# Patient Record
Sex: Female | Born: 1940
Health system: Southern US, Community
[De-identification: ages and names within clinical notes are randomized; demographics above are authoritative.]

## PROBLEM LIST (undated history)

## (undated) DIAGNOSIS — R9089 Other abnormal findings on diagnostic imaging of central nervous system: Secondary | ICD-10-CM

## (undated) DIAGNOSIS — D649 Anemia, unspecified: Secondary | ICD-10-CM

## (undated) DIAGNOSIS — C50919 Malignant neoplasm of unspecified site of unspecified female breast: Secondary | ICD-10-CM

## (undated) DIAGNOSIS — R7309 Other abnormal glucose: Secondary | ICD-10-CM

## (undated) DIAGNOSIS — I10 Essential (primary) hypertension: Secondary | ICD-10-CM

## (undated) DIAGNOSIS — I639 Cerebral infarction, unspecified: Secondary | ICD-10-CM

## (undated) DIAGNOSIS — I714 Abdominal aortic aneurysm, without rupture, unspecified: Secondary | ICD-10-CM

## (undated) DIAGNOSIS — R011 Cardiac murmur, unspecified: Secondary | ICD-10-CM

## (undated) DIAGNOSIS — E785 Hyperlipidemia, unspecified: Secondary | ICD-10-CM

## (undated) DIAGNOSIS — M6281 Muscle weakness (generalized): Secondary | ICD-10-CM

## (undated) DIAGNOSIS — R031 Nonspecific low blood-pressure reading: Secondary | ICD-10-CM

## (undated) DIAGNOSIS — C50911 Malignant neoplasm of unspecified site of right female breast: Secondary | ICD-10-CM

## (undated) HISTORY — DX: Hypocalcemia: E83.51

## (undated) HISTORY — PX: ABDOMINAL AORTIC ANEURYSM REPAIR: SUR1152

## (undated) HISTORY — DX: Anemia, unspecified: D64.9

## (undated) HISTORY — DX: Muscle weakness (generalized): M62.81

## (undated) HISTORY — DX: Cardiac murmur, unspecified: R01.1

## (undated) HISTORY — DX: Other abnormal glucose: R73.09

## (undated) HISTORY — DX: Malignant neoplasm of unspecified site of unspecified female breast: C50.919

## (undated) HISTORY — DX: Essential (primary) hypertension: I10

## (undated) HISTORY — DX: Other abnormal findings on diagnostic imaging of central nervous system: R90.89

## (undated) HISTORY — DX: Malignant neoplasm of unspecified site of right female breast: C50.911

## (undated) HISTORY — DX: Hyperlipidemia, unspecified: E78.5

## (undated) HISTORY — DX: Abdominal aortic aneurysm, without rupture, unspecified: I71.40

## (undated) HISTORY — DX: Nonspecific low blood-pressure reading: R03.1

---

## 2016-12-31 ENCOUNTER — Emergency Department (HOSPITAL_COMMUNITY): Payer: Medicare Other

## 2016-12-31 ENCOUNTER — Inpatient Hospital Stay (HOSPITAL_COMMUNITY)
Admission: EM | Admit: 2016-12-31 | Discharge: 2017-01-02 | DRG: 064 | Disposition: A | Discharge status: Home Health | Payer: Medicare Other | Attending: Internal Medicine | Admitting: Internal Medicine

## 2016-12-31 ENCOUNTER — Encounter (HOSPITAL_COMMUNITY): Payer: Self-pay

## 2016-12-31 ENCOUNTER — Other Ambulatory Visit: Payer: Self-pay

## 2016-12-31 DIAGNOSIS — I361 Nonrheumatic tricuspid (valve) insufficiency: Secondary | ICD-10-CM | POA: Diagnosis not present

## 2016-12-31 DIAGNOSIS — D329 Benign neoplasm of meninges, unspecified: Secondary | ICD-10-CM | POA: Diagnosis not present

## 2016-12-31 DIAGNOSIS — I639 Cerebral infarction, unspecified: Secondary | ICD-10-CM | POA: Diagnosis present

## 2016-12-31 DIAGNOSIS — G936 Cerebral edema: Secondary | ICD-10-CM | POA: Diagnosis present

## 2016-12-31 DIAGNOSIS — E785 Hyperlipidemia, unspecified: Secondary | ICD-10-CM

## 2016-12-31 DIAGNOSIS — I10 Essential (primary) hypertension: Secondary | ICD-10-CM | POA: Diagnosis present

## 2016-12-31 DIAGNOSIS — I6381 Other cerebral infarction due to occlusion or stenosis of small artery: Principal | ICD-10-CM | POA: Diagnosis present

## 2016-12-31 DIAGNOSIS — I63031 Cerebral infarction due to thrombosis of right carotid artery: Secondary | ICD-10-CM | POA: Diagnosis not present

## 2016-12-31 DIAGNOSIS — E876 Hypokalemia: Secondary | ICD-10-CM | POA: Diagnosis present

## 2016-12-31 DIAGNOSIS — R29701 NIHSS score 1: Secondary | ICD-10-CM | POA: Diagnosis present

## 2016-12-31 DIAGNOSIS — L309 Dermatitis, unspecified: Secondary | ICD-10-CM | POA: Diagnosis present

## 2016-12-31 DIAGNOSIS — J302 Other seasonal allergic rhinitis: Secondary | ICD-10-CM | POA: Diagnosis not present

## 2016-12-31 DIAGNOSIS — I611 Nontraumatic intracerebral hemorrhage in hemisphere, cortical: Secondary | ICD-10-CM | POA: Diagnosis present

## 2016-12-31 DIAGNOSIS — Z79899 Other long term (current) drug therapy: Secondary | ICD-10-CM | POA: Diagnosis not present

## 2016-12-31 DIAGNOSIS — F17210 Nicotine dependence, cigarettes, uncomplicated: Secondary | ICD-10-CM | POA: Diagnosis present

## 2016-12-31 DIAGNOSIS — R9089 Other abnormal findings on diagnostic imaging of central nervous system: Secondary | ICD-10-CM

## 2016-12-31 HISTORY — DX: Essential (primary) hypertension: I10

## 2016-12-31 LAB — URINALYSIS, ROUTINE W REFLEX MICROSCOPIC
Bilirubin Urine: NEGATIVE
Glucose, UA: NEGATIVE mg/dL
Hgb urine dipstick: NEGATIVE
Ketones, ur: NEGATIVE mg/dL
Nitrite: NEGATIVE
Protein, ur: NEGATIVE mg/dL
Specific Gravity, Urine: 1.003 — ABNORMAL LOW (ref 1.005–1.030)
pH: 6 (ref 5.0–8.0)

## 2016-12-31 LAB — CBC
HEMATOCRIT: 38.5 % (ref 36.0–46.0)
Hemoglobin: 12.8 g/dL (ref 12.0–15.0)
MCH: 30.2 pg (ref 26.0–34.0)
MCHC: 33.2 g/dL (ref 30.0–36.0)
MCV: 90.8 fL (ref 78.0–100.0)
Platelets: 343 10*3/uL (ref 150–400)
RBC: 4.24 MIL/uL (ref 3.87–5.11)
RDW: 12.9 % (ref 11.5–15.5)
WBC: 6.1 10*3/uL (ref 4.0–10.5)

## 2016-12-31 LAB — BASIC METABOLIC PANEL
Anion gap: 9 (ref 5–15)
BUN: 13 mg/dL (ref 6–20)
CO2: 26 mmol/L (ref 22–32)
Calcium: 8.9 mg/dL (ref 8.9–10.3)
Chloride: 102 mmol/L (ref 101–111)
Creatinine, Ser: 0.68 mg/dL (ref 0.44–1.00)
GFR calc Af Amer: >60 mL/min (ref >60)
GFR calc non Af Amer: >60 mL/min (ref >60)
Glucose, Bld: 107 mg/dL — ABNORMAL HIGH (ref 65–99)
Potassium: 3.5 mmol/L (ref 3.5–5.1)
Sodium: 137 mmol/L (ref 135–145)

## 2016-12-31 LAB — CBC WITH DIFFERENTIAL/PLATELET
Basophils Absolute: 0 10*3/uL (ref 0.0–0.1)
Basophils Relative: 0 %
Eosinophils Absolute: 0.4 10*3/uL (ref 0.0–0.7)
Eosinophils Relative: 6 %
HCT: 43.1 % (ref 36.0–46.0)
Hemoglobin: 14.6 g/dL (ref 12.0–15.0)
Lymphocytes Relative: 19 %
Lymphs Abs: 1.4 10*3/uL (ref 0.7–4.0)
MCH: 31 pg (ref 26.0–34.0)
MCHC: 33.9 g/dL (ref 30.0–36.0)
MCV: 91.5 fL (ref 78.0–100.0)
Monocytes Absolute: 0.6 10*3/uL (ref 0.1–1.0)
Monocytes Relative: 8 %
Neutro Abs: 5 10*3/uL (ref 1.7–7.7)
Neutrophils Relative %: 67 %
Platelets: 369 10*3/uL (ref 150–400)
RBC: 4.71 MIL/uL (ref 3.87–5.11)
RDW: 13.1 % (ref 11.5–15.5)
WBC: 7.5 10*3/uL (ref 4.0–10.5)

## 2016-12-31 LAB — CREATININE, SERUM
CREATININE: 0.63 mg/dL (ref 0.44–1.00)
GFR calc Af Amer: >60 mL/min (ref >60)

## 2016-12-31 MED ORDER — LABETALOL HCL 5 MG/ML IV SOLN
20.0000 mg | Freq: Once | INTRAVENOUS | Status: AC
  Administered 2016-12-31: 20 mg via INTRAVENOUS
  Filled 2016-12-31: qty 4

## 2016-12-31 MED ORDER — STROKE: EARLY STAGES OF RECOVERY BOOK
Freq: Once | Status: AC
  Administered 2016-12-31 (×2)
  Filled 2016-12-31: qty 1

## 2016-12-31 MED ORDER — ASPIRIN 300 MG RE SUPP: 300.0000 mg | Freq: Every day | RECTAL | Status: DC

## 2016-12-31 MED ORDER — ACETAMINOPHEN 160 MG/5ML PO SOLN: 650.0000 mg | ORAL | Status: DC | PRN

## 2016-12-31 MED ORDER — DIPHENHYDRAMINE HCL 50 MG/ML IJ SOLN
25.0000 mg | Freq: Once | INTRAMUSCULAR | Status: AC
  Administered 2016-12-31: 25 mg via INTRAVENOUS
  Filled 2016-12-31: qty 1

## 2016-12-31 MED ORDER — HYDRALAZINE HCL 20 MG/ML IJ SOLN
10.0000 mg | INTRAMUSCULAR | Status: DC | PRN
  Administered 2017-01-01 – 2017-01-02 (×2): 10 mg via INTRAVENOUS
  Filled 2016-12-31 (×2): qty 1

## 2016-12-31 MED ORDER — DIPHENHYDRAMINE HCL 25 MG PO CAPS
25.0000 mg | ORAL_CAPSULE | Freq: Every day | ORAL | Status: DC
  Administered 2016-12-31 – 2017-01-01 (×2): 25 mg via ORAL
  Filled 2016-12-31 (×2): qty 1

## 2016-12-31 MED ORDER — ACETAMINOPHEN 325 MG PO TABS: 650.0000 mg | ORAL_TABLET | ORAL | Status: DC | PRN

## 2016-12-31 MED ORDER — SODIUM CHLORIDE 0.9 % IV SOLN
INTRAVENOUS | Status: DC
  Administered 2016-12-31: 16:00:00 via INTRAVENOUS
  Administered 2017-01-01: 1000 mL via INTRAVENOUS

## 2016-12-31 MED ORDER — ACETAMINOPHEN 650 MG RE SUPP: 650.0000 mg | RECTAL | Status: DC | PRN

## 2016-12-31 MED ORDER — ASPIRIN 325 MG PO TABS
325.0000 mg | ORAL_TABLET | Freq: Every day | ORAL | Status: DC
  Administered 2016-12-31: 325 mg via ORAL
  Filled 2016-12-31: qty 1

## 2016-12-31 MED ORDER — LORATADINE 10 MG PO TABS
10.0000 mg | ORAL_TABLET | Freq: Every day | ORAL | Status: DC
  Administered 2017-01-01 – 2017-01-02 (×2): 10 mg via ORAL
  Filled 2016-12-31 (×2): qty 1

## 2016-12-31 MED ORDER — LORAZEPAM 2 MG/ML IJ SOLN
1.0000 mg | Freq: Once | INTRAMUSCULAR | Status: AC
  Administered 2016-12-31: 1 mg via INTRAVENOUS
  Filled 2016-12-31: qty 1

## 2016-12-31 MED ORDER — GADOBENATE DIMEGLUMINE 529 MG/ML IV SOLN
15.0000 mL | Freq: Once | INTRAVENOUS | Status: AC | PRN
  Administered 2016-12-31: 11 mL via INTRAVENOUS

## 2016-12-31 NOTE — ED Notes (Addendum)
ED Provider at bedside. NOT A CODE STROKE

## 2016-12-31 NOTE — ED Notes (Signed)
[place] room [phone]

## 2016-12-31 NOTE — ED Triage Notes (Addendum)
Patient c/o intermittent numbness of the left side from head to left foot and beeing "off- balanced.". Patient states numbness started Saturday and has been getting progressively worse. Patient called her physician and was told to come to the ED.

## 2016-12-31 NOTE — ED Notes (Signed)
PT STATES LEFT SIDE "I FEEL MORE NOW ON THIS SIDE THAN EARLIER" "EVERYTHING IS LESS DULL" " I CAN ACTUALLY FEEL MY EAR"

## 2016-12-31 NOTE — Consult Note (Addendum)
Requesting Physician: Dr. Wilson Singer    Chief Complaint:  Left side  numbness  History obtained from:   Patient and Chart     HPI:                                                                                                                                       Elizabeth Chase is an 76 y.o. female with PMH of HTN presented with left-sided numbness since yesterday. She presented to [facility] emergency room where CT head showed no area of hemorrhage in the right frontal lobe and a hypodensity in the right thalamus. MRI brain confirmed acute infarct in the right thalamus, measuring 10 mm with possible small acute infarct posterior limb internal capsule on the right, hyperintensity in the subcortical white matter of the right posterior frontal lobe with a small amount of hemorrhage. Initial blood pressure was 979/89 systolic.  Patient was transferred to [facility] for further stroke workup. Neurology was consulted.       Date last known well: 11.18.18 Time last known well:  tPA Given: no, hemorrhage, outiside window  NIHSS: 1 Baseline MRS 0 ICH score 0  Past Medical History:  Diagnosis Date  . Hypertension     History reviewed. No pertinent surgical history.  Family History  Problem Relation Age of Onset  . Diabetes Mother   . Cancer Father    Social History:  reports that she has been smoking cigarettes.  She has been smoking about 1.00 pack per day. she has never used smokeless tobacco. She reports that she does not drink alcohol or use drugs.  Allergies:  Allergies  Allergen Reactions  . Bee Venom     Medications:                                                                                                                        I reviewed home medications.   ROS:  14 systems reviewed and negative except above. 15 pound weight loss  in past few months   Examination:                                                                                                      General: Appears well-developed and well-nourished.  Psych: Affect appropriate to situation Eyes: No scleral injection HENT: No OP obstrucion Head: Normocephalic.  Cardiovascular: Normal rate and regular rhythm.  Respiratory: Effort normal and breath sounds normal to anterior ascultation GI: Soft.  No distension. There is no tenderness.  Skin: WDI   Neurological Examination Mental Status: Alert, oriented, thought content appropriate.  Speech fluent without evidence of aphasia.  Able to follow 3 step commands without difficulty. Cranial Nerves: II: Discs flat bilaterally; Visual fields grossly normal,  III,IV, VI: ptosis not present, extra-ocular motions intact bilaterally, pupils equal, round, reactive to light and accommodation V,VII: smile symmetric, facial light touch sensation normal bilaterally VIII: hearing normal bilaterally IX,X: uvula rises symmetrically XI: bilateral shoulder shrug XII: midline tongue extension Motor: Right : Upper extremity   5/5    Left:     Upper extremity   5/5  Lower extremity   5/5     Lower extremity   5/5 Tone and bulk:normal tone throughout; no atrophy noted Sensory: reduced sensation to light touch, pinprick on left face arm and leg Deep Tendon Reflexes: 2+ and symmetric throughout Plantars: Right: downgoing   Left: downgoing Cerebellar: normal finger-to-nose, normal rapid alternating movements and normal heel-to-shin test Gait: normal gait and station     Lab Results: Basic Metabolic Panel: Recent Labs  Lab 12/31/16 0907 12/31/16 1847  NA 137  --   K 3.5  --   CL 102  --   CO2 26  --   GLUCOSE 107*  --   BUN 13  --   CREATININE 0.68 0.63  CALCIUM 8.9  --     CBC: Recent Labs  Lab 12/31/16 0907 12/31/16 1847  WBC 7.5 6.1  NEUTROABS 5.0  --   HGB 14.6 12.8  HCT 43.1 38.5  MCV 91.5  90.8  PLT 369 343    Coagulation Studies: No results for input(s): LABPROT, INR in the last 72 hours.  Imaging: Ct Head Wo Contrast  Result Date: 12/31/2016 CLINICAL DATA:  Intermittent left side numbness EXAM: CT HEAD WITHOUT CONTRAST TECHNIQUE: Contiguous axial images were obtained from the base of the skull through the vertex without intravenous contrast. COMPARISON:  None. FINDINGS: Brain: Small hyperdense area noted in the right frontal deep white matter measuring 5 mm concerning for small area of hemorrhage. This is in an area of slight low-density in the deep white matter, possibly petechial hemorrhage within an area of infarct. Low-density areas also noted in the right thalamus compatible with lacunar infarcts. No mass effect or midline shift. No hydrocephalus. Vascular: No hyperdense vessel or unexpected calcification. Skull: No acute calvarial abnormality. Sinuses/Orbits: Visualized paranasal sinuses and mastoids clear. Orbital soft tissues unremarkable. Other: None IMPRESSION: 5 mm hyperdense area within the right frontal white matter concerning for  small petechial hemorrhage, possibly within a small area white matter infarct. Lacunar infarcts also noted in the right thalamus. MRI may be beneficial for further evaluation if felt clinically indicated. These results were called by telephone at the time of interpretation on 12/31/2016 at 9:29 am to Dr. Virgel Manifold , who verbally acknowledged these results. Electronically Signed   By: Rolm Baptise M.D.   On: 12/31/2016 09:32   Mr Brain W And Wo Contrast  Result Date: 12/31/2016 CLINICAL DATA:  Numbness left side EXAM: MRI HEAD WITHOUT AND WITH CONTRAST TECHNIQUE: Multiplanar, multiecho pulse sequences of the brain and surrounding structures were obtained without and with intravenous contrast. CONTRAST:  75mL MULTIHANCE GADOBENATE DIMEGLUMINE 529 MG/ML IV SOLN COMPARISON:  CT head 12/31/2016 FINDINGS: Brain: Acute infarct right thalamus  measuring 10 mm. Possible small acute infarct posterior limb internal capsule on the right Postcontrast imaging is markedly degraded by motion due to claustrophobia. 8 x 10 mm extra-axial enhancing mass in the right parietal region consistent with small meningioma. Hyperintensity in the subcortical white matter of the right posterior frontal lobe with a small amount of hemorrhage. This shows patchy enhancement on the postcontrast sagittal images but not confirmed on other images likely due to motion. This corresponds to the area of hemorrhage on CT. The white matter hyperintensity may be vasogenic edema. No other areas of abnormal enhancement. Generalized atrophy.  Negative for hydrocephalus. Vascular: Normal arterial flow voids Skull and upper cervical spine: Negative Sinuses/Orbits: Mild mucosal edema paranasal sinuses.  Normal orbit Other: None IMPRESSION: Acute infarct right thalamus. Possible small acute infarct posterior limb internal capsule on the right 8 x 10 mm extra-axial enhancing mass right parietal convexity compatible with small meningioma Hyperintensity in the subcortical white matter in the right posterior frontal lobe. This contains hemorrhage on CT and mild enhancement. This could be due to acute hemorrhagic infarction. Metastatic disease possible. Short term follow-up MRI suggested Image quality degraded by motion. There is extensive motion on the postcontrast images. Electronically Signed   By: Franchot Gallo M.D.   On: 12/31/2016 13:15     ASSESSMENT AND PLAN  76 y.o. female with PMH of HTN presented with left-sided numbness. She was found to have  acute infarct in the right thalamus, measuring 10 mm with possible small acute infarct posterior limb internal capsule on the right, hyperintensity in the subcortical white matter of the right posterior frontal lobe with a small amount of hemorrhage.   Acute Ischemic Right thalamic infarct Right frontal Hemorrhage  Recommend # MRI of the  brain with contrast to evaluate for underlying neoplasm given hemorrhage  #MRA Head, carotid US #Transthoracic Echo  # Hold Antiplatelets due to hemorrhage  # Blood cx to rule out endocarditis #Start or continue Atorvastatin 80 mg/other high intensity statin # BP goal: 160 SBP and below  # HBAIC and Lipid profile # Telemetry monitoring # Frequent neuro checks # NPO until passes stroke swallow screen  Please page stroke NP  Or  PA  Or MD from 8am -4 pm  as this patient from this time will be  followed by the stroke.   You can look them up on [email or web address]  Password [facility]   Lunden Mcleish [facility] Pager Number [phone]

## 2016-12-31 NOTE — ED Triage Notes (Signed)
Lisinopril 10mg  started 1 week. Increased this week to 20 mg for increased BP. Pt with generalized red rash. Pt states -Treated for ? scabies in the last two weeks. Numbness started in left palm and increased to left side. Symptoms started Saturday.

## 2016-12-31 NOTE — ED Notes (Addendum)
EDP KOHUT PRESENT WITH PT. AWARE OF STROKE AND ADMISSION TO [facility]. PT TO DISCUSS NEED FOR ANYTHING (MED) BEFORE MRI.

## 2016-12-31 NOTE — ED Notes (Signed)
Attempted to call report to 3W at [facility]. Nurse unable to take report at this time.

## 2016-12-31 NOTE — ED Notes (Signed)
Patient transported to MRI 

## 2016-12-31 NOTE — ED Notes (Signed)
[place] room [phone]

## 2016-12-31 NOTE — ED Notes (Addendum)
ADMISSION MD PAGED. PT PASSED STROKE SWALLOW- AWAITING RESPONSE FOR DIET. AND AWAITING BED AT [facility]. NOTIFIED PHARMACY FOR STROKE PACKAGE

## 2016-12-31 NOTE — ED Notes (Addendum)
CHAR[facility]ACE[facility] RN CALLING HOUSE ACRN INQUIRING BED ASSIGNMENT

## 2016-12-31 NOTE — ED Notes (Signed)
Attempted to call report to [facility] x2

## 2016-12-31 NOTE — ED Provider Notes (Signed)
[place] DEPT Provider Note   CSN: [id number] Arrival date & time: 12/31/16  7591     History   Chief Complaint Chief Complaint  Patient presents with  . Numbness    HPI Elizabeth Chase is a 76 y.o. female.  HPI   76 year old female with left-sided numbness.  On Saturday.  She initially noticed that her left hand felt numb.  This quickly progressed to her entire left side.  She feels numb from the left side of her face only down into her left foot.  This is been relatively stable.  Her daughter feels that she seems a little bit off balance when she walks.  She denies any acute pain.  No visual complaints.  No change in speech.  No weakness.  She was recently started on lisinopril for hypertension after he daughter encouraged her to see someone after going years w/o care.   Past Medical History:  Diagnosis Date  . Hypertension     There are no active problems to display for this patient.   History reviewed. No pertinent surgical history.  OB History    No data available       Home Medications    Prior to Admission medications   Not on File    Family History Family History  Problem Relation Age of Onset  . Diabetes Mother   . Cancer Father     Social History Social History   Tobacco Use  . Smoking status: Current Every Day Smoker    Packs/day: 1.00    Types: Cigarettes  . Smokeless tobacco: Never Used  Substance Use Topics  . Alcohol use: No    Frequency: Never  . Drug use: No     Allergies   Bee venom   Review of Systems Review of Systems  All systems reviewed and negative, other than as noted in HPI.  Physical Exam Updated Vital Signs BP (!) 202/90 (BP Location: Left Arm)   Pulse 88   Temp 98 F (36.7 C) (Oral)   Resp (!) 22   Ht 5\' 5"  (1.651 m)   Wt 52.2 kg (115 lb)   SpO2 98%   BMI 19.14 kg/m   Physical Exam  Constitutional: She is oriented to person, place, and time. She appears well-developed  and well-nourished. No distress.  HENT:  Head: Normocephalic and atraumatic.  Eyes: Conjunctivae are normal. Right eye exhibits no discharge. Left eye exhibits no discharge.  Neck: Neck supple.  Cardiovascular: Normal rate, regular rhythm and normal heart sounds. Exam reveals no gallop and no friction rub.  No murmur heard. Pulmonary/Chest: Effort normal and breath sounds normal. No respiratory distress.  Abdominal: Soft. She exhibits no distension. There is no tenderness.  Musculoskeletal: She exhibits no edema or tenderness.  Neurological: She is alert and oriented to person, place, and time.  Speech clear.  Content appropriate.  Follows commands.  Cranial nerves II through XII are intact.  Strength is 5 out of 5 bilateral upper and lower extremities.  Good finger to nose testing bilaterally.  She is able to stand at bedside with her feet together without apparent difficulty.  Did not ambulate.  Decreased sensation to light touch left face upper extremity, trunk and left lower extremity.  Skin: Skin is warm and dry.  Psychiatric: She has a normal mood and affect. Her behavior is normal. Thought content normal.  Nursing note and vitals reviewed.    ED Treatments / Results  Labs (all labs ordered  are listed, but only abnormal results are displayed) Labs Reviewed  CBC WITH DIFFERENTIAL/PLATELET  BASIC METABOLIC PANEL  URINALYSIS, ROUTINE W REFLEX MICROSCOPIC    EKG  EKG Interpretation  Date/Time:  Monday December 31 2016 08:56:37 EST Ventricular Rate:  86 PR Interval:    QRS Duration: 90 QT Interval:  399 QTC Calculation: 478 R Axis:   -24 Text Interpretation:  Sinus rhythm Atrial premature complex No old tracing to compare Confirmed by Virgel Manifold [phone]) on 12/31/2016 9:06:02 AM       Radiology Ct Head Wo Contrast  Result Date: 12/31/2016 CLINICAL DATA:  Intermittent left side numbness EXAM: CT HEAD WITHOUT CONTRAST TECHNIQUE: Contiguous axial images were obtained  from the base of the skull through the vertex without intravenous contrast. COMPARISON:  None. FINDINGS: Brain: Small hyperdense area noted in the right frontal deep white matter measuring 5 mm concerning for small area of hemorrhage. This is in an area of slight low-density in the deep white matter, possibly petechial hemorrhage within an area of infarct. Low-density areas also noted in the right thalamus compatible with lacunar infarcts. No mass effect or midline shift. No hydrocephalus. Vascular: No hyperdense vessel or unexpected calcification. Skull: No acute calvarial abnormality. Sinuses/Orbits: Visualized paranasal sinuses and mastoids clear. Orbital soft tissues unremarkable. Other: None IMPRESSION: 5 mm hyperdense area within the right frontal white matter concerning for small petechial hemorrhage, possibly within a small area white matter infarct. Lacunar infarcts also noted in the right thalamus. MRI may be beneficial for further evaluation if felt clinically indicated. These results were called by telephone at the time of interpretation on 12/31/2016 at 9:29 am to Dr. Virgel Manifold , who verbally acknowledged these results. Electronically Signed   By: Rolm Baptise M.D.   On: 12/31/2016 09:32    Procedures Procedures (including critical care time)  Medications Ordered in ED Medications - No data to display   Initial Impression / Assessment and Plan / ED Course  I have reviewed the triage vital signs and the nursing notes.  Pertinent labs & imaging results that were available during my care of the patient were reviewed by me and considered in my medical decision making (see chart for details).     Imaging as above. Aspirin deferred because of petechial hemorrhage. Discussed with neurology, MRI with contrast with possibility of malignancy. [facility]   Final Clinical Impressions(s) / ED Diagnoses   Final diagnoses:  Right thalamic infarction [facility])    ED Discharge  Orders    None       Virgel Manifold, MD 01/02/17 [phone]

## 2016-12-31 NOTE — H&P (Signed)
History and Physical    Elizabeth Chase UMP:[id number] DOB: Sep 16, 1940 DOA: 12/31/2016  PCP: System, Pcp Not In   Patient coming from: Home  Chief Complaint: Left sided numbness.  HPI: Elizabeth Chase is a 76 y.o. female with medical history significant of hypertension who presented with left sided numbness. 24 hours ago she felt numbness on her left hand, radiated into her left upper extremity, she went to sleep with this symptoms and by the time she woke up she had complete numbness on her left side including left upper and left lower extremities. The numbness was persistent, severe in intensity, no improving or worsening factors, associated with difficulty ambulating but not weakness. She presented to the hospital for further evaluation.   ED Course: She was workup with head CT which showed acute stroke, neurology was contacted, recommendations to transfer to [facility] for further evaluation.  Review of Systems:  1. General: No fevers, no chills, no weight gain or weight loss 2. ENT: No runny nose or sore throat, no hearing disturbances 3. Pulmonary: No dyspnea, cough, wheezing, or hemoptysis 4. Cardiovascular: No angina, claudication, lower extremity edema, pnd or orthopnea 5. Gastrointestinal: No nausea or vomiting, no diarrhea or constipation 6. Hematology: No easy bruisability or frequent infections 7. Urology: No dysuria, hematuria or increased urinary frequency 8. Dermatology: No rashes. 9. Neurology: No seizures, positive for numbness in the left side and ambulatory dysfunction 10. Musculoskeletal: No joint pain or deformities  Past Medical History:  Diagnosis Date  . Hypertension     History reviewed. No pertinent surgical history.   reports that she has been smoking cigarettes.  She has been smoking about 1.00 pack per day. she has never used smokeless tobacco. She reports that she does not drink alcohol or use drugs.  Allergies  Allergen Reactions  . Bee Venom      Family History  Problem Relation Age of Onset  . Diabetes Mother   . Cancer Father      Prior to Admission medications   Medication Sig Start Date End Date Taking? Authorizing Provider  cetirizine (ZYRTEC) 10 MG tablet Take 10 mg daily by mouth.   Yes [provider]  diphenhydrAMINE (BENADRYL) 25 MG tablet Take 25 mg at bedtime by mouth.   Yes [provider]  lisinopril (PRINIVIL,ZESTRIL) 20 MG tablet Take 20 mg daily by mouth.   Yes [provider]  mometasone (ELOCON) 0.1 % cream Apply 1 application daily as needed topically (itching).   Yes [provider]    Physical Exam: Vitals:   12/31/16 1113 12/31/16 1117 12/31/16 1131 12/31/16 1149  BP: (!) 161/64 (!) 141/59  (!) 170/70  Pulse: (!) 59 68  64  Resp: (!) 27 (!) 23  20  Temp:   97.9 F (36.6 C)   TempSrc:      SpO2: 95% 100%  98%  Weight:      Height:        Constitutional: deconditioned Vitals:   12/31/16 1113 12/31/16 1117 12/31/16 1131 12/31/16 1149  BP: (!) 161/64 (!) 141/59  (!) 170/70  Pulse: (!) 59 68  64  Resp: (!) 27 (!) 23  20  Temp:   97.9 F (36.6 C)   TempSrc:      SpO2: 95% 100%  98%  Weight:      Height:       Eyes: PERRL, lids and conjunctivae pale. ENMT: Mucous membranes are dry. Posterior pharynx clear of any exudate or lesions.Normal  dentition.  Neck: normal, supple, no masses, no thyromegaly Respiratory: clear to auscultation bilaterally, no wheezing, no crackles. Normal respiratory effort. No accessory muscle use.  Cardiovascular: Regular rate and rhythm, no murmurs / rubs / gallops. No extremity edema. 2+ pedal pulses. No carotid bruits.  Abdomen: no tenderness, no masses palpated. No hepatosplenomegaly. Bowel sounds positive.  Musculoskeletal: no clubbing / cyanosis. No joint deformity upper and lower extremities. Good ROM, no contractures. Normal muscle tone.  Skin: no rashes, lesions, ulcers. No induration Neurologic: CN 2-12 grossly intact.  Sensation intact, DTR normal. Strength 5/5 in all 4. Reported numbness on the left side, other sensation is intact.    Labs on Admission: I have personally reviewed following labs and imaging studies  CBC: Recent Labs  Lab 12/31/16 0907  WBC 7.5  NEUTROABS 5.0  HGB 14.6  HCT 43.1  MCV 91.5  PLT 063   Basic Metabolic Panel: Recent Labs  Lab 12/31/16 0907  NA 137  K 3.5  CL 102  CO2 26  GLUCOSE 107*  BUN 13  CREATININE 0.68  CALCIUM 8.9   GFR: Estimated Creatinine Clearance: 48.5 mL/min (by C-G formula based on SCr of 0.68 mg/dL). Liver Function Tests: No results for input(s): AST, ALT, ALKPHOS, BILITOT, PROT, ALBUMIN in the last 168 hours. No results for input(s): LIPASE, AMYLASE in the last 168 hours. No results for input(s): AMMONIA in the last 168 hours. Coagulation Profile: No results for input(s): INR, PROTIME in the last 168 hours. Cardiac Enzymes: No results for input(s): CKTOTAL, CKMB, CKMBINDEX, TROPONINI in the last 168 hours. BNP (last 3 results) No results for input(s): PROBNP in the last 8760 hours. HbA1C: No results for input(s): HGBA1C in the last 72 hours. CBG: No results for input(s): GLUCAP in the last 168 hours. Lipid Profile: No results for input(s): CHOL, HDL, LDLCALC, TRIG, CHOLHDL, LDLDIRECT in the last 72 hours. Thyroid Function Tests: No results for input(s): TSH, T4TOTAL, FREET4, T3FREE, THYROIDAB in the last 72 hours. Anemia Panel: No results for input(s): VITAMINB12, FOLATE, FERRITIN, TIBC, IRON, RETICCTPCT in the last 72 hours. Urine analysis:    Component Value Date/Time   COLORURINE STRAW (A) 12/31/2016 0907   APPEARANCEUR CLEAR 12/31/2016 0907   LABSPEC 1.003 (L) 12/31/2016 0907   PHURINE 6.0 12/31/2016 0907   GLUCOSEU NEGATIVE 12/31/2016 0907   HGBUR NEGATIVE 12/31/2016 0907   BILIRUBINUR NEGATIVE 12/31/2016 0907   KETONESUR NEGATIVE 12/31/2016 0907   PROTEINUR NEGATIVE 12/31/2016 0907   NITRITE NEGATIVE 12/31/2016 0907    LEUKOCYTESUR LARGE (A) 12/31/2016 0907    Radiological Exams on Admission: Ct Head Wo Contrast  Result Date: 12/31/2016 CLINICAL DATA:  Intermittent left side numbness EXAM: CT HEAD WITHOUT CONTRAST TECHNIQUE: Contiguous axial images were obtained from the base of the skull through the vertex without intravenous contrast. COMPARISON:  None. FINDINGS: Brain: Small hyperdense area noted in the right frontal deep white matter measuring 5 mm concerning for small area of hemorrhage. This is in an area of slight low-density in the deep white matter, possibly petechial hemorrhage within an area of infarct. Low-density areas also noted in the right thalamus compatible with lacunar infarcts. No mass effect or midline shift. No hydrocephalus. Vascular: No hyperdense vessel or unexpected calcification. Skull: No acute calvarial abnormality. Sinuses/Orbits: Visualized paranasal sinuses and mastoids clear. Orbital soft tissues unremarkable. Other: None IMPRESSION: 5 mm hyperdense area within the right frontal white matter concerning for small petechial hemorrhage, possibly within a small area white matter infarct. Lacunar infarcts also noted  in the right thalamus. MRI may be beneficial for further evaluation if felt clinically indicated. These results were called by telephone at the time of interpretation on 12/31/2016 at 9:29 am to Dr. Virgel Manifold , who verbally acknowledged these results. Electronically Signed   By: Rolm Baptise M.D.   On: 12/31/2016 09:32    EKG: Independently reviewed. Normal sinus rhythm, rate 86 bpm, normal axis, normal intervals, positive premature atrial complexes.   Assessment/Plan Active Problems:   * No active hospital problems. *   76 year old female who presented with acute focal neurologic deficit consistent with partial paresthesias on her left side, associated with ambulatory dysfunction. On initial physical examination blood pressure 170/70, heart rate 64, respiratory 20,  oxygen saturation 98% on room air, lungs are clear to auscultation bilaterally, heart S1-S2 present and rhythmic, the abdomen is soft nontender, no lower extremity edema. Sodium 137, potassium 3.5, chloride 102, bicarbonate 26, glucose 107, BUN 13, creatinine 0.68, white count 7.5, hemoglobin 14.6, hematocrit 43.1, platelets 369. Uterine analysis negative for infection. Head CT with 5 mm hyperdense area within the right frontal white matter concerning for small petechial hemorrhage, possibly within a small area white matter infarct. Lacunar infarcts noted in the right thalamus. MRI with acute infarct in the right thalamus, measuring 10 mm with possible small acute infarct posterior limb internal capsule on the right, hyperintensity in the subcortical white matter of the right posterior frontal lobe with a small amount of hemorrhage.    Working diagnosis left side paresthesias due to acute CVA with possible hemorrhagic complication.   1. Acute CVA of the right thalamus, complicated by right posterior frontal lobe hyperintensity consistent with small amount of hemorrhage. Patient will be transferred to [facility], she will be placed on a telemetry monitor, will hold any antiplatelet therapy or anticoagulants considering hemorrhagic features on imaging. Neurochecks every 4 hours, physical therapy evaluation, carotid ultrasonography. Case discussed with Dr. Cheral Marker, who recommends transthoracic echocardiography and blood cultures, to rule out embolic phenomena, including infectious process.  2. Hypertension. Patient received labetalol in the emergency department, will hold further antihypertensive agents unless blood pressure greater than 841 systolic in the setting of acute hemorrhagic CVA. Use as needed hydralazine.  3.  Seasonal allergies. Will continue Benadryl and cetrizine.   DVT prophylaxis: scd Code Status: full  Family Communication: I spoke with patient's family at bedside and all questions were  addressed.  Disposition Plan: home  Consults called: Neurology   Admission status: Inpatient.     Morton Simson Gerome Apley MD [facility] Pager [phone]  If 7PM-7AM, please contact night-coverage [email or web address] Password [email or web address]  12/31/2016, 12:32 PM

## 2016-12-31 NOTE — ED Notes (Signed)
[facility] called for transport to [facility]

## 2017-01-01 ENCOUNTER — Inpatient Hospital Stay (HOSPITAL_COMMUNITY): Payer: Medicare Other

## 2017-01-01 DIAGNOSIS — I10 Essential (primary) hypertension: Secondary | ICD-10-CM

## 2017-01-01 DIAGNOSIS — I639 Cerebral infarction, unspecified: Secondary | ICD-10-CM

## 2017-01-01 DIAGNOSIS — I6381 Other cerebral infarction due to occlusion or stenosis of small artery: Secondary | ICD-10-CM | POA: Diagnosis present

## 2017-01-01 HISTORY — DX: Cerebral infarction, unspecified: I63.9

## 2017-01-01 HISTORY — DX: Other cerebral infarction due to occlusion or stenosis of small artery: I63.81

## 2017-01-01 LAB — COMPREHENSIVE METABOLIC PANEL
ALBUMIN: 3.7 g/dL (ref 3.5–5.0)
ALK PHOS: 53 U/L (ref 38–126)
ALT: 12 U/L — ABNORMAL LOW (ref 14–54)
ANION GAP: 5 (ref 5–15)
AST: 19 U/L (ref 15–41)
BUN: 5 mg/dL — ABNORMAL LOW (ref 6–20)
CALCIUM: 8.8 mg/dL — AB (ref 8.9–10.3)
CO2: 29 mmol/L (ref 22–32)
Chloride: 107 mmol/L (ref 101–111)
Creatinine, Ser: 0.66 mg/dL (ref 0.44–1.00)
GFR calc Af Amer: >60 mL/min (ref >60)
GFR calc non Af Amer: >60 mL/min (ref >60)
GLUCOSE: 127 mg/dL — AB (ref 65–99)
POTASSIUM: 3.8 mmol/L (ref 3.5–5.1)
Sodium: 141 mmol/L (ref 135–145)
Total Bilirubin: 0.6 mg/dL (ref 0.3–1.2)
Total Protein: 6.7 g/dL (ref 6.5–8.1)

## 2017-01-01 MED ORDER — ATORVASTATIN CALCIUM 80 MG PO TABS
80.0000 mg | ORAL_TABLET | Freq: Every day | ORAL | Status: DC
  Administered 2017-01-01: 80 mg via ORAL
  Filled 2017-01-01: qty 1

## 2017-01-01 MED ORDER — LABETALOL HCL 5 MG/ML IV SOLN: 10.0000 mg | INTRAVENOUS | Status: DC | PRN

## 2017-01-01 MED ORDER — ASPIRIN EC 325 MG PO TBEC
325.0000 mg | DELAYED_RELEASE_TABLET | Freq: Every day | ORAL | Status: DC
  Administered 2017-01-02: 325 mg via ORAL
  Filled 2017-01-01: qty 1

## 2017-01-01 NOTE — Progress Notes (Signed)
Pt has rash on skin that looks like scabies rash, nurse called infection control, pt placed on orange contact for 24 hrs, MD notified.

## 2017-01-01 NOTE — Progress Notes (Signed)
PROGRESS NOTE  Elizabeth Chase HYW:[id number] DOB: 05-Jun-1940 DOA: 12/31/2016 PCP: System, Pcp Not In  HPI/Recap of past 24 hours: HPI from Dr Sander Radon on 11/19 Elizabeth Chase is a 76 y.o. female with medical history significant of hypertension who presented to [facility] ER with left sided numbness x 1 day on 11/18. Pt felt numb on her left hand, radiated into her left upper extremity, she went to sleep with this symptoms and by the time she woke up she had complete numbness on her left side including left upper and left lower extremities. The numbness was persistent, severe in intensity, no improving or worsening factors, associated with difficulty holding on to things, but not weakness. MRI was done, which showed acute R thalamic infarct complicated by small amount of hemorrhage. Initial BP 170/70. Pt admitted for further management here at [facility].  Today, pt reported feeling well, denied any new complaints. LUE still feels numb and some tingling. Pt denied any chest pain, SOB, abdominal pain, headache, N/V/D/C, fever/chills.  Assessment/Plan: Principal Problem:   Right thalamic infarction [facility])  # Acute CVA of the right thalamus, complicated by right posterior frontal lobe hyperintensity consistent with small amount of hemorrhage LUE numbness and tingling MRI brain/MRA head noted as below MRI head with contrast pending ECHO: pending B/L carotid US: pending Blood cx to r/o endocarditis pending A1c, lipid panel pending Permissive HTN, hold ASA due to hemorrhage on imaging Started lipitor Neurochecks every 4 hours PT/OT/Speech on board Monitor closely  # Right parietal Meningioma- 8X10 mm extra-axial enhancing mass ?? Mets Follow up MRI   # Hypertension Stable Permissive HTN, held home lisinopril IV hydralazine PRN  #Tobacco abuse Smoking cessation advised Nicotine patch given  #Seasonal allergies Continue Benadryl and cetrizine.   Code Status: Full    Family Communication: With patient and son   Disposition Plan: Home once stable   Consultants:  Neurology  Stroke team  Procedures:  None  Antimicrobials:  None  DVT prophylaxis:  SCDs   Objective: Vitals:   12/31/16 2100 12/31/16 2201 01/01/17 0524 01/01/17 1629  BP: (!) 158/70 (!) 196/78 (!) 154/87 (!) 188/82  Pulse: 77 78 81 85  Resp: (!) 31 20 20 19   Temp:  98.3 F (36.8 C) 97.8 F (36.6 C) 98.1 F (36.7 C)  TempSrc:  Oral Oral Oral  SpO2: 96% 96% 97% 98%  Weight:  52.8 kg (116 lb 6.5 oz)    Height:  5\' 5"  (1.651 m)      Intake/Output Summary (Last 24 hours) at 01/01/2017 1716 Last data filed at 01/01/2017 0522 Gross per 24 hour  Intake 830 ml  Output 400 ml  Net 430 ml   Filed Weights   12/31/16 0842 12/31/16 2201  Weight: 52.2 kg (115 lb) 52.8 kg (116 lb 6.5 oz)    Exam:   General:  Awake, alert, oriented X3  Cardiovascular: S1-S2 present, no added hrt sound  Respiratory: Chest clear bilaterally  Abdomen: Soft, non-tender, non-distended, BS present  Musculoskeletal: No bilateral pedal edema present   Neuro: Reduced sensation to light touch on LUE, motor 5/5 on all extremities, speech fluent  Psychiatry: Normal mood  Skin: rash noted on skin, ??scabies    Data Reviewed: CBC: Recent Labs  Lab 12/31/16 0907 12/31/16 1847  WBC 7.5 6.1  NEUTROABS 5.0  --   HGB 14.6 12.8  HCT 43.1 38.5  MCV 91.5 90.8  PLT 369 485   Basic Metabolic Panel: Recent Labs  Lab 12/31/16 0907 12/31/16 1847 01/01/17 0850  NA 137  --  141  K 3.5  --  3.8  CL 102  --  107  CO2 26  --  29  GLUCOSE 107*  --  127*  BUN 13  --  5*  CREATININE 0.68 0.63 0.66  CALCIUM 8.9  --  8.8*   GFR: Estimated Creatinine Clearance: 49.1 mL/min (by C-G formula based on SCr of 0.66 mg/dL). Liver Function Tests: Recent Labs  Lab 01/01/17 0850  AST 19  ALT 12*  ALKPHOS 53  BILITOT 0.6  PROT 6.7  ALBUMIN 3.7   No results for input(s): LIPASE, AMYLASE  in the last 168 hours. No results for input(s): AMMONIA in the last 168 hours. Coagulation Profile: No results for input(s): INR, PROTIME in the last 168 hours. Cardiac Enzymes: No results for input(s): CKTOTAL, CKMB, CKMBINDEX, TROPONINI in the last 168 hours. BNP (last 3 results) No results for input(s): PROBNP in the last 8760 hours. HbA1C: No results for input(s): HGBA1C in the last 72 hours. CBG: No results for input(s): GLUCAP in the last 168 hours. Lipid Profile: No results for input(s): CHOL, HDL, LDLCALC, TRIG, CHOLHDL, LDLDIRECT in the last 72 hours. Thyroid Function Tests: No results for input(s): TSH, T4TOTAL, FREET4, T3FREE, THYROIDAB in the last 72 hours. Anemia Panel: No results for input(s): VITAMINB12, FOLATE, FERRITIN, TIBC, IRON, RETICCTPCT in the last 72 hours. Urine analysis:    Component Value Date/Time   COLORURINE STRAW (A) 12/31/2016 0907   APPEARANCEUR CLEAR 12/31/2016 0907   LABSPEC 1.003 (L) 12/31/2016 0907   PHURINE 6.0 12/31/2016 0907   GLUCOSEU NEGATIVE 12/31/2016 0907   HGBUR NEGATIVE 12/31/2016 0907   BILIRUBINUR NEGATIVE 12/31/2016 [place] 12/31/2016 0907   PROTEINUR NEGATIVE 12/31/2016 0907   NITRITE NEGATIVE 12/31/2016 0907   LEUKOCYTESUR LARGE (A) 12/31/2016 0907   Sepsis Labs: @LABRCNTIP (procalcitonin:4,lacticidven:4)  ) Recent Results (from the past 240 hour(s))  Blood culture (routine x 2)     Status: None (Preliminary result)   Collection Time: 12/31/16 11:36 AM  Result Value Ref Range Status   Specimen Description BLOOD RIGHT FOREARM  Final   Special Requests   Final    BOTTLES DRAWN AEROBIC AND ANAEROBIC Blood Culture adequate volume   Culture   Final    NO GROWTH < 24 HOURS Performed at [facility], [place] [address], [place], [place] [address]    Report Status PENDING  Incomplete  Blood culture (routine x 2)     Status: None (Preliminary result)   Collection Time: 12/31/16 11:36 AM  Result Value Ref  Range Status   Specimen Description BLOOD LEFT ARM  Final   Special Requests   Final    BOTTLES DRAWN AEROBIC AND ANAEROBIC Blood Culture adequate volume   Culture   Final    NO GROWTH < 24 HOURS Performed at [facility], [place] [address], [place], [place] [address]    Report Status PENDING  Incomplete      Studies: Mr Virgel Paling TK Contrast  Result Date: 01/01/2017 CLINICAL DATA:  Stroke follow-up EXAM: MRA HEAD WITHOUT CONTRAST TECHNIQUE: Angiographic images of the Circle of Willis were obtained using MRA technique without intravenous contrast. COMPARISON:  Brain MRI yesterday. FINDINGS: Mild left vertebral artery dominance. Dominant right PICA and left AICA. Small outpouching at the supraclinoid segment left ICA is conical and likely an infundibulum with associated vessel below resolution of MRA. There is a contralateral posterior  communicating artery infundibulum that is also small. No suspected aneurysm. No branch occlusion or beading. IMPRESSION: Negative exam. No stenosis, beading, or embolic source to explain acute infarct. Electronically Signed   By: Monte Fantasia M.D.   On: 01/01/2017 10:50    Scheduled Meds: . atorvastatin  80 mg Oral q1800  . diphenhydrAMINE  25 mg Oral QHS  . loratadine  10 mg Oral Daily    Continuous Infusions: . sodium chloride 1,000 mL (01/01/17 0242)     LOS: 1 day     Alma Friendly, MD [facility]   If 7PM-7AM, please contact night-coverage [email or web address] Password [facility] 01/01/2017, 5:16 PM

## 2017-01-01 NOTE — Progress Notes (Addendum)
STROKE TEAM PROGRESS NOTE   SUBJECTIVE (INTERVAL HISTORY)  Son is at the bedside. Patient is found laying in bed in NAD  Overall she feels her condition is gradually improving. States the numbness in her face has resolved and continues to have mild "tingling" in her Left arm. Voices no new complaints. No new events reported overnight.  OBJECTIVE Lab Results: CBC:  Recent Labs  Lab 12/31/16 0907 12/31/16 1847  WBC 7.5 6.1  HGB 14.6 12.8  HCT 43.1 38.5  MCV 91.5 90.8  PLT 369 343   BMP: Recent Labs  Lab 12/31/16 0907 12/31/16 1847  NA 137  --   K 3.5  --   CL 102  --   CO2 26  --   GLUCOSE 107*  --   BUN 13  --   CREATININE 0.68 0.63  CALCIUM 8.9  --    Urinalysis:  Recent Labs  Lab 12/31/16 0907  COLORURINE STRAW*  APPEARANCEUR CLEAR  LABSPEC 1.003*  PHURINE 6.0  GLUCOSEU NEGATIVE  HGBUR NEGATIVE  BILIRUBINUR NEGATIVE  KETONESUR NEGATIVE  PROTEINUR NEGATIVE  NITRITE NEGATIVE  LEUKOCYTESUR LARGE*   PHYSICAL EXAM Temp:  [97.3 F (36.3 C)-98.3 F (36.8 C)] 97.8 F (36.6 C) (11/20 0524) Pulse Rate:  [59-88] 81 (11/20 0524) Resp:  [15-31] 20 (11/20 0524) BP: (124-202)/(59-104) 154/87 (11/20 0524) SpO2:  [91 %-100 %] 97 % (11/20 0524) Weight:  [52.2 kg (115 lb)-52.8 kg (116 lb 6.5 oz)] 52.8 kg (116 lb 6.5 oz) (11/19 2201) General - Well nourished, well developed, in no apparent distress Respiratory - Lungs clear bilaterally. No wheezing. Cardiovascular - Regular rate and rhythm   Neurological Examination Mental Status: Alert, oriented, thought content appropriate.  Speech fluent without evidence of aphasia.  Able to follow 3 step commands without difficulty. Cranial Nerves: II: Discs flat bilaterally; Visual fields grossly normal,  III,IV, VI: ptosis not present, extra-ocular motions intact bilaterally, pupils equal, round, reactive to light and accommodation V,VII: smile symmetric, facial light touch sensation normal bilaterally VIII: hearing normal  bilaterally IX,X: uvula rises symmetrically XI: bilateral shoulder shrug XII: midline tongue extension Motor: Right :  Upper extremity   5/5                                      Left:     Upper extremity   5/5             Lower extremity   5/5                                                  Lower extremity   5/5 Tone and bulk:normal tone throughout; no atrophy noted Sensory: reduced sensation to light touch, pinprick on left arm Deep Tendon Reflexes: 2+ and symmetric throughout Plantars: Right: downgoing                                Left: downgoing Cerebellar: normal finger-to-nose, normal rapid alternating movements and normal heel-to-shin test Gait: normal gait and station  IMAGING: I have personally reviewed the radiological images below and agree with the radiology interpretations.  Ct Head Wo Contrast Result Date: 12/31/2016 IMPRESSION: 5 mm hyperdense area within the right frontal white  matter concerning for small petechial hemorrhage, possibly within a small area white matter infarct. Lacunar infarcts also noted in the right thalamus.   Mr Jeri Cos And Wo Contrast Result Date: 12/31/2016 IMPRESSION: Acute infarct right thalamus. Possible small acute infarct posterior limb internal capsule on the right 8 x 10 mm extra-axial enhancing mass right parietal convexity compatible with small meningioma Hyperintensity in the subcortical white matter in the right posterior frontal lobe. This contains hemorrhage on CT and mild enhancement. This could be due to acute hemorrhagic infarction. Metastatic disease possible. Short term follow-up MRI suggested Image quality degraded by motion. There is extensive motion on the postcontrast images.    MRIHead                                                           PENDING Echocardiogram: not done                               PENDING B/L Carotid U/S:                                                 PENDING _____________________________________________________________________ ASSESSMENT: Elizabeth Chase is a 76 y.o. female with PMH of prior HTN presented with left-sided numbness since yesterday. She presented to [facility] emergency room where CT head showed no area of hemorrhage in the right frontal lobe and a hypodensity in the right thalamus. MRI brain confirmed acute infarct in the right thalamus,measuring 10 mm with possible small acute infarct posterior limb internal capsule on the right,hyperintensity in the subcortical white matter of the right posterior frontal lobe with a small amount of hemorrhage  Acute infarct in the Right thalamus, possible small Acute infarct Right posterior limb internal capsule Right posterior frontal lobe small amount of hemorrhage.  Suspected Etiology: Likely HTN hemorrhage.  Resultant Symptoms: Left Facial Numbness Stroke Risk Factors: hypertension and smoking Other Stroke Risk Factors: Advanced age,   Outstanding Stroke Work-up Studies: Echocardiogram: not done                                    PENDING B/L Carotid U/S:                                                     PENDING MRI Head                                                                PENDING Blood cx to rule out endocarditis -                          PENDING  PLAN  01/01/2017: HOLD ASA until neuroimaging is stable & without evidence of bleeding Started Atorvastatin 80 mg Ongoing aggressive stroke risk factor management Patient counseled to be compliant with her antithrombotic medications  Right parietal  Meningioma - 8 x 10 mm extra-axial enhancing mass Metastatic disease possible.  Short term follow-up MRI suggested  R/O ENDOCARDITIS: Blood cx to rule out endocarditis - PENDING  HYPERTENSION: Stable- Some elevated B/P's overnight SBP goal less than 140/90  Continue Labetolol PRN Long term BP goal normotensive. May slowly restart home B/P medications after 48  hours Home Meds: Lisinopril  HYPERLIPIDEMIA: No results found for: CHOL, TRIG, HDL, CHOLHDL, VLDL, LDLCALC Home Meds:  NONE LDL  goal < 70 Started on Lipitor to 80 mg daily Continue statin at discharge  R/O DIABETES: No results found for: HGBA1C No results for input(s): GLUCAP in the last 168 hours. HgbA1c goal < 7.0 Currently on:N/A Continue CBG monitoring and SSI DM education   TOBACCO ABUSE Nicoderm Patch PRN Patient counseled to quit   Other Active Problems: Principal Problem:   Right thalamic infarction [facility])  Hospital day # 1  VTE prophylaxis: SCD's  Diet : Diet Heart Room service appropriate? Yes; Fluid consistency: Thin   Prior Home Stroke Medications:  No antithrombotic Prior to Admission Hospital Current Stroke Medications: Lipitor 80 mg Stroke New Meds Plan: Now on No antithrombotic at this time due to hemorrhage Discharge Stroke Meds: Please discharge patient on decision pending, will likely add ASA.  Disposition: Final discharge disposition not confirmed Therapy Recs:  HOME Follow Recs:  System, Pcp Not In Follow up with [facility] in 6 weeks  FAMILY UPDATES: Son family at bedside  TEAM UPDATES: Alma Friendly, MD STATUS:    Start     Ordered   12/31/16 1751  Full code  Continuous     12/31/16 [place], [place] [facility] Neurology Team 01/01/2017 8:32 AM  Attending note:  76 year old female with history of hypertension admitted for left-sided numbness.  MRI showed small right thalamic infarct.  However it also found on MRI of the right frontal white matter abnormal T2 signal with minimal hemorrhage and enhancement, etiology unclear.  DDX including chronic infarct, metastatic tumor, or MS plaque pending, MRI with contrast for further evaluation.  MRI negative.  2D echo and carotid Doppler pending.  LDL and A1c pending.  Patient still likely due to small vessel disease.  She does not take aspirin PTA.  No need to  hold off antiplatelet as the hemorrhage was minimal on MRI.  Will start aspirin and continue Lipitor.  Will follow.  Rosalin Hawking, MD PhD Stroke Neurology 01/01/2017 9:53 PM   To contact Stroke Continuity provider, please refer to [email or web address] After hours, contact General Neurology

## 2017-01-01 NOTE — Evaluation (Signed)
Speech Language Pathology Evaluation Patient Details Name: Elizabeth Chase MRN: [id number] DOB: 01-Apr-1940 Today's Date: 01/01/2017 Time: 0820-0852 SLP Time Calculation (min) (ACUTE ONLY): 32 min  Problem List:  Patient Active Problem List   Diagnosis Date Noted  . Right thalamic infarction [facility]) 01/01/2017   Past Medical History:  Past Medical History:  Diagnosis Date  . Hypertension    Past Surgical History: History reviewed. No pertinent surgical history. HPI:  76 yo female with medical h/o HTN admitted to [facility] with left sided numbness.  Pt found to have right thalamic hypodensity, subcortical white matter right posterior frontal hemorrhage.  Pt reports she lives with her daughter.  She denies speech/cognitive changes with this event.    Assessment / Plan / Recommendation Clinical Impression  MOCA 7.2 administered with pt scoring 23/30 - cut off for normal 26/30.  Pt strengths on testing included spatial, naming, language, abstract thought and orientation.  She did demonstrate difficulty with word recall but recognized words from choice -indicating adequate storage.  No dysarthria or aphasia noted nor focal CN deficits impacting speech.  Son arrived toward end of session and SlP provided pt and son with memory compensation strategies.  Advised to have pt's daughter assure pt is managing her medications, appointments, etc accurately using teach back.  No SLP follow up indicated as pt will have support needed at home and has been educated.     SLP Assessment  SLP Recommendation/Assessment: Patient does not need any further Speech [facility] SLP Visit Diagnosis: Cognitive communication deficit (R41.841)    Follow Up Recommendations  None    Frequency and Duration           SLP Evaluation Cognition  Overall Cognitive Status: (pt with memory deficits per MOCA 7.2) Arousal/Alertness: Awake/alert Orientation Level: Oriented to person;Oriented to place;Oriented to  time;Oriented to situation(x specific date, no calendar in room) Attention: Sustained;Selective Sustained Attention: Appears intact Selective Attention: Appears intact Memory: Impaired Memory Impairment: Retrieval deficit(1/5 words I, 4/5 words with cues) Awareness: Appears intact Problem Solving: Appears intact Safety/Judgment: Appears intact       Comprehension  Auditory Comprehension Overall Auditory Comprehension: Appears within functional limits for tasks assessed Yes/No Questions: Not tested Commands: Within Functional Limits Conversation: Complex Visual Recognition/Discrimination Discrimination: Within Function Limits Reading Comprehension Reading Status: Within funtional limits(for tasks assessed)    Expression Expression Primary Mode of Expression: Verbal Verbal Expression Overall Verbal Expression: Appears within functional limits for tasks assessed Initiation: No impairment Repetition: No impairment Naming: No impairment Pragmatics: No impairment Written Expression Dominant Hand: Right Written Expression: Within Functional Limits   Oral / Motor  Oral Motor/Sensory Function Overall Oral Motor/Sensory Function: Within functional limits Motor Speech Overall Motor Speech: Appears within functional limits for tasks assessed Respiration: Within functional limits Resonance: Within functional limits Articulation: Within functional limitis Intelligibility: Intelligible Motor Planning: Witnin functional limits   GO                    Macario Golds 01/01/2017, 9:55 AM Luanna Salk, [facility] SLP [phone]

## 2017-01-01 NOTE — Progress Notes (Signed)
Patient arrived around 2200 from [facility] ED alert and oriented X 4, some numbness and tingling in left upper and lower extremity arm whose than leg some mild drift and ataxia in left arm, SCD'd are on as well as telemetry, Q2 neuro checks and vital signs completed in [facility] ED. Will continue to monitor.

## 2017-01-01 NOTE — Evaluation (Signed)
Physical Therapy Evaluation Patient Details Name: Elizabeth Chase MRN: [id number] DOB: 24-Apr-1940 Today's Date: 01/01/2017   History of Present Illness  76 yo female with medical h/o HTN admitted to [facility] with left sided numbness.  Pt found to have right thalamic hypodensity, subcortical white matter right posterior frontal hemorrhage.  Pt reports she lives with her daughter.  She denies speech/cognitive changes with this event.    Clinical Impression  Patient evaluated by Physical Therapy with no further acute PT needs identified. All education has been completed and the patient has no further questions. Pt with mild tingling of RUE remaining but has functional use of R hand. Otherwise baseline for functional mobility.  See below for any follow-up Physical Therapy or equipment needs. PT is signing off. Thank you for this referral.     Follow Up Recommendations No PT follow up    Equipment Recommendations  None recommended by PT    Recommendations for Other Services       Precautions / Restrictions Precautions Precautions: None Restrictions Weight Bearing Restrictions: No      Mobility  Bed Mobility Overal bed mobility: Independent                Transfers Overall transfer level: Independent                  Ambulation/Gait Ambulation/Gait assistance: Modified independent (Device/Increase time) Ambulation Distance (Feet): 200 Feet Assistive device: None Gait Pattern/deviations: WFL(Within Functional Limits) Gait velocity: WFL Gait velocity interpretation: at or above normal speed for age/gender General Gait Details: pt began with slow gait but increased speed as she was up longer. Did have imbalance when she looked to the L but was able to self correct with a side step  Stairs Stairs: Yes Stairs assistance: Modified independent (Device/Increase time) Stair Management: One rail Right;Alternating pattern;Forwards Number of Stairs: 10 General stair comments:  no difficulty with alternating pattern. O2 sats 97%, HR 118 bpm after stairs  Wheelchair Mobility    Modified Rankin (Stroke Patients Only) Modified Rankin (Stroke Patients Only) Pre-Morbid Rankin Score: No symptoms Modified Rankin: No significant disability     Balance Overall balance assessment: Modified Independent                               Standardized Balance Assessment Standardized Balance Assessment : Berg Balance Test Berg Balance Test Sit to Stand: Able to stand without using hands and stabilize independently Standing Unsupported: Able to stand safely 2 minutes Sitting with Back Unsupported but Feet Supported on Floor or Stool: Able to sit safely and securely 2 minutes Stand to Sit: Sits safely with minimal use of hands Transfers: Able to transfer safely, minor use of hands Standing Unsupported with Eyes Closed: Able to stand 10 seconds safely Standing Ubsupported with Feet Together: Able to place feet together independently and stand 1 minute safely From Standing, Reach Forward with Outstretched Arm: Can reach forward >12 cm safely (5") From Standing Position, Pick up Object from Floor: Able to pick up shoe, needs supervision From Standing Position, Turn to Look Behind Over each Shoulder: Looks behind from both sides and weight shifts well Turn 360 Degrees: Able to turn 360 degrees safely in 4 seconds or less Standing Unsupported, Alternately Place Feet on Step/Stool: Able to stand independently and safely and complete 8 steps in 20 seconds Standing Unsupported, One Foot in Front: Needs help to step but can hold 15 seconds Standing on  One Leg: Tries to lift leg/unable to hold 3 seconds but remains standing independently Total Score: 48         Pertinent Vitals/Pain Pain Assessment: No/denies pain    Home Living Family/patient expects to be discharged to:: Private residence Living Arrangements: Children Available Help at Discharge: Family;Available  24 hours/day Type of Home: House Home Access: Stairs to enter Entrance Stairs-Rails: None Entrance Stairs-Number of Steps: 3 Home Layout: One level Home Equipment: None Additional Comments: pt loves with her daughter who is there most of the time    Prior Function Level of Independence: Independent         Comments: drives, cooks, cleans. Not very active otherwise, says she likes to sit and watch TV and sometimes shop     Hand Dominance   Dominant Hand: Right    Extremity/Trunk Assessment   Upper Extremity Assessment Upper Extremity Assessment: Overall WFL for tasks assessed(still has some tingling R UE)    Lower Extremity Assessment Lower Extremity Assessment: Overall WFL for tasks assessed    Cervical / Trunk Assessment Cervical / Trunk Assessment: Normal  Communication   Communication: No difficulties  Cognition Arousal/Alertness: Awake/alert Behavior During Therapy: WFL for tasks assessed/performed Overall Cognitive Status: Within Functional Limits for tasks assessed                                 General Comments: son present on eval and agrees that pt baseline with cognition      General Comments General comments (skin integrity, edema, etc.): per pt report she is at her baseline LOF. Reviewed stroke sxs, pt verbalized understanding    Exercises     Assessment/Plan    PT Assessment Patent does not need any further PT services  PT Problem List         PT Treatment Interventions      PT Goals (Current goals can be found in the Care Plan section)  Acute Rehab PT Goals Patient Stated Goal: return home PT Goal Formulation: All assessment and education complete, DC therapy    Frequency     Barriers to discharge        Co-evaluation               AM-PAC PT "6 Clicks" Daily Activity  Outcome Measure Difficulty turning over in bed (including adjusting bedclothes, sheets and blankets)?: None Difficulty moving from lying on  back to sitting on the side of the bed? : None Difficulty sitting down on and standing up from a chair with arms (e.g., wheelchair, bedside commode, etc,.)?: None Help needed moving to and from a bed to chair (including a wheelchair)?: None Help needed walking in hospital room?: None Help needed climbing 3-5 steps with a railing? : None 6 Click Score: 24    End of Session Equipment Utilized During Treatment: Gait belt Activity Tolerance: Patient tolerated treatment well Patient left: in bed;with call bell/phone within reach;with family/visitor present Nurse Communication: Mobility status PT Visit Diagnosis: Unsteadiness on feet (R26.81)    Time: 1202-1224 PT Time Calculation (min) (ACUTE ONLY): 22 min   Charges:   PT Evaluation $PT Eval Moderate Complexity: 1 Mod     PT G Codes:        Leighton Roach, PT  Acute Rehab Services  [place] 01/01/2017, 1:51 PM

## 2017-01-01 NOTE — Care Management Note (Signed)
Case Management Note  Patient Details  Name: Elizabeth Chase MRN: [id number] Date of Birth: 27-Nov-1940  Subjective/Objective:   Pt admitted with CVA. She is from home with her daughter.                 Action/Plan: Awaiting PT/OT evals. CM following for d/c needs, physician orders.  Expected Discharge Date:  (unknown)               Expected Discharge Plan:     In-House Referral:     Discharge planning Services     Post Acute Care Choice:    Choice offered to:     DME Arranged:    DME Agency:     HH Arranged:    HH Agency:     Status of Service:  In process, will continue to follow  If discussed at Long Length of Stay Meetings, dates discussed:    Additional Comments:  Pollie Friar, RN 01/01/2017, 10:28 AM

## 2017-01-01 NOTE — Progress Notes (Signed)
OT Evaluation  PTA, pt lived with daughter and was independent with ADL and mobility, enjoyed cooking/cleaning and drove. Pt complains of continued deficits with sensation LUE. PT states "I sometimes drop/spillthings". Educated pt/son on home safety regarding sensation deficits. Pt demonstrated difficulty with delayed recall therefore, recommend S with IADL tasks, such as medication management and cooking. Recommend pt refrain from driving at this time and discuss this at follow up visit with MD. No further OT needs.    01/01/17 1500  OT Visit Information  Last OT Received On 01/01/17  Assistance Needed +1  History of Present Illness 76 yo female with medical h/o HTN admitted to [facility] with left sided numbness.  MRI + Acute infarct right thalamus; Possible small acute infarct posterior internal capsule.posterior white matter frontal hemorrhage.    Precautions  Precautions Other (comment)  Precaution Comments decreased sensation L UE  Restrictions  Weight Bearing Restrictions No  Home Living  Family/patient expects to be discharged to: Private residence  Living Arrangements Children  Available Help at Discharge Family;Available 24 hours/day  Type of Home House  Home Access Stairs to enter  Entrance Stairs-Number of Steps 3  Entrance Stairs-Rails None  Home Layout One level  Bathroom Biomedical scientist Yes  How Accessible Accessible via walker  Home Equipment None  Additional Comments pt lives with her daughter who is there most of the time; enjoys word seardh puzzles, cleaning and shopping; drives  Prior Function  Level of Independence Independent  Comments drives, cooks, cleans. Not very active otherwise, says she likes to sit and watch TV and sometimes shop  Communication  Communication No difficulties  Cognition  Arousal/Alertness Awake/alert  Behavior During Therapy [facility] for tasks assessed/performed  Overall Cognitive  Status Pt demonstrating difficulty with dealyed recall. Pt educated on CVA signs/symptoms using Be[employer]. Pt unable to recall information. Reviewed information 2 more time - pt able to 2/6 points.   General Comments son present on eval and agrees that pt baseline with cognition  Upper Extremity Assessment  Upper Extremity Assessment LUE deficits/detail  LUE Deficits / Details complains of LUE feeling "numb/tingling"; mild senosrimotor deficits; complains of dropping/spilling items in hand  LUE Sensation decreased light touch;decreased proprioception  Lower Extremity Assessment  Lower Extremity Assessment Overall WFL for tasks assessed  Cervical / Trunk Assessment  Cervical / Trunk Assessment Normal  ADL  General ADL Comments Able to complete basic ADL tasks at baeline. Educated pt/son on need to have supervision with tasks such as cooking.cleaning and medicaiton managment. Pt demosntrated difficulty with delayed recall. Reocmmended S with any medicaiton and drefrain from driving at this time.   Vision- History  Baseline Vision/History Wears glasses  Wears Glasses Reading only  Vision- Assessment  Vision Assessment? No apparent visual deficits  Perception  Comments WFL  Praxis  Praxis tested? WFL  Bed Mobility  Overal bed mobility Independent  Transfers  Overall transfer level Independent  Balance  Overall balance assessment Modified Independent  OT - End of Session  Activity Tolerance Patient tolerated treatment well  Patient left in bed;with call bell/phone within reach;with family/visitor present  Nurse Communication Mobility status  OT Assessment  OT Recommendation/Assessment Patient does not need any further OT services  OT Visit Diagnosis Muscle weakness (generalized) (M62.81)  OT Problem List Impaired sensation  AM-PAC OT "6 Clicks" Daily Activity Outcome Measure  Help from another person eating meals? 4  Help from another person taking care of personal grooming? 4  Help  from another person toileting, which includes using toliet, bedpan, or urinal? 4  Help from another person bathing (including washing, rinsing, drying)? 4  Help from another person to put on and taking off regular upper body clothing? 4  Help from another person to put on and taking off regular lower body clothing? 4  6 Click Score 24  ADL G Code Conversion CH  OT Recommendation  Follow Up Recommendations No OT follow up;Supervision - Intermittent  OT Equipment None recommended by OT  Acute Rehab OT Goals  Patient Stated Goal return home  OT Goal Formulation All assessment and education complete, DC therapy  OT Time Calculation  OT Start Time (ACUTE ONLY) 1445  OT Stop Time (ACUTE ONLY) 1506  OT Time Calculation (min) 21 min  OT General Charges  $OT Visit 1 Visit  OT Evaluation  $OT Eval Low Complexity 1 Low  Written Expression  Dominant Hand Right  [facility], OT/L  [phone] 01/01/2017

## 2017-01-02 ENCOUNTER — Inpatient Hospital Stay (HOSPITAL_COMMUNITY): Payer: Medicare Other

## 2017-01-02 DIAGNOSIS — D329 Benign neoplasm of meninges, unspecified: Secondary | ICD-10-CM

## 2017-01-02 DIAGNOSIS — I361 Nonrheumatic tricuspid (valve) insufficiency: Secondary | ICD-10-CM

## 2017-01-02 DIAGNOSIS — E785 Hyperlipidemia, unspecified: Secondary | ICD-10-CM

## 2017-01-02 DIAGNOSIS — R9089 Other abnormal findings on diagnostic imaging of central nervous system: Secondary | ICD-10-CM

## 2017-01-02 DIAGNOSIS — I639 Cerebral infarction, unspecified: Secondary | ICD-10-CM

## 2017-01-02 DIAGNOSIS — E876 Hypokalemia: Secondary | ICD-10-CM

## 2017-01-02 LAB — CBC WITH DIFFERENTIAL/PLATELET
BASOS ABS: 0 10*3/uL (ref 0.0–0.1)
BASOS PCT: 0 %
EOS PCT: 7 %
Eosinophils Absolute: 0.5 10*3/uL (ref 0.0–0.7)
HEMATOCRIT: 40.4 % (ref 36.0–46.0)
Hemoglobin: 13.6 g/dL (ref 12.0–15.0)
Lymphocytes Relative: 19 %
Lymphs Abs: 1.4 10*3/uL (ref 0.7–4.0)
MCH: 30.2 pg (ref 26.0–34.0)
MCHC: 33.7 g/dL (ref 30.0–36.0)
MCV: 89.8 fL (ref 78.0–100.0)
MONO ABS: 0.7 10*3/uL (ref 0.1–1.0)
Monocytes Relative: 10 %
NEUTROS ABS: 4.6 10*3/uL (ref 1.7–7.7)
Neutrophils Relative %: 64 %
PLATELETS: 355 10*3/uL (ref 150–400)
RBC: 4.5 MIL/uL (ref 3.87–5.11)
RDW: 12.8 % (ref 11.5–15.5)
WBC: 7.2 10*3/uL (ref 4.0–10.5)

## 2017-01-02 LAB — ECHOCARDIOGRAM COMPLETE
HEIGHTINCHES: 65 in
Weight: 1862.45 oz

## 2017-01-02 LAB — HEMOGLOBIN A1C
Hgb A1c MFr Bld: 5.9 % — ABNORMAL HIGH (ref 4.8–5.6)
MEAN PLASMA GLUCOSE: 122.63 mg/dL

## 2017-01-02 LAB — BASIC METABOLIC PANEL
ANION GAP: 8 (ref 5–15)
BUN: 7 mg/dL (ref 6–20)
CALCIUM: 9.2 mg/dL (ref 8.9–10.3)
CO2: 27 mmol/L (ref 22–32)
Chloride: 104 mmol/L (ref 101–111)
Creatinine, Ser: 0.68 mg/dL (ref 0.44–1.00)
Glucose, Bld: 123 mg/dL — ABNORMAL HIGH (ref 65–99)
POTASSIUM: 3.2 mmol/L — AB (ref 3.5–5.1)
Sodium: 139 mmol/L (ref 135–145)

## 2017-01-02 LAB — LIPID PANEL
CHOLESTEROL: 159 mg/dL (ref 0–200)
HDL: 45 mg/dL (ref >40)
LDL Cholesterol: 92 mg/dL (ref 0–99)
Total CHOL/HDL Ratio: 3.5 RATIO
Triglycerides: 109 mg/dL (ref <150)
VLDL: 22 mg/dL (ref 0–40)

## 2017-01-02 MED ORDER — POTASSIUM CHLORIDE CRYS ER 20 MEQ PO TBCR
40.0000 meq | EXTENDED_RELEASE_TABLET | Freq: Once | ORAL | Status: AC
  Administered 2017-01-02: 40 meq via ORAL
  Filled 2017-01-02: qty 2

## 2017-01-02 MED ORDER — ATORVASTATIN CALCIUM 10 MG PO TABS
20.0000 mg | ORAL_TABLET | Freq: Every day | ORAL | Status: DC
  Administered 2017-01-02: 20 mg via ORAL
  Filled 2017-01-02: qty 2

## 2017-01-02 MED ORDER — GADOBENATE DIMEGLUMINE 529 MG/ML IV SOLN
10.0000 mL | Freq: Once | INTRAVENOUS | Status: AC | PRN
  Administered 2017-01-02: 10 mL via INTRAVENOUS

## 2017-01-02 MED ORDER — NYSTATIN-TRIAMCINOLONE 100000-0.1 UNIT/GM-% EX CREA
TOPICAL_CREAM | Freq: Two times a day (BID) | CUTANEOUS | Status: DC
  Administered 2017-01-02: 14:00:00 via TOPICAL
  Filled 2017-01-02: qty 15

## 2017-01-02 MED ORDER — HYDROXYZINE HCL 10 MG PO TABS: 10.0000 mg | ORAL_TABLET | Freq: Three times a day (TID) | ORAL | 0 refills | Status: DC | PRN

## 2017-01-02 MED ORDER — ATORVASTATIN CALCIUM 20 MG PO TABS: 20.0000 mg | ORAL_TABLET | Freq: Every day | ORAL | 0 refills | Status: DC

## 2017-01-02 MED ORDER — HYDROXYZINE HCL 10 MG PO TABS
10.0000 mg | ORAL_TABLET | Freq: Three times a day (TID) | ORAL | Status: DC | PRN
  Filled 2017-01-02: qty 1

## 2017-01-02 MED ORDER — LISINOPRIL 20 MG PO TABS: 20.0000 mg | ORAL_TABLET | Freq: Every day | ORAL | 0 refills | Status: DC

## 2017-01-02 MED ORDER — ASPIRIN 325 MG PO TBEC: 325.0000 mg | DELAYED_RELEASE_TABLET | Freq: Every day | ORAL | 0 refills | Status: DC

## 2017-01-02 MED ORDER — IOPAMIDOL (ISOVUE-300) INJECTION 61%
INTRAVENOUS | Status: AC
  Filled 2017-01-02: qty 30

## 2017-01-02 MED ORDER — LISINOPRIL 20 MG PO TABS
20.0000 mg | ORAL_TABLET | Freq: Every day | ORAL | Status: DC
  Administered 2017-01-02: 20 mg via ORAL
  Filled 2017-01-02: qty 1

## 2017-01-02 MED ORDER — NYSTATIN-TRIAMCINOLONE 100000-0.1 UNIT/GM-% EX CREA: TOPICAL_CREAM | Freq: Two times a day (BID) | CUTANEOUS | 0 refills | Status: AC

## 2017-01-02 NOTE — Progress Notes (Addendum)
Patient discharged home son transported, all medications were called into [facility] in [place], she was alert and oriented and in no pain. Discharge summary was reviewed with patient and son they had no questions.

## 2017-01-02 NOTE — Plan of Care (Signed)
RN paged that pt was being d/c'd and "couldn't get her home Lisinopril because it was in the possession of her sister who was at a crack house". We normally do not prescribe pt meds that they already have at home, but given the situation, will give pt Rx for 7 pills of the lisinopril.  KJKG, NP [facility]

## 2017-01-02 NOTE — Progress Notes (Addendum)
STROKE TEAM PROGRESS NOTE   SUBJECTIVE (INTERVAL HISTORY)  Son is at the bedside. Patient is found laying in bed in NAD  Overall she feels her condition is gradually improving. States the numbness in her face has resolved and today states the "tingling" in her Left arm has resolved. Voices no new complaints. No new events reported overnight.  OBJECTIVE Lab Results: CBC:  Recent Labs  Lab 12/31/16 0907 12/31/16 1847 01/02/17 0228  WBC 7.5 6.1 7.2  HGB 14.6 12.8 13.6  HCT 43.1 38.5 40.4  MCV 91.5 90.8 89.8  PLT 369 343 355   BMP: Recent Labs  Lab 12/31/16 0907 12/31/16 1847 01/01/17 0850 01/02/17 0228  NA 137  --  141 139  K 3.5  --  3.8 3.2*  CL 102  --  107 104  CO2 26  --  29 27  GLUCOSE 107*  --  127* 123*  BUN 13  --  5* 7  CREATININE 0.68 0.63 0.66 0.68  CALCIUM 8.9  --  8.8* 9.2   Urinalysis:  Recent Labs  Lab 12/31/16 0907  COLORURINE STRAW*  APPEARANCEUR CLEAR  LABSPEC 1.003*  PHURINE 6.0  GLUCOSEU NEGATIVE  HGBUR NEGATIVE  BILIRUBINUR NEGATIVE  KETONESUR NEGATIVE  PROTEINUR NEGATIVE  NITRITE NEGATIVE  LEUKOCYTESUR LARGE*   PHYSICAL EXAM Temp:  [98 F (36.7 C)-98.6 F (37 C)] 98 F (36.7 C) (11/21 1004) Pulse Rate:  [83-92] (P) 89 (11/21 1606) Resp:  [18-20] (P) 20 (11/21 1606) BP: (142-198)/(60-90) (P) 121/54 (11/21 1606) SpO2:  [94 %-96 %] (P) 95 % (11/21 1606) General - Well nourished, well developed, in no apparent distress Respiratory - Lungs clear bilaterally. No wheezing. Cardiovascular - Regular rate and rhythm   Neurological Examination Mental Status: Alert, oriented, thought content appropriate.  Speech fluent without evidence of aphasia.  Able to follow 3 step commands without difficulty. Cranial Nerves: II: Discs flat bilaterally; Visual fields grossly normal,  III,IV, VI: ptosis not present, extra-ocular motions intact bilaterally, pupils equal, round, reactive to light and accommodation V,VII: smile symmetric, facial light  touch sensation normal bilaterally VIII: hearing normal bilaterally IX,X: uvula rises symmetrically XI: bilateral shoulder shrug XII: midline tongue extension Motor: Right :  Upper extremity   5/5                                      Left:     Upper extremity   5/5             Lower extremity   5/5                                                  Lower extremity   5/5 Tone and bulk:normal tone throughout; no atrophy noted Sensory: denies any reduced sensation to light touch, pinprick on left arm on exam today Deep Tendon Reflexes: 2+ and symmetric throughout Plantars: Right: downgoing                                Left: downgoing Cerebellar: normal finger-to-nose, normal rapid alternating movements and normal heel-to-shin test Gait: normal gait and station  IMAGING: I have personally reviewed the radiological images below and agree with the radiology interpretations.  Ct Head Wo Contrast Result Date: 12/31/2016 IMPRESSION: 5 mm hyperdense area within the right frontal white matter concerning for small petechial hemorrhage, possibly within a small area white matter infarct. Lacunar infarcts also noted in the right thalamus.   Mr Jeri Cos And Wo Contrast Result Date: 12/31/2016 IMPRESSION: Acute infarct right thalamus. Possible small acute infarct posterior limb internal capsule on the right 8 x 10 mm extra-axial enhancing mass right parietal convexity compatible with small meningioma Hyperintensity in the subcortical white matter in the right posterior frontal lobe. This contains hemorrhage on CT and mild enhancement. This could be due to acute hemorrhagic infarction. Metastatic disease possible. Short term follow-up MRI suggested Image quality degraded by motion. There is extensive motion on the postcontrast images.    MRI Brain W Contrast IMPRESSION: Irregular enhancing lesion right posterior frontal lobe with surrounding white matter edema and mild enhancement. This is concerning  for neoplasm and could represent a primary or secondary malignancy. Infection or septic embolus is also possible. Acute infarct right thalamus does not enhance 9 mm right parietal convexity meningioma  Echocardiogram: not done                               PENDING B/L Carotid U/S:                                                PENDING _____________________________________________________________________ ASSESSMENT: Ms. ANQUINETTE PIERRO is a 76 y.o. female with PMH of prior HTN presented with left-sided numbness since yesterday. She presented to [facility] emergency room where CT head showed no area of hemorrhage in the right frontal lobe and a hypodensity in the right thalamus. MRI brain confirmed acute infarct in the right thalamus,measuring 10 mm with possible small acute infarct posterior limb internal capsule on the right,hyperintensity in the subcortical white matter of the right posterior frontal lobe with a small amount of hemorrhage  Acute infarct in the Right thalamus, possible small Acute infarct Right posterior limb internal capsule Right posterior frontal lobe small amount of hemorrhage.  Suspected Etiology: Likely HTN hemorrhage.  Resultant Symptoms: Left Facial Numbness Stroke Risk Factors: hypertension and smoking Other Stroke Risk Factors: Advanced age,   01/01/17:76 year old female with history of hypertension admitted for left-sided numbness.  MRI showed small right thalamic infarct.  However it also found on MRI of the right frontal white matter abnormal T2 signal with minimal hemorrhage and enhancement, etiology unclear.  DDX including chronic infarct, metastatic tumor, or MS plaque pending, MRI with contrast for further evaluation.  MRI negative.  2D echo and carotid Doppler pending.  LDL and A1c pending.  Patient still likely due to small vessel disease.  She does not take aspirin PTA.  No need to hold off antiplatelet as the hemorrhage was minimal on MRI.  Will start  aspirin and continue Lipitor.  Will follow.  01/02/17: MRI Head shows irregular enhancing lesion right posterior frontal lobe with surrounding white matter edema and mild enhancement concerning for possible neoplasm. Will refer patient to outpatient appt with Neuro Oncology Dr Mickeal Skinner to confirm diagnosis. CT Abdomen an CT Chest pending.  Outstanding Stroke Work-up Studies: Echocardiogram: not done  PENDING B/L Carotid U/S:                                                     PENDING                                                    Blood cx to rule out endocarditis -                          NGTF  PLAN  01/02/2017: No need to hold off antiplatelet as the hemorrhage was minimal on MRI.   Continue ASA 325mg  / Atorvastatin 80 mg Ongoing aggressive stroke risk factor management Patient counseled to be compliant with her antithrombotic medications Please call if any acute findings on Echo or Carotid U/S for new recs  Patchy enhancement in the right posterior frontal lobe measures approximately 8 x 15 mm and has surrounding edema Metastatic disease possible.  Follow-up with Neuro Oncology and repeat MRI - Dr Mickeal Skinner  Right parietal  20mm Meningioma Follow-up MRI suggested in 2-3 months  R/O ENDOCARDITIS: Blood cx to rule out endocarditis - NGTF  HYPERTENSION: Stable- Some elevated B/P's overnight SBP goal less than 140/90  Continue Labetolol PRN Long term BP goal normotensive. Home Meds: Lisinopril restarted today at home dose  HYPERLIPIDEMIA:    Component Value Date/Time   CHOL 159 01/02/2017 0228   TRIG 109 01/02/2017 0228   HDL 45 01/02/2017 0228   CHOLHDL 3.5 01/02/2017 0228   VLDL 22 01/02/2017 0228   LDLCALC 92 01/02/2017 0228   Home Meds:  NONE LDL  goal < 70 Started on Lipitor to 80 mg daily Continue statin at discharge  R/O DIABETES: Lab Results  Component Value Date   HGBA1C 5.9 (H) 01/02/2017   No results for input(s): GLUCAP  in the last 168 hours. HgbA1c goal < 7.0 Currently on:N/A Continue CBG monitoring and SSI DM education   TOBACCO ABUSE Nicoderm Patch PRN Patient counseled to quit   Other Active Problems: Principal Problem:   Right thalamic infarction [facility]) Active Problems:   Essential hypertension   Hyperlipidemia   Abnormal brain MRI   Meningioma [facility])  Hospital day # 2  VTE prophylaxis: SCD's  Diet : Diet Heart Room service appropriate? Yes; Fluid consistency: Thin   Prior Home Stroke Medications:  No antithrombotic Prior to Admission Hospital Current Stroke Medications: Lipitor 80 mg Stroke New Meds Plan: Now on ASA 325mg  Discharge Stroke Meds: Please discharge patient on ASA 325 mg  Disposition: Final discharge disposition not confirmed Therapy Recs:  HOME Follow Recs:  Follow-up Information    Vaslow, Acey Lav, MD. Schedule an appointment as soon as possible for a visit in 2 week(s).   Specialties:  Psychiatry, Neurology, Oncology Contact information: [place] [address] [phone]        Rosalin Hawking, MD. Schedule an appointment as soon as possible for a visit in 6 week(s).   Specialty:  Neurology Contact information: [address] [place] [address] [phone]          System, Pcp Not In - Consulted Case Management to assign PCP  Follow up with [facility] in 6 weeks  FAMILY UPDATES: Son family at bedside  TEAM UPDATES: Cristal Ford, DO STATUS:    Start     Ordered   12/31/16 1751  Full code  Continuous     12/31/16 [place], [place] Stroke Neurology Team 01/02/2017 6:09 PM   ATTENDING NOTE: I reviewed above note and agree with the assessment and plan. I have made any additions or clarifications directly to the above note. Pt was seen and examined.   76 year old female with history of hypertension admitted for left-sided numbness.  MRI showed small right thalamic infarct.  However  it also found on MRI of the right frontal white matter abnormal T2 signal with minimal hemorrhage and enhancement, etiology unclear.  DDX including chronic infarct, metastatic tumor, or MS plaque. MRA negative.  2D echo EF 55% and carotid Doppler neg.  LDL 92 and A1c 5.9.  Patient stroke likely due to small vessel disease.  She does not take aspirin PTA. Now on aspirin and Lipitor, continue on discharge.   Regarding the right frontal WM abnormal T2 signal, appearance does not resemble neoplasm. However, will recommend to follow up with Dr. Mickeal Skinner, our neuro-oncology for further evaluation.   9 mm right parietal convexity meningioma is incidental finding, not related to pt symptoms.   Neurology will sign off. Please call with questions. Pt will follow up with Dr. Erlinda Hong at [facility] in about 6 weeks. Thanks for the consult.  Rosalin Hawking, MD PhD Stroke Neurology 01/02/2017 6:09 PM   To contact Stroke Continuity provider, please refer to [email or web address] After hours, contact General Neurology

## 2017-01-02 NOTE — Progress Notes (Signed)
  Echocardiogram 2D Echocardiogram has been performed.  Jennette Dubin 01/02/2017, 3:29 PM

## 2017-01-02 NOTE — Progress Notes (Signed)
*  PRELIMINARY RESULTS* Vascular Ultrasound Carotid Duplex (Doppler) has been completed.  Preliminary findings: Bilateral 1-39% ICA stenosis, antegrade vertebral flow.   Everrett Coombe 01/02/2017, 5:13 PM

## 2017-01-02 NOTE — Discharge Summary (Signed)
Physician Discharge Summary  Elizabeth Chase DOB: 04-20-1940 DOA: 12/31/2016  PCP: System, Pcp Not In  Admit date: 12/31/2016 Discharge date: 01/02/2017  Time spent: 45 minutes  Recommendations for Outpatient Follow-up:  Patient will be discharged to home with home health nursing.  Patient will need to follow up with primary care provider within one week of discharge.  Follow up with Dr. Mickeal Skinner, neuro-oncology. Follow up with neurology in 6 weeks. Patient should continue medications as prescribed.  Patient should follow a heart healthy diet.   Discharge Diagnoses:  Acute CVA of the right thalamus Right parietal meningioma Essential hypertension Tobacco abuse Seasonal alleriges Dermatitis  Discharge Condition: Stable  Diet recommendation: heart healthy  Filed Weights   12/31/16 0842 12/31/16 2201  Weight: 52.2 kg (115 lb) 52.8 kg (116 lb 6.5 oz)    History of present illness:  On 12/31/2016 by Dr. Riccardo Dubin Elizabeth Chase is a 76 y.o. female with medical history significant of hypertension who presented with left sided numbness. 24 hours ago she felt numbness on her left hand, radiated into her left upper extremity, she went to sleep with this symptoms and by the time she woke up she had complete numbness on her left side including left upper and left lower extremities. The numbness was persistent, severe in intensity, no improving or worsening factors, associated with difficulty ambulating but not weakness. She presented to the hospital for further evaluation.  Hospital Course:  Acute CVA of the right thalamus -presented with left arm numbness/tingling -CT head showed 5 mm hyperdense area within the right frontal white matter concerning for small petechial hemorrhage. Lacunar infarcts noted in the right thalamus. -MRI brain: Acute infarct right thalamus. Possible small acute infarct posterior limb internal capsule on the right. 8 x 10 mm extra-axial enhancing  mass right parietal convexity, small meningioma. Hyperintensity subcortical white matter in the right posterior frontal lobe, could be acute hemorrhagic infarction. Metastatic disease possible. -Repeat MRI showed irregular enhancing lesion right posterior frontal lobe with surrounding white matter edema and mild enhancement, concerning for neoplasm. -LDL 92, hemoglobin A1c 5.9 -Neurology consulted appreciated, recommended continuing statin, aspirin 325 mg daily -Carotid Doppler 1-39% ICA stenosis, antegrade vertebral flow -Echocardiogram EF 55%, no regional wall motion abnormalities. No cardiac source of emboli -PT, OT recommended no further needs  Right parietal meningioma -Noted on MRI -Neurology recommended outpatient follow-up with Dr. Mickeal Skinner (neuro-oncology)- and they can decide on further work up   Essential hypertension -Allowed for permissive hypertension during hospitalization given CVA -Continue lisinopril  Tobacco abuse -Smoking cessation discussed -continue nicotine patch  Seasonal alleriges -continue claritin  Dermatitis -noted for the past 1-2 months -patient stated her PCP placed her on something for scabies -wound care consulted and recommended mycolog cream BID -Advised patient to follow up with dermatologist if no improvement -started on atarax for itching  Hypokalemia -replaced, would repeat BMP  Procedures: Echocardiogram Carotid doppler  Consultations: Neurology   Discharge Exam: Vitals:   01/02/17 1412 01/02/17 1606  BP: (!) 155/67 (!) (P) 121/54  Pulse:  (P) 89  Resp:  (P) 20  Temp:    SpO2:  (P) 95%   Denies further numbness/tingling. Denies chest pain, shortness of breath, abdominal pain, nausea, vomiting, diarrhea, constipation, dizziness, headache. Complains of rash on her abdomen.   General: Well developed, well nourished, NAD, appears stated age  HEENT: NCAT, mucous membranes moist.  Cardiovascular: S1 S2 auscultated, no rubs,  murmurs or gallops. Regular rate and rhythm.  Respiratory:  Clear to auscultation bilaterally with equal chest rise  Abdomen: Soft, nontender, nondistended, + bowel sounds  Extremities: warm dry without cyanosis clubbing or edema  Neuro: AAOx3, nonfocal  Skin: diffuse rash noted on abdomen, chest  Psych: Normal affect and demeanor with intact judgement and insight  Discharge Instructions Discharge Instructions    Ambulatory referral to Neurology   Complete by:  As directed    An appointment is requested in approximately: 6 weeks Follow up with stroke clinic Elizabeth Chase preferred, if not available, then consider Elizabeth Chase, [facility] or Elizabeth Chase whoever is available) at [facility] in about 6-8 weeks. Thanks.   Discharge instructions   Complete by:  As directed    Patient will be discharged to home with home health nursing.  Patient will need to follow up with primary care provider within one week of discharge.  Follow up with Dr. Mickeal Skinner, neuro-oncology. Follow up with neurology in 6 weeks. Patient should continue medications as prescribed.  Patient should follow a heart healthy diet. Do not drive.  You were cared for by a hospitalist during your hospital stay. If you have any questions about your discharge medications or the care you received while you were in the hospital after you are discharged, you can call the unit and asked to speak with the hospitalist on call if the hospitalist that took care of you is not available. Once you are discharged, your primary care physician will handle any further medical issues. Please note that NO REFILLS for any discharge medications will be authorized once you are discharged, as it is imperative that you return to your primary care physician (or establish a relationship with a primary care physician if you do not have one) for your aftercare needs so that they can reassess your need for medications and monitor your lab values.     Current Discharge  Medication List    START taking these medications   Details  aspirin 325 MG EC tablet Take 1 tablet (325 mg total) by mouth daily. Qty: 30 tablet, Refills: 0    atorvastatin (LIPITOR) 20 MG tablet Take 1 tablet (20 mg total) by mouth daily at 6 PM. Qty: 30 tablet, Refills: 0    hydrOXYzine (ATARAX/VISTARIL) 10 MG tablet Take 1 tablet (10 mg total) by mouth 3 (three) times daily as needed for itching. Qty: 30 tablet, Refills: 0    nystatin-triamcinolone (MYCOLOG II) cream Apply topically 2 (two) times daily for 7 days. Qty: 30 g, Refills: 0      CONTINUE these medications which have NOT CHANGED   Details  cetirizine (ZYRTEC) 10 MG tablet Take 10 mg daily by mouth.    diphenhydrAMINE (BENADRYL) 25 MG tablet Take 25 mg at bedtime by mouth.    lisinopril (PRINIVIL,ZESTRIL) 20 MG tablet Take 20 mg daily by mouth.    mometasone (ELOCON) 0.1 % cream Apply 1 application daily as needed topically (itching).       Allergies  Allergen Reactions  . Bee Venom    Follow-up Information    Dennie Bible, NP. Schedule an appointment as soon as possible for a visit in 6 week(s).   Specialty:  Family Medicine Contact information: [address] [place] [address] [phone]        Ventura Sellers, MD. Schedule an appointment as soon as possible for a visit in 2 week(s).   Specialties:  Psychiatry, Neurology, Oncology Contact information: [place] [address] [phone]  The results of significant diagnostics from this hospitalization (including imaging, microbiology, ancillary and laboratory) are listed below for reference.    Significant Diagnostic Studies: Ct Head Wo Contrast  Result Date: 12/31/2016 CLINICAL DATA:  Intermittent left side numbness EXAM: CT HEAD WITHOUT CONTRAST TECHNIQUE: Contiguous axial images were obtained from the base of the skull through the vertex without intravenous contrast. COMPARISON:   None. FINDINGS: Brain: Small hyperdense area noted in the right frontal deep white matter measuring 5 mm concerning for small area of hemorrhage. This is in an area of slight low-density in the deep white matter, possibly petechial hemorrhage within an area of infarct. Low-density areas also noted in the right thalamus compatible with lacunar infarcts. No mass effect or midline shift. No hydrocephalus. Vascular: No hyperdense vessel or unexpected calcification. Skull: No acute calvarial abnormality. Sinuses/Orbits: Visualized paranasal sinuses and mastoids clear. Orbital soft tissues unremarkable. Other: None IMPRESSION: 5 mm hyperdense area within the right frontal white matter concerning for small petechial hemorrhage, possibly within a small area white matter infarct. Lacunar infarcts also noted in the right thalamus. MRI may be beneficial for further evaluation if felt clinically indicated. These results were called by telephone at the time of interpretation on 12/31/2016 at 9:29 am to Dr. Virgel Manifold , who verbally acknowledged these results. Electronically Signed   By: Rolm Baptise M.D.   On: 12/31/2016 09:32   Mr Jodene Nam Head Wo Contrast  Result Date: 01/01/2017 CLINICAL DATA:  Stroke follow-up EXAM: MRA HEAD WITHOUT CONTRAST TECHNIQUE: Angiographic images of the Circle of Willis were obtained using MRA technique without intravenous contrast. COMPARISON:  Brain MRI yesterday. FINDINGS: Mild left vertebral artery dominance. Dominant right PICA and left AICA. Small outpouching at the supraclinoid segment left ICA is conical and likely an infundibulum with associated vessel below resolution of MRA. There is a contralateral posterior communicating artery infundibulum that is also small. No suspected aneurysm. No branch occlusion or beading. IMPRESSION: Negative exam. No stenosis, beading, or embolic source to explain acute infarct. Electronically Signed   By: Monte Fantasia M.D.   On: 01/01/2017 10:50   Mr  Brain W Contrast  Result Date: 01/02/2017 CLINICAL DATA:  Abnormal MRI. Meningioma and hemorrhagic lesion follow-up. Prior study degraded by motion EXAM: MRI HEAD WITH CONTRAST TECHNIQUE: Multiplanar, multiecho pulse sequences of the brain and surrounding structures were obtained with intravenous contrast. CONTRAST:  3mL MULTIHANCE GADOBENATE DIMEGLUMINE 529 MG/ML IV SOLN COMPARISON:  MRI head 12/31/2016 FINDINGS: Brain: Current study is of good quality. The patient was able to hold still for the examination. Patchy enhancement in the right posterior frontal lobe measures approximately 8 x 15 mm and has surrounding edema. Small amount of hemorrhage noted on the prior MRI. Enhancement pattern is irregular. No other intra-axial enhancing lesions identified. 9 mm well-circumscribed extra-axial enhancing mass right parietal convexity is unchanged and compatible with meningioma. Ventricle size is normal. Chronic microvascular ischemic change throughout the white matter. Right thalamic acute infarct is noted on recent MRI but does not enhance. Vascular: Normal vascular enhancement Skull and upper cervical spine: Negative Sinuses/Orbits: Negative Other: None IMPRESSION: Irregular enhancing lesion right posterior frontal lobe with surrounding white matter edema and mild enhancement. This is concerning for neoplasm and could represent a primary or secondary malignancy. Infection or septic embolus is also possible. Acute infarct right thalamus does not enhance 9 mm right parietal convexity meningioma Electronically Signed   By: Franchot Gallo M.D.   On: 01/02/2017 10:31   Mr Jeri Cos And  Wo Contrast  Result Date: 12/31/2016 CLINICAL DATA:  Numbness left side EXAM: MRI HEAD WITHOUT AND WITH CONTRAST TECHNIQUE: Multiplanar, multiecho pulse sequences of the brain and surrounding structures were obtained without and with intravenous contrast. CONTRAST:  47mL MULTIHANCE GADOBENATE DIMEGLUMINE 529 MG/ML IV SOLN COMPARISON:   CT head 12/31/2016 FINDINGS: Brain: Acute infarct right thalamus measuring 10 mm. Possible small acute infarct posterior limb internal capsule on the right Postcontrast imaging is markedly degraded by motion due to claustrophobia. 8 x 10 mm extra-axial enhancing mass in the right parietal region consistent with small meningioma. Hyperintensity in the subcortical white matter of the right posterior frontal lobe with a small amount of hemorrhage. This shows patchy enhancement on the postcontrast sagittal images but not confirmed on other images likely due to motion. This corresponds to the area of hemorrhage on CT. The white matter hyperintensity may be vasogenic edema. No other areas of abnormal enhancement. Generalized atrophy.  Negative for hydrocephalus. Vascular: Normal arterial flow voids Skull and upper cervical spine: Negative Sinuses/Orbits: Mild mucosal edema paranasal sinuses.  Normal orbit Other: None IMPRESSION: Acute infarct right thalamus. Possible small acute infarct posterior limb internal capsule on the right 8 x 10 mm extra-axial enhancing mass right parietal convexity compatible with small meningioma Hyperintensity in the subcortical white matter in the right posterior frontal lobe. This contains hemorrhage on CT and mild enhancement. This could be due to acute hemorrhagic infarction. Metastatic disease possible. Short term follow-up MRI suggested Image quality degraded by motion. There is extensive motion on the postcontrast images. Electronically Signed   By: Franchot Gallo M.D.   On: 12/31/2016 13:15    Microbiology: Recent Results (from the past 240 hour(s))  Blood culture (routine x 2)     Status: None (Preliminary result)   Collection Time: 12/31/16 11:36 AM  Result Value Ref Range Status   Specimen Description BLOOD RIGHT FOREARM  Final   Special Requests   Final    BOTTLES DRAWN AEROBIC AND ANAEROBIC Blood Culture adequate volume   Culture   Final    NO GROWTH 2 DAYS Performed  at [facility], [address], [place], [place] [address]    Report Status PENDING  Incomplete  Blood culture (routine x 2)     Status: None (Preliminary result)   Collection Time: 12/31/16 11:36 AM  Result Value Ref Range Status   Specimen Description BLOOD LEFT ARM  Final   Special Requests   Final    BOTTLES DRAWN AEROBIC AND ANAEROBIC Blood Culture adequate volume   Culture   Final    NO GROWTH 2 DAYS Performed at [facility], [place] [address], [place], [place] [address]    Report Status PENDING  Incomplete     Labs: Basic Metabolic Panel: Recent Labs  Lab 12/31/16 0907 12/31/16 1847 01/01/17 0850 01/02/17 0228  NA 137  --  141 139  K 3.5  --  3.8 3.2*  CL 102  --  107 104  CO2 26  --  29 27  GLUCOSE 107*  --  127* 123*  BUN 13  --  5* 7  CREATININE 0.68 0.63 0.66 0.68  CALCIUM 8.9  --  8.8* 9.2   Liver Function Tests: Recent Labs  Lab 01/01/17 0850  AST 19  ALT 12*  ALKPHOS 53  BILITOT 0.6  PROT 6.7  ALBUMIN 3.7   No results for input(s): LIPASE, AMYLASE in the last 168 hours. No results for input(s): AMMONIA in  the last 168 hours. CBC: Recent Labs  Lab 12/31/16 0907 12/31/16 1847 01/02/17 0228  WBC 7.5 6.1 7.2  NEUTROABS 5.0  --  4.6  HGB 14.6 12.8 13.6  HCT 43.1 38.5 40.4  MCV 91.5 90.8 89.8  PLT 369 343 355   Cardiac Enzymes: No results for input(s): CKTOTAL, CKMB, CKMBINDEX, TROPONINI in the last 168 hours. BNP: BNP (last 3 results) No results for input(s): BNP in the last 8760 hours.  ProBNP (last 3 results) No results for input(s): PROBNP in the last 8760 hours.  CBG: No results for input(s): GLUCAP in the last 168 hours.     Signed:  Cristal Ford  [facility] 01/02/2017, 6:02 PM

## 2017-01-02 NOTE — Care Management Note (Addendum)
Case Management Note  Patient Details  Name: Elizabeth Chase MRN: [id number] Date of Birth: [birth year]  Subjective/Objective:                    Action/Plan: Plan is for patient to d/c home with [facility] services. Patient is familiar with [facility] and wants to use their services. Katrina with [facility] aware and accepted the referral. Pt's family to provide transportation home.   Pts PCP is at [facility] on [address]. Katrina with [facility] updated.   Expected Discharge Date:  (unknown)               Expected Discharge Plan:  [place]  In-House Referral:     Discharge planning Services  CM Consult  Post Acute Care Choice:    Choice offered to:  Patient  DME Arranged:    DME Agency:     HH Arranged:  RN, Nurse's Aide [place] Agency:  [facility]  Status of Service:  Completed, signed off  If discussed at [place] of Stay Meetings, dates discussed:    Additional Comments:  Pollie Friar, RN 01/02/2017, 3:38 PM

## 2017-01-02 NOTE — Consult Note (Signed)
[facility] Nurse wound consult note Reason for Consult: Dermatitis to abdomen and left chest.  Was being treated for scabies in this area with Permethrin but patient says MD was unsure.  This is not the expected appearance of scabies or location.  It is dry and itching.  Will begin a topical corticosteroid to relieve inflammation, itching and dryness.   Has Rosacea to her face.   Wound type: dermatitis/inflammation Pressure Injury POA: NA Measurement: Erythema, raised and warm to touch.  Entire abdomen below umbilicus Left chest below shoulder:  8 cm x 5 cm erythematous lesion Wound bed: pink, warm and itchy Drainage (amount, consistency, odor) none Periwound:intact Dressing procedure/placement/frequency: Cleanse skin with soap and water.  Apply Triamcinolone cream to abdomen and left chest rash twice daily.  Will not follow at this time.  Please re-consult if needed.  Domenic Moras RN BSN [place] Pager [phone]

## 2017-01-05 LAB — CULTURE, BLOOD (ROUTINE X 2)
Culture: NO GROWTH
Culture: NO GROWTH
Special Requests: ADEQUATE
Special Requests: ADEQUATE

## 2017-01-07 ENCOUNTER — Telehealth: Payer: Self-pay | Admitting: *Deleted

## 2017-01-07 NOTE — Telephone Encounter (Signed)
Spoke with Feather, patients daughter in law Scientist, product/process development).  New Patient appointment scheduled for patient.  Advised to arrive 15 mins prior to complete registration.

## 2017-01-15 ENCOUNTER — Encounter: Payer: Self-pay | Admitting: Internal Medicine

## 2017-01-15 ENCOUNTER — Ambulatory Visit (HOSPITAL_BASED_OUTPATIENT_CLINIC_OR_DEPARTMENT_OTHER): Payer: Medicare Other | Admitting: Internal Medicine

## 2017-01-15 ENCOUNTER — Telehealth: Payer: Self-pay | Admitting: Internal Medicine

## 2017-01-15 ENCOUNTER — Other Ambulatory Visit: Payer: Self-pay

## 2017-01-15 VITALS — BP 177/63 | HR 75 | Temp 97.5°F | Resp 18 | Ht 65.0 in | Wt 114.5 lb

## 2017-01-15 DIAGNOSIS — I6381 Other cerebral infarction due to occlusion or stenosis of small artery: Secondary | ICD-10-CM

## 2017-01-15 DIAGNOSIS — D329 Benign neoplasm of meninges, unspecified: Secondary | ICD-10-CM | POA: Diagnosis not present

## 2017-01-15 DIAGNOSIS — R9089 Other abnormal findings on diagnostic imaging of central nervous system: Secondary | ICD-10-CM

## 2017-01-15 DIAGNOSIS — I639 Cerebral infarction, unspecified: Secondary | ICD-10-CM | POA: Diagnosis not present

## 2017-01-15 NOTE — Progress Notes (Signed)
[facility] [address] [place], [place] [address] [phone]   New Patient Evaluation  Date of Service: 01/15/17 Patient Name: Elizabeth Chase Patient MRN: [id number] Patient DOB: 08-Aug-1940 Provider: Ventura Sellers, MD  Identifying Statement:  Elizabeth Chase is a 76 y.o. female with right frontal MRI lesion who presents for initial consultation and evaluation.    Referring Provider: No referring provider defined for this encounter.  History of Present Illness: The patient's records from the referring physician were obtained and reviewed and the patient interviewed to confirm this HPI.  Elizabeth Chase presented to the emergency department several weeks ago, after waking up with numbness involving the left face, arm and leg.  She also described mild left sided weakness at the time of presentation.  Workup in the hospital revealed a thalamic lacunar infarct, for which workup was completed during the admission.  Since the admission her strength is completely back to normal, and she is left with only residual numbness involving her left arm.  Incidentally discovered on the MRI, and confirmed on subsequent contrast enhanced study, was an additional region of abormality in the right frontal lobe, not necessarily consistent with stroke.  Elizabeth Chase presents today to review the study and diagnostic/treatment options moving forward.  Finally, a small asymptomatic meningioma was identified on the study as well.    Medications: Current Outpatient Medications on File Prior to Visit  Medication Sig Dispense Refill  . aspirin 325 MG EC tablet Take 1 tablet (325 mg total) by mouth daily. 30 tablet 0  . atorvastatin (LIPITOR) 20 MG tablet Take 1 tablet (20 mg total) by mouth daily at 6 PM. 30 tablet 0  . diphenhydrAMINE (BENADRYL) 25 MG tablet Take 25 mg at bedtime by mouth.    Marland Kitchen lisinopril (PRINIVIL,ZESTRIL) 20 MG tablet Take 1 tablet (20 mg total) by mouth  daily. 7 tablet 0  . cetirizine (ZYRTEC) 10 MG tablet Take 10 mg daily by mouth.     No current facility-administered medications on file prior to visit.     Allergies:  Allergies  Allergen Reactions  . Bee Venom    Past Medical History:  Past Medical History:  Diagnosis Date  . Hypertension    Past Surgical History: History reviewed. No pertinent surgical history. Social History:  Social History   Socioeconomic History  . Marital status: Unknown    Spouse name: Not on file  . Number of children: Not on file  . Years of education: Not on file  . Highest education level: Not on file  Social Needs  . Financial resource strain: Not on file  . Food insecurity - worry: Not on file  . Food insecurity - inability: Not on file  . Transportation needs - medical: Not on file  . Transportation needs - non-medical: Not on file  Occupational History  . Not on file  Tobacco Use  . Smoking status: Former Smoker    Packs/day: 1.00    Types: Cigarettes    Last attempt to quit: 12/31/2016    Years since quitting: 0.0  . Smokeless tobacco: Never Used  Substance and Sexual Activity  . Alcohol use: No    Frequency: Never  . Drug use: No  . Sexual activity: Not on file  Other Topics Concern  . Not on file  Social History Narrative  . Not on file   Family History:  Family History  Problem Relation Age of Onset  .  Diabetes Mother   . Cancer Father     Review of Systems: Constitutional: Denies fevers, chills or abnormal weight loss Eyes: Denies blurriness of vision Ears, nose, mouth, throat, and face: Denies mucositis or sore throat Respiratory: Denies cough, dyspnea or wheezes Cardiovascular: Denies palpitation, chest discomfort or lower extremity swelling Gastrointestinal:  Denies nausea, constipation, diarrhea GU: Denies dysuria or incontinence Skin: Denies abnormal skin rashes Neurological: Per HPI Musculoskeletal: Denies joint pain, back or neck discomfort. No decrease  in ROM Behavioral/Psych: Denies anxiety, disturbance in thought content, and mood instability  Physical Exam: Vitals:   01/15/17 0837 01/15/17 0900  BP: (!) 197/84 (!) 177/63  Pulse: 75   Resp: 18   Temp: (!) 97.5 F (36.4 C)   SpO2: 97%    KPS: 90. General: Alert, cooperative, pleasant, in no acute distress Head: Normal EENT: No conjunctival injection or scleral icterus. Oral mucosa moist Lungs: Resp effort normal Cardiac: Regular rate and rhythm Abdomen: Soft, non-distended abdomen Skin: No rashes cyanosis or petechiae. Extremities: No clubbing or edema  Neurologic Exam: Mental Status: Awake, alert, attentive to examiner. Oriented to self and environment. Language is fluent with intact comprehension.  Cranial Nerves: Visual acuity is grossly normal. Visual fields are full. Extra-ocular movements intact. No ptosis. Face is symmetric, tongue midline. Motor: Tone and bulk are normal. Power is full in both arms and legs. Reflexes are symmetric, no pathologic reflexes present. Intact finger to nose bilaterally Sensory: Some impairment in left arm only Gait: Normal and tandem gait is normal.   Labs: I have reviewed the data as listed    Component Value Date/Time   NA 139 01/02/2017 0228   K 3.2 (L) 01/02/2017 0228   CL 104 01/02/2017 0228   CO2 27 01/02/2017 0228   GLUCOSE 123 (H) 01/02/2017 0228   BUN 7 01/02/2017 0228   CREATININE 0.68 01/02/2017 0228   CALCIUM 9.2 01/02/2017 0228   PROT 6.7 01/01/2017 0850   ALBUMIN 3.7 01/01/2017 0850   AST 19 01/01/2017 0850   ALT 12 (L) 01/01/2017 0850   ALKPHOS 53 01/01/2017 0850   BILITOT 0.6 01/01/2017 0850   GFRNONAA >60 01/02/2017 0228   GFRAA >60 01/02/2017 0228   Lab Results  Component Value Date   WBC 7.2 01/02/2017   NEUTROABS 4.6 01/02/2017   HGB 13.6 01/02/2017   HCT 40.4 01/02/2017   MCV 89.8 01/02/2017   PLT 355 01/02/2017    Imaging: [facility] Clinician Interpretation: I have personally reviewed the CNS images  as listed.  My interpretation, in the context of the patient's clinical presentation, is subacute infarct vs occult neoplasm  Ct Head Wo Contrast  Result Date: 12/31/2016 CLINICAL DATA:  Intermittent left side numbness EXAM: CT HEAD WITHOUT CONTRAST TECHNIQUE: Contiguous axial images were obtained from the base of the skull through the vertex without intravenous contrast. COMPARISON:  None. FINDINGS: Brain: Small hyperdense area noted in the right frontal deep white matter measuring 5 mm concerning for small area of hemorrhage. This is in an area of slight low-density in the deep white matter, possibly petechial hemorrhage within an area of infarct. Low-density areas also noted in the right thalamus compatible with lacunar infarcts. No mass effect or midline shift. No hydrocephalus. Vascular: No hyperdense vessel or unexpected calcification. Skull: No acute calvarial abnormality. Sinuses/Orbits: Visualized paranasal sinuses and mastoids clear. Orbital soft tissues unremarkable. Other: None IMPRESSION: 5 mm hyperdense area within the right frontal white matter concerning for small petechial hemorrhage, possibly within a small area  white matter infarct. Lacunar infarcts also noted in the right thalamus. MRI may be beneficial for further evaluation if felt clinically indicated. These results were called by telephone at the time of interpretation on 12/31/2016 at 9:29 am to Dr. Virgel Manifold , who verbally acknowledged these results. Electronically Signed   By: Rolm Baptise M.D.   On: 12/31/2016 09:32   Mr Jodene Nam Head Wo Contrast  Result Date: 01/01/2017 CLINICAL DATA:  Stroke follow-up EXAM: MRA HEAD WITHOUT CONTRAST TECHNIQUE: Angiographic images of the Circle of Willis were obtained using MRA technique without intravenous contrast. COMPARISON:  Brain MRI yesterday. FINDINGS: Mild left vertebral artery dominance. Dominant right PICA and left AICA. Small outpouching at the supraclinoid segment left ICA is  conical and likely an infundibulum with associated vessel below resolution of MRA. There is a contralateral posterior communicating artery infundibulum that is also small. No suspected aneurysm. No branch occlusion or beading. IMPRESSION: Negative exam. No stenosis, beading, or embolic source to explain acute infarct. Electronically Signed   By: Monte Fantasia M.D.   On: 01/01/2017 10:50   Mr Brain W Contrast  Result Date: 01/02/2017 CLINICAL DATA:  Abnormal MRI. Meningioma and hemorrhagic lesion follow-up. Prior study degraded by motion EXAM: MRI HEAD WITH CONTRAST TECHNIQUE: Multiplanar, multiecho pulse sequences of the brain and surrounding structures were obtained with intravenous contrast. CONTRAST:  49mL MULTIHANCE GADOBENATE DIMEGLUMINE 529 MG/ML IV SOLN COMPARISON:  MRI head 12/31/2016 FINDINGS: Brain: Current study is of good quality. The patient was able to hold still for the examination. Patchy enhancement in the right posterior frontal lobe measures approximately 8 x 15 mm and has surrounding edema. Small amount of hemorrhage noted on the prior MRI. Enhancement pattern is irregular. No other intra-axial enhancing lesions identified. 9 mm well-circumscribed extra-axial enhancing mass right parietal convexity is unchanged and compatible with meningioma. Ventricle size is normal. Chronic microvascular ischemic change throughout the white matter. Right thalamic acute infarct is noted on recent MRI but does not enhance. Vascular: Normal vascular enhancement Skull and upper cervical spine: Negative Sinuses/Orbits: Negative Other: None IMPRESSION: Irregular enhancing lesion right posterior frontal lobe with surrounding white matter edema and mild enhancement. This is concerning for neoplasm and could represent a primary or secondary malignancy. Infection or septic embolus is also possible. Acute infarct right thalamus does not enhance 9 mm right parietal convexity meningioma Electronically Signed   By:  Franchot Gallo M.D.   On: 01/02/2017 10:31   Mr Jeri Cos And Wo Contrast  Result Date: 12/31/2016 CLINICAL DATA:  Numbness left side EXAM: MRI HEAD WITHOUT AND WITH CONTRAST TECHNIQUE: Multiplanar, multiecho pulse sequences of the brain and surrounding structures were obtained without and with intravenous contrast. CONTRAST:  77mL MULTIHANCE GADOBENATE DIMEGLUMINE 529 MG/ML IV SOLN COMPARISON:  CT head 12/31/2016 FINDINGS: Brain: Acute infarct right thalamus measuring 10 mm. Possible small acute infarct posterior limb internal capsule on the right Postcontrast imaging is markedly degraded by motion due to claustrophobia. 8 x 10 mm extra-axial enhancing mass in the right parietal region consistent with small meningioma. Hyperintensity in the subcortical white matter of the right posterior frontal lobe with a small amount of hemorrhage. This shows patchy enhancement on the postcontrast sagittal images but not confirmed on other images likely due to motion. This corresponds to the area of hemorrhage on CT. The white matter hyperintensity may be vasogenic edema. No other areas of abnormal enhancement. Generalized atrophy.  Negative for hydrocephalus. Vascular: Normal arterial flow voids Skull and upper cervical spine: Negative  Sinuses/Orbits: Mild mucosal edema paranasal sinuses.  Normal orbit Other: None IMPRESSION: Acute infarct right thalamus. Possible small acute infarct posterior limb internal capsule on the right 8 x 10 mm extra-axial enhancing mass right parietal convexity compatible with small meningioma Hyperintensity in the subcortical white matter in the right posterior frontal lobe. This contains hemorrhage on CT and mild enhancement. This could be due to acute hemorrhagic infarction. Metastatic disease possible. Short term follow-up MRI suggested Image quality degraded by motion. There is extensive motion on the postcontrast images. Electronically Signed   By: Franchot Gallo M.D.   On: 12/31/2016 13:15       Assessment/Plan 1. Abnormal brain MRI  2. Meningioma ([place])  3. Right thalamic infarction [facility])  We appreciate the opportunity to participate in the care of [employer].  The right frontal lesion in question is of unclear etiology at this time.    We will present the case at multidisciplinary brain tumor board on 01/21/17.  In the meantime we will plan on repeating a full contrast enhanced MRI in ~1 month, which will provide an additional point in time and help stratify the underlying pathology.  We counseled her today regarding secondary prevention for stroke.  She has, for now, accomplished smoking cessation.  She will monitor blood pressure at home regularly and present that data to her PCP at next visit.  Would aim for BP goal of <140/90.    Follow up will be determined based on input from tumor board group, but will plan on follow up visit with MRI in about a month.  Meningioma at this time can be followed with serial imaging as well.  All questions were answered. The patient knows to call the clinic with any problems, questions or concerns. No barriers to learning were detected.  The total time spent in the encounter was 60 minutes and more than 50% was on counseling and review of test results   Ventura Sellers, MD Medical Director of Neuro-Oncology [facility] at [place] 01/15/17 9:02 AM

## 2017-01-15 NOTE — Telephone Encounter (Signed)
Gave patient AVs and calendar of upcoming January 2019 appointments.

## 2017-02-14 ENCOUNTER — Ambulatory Visit (HOSPITAL_COMMUNITY): Admission: RE | Admit: 2017-02-14 | Payer: Medicare Other | Source: Ambulatory Visit

## 2017-02-18 ENCOUNTER — Ambulatory Visit: Payer: Medicare Other | Admitting: Internal Medicine

## 2017-02-20 ENCOUNTER — Ambulatory Visit (HOSPITAL_COMMUNITY)
Admission: RE | Admit: 2017-02-20 | Discharge: 2017-02-20 | Disposition: A | Discharge status: Home / Self Care | Payer: Medicare Other | Source: Ambulatory Visit | Attending: Internal Medicine | Admitting: Internal Medicine

## 2017-02-20 DIAGNOSIS — D329 Benign neoplasm of meninges, unspecified: Secondary | ICD-10-CM | POA: Diagnosis not present

## 2017-02-20 DIAGNOSIS — R9089 Other abnormal findings on diagnostic imaging of central nervous system: Secondary | ICD-10-CM

## 2017-02-20 DIAGNOSIS — R0989 Other specified symptoms and signs involving the circulatory and respiratory systems: Secondary | ICD-10-CM | POA: Diagnosis present

## 2017-02-20 DIAGNOSIS — I618 Other nontraumatic intracerebral hemorrhage: Secondary | ICD-10-CM | POA: Diagnosis not present

## 2017-02-20 DIAGNOSIS — I6782 Cerebral ischemia: Secondary | ICD-10-CM | POA: Diagnosis not present

## 2017-02-20 LAB — POCT I-STAT CREATININE: CREATININE: 0.7 mg/dL (ref 0.44–1.00)

## 2017-02-20 MED ORDER — GADOBENATE DIMEGLUMINE 529 MG/ML IV SOLN
15.0000 mL | Freq: Once | INTRAVENOUS | Status: AC | PRN
  Administered 2017-02-20: 11 mL via INTRAVENOUS

## 2017-02-21 ENCOUNTER — Telehealth: Payer: Self-pay | Admitting: Internal Medicine

## 2017-02-21 NOTE — Telephone Encounter (Signed)
Patient called in to reschedule appointment that was canceled

## 2017-02-28 ENCOUNTER — Telehealth: Payer: Self-pay | Admitting: *Deleted

## 2017-02-28 NOTE — Telephone Encounter (Signed)
"  I'd like to receive a call today.  Return number [phone]."   "Elizabeth Chase calling on behalf of my mother-in-law.  I am also her caregiver.  She has an appointment tomorrow to go over MRI results.  Having a transportation problem.  We need to know results of MRI, status of cancer.  Can she receive results with a phone call?  Do not wish to leave another message.  I've left messages for nurse, scheduler trying to be proactive so we don't cancel this early morning appointment but no one's called [place] back.  Just cancel the appointment.  No keep it and we'll try to work something out.  I'd like to speak with someone about this."

## 2017-03-01 ENCOUNTER — Other Ambulatory Visit: Payer: Self-pay | Admitting: *Deleted

## 2017-03-01 ENCOUNTER — Other Ambulatory Visit: Payer: Self-pay

## 2017-03-01 ENCOUNTER — Telehealth: Payer: Self-pay | Admitting: Internal Medicine

## 2017-03-01 ENCOUNTER — Encounter: Payer: Self-pay | Admitting: Internal Medicine

## 2017-03-01 ENCOUNTER — Inpatient Hospital Stay: Payer: Medicare Other | Attending: Internal Medicine | Admitting: Internal Medicine

## 2017-03-01 VITALS — BP 177/73 | HR 74 | Temp 97.5°F | Resp 18 | Ht 65.0 in | Wt >= 6400 oz

## 2017-03-01 DIAGNOSIS — R9089 Other abnormal findings on diagnostic imaging of central nervous system: Secondary | ICD-10-CM | POA: Insufficient documentation

## 2017-03-01 DIAGNOSIS — D329 Benign neoplasm of meninges, unspecified: Secondary | ICD-10-CM | POA: Insufficient documentation

## 2017-03-01 DIAGNOSIS — I639 Cerebral infarction, unspecified: Secondary | ICD-10-CM | POA: Diagnosis not present

## 2017-03-01 DIAGNOSIS — I6381 Other cerebral infarction due to occlusion or stenosis of small artery: Secondary | ICD-10-CM

## 2017-03-01 DIAGNOSIS — Z809 Family history of malignant neoplasm, unspecified: Secondary | ICD-10-CM | POA: Diagnosis not present

## 2017-03-01 DIAGNOSIS — Z87891 Personal history of nicotine dependence: Secondary | ICD-10-CM | POA: Diagnosis not present

## 2017-03-01 NOTE — Telephone Encounter (Signed)
Gave avs and calendar for January 2020 °

## 2017-03-01 NOTE — Progress Notes (Signed)
[facility] [place], [place] [address] [phone]   Interval Evaluation  Date of Service: 03/01/17 Patient Name: Elizabeth Chase Patient MRN: [id number] Patient DOB: 03/27/1940 Provider: Ventura Sellers, MD  Identifying Statement:  Elizabeth Chase is a 77 y.o. female with right frontal MRI lesion, meningioma, thalamic stroke.  Interval History:  Elizabeth Chase presents today for follow up after most recent MRI.  She has no complaints today, no new or progressive neurologic deficits.  She still has some residual numbness on the left arm from her stroke several months ago.  She is still ambulating independently, no cognitive decline.  Medications: Current Outpatient Medications on File Prior to Visit  Medication Sig Dispense Refill  . aspirin 325 MG EC tablet Take 1 tablet (325 mg total) by mouth daily. 30 tablet 0  . atorvastatin (LIPITOR) 20 MG tablet Take 1 tablet (20 mg total) by mouth daily at 6 PM. 30 tablet 0  . cetirizine (ZYRTEC) 10 MG tablet Take 10 mg daily by mouth.    . diphenhydrAMINE (BENADRYL) 25 MG tablet Take 25 mg at bedtime by mouth.    Marland Kitchen lisinopril (PRINIVIL,ZESTRIL) 20 MG tablet Take 1 tablet (20 mg total) by mouth daily. 7 tablet 0   No current facility-administered medications on file prior to visit.     Allergies:  Allergies  Allergen Reactions  . Bee Venom    Past Medical History:  Past Medical History:  Diagnosis Date  . Hypertension    Past Surgical History: No past surgical history on file. Social History:  Social History   Socioeconomic History  . Marital status: Unknown    Spouse name: Not on file  . Number of children: Not on file  . Years of education: Not on file  . Highest education level: Not on file  Social Needs  . Financial resource strain: Not on file  . Food insecurity - worry: Not on file  . Food insecurity - inability: Not on file  . Transportation needs - medical: Not on  file  . Transportation needs - non-medical: Not on file  Occupational History  . Not on file  Tobacco Use  . Smoking status: Former Smoker    Packs/day: 1.00    Types: Cigarettes    Last attempt to quit: 12/31/2016    Years since quitting: 0.1  . Smokeless tobacco: Never Used  Substance and Sexual Activity  . Alcohol use: No    Frequency: Never  . Drug use: No  . Sexual activity: Not on file  Other Topics Concern  . Not on file  Social History Narrative  . Not on file   Family History:  Family History  Problem Relation Age of Onset  . Diabetes Mother   . Cancer Father     Review of Systems: Constitutional: Denies fevers, chills or abnormal weight loss Eyes: Denies blurriness of vision Ears, nose, mouth, throat, and face: Denies mucositis or sore throat Respiratory: Denies cough, dyspnea or wheezes Cardiovascular: Denies palpitation, chest discomfort or lower extremity swelling Gastrointestinal:  Denies nausea, constipation, diarrhea GU: Denies dysuria or incontinence Skin: Denies abnormal skin rashes Neurological: Per HPI Musculoskeletal: Denies joint pain, back or neck discomfort. No decrease in ROM Behavioral/Psych: Denies anxiety, disturbance in thought content, and mood instability  Physical Exam: Vitals:   03/01/17 0919  BP: (!) 177/73  Pulse: 74  Resp: 18  Temp: (!) 97.5 F (36.4 C)  SpO2: 98%  KPS: 90. General: Alert, cooperative, pleasant, in no acute distress Head: Normal EENT: No conjunctival injection or scleral icterus. Oral mucosa moist Lungs: Resp effort normal Cardiac: Regular rate and rhythm Abdomen: Soft, non-distended abdomen Skin: No rashes cyanosis or petechiae. Extremities: No clubbing or edema  Neurologic Exam: Mental Status: Awake, alert, attentive to examiner. Oriented to self and environment. Language is fluent with intact comprehension.  Cranial Nerves: Visual acuity is grossly normal. Visual fields are full. Extra-ocular  movements intact. No ptosis. Face is symmetric, tongue midline. Motor: Tone and bulk are normal. Power is full in both arms and legs. Reflexes are symmetric, no pathologic reflexes present. Intact finger to nose bilaterally Sensory: Some impairment in left arm only Gait: Normal and tandem gait is normal.   Labs: I have reviewed the data as listed    Component Value Date/Time   NA 139 01/02/2017 0228   K 3.2 (L) 01/02/2017 0228   CL 104 01/02/2017 0228   CO2 27 01/02/2017 0228   GLUCOSE 123 (H) 01/02/2017 0228   BUN 7 01/02/2017 0228   CREATININE 0.70 02/20/2017 1511   CALCIUM 9.2 01/02/2017 0228   PROT 6.7 01/01/2017 0850   ALBUMIN 3.7 01/01/2017 0850   AST 19 01/01/2017 0850   ALT 12 (L) 01/01/2017 0850   ALKPHOS 53 01/01/2017 0850   BILITOT 0.6 01/01/2017 0850   GFRNONAA >60 01/02/2017 0228   GFRAA >60 01/02/2017 0228   Lab Results  Component Value Date   WBC 7.2 01/02/2017   NEUTROABS 4.6 01/02/2017   HGB 13.6 01/02/2017   HCT 40.4 01/02/2017   MCV 89.8 01/02/2017   PLT 355 01/02/2017    Imaging: [place] Clinician Interpretation: I have personally reviewed the CNS images as listed.  My interpretation, in the context of the patient's clinical presentation, is stable disease  Mr Jeri Cos Wo Contrast  Result Date: 02/20/2017 CLINICAL DATA:  Follow-up abnormal RIGHT frontal lobe lesion. Known meningioma. History of stroke and hypertension. EXAM: MRI HEAD WITHOUT AND WITH CONTRAST TECHNIQUE: Multiplanar, multiecho pulse sequences of the brain and surrounding structures were obtained without and with intravenous contrast. CONTRAST:  65mL MULTIHANCE GADOBENATE DIMEGLUMINE 529 MG/ML IV SOLN COMPARISON:  MRI of the head November 19th and January 02, 2018 FINDINGS: Mildly motion degraded examination. INTRACRANIAL CONTENTS: No reduced diffusion to suggest acute ischemia. T2 shine through RIGHT frontal lobe and minimal nodular enhancement at site of prior abnormality with faint  susceptibility artifact and ovoid T2 hyperintensity. The ventricles and sulci are normal for patient's age. Old bilateral thalamus and LEFT basal ganglia lacunar infarct. Prominent basal ganglia perivascular spaces associated with chronic small vessel ischemic disease. Old small bilateral cerebellar infarcts. Patchy supratentorial white matter FLAIR T2 hyperintensities. No suspicious parenchymal signal, masses, mass effect. No abnormal intraparenchymal or extra-axial enhancement. No abnormal extra-axial fluid collections. 8 mm homogeneously enhancing RIGHT parietal parafalcine extra-axial mass compatible with meningioma without mass effect. VASCULAR: Normal major intracranial vascular flow voids present at skull base. SKULL AND UPPER CERVICAL SPINE: No abnormal sellar expansion. No suspicious calvarial bone marrow signal. Craniocervical junction maintained. SINUSES/ORBITS: The mastoid air-cells and included paranasal sinuses are well-aerated.The included ocular globes and orbital contents are non-suspicious. OTHER: Patient is edentulous. IMPRESSION: 1. Faint residual enhancement and petechial hemorrhage in RIGHT frontal lobe corresponding to prior abnormality. Differential diagnosis includes subacute infarct or demyelination, less likely tumor. As subacute infarcts may enhance up to 4 months, recommend follow-up MRI of the brain in 2-4 months. 2. Scattered old small vessel infarcts.  Moderate chronic small vessel ischemic disease. 3. Stable subcentimeter RIGHT parietal meningioma. Electronically Signed   By: Elon Alas M.D.   On: 02/20/2017 16:03     Assessment/Plan 1. Abnormal brain MRI  2. Meningioma ([place])  3. Right thalamic infarction [facility])  We appreciate the opportunity to participate in the care of Elizabeth Chase.    The right frontal lesion has resolved, suggsting it was mostly likely vascular in etiology.  Right thalamic infarct has evolved as expected.  Her meningioma is stable.   She  should continue full dose ASA, statin.  We recommend she return to clinic in 1 year with an MRI for evaluation of her meningioma.  All questions were answered. The patient knows to call the clinic with any problems, questions or concerns. No barriers to learning were detected.  The total time spent in the encounter was 30 minutes and more than 50% was on counseling and review of test results   Ventura Sellers, MD Medical Director of Neuro-Oncology [facility] at [place] 03/01/17 9:00 AM

## 2017-03-05 ENCOUNTER — Ambulatory Visit: Payer: Self-pay | Admitting: Neurology

## 2017-03-05 ENCOUNTER — Telehealth: Payer: Self-pay

## 2017-03-05 NOTE — Telephone Encounter (Signed)
Patient no show for appt today. 

## 2017-03-06 ENCOUNTER — Encounter: Payer: Self-pay | Admitting: Neurology

## 2018-02-28 ENCOUNTER — Inpatient Hospital Stay: Payer: Medicare Other | Attending: Internal Medicine | Admitting: Internal Medicine

## 2018-03-04 ENCOUNTER — Telehealth: Payer: Self-pay | Admitting: *Deleted

## 2018-03-04 NOTE — Telephone Encounter (Signed)
Called and left message for patient to call back to schedule 1 year MRI for meningioma follow up and visit with Dr Mickeal Skinner to go over results.

## 2020-02-23 ENCOUNTER — Ambulatory Visit: Payer: Medicare Other | Admitting: Family Medicine

## 2020-03-17 ENCOUNTER — Ambulatory Visit: Payer: Medicaid Other | Admitting: Family Medicine

## 2020-04-14 ENCOUNTER — Other Ambulatory Visit: Payer: Self-pay

## 2020-04-14 ENCOUNTER — Inpatient Hospital Stay (HOSPITAL_COMMUNITY)
Admission: EM | Admit: 2020-04-14 | Discharge: 2020-04-26 | DRG: 628 | Disposition: A | Discharge status: Home / Self Care | Payer: Medicare HMO | Attending: Internal Medicine | Admitting: Internal Medicine

## 2020-04-14 ENCOUNTER — Encounter (HOSPITAL_COMMUNITY): Payer: Self-pay | Admitting: Emergency Medicine

## 2020-04-14 DIAGNOSIS — C50911 Malignant neoplasm of unspecified site of right female breast: Secondary | ICD-10-CM

## 2020-04-14 DIAGNOSIS — I714 Abdominal aortic aneurysm, without rupture, unspecified: Secondary | ICD-10-CM

## 2020-04-14 DIAGNOSIS — N6310 Unspecified lump in the right breast, unspecified quadrant: Secondary | ICD-10-CM | POA: Diagnosis present

## 2020-04-14 DIAGNOSIS — Z7951 Long term (current) use of inhaled steroids: Secondary | ICD-10-CM

## 2020-04-14 DIAGNOSIS — M25532 Pain in left wrist: Secondary | ICD-10-CM

## 2020-04-14 DIAGNOSIS — K529 Noninfective gastroenteritis and colitis, unspecified: Secondary | ICD-10-CM | POA: Diagnosis present

## 2020-04-14 DIAGNOSIS — Y848 Other medical procedures as the cause of abnormal reaction of the patient, or of later complication, without mention of misadventure at the time of the procedure: Secondary | ICD-10-CM | POA: Diagnosis present

## 2020-04-14 DIAGNOSIS — E860 Dehydration: Principal | ICD-10-CM | POA: Diagnosis present

## 2020-04-14 DIAGNOSIS — Z79899 Other long term (current) drug therapy: Secondary | ICD-10-CM

## 2020-04-14 DIAGNOSIS — I959 Hypotension, unspecified: Secondary | ICD-10-CM | POA: Diagnosis present

## 2020-04-14 DIAGNOSIS — C50811 Malignant neoplasm of overlapping sites of right female breast: Secondary | ICD-10-CM | POA: Diagnosis present

## 2020-04-14 DIAGNOSIS — Z803 Family history of malignant neoplasm of breast: Secondary | ICD-10-CM

## 2020-04-14 DIAGNOSIS — I10 Essential (primary) hypertension: Secondary | ICD-10-CM | POA: Diagnosis present

## 2020-04-14 DIAGNOSIS — Z833 Family history of diabetes mellitus: Secondary | ICD-10-CM

## 2020-04-14 DIAGNOSIS — N63 Unspecified lump in unspecified breast: Secondary | ICD-10-CM

## 2020-04-14 DIAGNOSIS — F17210 Nicotine dependence, cigarettes, uncomplicated: Secondary | ICD-10-CM | POA: Diagnosis present

## 2020-04-14 DIAGNOSIS — E785 Hyperlipidemia, unspecified: Secondary | ICD-10-CM | POA: Diagnosis present

## 2020-04-14 DIAGNOSIS — R55 Syncope and collapse: Secondary | ICD-10-CM | POA: Diagnosis not present

## 2020-04-14 DIAGNOSIS — Z66 Do not resuscitate: Secondary | ICD-10-CM | POA: Diagnosis present

## 2020-04-14 DIAGNOSIS — U071 COVID-19: Secondary | ICD-10-CM | POA: Diagnosis present

## 2020-04-14 DIAGNOSIS — C773 Secondary and unspecified malignant neoplasm of axilla and upper limb lymph nodes: Secondary | ICD-10-CM | POA: Diagnosis present

## 2020-04-14 DIAGNOSIS — Z7982 Long term (current) use of aspirin: Secondary | ICD-10-CM

## 2020-04-14 DIAGNOSIS — N179 Acute kidney failure, unspecified: Secondary | ICD-10-CM | POA: Diagnosis present

## 2020-04-14 DIAGNOSIS — R9431 Abnormal electrocardiogram [ECG] [EKG]: Secondary | ICD-10-CM | POA: Diagnosis present

## 2020-04-14 DIAGNOSIS — Z5902 Unsheltered homelessness: Secondary | ICD-10-CM

## 2020-04-14 DIAGNOSIS — M25432 Effusion, left wrist: Secondary | ICD-10-CM | POA: Diagnosis present

## 2020-04-14 DIAGNOSIS — R7989 Other specified abnormal findings of blood chemistry: Secondary | ICD-10-CM | POA: Diagnosis present

## 2020-04-14 DIAGNOSIS — N61 Mastitis without abscess: Secondary | ICD-10-CM | POA: Diagnosis present

## 2020-04-14 DIAGNOSIS — L7632 Postprocedural hematoma of skin and subcutaneous tissue following other procedure: Secondary | ICD-10-CM | POA: Diagnosis present

## 2020-04-14 HISTORY — DX: Cerebral infarction, unspecified: I63.9

## 2020-04-14 LAB — CBC WITH DIFFERENTIAL/PLATELET
Abs Immature Granulocytes: 0.04 10*3/uL (ref 0.00–0.07)
Basophils Absolute: 0 10*3/uL (ref 0.0–0.1)
Basophils Relative: 1 %
Eosinophils Absolute: 0.1 10*3/uL (ref 0.0–0.5)
Eosinophils Relative: 1 %
HCT: 39.3 % (ref 36.0–46.0)
Hemoglobin: 12 g/dL (ref 12.0–15.0)
Immature Granulocytes: 1 %
Lymphocytes Relative: 13 %
Lymphs Abs: 1 10*3/uL (ref 0.7–4.0)
MCH: 28.3 pg (ref 26.0–34.0)
MCHC: 30.5 g/dL (ref 30.0–36.0)
MCV: 92.7 fL (ref 80.0–100.0)
Monocytes Absolute: 0.4 10*3/uL (ref 0.1–1.0)
Monocytes Relative: 5 %
Neutro Abs: 6.2 10*3/uL (ref 1.7–7.7)
Neutrophils Relative %: 79 %
Platelets: 336 10*3/uL (ref 150–400)
RBC: 4.24 MIL/uL (ref 3.87–5.11)
RDW: 13.5 % (ref 11.5–15.5)
WBC: 7.8 10*3/uL (ref 4.0–10.5)
nRBC: 0 % (ref 0.0–0.2)

## 2020-04-14 NOTE — ED Triage Notes (Signed)
Patient arrived with EMS reports generalized weakness / fatigue today , patient added chronic right breast infection for several years  with drainage , no fever or chills , respirations unlabored.

## 2020-04-15 ENCOUNTER — Emergency Department (HOSPITAL_COMMUNITY): Payer: Medicare HMO

## 2020-04-15 ENCOUNTER — Other Ambulatory Visit: Payer: Self-pay

## 2020-04-15 DIAGNOSIS — E860 Dehydration: Secondary | ICD-10-CM | POA: Diagnosis present

## 2020-04-15 DIAGNOSIS — Z79899 Other long term (current) drug therapy: Secondary | ICD-10-CM | POA: Diagnosis not present

## 2020-04-15 DIAGNOSIS — Z833 Family history of diabetes mellitus: Secondary | ICD-10-CM | POA: Diagnosis not present

## 2020-04-15 DIAGNOSIS — U071 COVID-19: Secondary | ICD-10-CM

## 2020-04-15 DIAGNOSIS — Z170 Estrogen receptor positive status [ER+]: Secondary | ICD-10-CM | POA: Diagnosis not present

## 2020-04-15 DIAGNOSIS — Z803 Family history of malignant neoplasm of breast: Secondary | ICD-10-CM | POA: Diagnosis not present

## 2020-04-15 DIAGNOSIS — I10 Essential (primary) hypertension: Secondary | ICD-10-CM | POA: Diagnosis present

## 2020-04-15 DIAGNOSIS — R9431 Abnormal electrocardiogram [ECG] [EKG]: Secondary | ICD-10-CM

## 2020-04-15 DIAGNOSIS — N6310 Unspecified lump in the right breast, unspecified quadrant: Secondary | ICD-10-CM | POA: Diagnosis not present

## 2020-04-15 DIAGNOSIS — C50911 Malignant neoplasm of unspecified site of right female breast: Secondary | ICD-10-CM | POA: Diagnosis not present

## 2020-04-15 DIAGNOSIS — K529 Noninfective gastroenteritis and colitis, unspecified: Secondary | ICD-10-CM | POA: Diagnosis present

## 2020-04-15 DIAGNOSIS — Z7951 Long term (current) use of inhaled steroids: Secondary | ICD-10-CM | POA: Diagnosis not present

## 2020-04-15 DIAGNOSIS — F17210 Nicotine dependence, cigarettes, uncomplicated: Secondary | ICD-10-CM | POA: Diagnosis present

## 2020-04-15 DIAGNOSIS — Y848 Other medical procedures as the cause of abnormal reaction of the patient, or of later complication, without mention of misadventure at the time of the procedure: Secondary | ICD-10-CM | POA: Diagnosis present

## 2020-04-15 DIAGNOSIS — R7989 Other specified abnormal findings of blood chemistry: Secondary | ICD-10-CM | POA: Diagnosis present

## 2020-04-15 DIAGNOSIS — I714 Abdominal aortic aneurysm, without rupture: Secondary | ICD-10-CM | POA: Diagnosis present

## 2020-04-15 DIAGNOSIS — Z66 Do not resuscitate: Secondary | ICD-10-CM | POA: Diagnosis present

## 2020-04-15 DIAGNOSIS — R55 Syncope and collapse: Secondary | ICD-10-CM | POA: Diagnosis present

## 2020-04-15 DIAGNOSIS — Z0189 Encounter for other specified special examinations: Secondary | ICD-10-CM | POA: Diagnosis not present

## 2020-04-15 DIAGNOSIS — N63 Unspecified lump in unspecified breast: Secondary | ICD-10-CM | POA: Diagnosis not present

## 2020-04-15 DIAGNOSIS — C50919 Malignant neoplasm of unspecified site of unspecified female breast: Secondary | ICD-10-CM | POA: Diagnosis not present

## 2020-04-15 DIAGNOSIS — C50811 Malignant neoplasm of overlapping sites of right female breast: Secondary | ICD-10-CM | POA: Diagnosis present

## 2020-04-15 DIAGNOSIS — N179 Acute kidney failure, unspecified: Secondary | ICD-10-CM

## 2020-04-15 DIAGNOSIS — Z5902 Unsheltered homelessness: Secondary | ICD-10-CM | POA: Diagnosis not present

## 2020-04-15 DIAGNOSIS — Z7982 Long term (current) use of aspirin: Secondary | ICD-10-CM | POA: Diagnosis not present

## 2020-04-15 DIAGNOSIS — M25432 Effusion, left wrist: Secondary | ICD-10-CM | POA: Diagnosis present

## 2020-04-15 DIAGNOSIS — N61 Mastitis without abscess: Secondary | ICD-10-CM | POA: Diagnosis present

## 2020-04-15 DIAGNOSIS — C773 Secondary and unspecified malignant neoplasm of axilla and upper limb lymph nodes: Secondary | ICD-10-CM | POA: Diagnosis present

## 2020-04-15 DIAGNOSIS — L7632 Postprocedural hematoma of skin and subcutaneous tissue following other procedure: Secondary | ICD-10-CM | POA: Diagnosis present

## 2020-04-15 DIAGNOSIS — E785 Hyperlipidemia, unspecified: Secondary | ICD-10-CM | POA: Diagnosis present

## 2020-04-15 DIAGNOSIS — I959 Hypotension, unspecified: Secondary | ICD-10-CM | POA: Diagnosis present

## 2020-04-15 HISTORY — DX: COVID-19: U07.1

## 2020-04-15 HISTORY — DX: Abnormal electrocardiogram (ECG) (EKG): R94.31

## 2020-04-15 HISTORY — DX: Syncope and collapse: R55

## 2020-04-15 HISTORY — DX: Noninfective gastroenteritis and colitis, unspecified: K52.9

## 2020-04-15 HISTORY — DX: Acute kidney failure, unspecified: N17.9

## 2020-04-15 LAB — COMPREHENSIVE METABOLIC PANEL
ALT: 14 U/L (ref 0–44)
AST: 20 U/L (ref 15–41)
Albumin: 3.4 g/dL — ABNORMAL LOW (ref 3.5–5.0)
Alkaline Phosphatase: 53 U/L (ref 38–126)
Anion gap: 12 (ref 5–15)
BUN: 18 mg/dL (ref 8–23)
CO2: 23 mmol/L (ref 22–32)
Calcium: 9.6 mg/dL (ref 8.9–10.3)
Chloride: 104 mmol/L (ref 98–111)
Creatinine, Ser: 1.06 mg/dL — ABNORMAL HIGH (ref 0.44–1.00)
GFR, Estimated: 53 mL/min — ABNORMAL LOW (ref >60)
Glucose, Bld: 212 mg/dL — ABNORMAL HIGH (ref 70–99)
Potassium: 3.6 mmol/L (ref 3.5–5.1)
Sodium: 139 mmol/L (ref 135–145)
Total Bilirubin: 0.7 mg/dL (ref 0.3–1.2)
Total Protein: 6.6 g/dL (ref 6.5–8.1)

## 2020-04-15 LAB — TSH: TSH: 0.779 u[IU]/mL (ref 0.350–4.500)

## 2020-04-15 LAB — RESP PANEL BY RT-PCR (FLU A&B, COVID) ARPGX2
Influenza A by PCR: NEGATIVE
Influenza B by PCR: NEGATIVE
SARS Coronavirus 2 by RT PCR: POSITIVE — AB

## 2020-04-15 LAB — SEDIMENTATION RATE: Sed Rate: 20 mm/hr (ref 0–22)

## 2020-04-15 LAB — FERRITIN: Ferritin: 94 ng/mL (ref 11–307)

## 2020-04-15 LAB — TROPONIN I (HIGH SENSITIVITY)
Troponin I (High Sensitivity): 12 ng/L (ref <18)
Troponin I (High Sensitivity): 13 ng/L (ref <18)

## 2020-04-15 LAB — BRAIN NATRIURETIC PEPTIDE: B Natriuretic Peptide: 64 pg/mL (ref 0.0–100.0)

## 2020-04-15 LAB — PROCALCITONIN: Procalcitonin: 0.1 ng/mL

## 2020-04-15 LAB — LACTIC ACID, PLASMA
Lactic Acid, Venous: 2.2 mmol/L (ref 0.5–1.9)
Lactic Acid, Venous: 1.7 mmol/L (ref 0.5–1.9)

## 2020-04-15 LAB — HEPATITIS B SURFACE ANTIGEN: Hepatitis B Surface Ag: NONREACTIVE

## 2020-04-15 LAB — LACTATE DEHYDROGENASE: LDH: 212 U/L — ABNORMAL HIGH (ref 98–192)

## 2020-04-15 LAB — PROTIME-INR
INR: 1.1 (ref 0.8–1.2)
Prothrombin Time: 13.3 seconds (ref 11.4–15.2)

## 2020-04-15 LAB — D-DIMER, QUANTITATIVE: D-Dimer, Quant: 3.46 ug/mL-FEU — ABNORMAL HIGH (ref 0.00–0.50)

## 2020-04-15 LAB — C-REACTIVE PROTEIN: CRP: 4.3 mg/dL — ABNORMAL HIGH (ref <1.0)

## 2020-04-15 LAB — FIBRINOGEN: Fibrinogen: 375 mg/dL (ref 210–475)

## 2020-04-15 MED ORDER — SODIUM CHLORIDE 0.9 % IV SOLN: Freq: Once | INTRAVENOUS | Status: AC

## 2020-04-15 MED ORDER — CEFAZOLIN SODIUM-DEXTROSE 1-4 GM/50ML-% IV SOLN
1.0000 g | Freq: Three times a day (TID) | INTRAVENOUS | Status: DC
  Administered 2020-04-15 – 2020-04-18 (×9): 1 g via INTRAVENOUS
  Filled 2020-04-15 (×10): qty 50

## 2020-04-15 MED ORDER — SODIUM CHLORIDE 0.9 % IV SOLN
200.0000 mg | Freq: Once | INTRAVENOUS | Status: AC
  Administered 2020-04-15: 200 mg via INTRAVENOUS
  Filled 2020-04-15: qty 40

## 2020-04-15 MED ORDER — CEFAZOLIN SODIUM-DEXTROSE 1-4 GM/50ML-% IV SOLN
1.0000 g | Freq: Once | INTRAVENOUS | Status: AC
  Administered 2020-04-15: 1 g via INTRAVENOUS
  Filled 2020-04-15: qty 50

## 2020-04-15 MED ORDER — ENOXAPARIN SODIUM 40 MG/0.4ML ~~LOC~~ SOLN
40.0000 mg | SUBCUTANEOUS | Status: DC
  Administered 2020-04-15: 40 mg via SUBCUTANEOUS
  Filled 2020-04-15: qty 0.4

## 2020-04-15 MED ORDER — ASCORBIC ACID 500 MG PO TABS
500.0000 mg | ORAL_TABLET | Freq: Every day | ORAL | Status: DC
  Administered 2020-04-15 – 2020-04-26 (×12): 500 mg via ORAL
  Filled 2020-04-15 (×12): qty 1

## 2020-04-15 MED ORDER — ONDANSETRON HCL 4 MG PO TABS: 4.0000 mg | ORAL_TABLET | Freq: Four times a day (QID) | ORAL | Status: DC | PRN

## 2020-04-15 MED ORDER — SODIUM CHLORIDE 0.9 % IV BOLUS
500.0000 mL | Freq: Once | INTRAVENOUS | Status: AC
  Administered 2020-04-15: 500 mL via INTRAVENOUS

## 2020-04-15 MED ORDER — SODIUM CHLORIDE 0.9 % IV SOLN
100.0000 mg | Freq: Every day | INTRAVENOUS | Status: AC
  Administered 2020-04-16 – 2020-04-17 (×2): 100 mg via INTRAVENOUS
  Filled 2020-04-15 (×2): qty 20

## 2020-04-15 MED ORDER — FLUTICASONE FUROATE-VILANTEROL 200-25 MCG/INH IN AEPB
1.0000 | INHALATION_SPRAY | Freq: Every day | RESPIRATORY_TRACT | Status: DC
  Administered 2020-04-16 – 2020-04-26 (×11): 1 via RESPIRATORY_TRACT
  Filled 2020-04-15: qty 28

## 2020-04-15 MED ORDER — SODIUM CHLORIDE 0.9% FLUSH
3.0000 mL | Freq: Two times a day (BID) | INTRAVENOUS | Status: DC
  Administered 2020-04-15 – 2020-04-26 (×20): 3 mL via INTRAVENOUS

## 2020-04-15 MED ORDER — ONDANSETRON HCL 4 MG/2ML IJ SOLN
4.0000 mg | Freq: Once | INTRAMUSCULAR | Status: AC
  Administered 2020-04-15: 4 mg via INTRAVENOUS
  Filled 2020-04-15: qty 2

## 2020-04-15 MED ORDER — ACETAMINOPHEN 650 MG RE SUPP: 650.0000 mg | Freq: Four times a day (QID) | RECTAL | Status: DC | PRN

## 2020-04-15 MED ORDER — IOHEXOL 350 MG/ML SOLN
100.0000 mL | Freq: Once | INTRAVENOUS | Status: AC | PRN
  Administered 2020-04-15: 100 mL via INTRAVENOUS

## 2020-04-15 MED ORDER — ACETAMINOPHEN 325 MG PO TABS
650.0000 mg | ORAL_TABLET | Freq: Four times a day (QID) | ORAL | Status: DC | PRN
  Filled 2020-04-15: qty 2

## 2020-04-15 MED ORDER — ALBUTEROL SULFATE HFA 108 (90 BASE) MCG/ACT IN AERS
2.0000 | INHALATION_SPRAY | Freq: Four times a day (QID) | RESPIRATORY_TRACT | Status: DC | PRN
  Filled 2020-04-15: qty 6.7

## 2020-04-15 MED ORDER — ATORVASTATIN CALCIUM 10 MG PO TABS
20.0000 mg | ORAL_TABLET | Freq: Every day | ORAL | Status: DC
  Administered 2020-04-16 – 2020-04-25 (×10): 20 mg via ORAL
  Filled 2020-04-15 (×10): qty 2

## 2020-04-15 MED ORDER — ZINC SULFATE 220 (50 ZN) MG PO CAPS
220.0000 mg | ORAL_CAPSULE | Freq: Every day | ORAL | Status: DC
  Administered 2020-04-15 – 2020-04-26 (×12): 220 mg via ORAL
  Filled 2020-04-15 (×12): qty 1

## 2020-04-15 MED ORDER — GUAIFENESIN-DM 100-10 MG/5ML PO SYRP: 10.0000 mL | ORAL_SOLUTION | ORAL | Status: DC | PRN

## 2020-04-15 MED ORDER — METRONIDAZOLE IN NACL 5-0.79 MG/ML-% IV SOLN
500.0000 mg | Freq: Three times a day (TID) | INTRAVENOUS | Status: DC
  Administered 2020-04-15 – 2020-04-18 (×8): 500 mg via INTRAVENOUS
  Filled 2020-04-15 (×5): qty 100

## 2020-04-15 MED ORDER — ONDANSETRON HCL 4 MG/2ML IJ SOLN: 4.0000 mg | Freq: Four times a day (QID) | INTRAMUSCULAR | Status: DC | PRN

## 2020-04-15 NOTE — ED Provider Notes (Signed)
[facility] EMERGENCY DEPARTMENT Provider Note   CSN: [id number] Arrival date & time: 04/14/20  2249     History Chief Complaint  Patient presents with  . Breast Infection     Elizabeth Chase is a 80 y.o. female.  80 year old female with prior medical history as detailed below presents for evaluation.  Patient reports onset of weakness and fatigue yesterday.  She denies fevers at home.  This morning when she awoke and got up she developed significant weakness.  She then passed out.  Length of syncope is uncertain.  She now complains of vague abdominal discomfort.  She denies diarrhea.  She does report nausea.  She did not vomit per her report.  She denies headache or chest pain.  She denies shortness of breath.  Of note, patient with reported chronic right breast mass.  She reports that this has been there for "years."  She denies prior known history of breast cancer per report.  Patient's CODE STATUS discussed at bedside.  Patient confirms to this provider that she would prefer to be DNR/DNI.  The history is provided by the patient and medical records.       Past Medical History:  Diagnosis Date  . Hypertension     Patient Active Problem List   Diagnosis Date Noted  . Hyperlipidemia   . Abnormal brain MRI   . Meningioma ([place])   . Right thalamic infarction ([place]) 01/01/2017  . Essential hypertension     History reviewed. No pertinent surgical history.   OB History   No obstetric history on file.     Family History  Problem Relation Age of Onset  . Diabetes Mother   . Cancer Father     Social History   Tobacco Use  . Smoking status: Former Smoker    Packs/day: 1.00    Types: Cigarettes    Quit date: 12/31/2016    Years since quitting: 3.2  . Smokeless tobacco: Never Used  Substance Use Topics  . Alcohol use: No  . Drug use: No    Home Medications Prior to Admission medications   Medication Sig Start Date End Date Taking? Authorizing  Provider  cyanocobalamin 1000 MCG tablet Take 1,000 mcg by mouth daily. 03/26/19  Yes [provider]  ergocalciferol (VITAMIN D2) 1.25 MG (50000 UT) capsule Take 50,000 Units by mouth once a week. 02/24/19  Yes [provider]  ferrous sulfate 325 (65 FE) MG tablet Take 325 mg by mouth daily with breakfast.   Yes [provider]  fluticasone furoate-vilanterol (BREO ELLIPTA) 200-25 MCG/INH AEPB Inhale 1 puff into the lungs daily. 09/10/19  Yes [provider]  tiotropium (SPIRIVA HANDIHALER) 18 MCG inhalation capsule Place 1 capsule into inhaler and inhale daily. 02/23/19  Yes [provider]  aspirin 325 MG EC tablet Take 1 tablet (325 mg total) by mouth daily. 01/03/17   Mikhail, Velta Addison, DO  atorvastatin (LIPITOR) 20 MG tablet Take 1 tablet (20 mg total) by mouth daily at 6 PM. 01/03/17   Cristal Ford, DO  cetirizine (ZYRTEC) 10 MG tablet Take 10 mg daily by mouth.    [provider]  diphenhydrAMINE (BENADRYL) 25 MG tablet Take 25 mg at bedtime by mouth.    [provider]  lisinopril (ZESTRIL) 40 MG tablet Take 40 mg by mouth daily. 04/14/20   [provider]  trazodone (DESYREL) 300 MG tablet Take 300 mg by mouth at bedtime. 04/14/20   [provider]  Allergies    Bee venom  Review of Systems   Review of Systems  Constitutional: Negative for fever.  Neurological: Positive for syncope and weakness.  All other systems reviewed and are negative.   Physical Exam Updated Vital Signs BP 131/63   Pulse 99   Temp 98.5 F (36.9 C) (Oral)   Resp (!) 21   Ht 5\' 5"  (1.651 m)   Wt 54.9 kg   SpO2 91%   BMI 20.14 kg/m   Physical Exam Vitals and nursing note reviewed.  Constitutional:      General: She is not in acute distress.    Appearance: Normal appearance. She is well-developed and well-nourished.  HENT:     Head: Normocephalic and atraumatic.     Mouth/Throat:     Mouth: Oropharynx is clear and  moist.  Eyes:     Extraocular Movements: EOM normal.     Conjunctiva/sclera: Conjunctivae normal.     Pupils: Pupils are equal, round, and reactive to light.  Cardiovascular:     Rate and Rhythm: Normal rate and regular rhythm.     Heart sounds: Normal heart sounds.  Pulmonary:     Effort: Pulmonary effort is normal. No respiratory distress.     Breath sounds: Normal breath sounds.  Abdominal:     General: There is no distension.     Palpations: Abdomen is soft.     Tenderness: There is no abdominal tenderness.  Musculoskeletal:        General: No deformity or edema. Normal range of motion.     Cervical back: Normal range of motion and neck supple.  Skin:    General: Skin is warm and dry.     Comments: Large, extensive right breast mass with drainage  Neurological:     Mental Status: She is alert and oriented to person, place, and time.  Psychiatric:        Mood and Affect: Mood and affect normal.     ED Results / Procedures / Treatments   Labs (all labs ordered are listed, but only abnormal results are displayed) Labs Reviewed  RESP PANEL BY RT-PCR (FLU A&B, COVID) ARPGX2 - Abnormal; Notable for the following components:      Result Value   SARS Coronavirus 2 by RT PCR POSITIVE (*)    All other components within normal limits  COMPREHENSIVE METABOLIC PANEL - Abnormal; Notable for the following components:   Glucose, Bld 212 (*)    Creatinine, Ser 1.06 (*)    Albumin 3.4 (*)    GFR, Estimated 53 (*)    All other components within normal limits  LACTIC ACID, PLASMA - Abnormal; Notable for the following components:   Lactic Acid, Venous 2.2 (*)    All other components within normal limits  CULTURE, BLOOD (ROUTINE X 2)  CULTURE, BLOOD (ROUTINE X 2)  CBC WITH DIFFERENTIAL/PLATELET  PROTIME-INR  LACTIC ACID, PLASMA  URINALYSIS, ROUTINE W REFLEX MICROSCOPIC  TROPONIN I (HIGH SENSITIVITY)  TROPONIN I (HIGH SENSITIVITY)    EKG EKG Interpretation  Date/Time:  Friday  April 15 2020 07:24:32 EST Ventricular Rate:  94 PR Interval:    QRS Duration: 85 QT Interval:  391 QTC Calculation: 489 R Axis:   27 Text Interpretation: Sinus rhythm Atrial premature complex Borderline prolonged QT interval Confirmed by [name] [phone]) on 04/15/2020 7:26:03 AM   Radiology CT Head Wo Contrast  Result Date: 04/15/2020 CLINICAL DATA:  Syncope with normal neuro exam EXAM: CT HEAD WITHOUT CONTRAST TECHNIQUE: Contiguous  axial images were obtained from the base of the skull through the vertex without intravenous contrast. COMPARISON:  Brain MRI 01/02/2017 FINDINGS: Brain: No evidence of acute infarction, hemorrhage, hydrocephalus, extra-axial collection or mass effect. 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 Stable calcification in the right posterior frontal white matter age congruent cerebral volume loss. Known meningioma at the right vertex again measuring 9 mm. Vascular: No hyperdense vessel or unexpected calcification. Skull: Normal. Negative for fracture or focal lesion. Sinuses/Orbits: No acute finding. IMPRESSION: No acute finding or change from 2018 Electronically Signed   By: Monte Fantasia M.D.   On: 04/15/2020 08:42   CT Angio Chest PE W and/or Wo Contrast  Result Date: 04/15/2020 CLINICAL DATA:  Abdominal pain, nonlocalized, weakness, fatigue, chronic RIGHT breast infection for years drainage EXAM: CT ANGIOGRAPHY CHEST CT ABDOMEN AND PELVIS WITH CONTRAST TECHNIQUE: Multidetector CT imaging of the chest was performed using the standard protocol during bolus administration of intravenous contrast. Multiplanar CT image reconstructions and MIPs were obtained to evaluate the vascular anatomy. Multidetector CT imaging of the abdomen and pelvis was performed using the standard protocol during bolus administration of intravenous contrast. CONTRAST:  172mL OMNIPAQUE IOHEXOL 350 MG/ML SOLN IV. No oral contrast. COMPARISON:  None FINDINGS:  CTA CHEST FINDINGS Cardiovascular: Atherosclerotic calcifications aorta, coronary arteries, and proximal great vessels. Aorta normal caliber without aneurysm or dissection. Heart unremarkable. No pericardial effusion. Pulmonary arteries adequately opacified and patent. No evidence of pulmonary embolism. Mediastinum/Nodes: Base of cervical region normal appearance. Esophagus unremarkable. Large lobulated soft tissue mass identified at RIGHT breast extending through skin surface, 8.1 x 4.9 x 7.1 cm in size most consistent with malignancy rather than infection. Mass extends to the chest wall with loss of fat plane between the mass and the underlying pectoralis muscle. Additional small nodule within RIGHT breast 14 x 7 mm question second mass versus lymph node. RIGHT axillary adenopathy with nodes measuring up to 18 mm short axis. Normal sized mediastinal lymph nodes. Lungs/Pleura: Emphysematous changes. Dependent atelectasis. Lungs otherwise clear. No pulmonary infiltrate, pleural effusion, or pneumothorax. No pulmonary mass/nodule. Musculoskeletal: No acute osseous findings. Review of the MIP images confirms the above findings. CT ABDOMEN and PELVIS FINDINGS Hepatobiliary: Gallbladder and liver normal appearance Pancreas: Atrophic without focal mass Spleen: Normal appearance Adrenals/Urinary Tract: Cortical scarring at inferior pole LEFT kidney. Adrenal glands, kidneys, ureters, and bladder otherwise normal appearance Stomach/Bowel: Appendix not visualized, no pericecal inflammatory process seen. Stomach decompressed. Bowel wall thickening of descending colon with mild surrounding infiltrative changes over a long length favoring descending colitis. Minimal thickening of LEFT lateral conal fascia. Sigmoid diverticulosis without evidence of diverticulitis. Remaining bowel loops unremarkable. Vascular/Lymphatic: Atherosclerotic calcifications aorta, iliac arteries, visceral arteries. Infrarenal abdominal aortic aneurysm  4.2 x 4.0 cm in greatest axial dimensions extending 6.6 cm length, terminating above bifurcation. Moderate thrombus. No perianeurysmal infiltration/hemorrhage. No adenopathy. Reproductive: Atrophic uterus with unremarkable adnexa Other: No free air or free fluid.  No hernia. Musculoskeletal: Osseous demineralization. Review of the MIP images confirms the above findings. IMPRESSION: No evidence of pulmonary embolism. Large RIGHT breast mass 8.1 cm greatest size most consistent with malignancy with associated RIGHT axillary adenopathy. Additional small RIGHT breast nodule 14 x 7 mm question mass versus intramammary node. COPD. Descending colitis. Sigmoid diverticulosis without evidence of diverticulitis. Infrarenal abdominal aortic aneurysm 4.2 cm in greatest axial dimensions; Recommend follow-up every 12 months and vascular consultation. This recommendation follows ACR consensus guidelines: White Paper of the ACR Incidental Findings Committee  II on Vascular Findings. J Am Coll Radiol 2013; 10:789-794. Emphysema (ICD10-J43.9). Aortic Atherosclerosis (ICD10-I70.0). Aortic aneurysm NOS (ICD10-I71.9).: Findings called to Dr.  Francia Greaves on 04/15/2020 at 0857 hours. Electronically Signed   By: Lavonia Dana M.D.   On: 04/15/2020 08:58   CT ABDOMEN PELVIS W CONTRAST  Result Date: 04/15/2020 CLINICAL DATA:  Abdominal pain, nonlocalized, weakness, fatigue, chronic RIGHT breast infection for years drainage EXAM: CT ANGIOGRAPHY CHEST CT ABDOMEN AND PELVIS WITH CONTRAST TECHNIQUE: Multidetector CT imaging of the chest was performed using the standard protocol during bolus administration of intravenous contrast. Multiplanar CT image reconstructions and MIPs were obtained to evaluate the vascular anatomy. Multidetector CT imaging of the abdomen and pelvis was performed using the standard protocol during bolus administration of intravenous contrast. CONTRAST:  123mL OMNIPAQUE IOHEXOL 350 MG/ML SOLN IV. No oral contrast. COMPARISON:   None FINDINGS: CTA CHEST FINDINGS Cardiovascular: Atherosclerotic calcifications aorta, coronary arteries, and proximal great vessels. Aorta normal caliber without aneurysm or dissection. Heart unremarkable. No pericardial effusion. Pulmonary arteries adequately opacified and patent. No evidence of pulmonary embolism. Mediastinum/Nodes: Base of cervical region normal appearance. Esophagus unremarkable. Large lobulated soft tissue mass identified at RIGHT breast extending through skin surface, 8.1 x 4.9 x 7.1 cm in size most consistent with malignancy rather than infection. Mass extends to the chest wall with loss of fat plane between the mass and the underlying pectoralis muscle. Additional small nodule within RIGHT breast 14 x 7 mm question second mass versus lymph node. RIGHT axillary adenopathy with nodes measuring up to 18 mm short axis. Normal sized mediastinal lymph nodes. Lungs/Pleura: Emphysematous changes. Dependent atelectasis. Lungs otherwise clear. No pulmonary infiltrate, pleural effusion, or pneumothorax. No pulmonary mass/nodule. Musculoskeletal: No acute osseous findings. Review of the MIP images confirms the above findings. CT ABDOMEN and PELVIS FINDINGS Hepatobiliary: Gallbladder and liver normal appearance Pancreas: Atrophic without focal mass Spleen: Normal appearance Adrenals/Urinary Tract: Cortical scarring at inferior pole LEFT kidney. Adrenal glands, kidneys, ureters, and bladder otherwise normal appearance Stomach/Bowel: Appendix not visualized, no pericecal inflammatory process seen. Stomach decompressed. Bowel wall thickening of descending colon with mild surrounding infiltrative changes over a long length favoring descending colitis. Minimal thickening of LEFT lateral conal fascia. Sigmoid diverticulosis without evidence of diverticulitis. Remaining bowel loops unremarkable. Vascular/Lymphatic: Atherosclerotic calcifications aorta, iliac arteries, visceral arteries. Infrarenal abdominal  aortic aneurysm 4.2 x 4.0 cm in greatest axial dimensions extending 6.6 cm length, terminating above bifurcation. Moderate thrombus. No perianeurysmal infiltration/hemorrhage. No adenopathy. Reproductive: Atrophic uterus with unremarkable adnexa Other: No free air or free fluid.  No hernia. Musculoskeletal: Osseous demineralization. Review of the MIP images confirms the above findings. IMPRESSION: No evidence of pulmonary embolism. Large RIGHT breast mass 8.1 cm greatest size most consistent with malignancy with associated RIGHT axillary adenopathy. Additional small RIGHT breast nodule 14 x 7 mm question mass versus intramammary node. COPD. Descending colitis. Sigmoid diverticulosis without evidence of diverticulitis. Infrarenal abdominal aortic aneurysm 4.2 cm in greatest axial dimensions; Recommend follow-up every 12 months and vascular consultation. This recommendation follows ACR consensus guidelines: White Paper of the ACR Incidental Findings Committee II on Vascular Findings. J Am Coll Radiol 2013; 10:789-794. Emphysema (ICD10-J43.9). Aortic Atherosclerosis (ICD10-I70.0). Aortic aneurysm NOS (ICD10-I71.9).: Findings called to Dr.  Francia Greaves on 04/15/2020 at 0857 hours. Electronically Signed   By: Lavonia Dana M.D.   On: 04/15/2020 08:58   DG Chest Port 1 View  Result Date: 04/15/2020 CLINICAL DATA:  Recent syncopal episode EXAM: PORTABLE CHEST 1 VIEW COMPARISON:  None. FINDINGS:  Cardiac shadows within normal limits. Aortic calcifications are noted. Lungs are hyperinflated bilaterally. Rounded density is noted in the right base measuring proximally 6 cm. This may represent fluid within the major fissure although the possibility of underlying mass deserves consideration. CT of the chest with contrast is recommended for further evaluation. IMPRESSION: Rounded density in the right lung base suspicious for underlying mass. CT is recommended for further evaluation. Electronically Signed   By: Inez Catalina M.D.   On:  04/15/2020 07:36    Procedures Procedures   Medications Ordered in ED Medications  sodium chloride 0.9 % bolus 500 mL (has no administration in time range)  ondansetron (ZOFRAN) injection 4 mg (has no administration in time range)    ED Course  I have reviewed the triage vital signs and the nursing notes.  Pertinent labs & imaging results that were available during my care of the patient were reviewed by me and considered in my medical decision making (see chart for details).  Clinical Course as of 04/15/20 0931  Fri Apr 15, 2020  0928 MAP (mmHg): 78 [PM]    Clinical Course User Index [PM] Valarie Merino, MD   MDM Rules/Calculators/A&P                          MDM  Screen complete  Elizabeth Chase was evaluated in Emergency Department on 04/15/2020 for the symptoms described in the history of present illness. She was evaluated in the context of the global COVID-19 pandemic, which necessitated consideration that the patient might be at risk for infection with the SARS-CoV-2 virus that causes COVID-19. Institutional protocols and algorithms that pertain to the evaluation of patients at risk for COVID-19 are in a state of rapid change based on information released by regulatory bodies including the CDC and federal and state organizations. These policies and algorithms were followed during the patient's care in the ED.  Patient is presenting for evaluation of reported weakness, fatigue, and syncope.  Patient's exam is concerning for likely dehydration.  Patient also with obvious right breast mass that is highly concerning for untreated malignancy.  Patient's work-up is demonstrative of mild dehydration, right-sided breast mass with associated right axillary lymphadenopathy, AAA, likely acute Covid 19.   Ancef administered for possible cellulitis surrounding the right breast mass.  Cultures obtained.  Patient would benefit from further work-up and treatment as an inpatient.   Hospitalist service is aware of case and will evaluate for admission.  Patient verbally confirmed to this provider that she is a DNR/DNI.    Final Clinical Impression(s) / ED Diagnoses Final diagnoses:  Syncope, unspecified syncope type  Breast mass, right  Dehydration  COVID-19  Abdominal aortic aneurysm (AAA) without rupture [facility])    Rx / DC Orders ED Discharge Orders    None       Valarie Merino, MD 04/15/20 [phone]

## 2020-04-15 NOTE — ED Notes (Signed)
ED Provider at bedside. 

## 2020-04-15 NOTE — H&P (Signed)
History and Physical    [name] DTO:[id number] DOB: Jul 10, 1940 DOA: 04/14/2020  Referring MD/NP/PA: [name], MD PCP: Lesleigh Noe, MD  Patient coming from: lives with daughter [facility]  Chief Complaint:Passed out  I have personally briefly reviewed patient's old medical records in [place]   HPI: Elizabeth Chase is a 80 y.o. female with medical history significant of hypertension and hyperlipidemia who presented with complaints of passing out last night.  She had gotten out of the car to go into [employer] and the next thing she recalls is waking up on the ground.  Denies any trauma to her head. Her daughter was present at the time and caught her before she fell.  She reportedly only lost consciousness for short period in time.Yesterday she had been feeling weak and fatigued with associated symptoms of upset stomach with nausea.  Denies having any fever, vomiting, headache, cough, shortness of breath, chest pain, or diarrhea.  She had received her initial Covid vaccines, but not the booster.  Patient also reports that she has had this mass on her right breast that intermittently bleeds for quite some time, but states it was never worked up before the past.  ED Course: Upon admission into the emergency department patient was seen to be afebrile with blood pressures 90/53-132/62, and O2 saturations maintained on room air.  Labs from 3/3-3/4 significant for BUN 18, creatinine 1.06, glucose 212, high-sensitivity troponin negative x2, and lactic acid 2.2->1.7.  Patient's XIPJA-25 screening was positive. Chest x-ray was significant for right lung base density concerning for underlying mass.  CT scans of the chest, abdomen, and pelvis were significant for right breast mass measuring 8.1 cm with associated right axillary adenopathy, right breast nodule 14 x 7 mm versus intramammary node descending colitis, and infrarenal abdominal aortic aneurysm measuring 4.2 cm in greatest dimensions,  blood cultures had been obtained.  Patient had been given cefazolin 1 g IV, normal saline 500 mL bolus, and then placement rate of 125/h for at least 1 L.  TRH called to admit.   Review of Systems  Constitutional: Positive for malaise/fatigue. Negative for fever.  Eyes: Negative for photophobia and pain.  Respiratory: Negative for cough and shortness of breath.   Cardiovascular: Negative for chest pain and leg swelling.  Gastrointestinal: Positive for abdominal pain and nausea. Negative for constipation and vomiting.  Genitourinary: Negative for dysuria and hematuria.  Skin:       Positive for pruritus  Neurological: Positive for loss of consciousness and weakness.  Psychiatric/Behavioral: Negative for substance abuse.    Past Medical History:  Diagnosis Date  . Hypertension     History reviewed. No pertinent surgical history.   reports that she quit smoking about 3 years ago. Her smoking use included cigarettes. She smoked 1.00 pack per day. She has never used smokeless tobacco. She reports that she does not drink alcohol and does not use drugs.  Allergies  Allergen Reactions  . Bee Venom     Family History  Problem Relation Age of Onset  . Diabetes Mother   . Cancer Father     Prior to Admission medications   Medication Sig Start Date End Date Taking? Authorizing Provider  cyanocobalamin 1000 MCG tablet Take 1,000 mcg by mouth daily. 03/26/19  Yes [provider]  ergocalciferol (VITAMIN D2) 1.25 MG (50000 UT) capsule Take 50,000 Units by mouth once a week. 02/24/19  Yes [provider]  ferrous sulfate 325 (65 FE) MG tablet  Take 325 mg by mouth daily with breakfast.   Yes [provider]  fluticasone furoate-vilanterol (BREO ELLIPTA) 200-25 MCG/INH AEPB Inhale 1 puff into the lungs daily. 09/10/19  Yes [provider]  tiotropium (SPIRIVA HANDIHALER) 18 MCG inhalation capsule Place 1 capsule into inhaler and inhale daily. 02/23/19  Yes  [provider]  aspirin 325 MG EC tablet Take 1 tablet (325 mg total) by mouth daily. 01/03/17   Mikhail, Velta Addison, DO  atorvastatin (LIPITOR) 20 MG tablet Take 1 tablet (20 mg total) by mouth daily at 6 PM. 01/03/17   Cristal Ford, DO  cetirizine (ZYRTEC) 10 MG tablet Take 10 mg daily by mouth.    [provider]  diphenhydrAMINE (BENADRYL) 25 MG tablet Take 25 mg at bedtime by mouth.    [provider]  lisinopril (ZESTRIL) 40 MG tablet Take 40 mg by mouth daily. 04/14/20   [provider]  trazodone (DESYREL) 300 MG tablet Take 300 mg by mouth at bedtime. 04/14/20   [provider]    Physical Exam:  Constitutional: Elderly female currently Vitals:   04/15/20 0745 04/15/20 0830 04/15/20 0900 04/15/20 0915  BP: (!) 90/53 (!) 128/56 (!) 116/56 123/72  Pulse: 90 95 84 85  Resp: (!) 25 (!) 21 (!) 27 (!) 23  Temp:      TempSrc:      SpO2: 94% (!) 87% 91% 91%  Weight:      Height:       Eyes: PERRL, lids and conjunctivae normal ENMT: Mucous membranes are moist. Posterior pharynx clear of any exudate or lesions.   Neck: normal, supple, no masses, no thyromegaly Respiratory: clear to auscultation bilaterally, no wheezing, no crackles. Normal respiratory effort. No accessory muscle use.  Cardiovascular: Regular rate and rhythm, no murmurs / rubs / gallops. No extremity edema. 2+ pedal pulses. No carotid bruits.  Abdomen: No significant tenderness appreciated, no masses palpated. No hepatosplenomegaly. Bowel sounds positive.  Musculoskeletal: no clubbing / cyanosis. No joint deformity upper and lower extremities. Good ROM, no contractures. Normal muscle tone.  Skin: Large right breast mass with somewhat purulent and serosanguineous drainage Neurologic: CN 2-12 grossly intact. Sensation intact, DTR normal. Strength 5/5 in all 4.  Psychiatric: Normal judgment and insight. Alert and oriented x 3. Normal mood.     Labs on Admission: I have  personally reviewed following labs and imaging studies  CBC: Recent Labs  Lab 04/14/20 2259  WBC 7.8  NEUTROABS 6.2  HGB 12.0  HCT 39.3  MCV 92.7  PLT 035   Basic Metabolic Panel: Recent Labs  Lab 04/14/20 2259  NA 139  K 3.6  CL 104  CO2 23  GLUCOSE 212*  BUN 18  CREATININE 1.06*  CALCIUM 9.6   GFR: Estimated Creatinine Clearance: 36.7 mL/min (A) (by C-G formula based on SCr of 1.06 mg/dL (H)). Liver Function Tests: Recent Labs  Lab 04/14/20 2259  AST 20  ALT 14  ALKPHOS 53  BILITOT 0.7  PROT 6.6  ALBUMIN 3.4*   No results for input(s): LIPASE, AMYLASE in the last 168 hours. No results for input(s): AMMONIA in the last 168 hours. Coagulation Profile: Recent Labs  Lab 04/15/20 0742  INR 1.1   Cardiac Enzymes: No results for input(s): CKTOTAL, CKMB, CKMBINDEX, TROPONINI in the last 168 hours. BNP (last 3 results) No results for input(s): PROBNP in the last 8760 hours. HbA1C: No results for input(s): HGBA1C in the last 72 hours. CBG: No results for input(s): GLUCAP  in the last 168 hours. Lipid Profile: No results for input(s): CHOL, HDL, LDLCALC, TRIG, CHOLHDL, LDLDIRECT in the last 72 hours. Thyroid Function Tests: No results for input(s): TSH, T4TOTAL, FREET4, T3FREE, THYROIDAB in the last 72 hours. Anemia Panel: No results for input(s): VITAMINB12, FOLATE, FERRITIN, TIBC, IRON, RETICCTPCT in the last 72 hours. Urine analysis:    Component Value Date/Time   COLORURINE STRAW (A) 12/31/2016 0907   APPEARANCEUR CLEAR 12/31/2016 0907   LABSPEC 1.003 (L) 12/31/2016 0907   PHURINE 6.0 12/31/2016 0907   GLUCOSEU NEGATIVE 12/31/2016 0907   HGBUR NEGATIVE 12/31/2016 0907   BILIRUBINUR NEGATIVE 12/31/2016 0907   [place] 12/31/2016 0907   PROTEINUR NEGATIVE 12/31/2016 0907   NITRITE NEGATIVE 12/31/2016 0907   LEUKOCYTESUR LARGE (A) 12/31/2016 0907   Sepsis Labs: Recent Results (from the past 240 hour(s))  Resp Panel by RT-PCR (Flu A&B,  Covid) Nasopharyngeal Swab     Status: Abnormal   Collection Time: 04/15/20  7:42 AM   Specimen: Nasopharyngeal Swab; Nasopharyngeal(NP) swabs in vial transport medium  Result Value Ref Range Status   SARS Coronavirus 2 by RT PCR POSITIVE (A) NEGATIVE Final    Comment: RESULT CALLED TO, READ BACK BY AND VERIFIED WITH: RN S.BETRAND ON 04/15/2020 AT 0926 BY E.PARRISH (NOTE) SARS-CoV-2 target nucleic acids are DETECTED.  The SARS-CoV-2 RNA is generally detectable in upper respiratory specimens during the acute phase of infection. Positive results are indicative of the presence of the identified virus, but do not rule out bacterial infection or co-infection with other pathogens not detected by the test. Clinical correlation with patient history and other diagnostic information is necessary to determine patient infection status. The expected result is Negative.  Fact Sheet for Patients: EntrepreneurPulse.com.au  Fact Sheet for Healthcare Providers: IncredibleEmployment.be  This test is not yet approved or cleared by the [place] FDA and  has been authorized for detection and/or diagnosis of SARS-CoV-2 by FDA under an Emergency Use Authorization (EUA).  This EUA will remain in effect (meaning this  test can be used) for the duration of  the COVID-19 declaration under Section 564(b)(1) of the Act, 21 U.S.C. section 360bbb-3(b)(1), unless the authorization is terminated or revoked sooner.     Influenza A by PCR NEGATIVE NEGATIVE Final   Influenza B by PCR NEGATIVE NEGATIVE Final    Comment: (NOTE) The Xpert Xpress SARS-CoV-2/FLU/RSV plus assay is intended as an aid in the diagnosis of influenza from Nasopharyngeal swab specimens and should not be used as a sole basis for treatment. Nasal washings and aspirates are unacceptable for Xpert Xpress SARS-CoV-2/FLU/RSV testing.  Fact Sheet for  Patients: EntrepreneurPulse.com.au  Fact Sheet for Healthcare Providers: IncredibleEmployment.be  This test is not yet approved or cleared by the [place] FDA and has been authorized for detection and/or diagnosis of SARS-CoV-2 by FDA under an Emergency Use Authorization (EUA). This EUA will remain in effect (meaning this test can be used) for the duration of the COVID-19 declaration under Section 564(b)(1) of the Act, 21 U.S.C. section 360bbb-3(b)(1), unless the authorization is terminated or revoked.  Performed at [facility], [place] [address], [place], [place] [address]      Radiological Exams on Admission: CT Head Wo Contrast  Result Date: 04/15/2020 CLINICAL DATA:  Syncope with normal neuro exam EXAM: CT HEAD WITHOUT CONTRAST TECHNIQUE: Contiguous axial images were obtained from the base of the skull through the vertex without intravenous contrast. COMPARISON:  Brain MRI 01/02/2017 FINDINGS: Brain: No evidence of acute  infarction, hemorrhage, hydrocephalus, extra-axial collection or mass effect. 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 Stable calcification in the right posterior frontal white matter age congruent cerebral volume loss. Known meningioma at the right vertex again measuring 9 mm. Vascular: No hyperdense vessel or unexpected calcification. Skull: Normal. Negative for fracture or focal lesion. Sinuses/Orbits: No acute finding. IMPRESSION: No acute finding or change from 2018 Electronically Signed   By: Monte Fantasia M.D.   On: 04/15/2020 08:42   CT Angio Chest PE W and/or Wo Contrast  Result Date: 04/15/2020 CLINICAL DATA:  Abdominal pain, nonlocalized, weakness, fatigue, chronic RIGHT breast infection for years drainage EXAM: CT ANGIOGRAPHY CHEST CT ABDOMEN AND PELVIS WITH CONTRAST TECHNIQUE: Multidetector CT imaging of the chest was performed using the standard protocol during bolus  administration of intravenous contrast. Multiplanar CT image reconstructions and MIPs were obtained to evaluate the vascular anatomy. Multidetector CT imaging of the abdomen and pelvis was performed using the standard protocol during bolus administration of intravenous contrast. CONTRAST:  116mL OMNIPAQUE IOHEXOL 350 MG/ML SOLN IV. No oral contrast. COMPARISON:  None FINDINGS: CTA CHEST FINDINGS Cardiovascular: Atherosclerotic calcifications aorta, coronary arteries, and proximal great vessels. Aorta normal caliber without aneurysm or dissection. Heart unremarkable. No pericardial effusion. Pulmonary arteries adequately opacified and patent. No evidence of pulmonary embolism. Mediastinum/Nodes: Base of cervical region normal appearance. Esophagus unremarkable. Large lobulated soft tissue mass identified at RIGHT breast extending through skin surface, 8.1 x 4.9 x 7.1 cm in size most consistent with malignancy rather than infection. Mass extends to the chest wall with loss of fat plane between the mass and the underlying pectoralis muscle. Additional small nodule within RIGHT breast 14 x 7 mm question second mass versus lymph node. RIGHT axillary adenopathy with nodes measuring up to 18 mm short axis. Normal sized mediastinal lymph nodes. Lungs/Pleura: Emphysematous changes. Dependent atelectasis. Lungs otherwise clear. No pulmonary infiltrate, pleural effusion, or pneumothorax. No pulmonary mass/nodule. Musculoskeletal: No acute osseous findings. Review of the MIP images confirms the above findings. CT ABDOMEN and PELVIS FINDINGS Hepatobiliary: Gallbladder and liver normal appearance Pancreas: Atrophic without focal mass Spleen: Normal appearance Adrenals/Urinary Tract: Cortical scarring at inferior pole LEFT kidney. Adrenal glands, kidneys, ureters, and bladder otherwise normal appearance Stomach/Bowel: Appendix not visualized, no pericecal inflammatory process seen. Stomach decompressed. Bowel wall thickening of  descending colon with mild surrounding infiltrative changes over a long length favoring descending colitis. Minimal thickening of LEFT lateral conal fascia. Sigmoid diverticulosis without evidence of diverticulitis. Remaining bowel loops unremarkable. Vascular/Lymphatic: Atherosclerotic calcifications aorta, iliac arteries, visceral arteries. Infrarenal abdominal aortic aneurysm 4.2 x 4.0 cm in greatest axial dimensions extending 6.6 cm length, terminating above bifurcation. Moderate thrombus. No perianeurysmal infiltration/hemorrhage. No adenopathy. Reproductive: Atrophic uterus with unremarkable adnexa Other: No free air or free fluid.  No hernia. Musculoskeletal: Osseous demineralization. Review of the MIP images confirms the above findings. IMPRESSION: No evidence of pulmonary embolism. Large RIGHT breast mass 8.1 cm greatest size most consistent with malignancy with associated RIGHT axillary adenopathy. Additional small RIGHT breast nodule 14 x 7 mm question mass versus intramammary node. COPD. Descending colitis. Sigmoid diverticulosis without evidence of diverticulitis. Infrarenal abdominal aortic aneurysm 4.2 cm in greatest axial dimensions; Recommend follow-up every 12 months and vascular consultation. This recommendation follows ACR consensus guidelines: White Paper of the ACR Incidental Findings Committee II on Vascular Findings. J Am Coll Radiol 2013; 10:789-794. Emphysema (ICD10-J43.9). Aortic Atherosclerosis (ICD10-I70.0). Aortic aneurysm NOS (ICD10-I71.9).: Findings called to Dr.  Francia Greaves on 04/15/2020  at 0857 hours. Electronically Signed   By: Lavonia Dana M.D.   On: 04/15/2020 08:58   CT ABDOMEN PELVIS W CONTRAST  Result Date: 04/15/2020 CLINICAL DATA:  Abdominal pain, nonlocalized, weakness, fatigue, chronic RIGHT breast infection for years drainage EXAM: CT ANGIOGRAPHY CHEST CT ABDOMEN AND PELVIS WITH CONTRAST TECHNIQUE: Multidetector CT imaging of the chest was performed using the standard  protocol during bolus administration of intravenous contrast. Multiplanar CT image reconstructions and MIPs were obtained to evaluate the vascular anatomy. Multidetector CT imaging of the abdomen and pelvis was performed using the standard protocol during bolus administration of intravenous contrast. CONTRAST:  115mL OMNIPAQUE IOHEXOL 350 MG/ML SOLN IV. No oral contrast. COMPARISON:  None FINDINGS: CTA CHEST FINDINGS Cardiovascular: Atherosclerotic calcifications aorta, coronary arteries, and proximal great vessels. Aorta normal caliber without aneurysm or dissection. Heart unremarkable. No pericardial effusion. Pulmonary arteries adequately opacified and patent. No evidence of pulmonary embolism. Mediastinum/Nodes: Base of cervical region normal appearance. Esophagus unremarkable. Large lobulated soft tissue mass identified at RIGHT breast extending through skin surface, 8.1 x 4.9 x 7.1 cm in size most consistent with malignancy rather than infection. Mass extends to the chest wall with loss of fat plane between the mass and the underlying pectoralis muscle. Additional small nodule within RIGHT breast 14 x 7 mm question second mass versus lymph node. RIGHT axillary adenopathy with nodes measuring up to 18 mm short axis. Normal sized mediastinal lymph nodes. Lungs/Pleura: Emphysematous changes. Dependent atelectasis. Lungs otherwise clear. No pulmonary infiltrate, pleural effusion, or pneumothorax. No pulmonary mass/nodule. Musculoskeletal: No acute osseous findings. Review of the MIP images confirms the above findings. CT ABDOMEN and PELVIS FINDINGS Hepatobiliary: Gallbladder and liver normal appearance Pancreas: Atrophic without focal mass Spleen: Normal appearance Adrenals/Urinary Tract: Cortical scarring at inferior pole LEFT kidney. Adrenal glands, kidneys, ureters, and bladder otherwise normal appearance Stomach/Bowel: Appendix not visualized, no pericecal inflammatory process seen. Stomach decompressed. Bowel  wall thickening of descending colon with mild surrounding infiltrative changes over a long length favoring descending colitis. Minimal thickening of LEFT lateral conal fascia. Sigmoid diverticulosis without evidence of diverticulitis. Remaining bowel loops unremarkable. Vascular/Lymphatic: Atherosclerotic calcifications aorta, iliac arteries, visceral arteries. Infrarenal abdominal aortic aneurysm 4.2 x 4.0 cm in greatest axial dimensions extending 6.6 cm length, terminating above bifurcation. Moderate thrombus. No perianeurysmal infiltration/hemorrhage. No adenopathy. Reproductive: Atrophic uterus with unremarkable adnexa Other: No free air or free fluid.  No hernia. Musculoskeletal: Osseous demineralization. Review of the MIP images confirms the above findings. IMPRESSION: No evidence of pulmonary embolism. Large RIGHT breast mass 8.1 cm greatest size most consistent with malignancy with associated RIGHT axillary adenopathy. Additional small RIGHT breast nodule 14 x 7 mm question mass versus intramammary node. COPD. Descending colitis. Sigmoid diverticulosis without evidence of diverticulitis. Infrarenal abdominal aortic aneurysm 4.2 cm in greatest axial dimensions; Recommend follow-up every 12 months and vascular consultation. This recommendation follows ACR consensus guidelines: White Paper of the ACR Incidental Findings Committee II on Vascular Findings. J Am Coll Radiol 2013; 10:789-794. Emphysema (ICD10-J43.9). Aortic Atherosclerosis (ICD10-I70.0). Aortic aneurysm NOS (ICD10-I71.9).: Findings called to Dr.  Francia Greaves on 04/15/2020 at 0857 hours. Electronically Signed   By: Lavonia Dana M.D.   On: 04/15/2020 08:58   DG Chest Port 1 View  Result Date: 04/15/2020 CLINICAL DATA:  Recent syncopal episode EXAM: PORTABLE CHEST 1 VIEW COMPARISON:  None. FINDINGS: Cardiac shadows within normal limits. Aortic calcifications are noted. Lungs are hyperinflated bilaterally. Rounded density is noted in the right base  measuring proximally 6 cm. This  may represent fluid within the major fissure although the possibility of underlying mass deserves consideration. CT of the chest with contrast is recommended for further evaluation. IMPRESSION: Rounded density in the right lung base suspicious for underlying mass. CT is recommended for further evaluation. Electronically Signed   By: Inez Catalina M.D.   On: 04/15/2020 07:36    EKG: Independently reviewed.  Sinus rhythm at 94 bpm with QTC 489  Assessment/Plan Syncope and collapse: Acute.  Patient presented after having a syncopal event while trying to stand up to get out of the car.  Suspecting orthostatic hypotension related with dehydration as her daughter reports she does not drink enough.  Patient had been treated initially with IV fluids -Admit to a medical telemetry bed -Continue normal saline IV fluids at 70 mL/h overnight -Follow-up telemetry overnight   Right breast mass with right axillary adenopathy cellulitis of breast: Acute.  Patient noted to have a right breast mass with right axillary lymphadenopathy noted on CT imaging of the chest.  Patient with surrounding erythema and purulent-like drainage.  Suspect underlying infection for which patient was started on cefazolin.  Case discussed with Dr. Alen Blew who recommended ultrasound-guided biopsy of the breast which was ordered. -Cellulitis order set utilized -Continue cefazolin 1 g IV every 8 hours -Ultrasound guided biopsy of the right breast ordered -Definitive plan of treatment  Descending colitis: Acute.  Patient noted to have inflammation along the descending colon on imaging.  Denies having any diarrhea.  This may have been the cause of her stomach and nausea symptoms. -Added metronidazole IV  COVID-19 infection: Patient was incidentally noted to be positive for COVID-19.  Imaging of the chest did not note any signs of a pneumonia. -Airborne precautions -Check inflammatory markers -Albuterol  inhaler as needed for shortness of breath/wheeze  -Remdesivir IV day 1 of 3 -Vitamin C and zinc   Acute kidney injury: On admission creatinine mildly elevated up to 1.06.    Baseline creatinine previously noted to be 0.7.  Her daughter reports that the patient does not drink enough fluids. -IV fluids this -Recheck kidney function in a.m.  Essential hypertension: Home blood pressure medications include lisinopril 40 mg daily. -Held lisinopril due to kidney injury  Prolonged QT interval: QTC 489 on admission. -Avoid QT prolonging medication  AAA: Incidental finding noted 4.2 cm infrarenal abdominal aortic aneurysm. -Recommend vascular surgery referral with repeat imaging in 12 months  Hyperlipidemia -Continue atorvastatin  DNR/DNI: Present on admission.  GI prophylaxis: None due to prolonged prolonged QT DVT prophylaxis: Lovenox Code Status: DNR/DNI Family Communication: Sister updated over the phone Disposition Plan: Hopefully discharge home once medically stable Consults called: Case discussed with Dr. Alen Blew of oncology Admission status: Inpatient  Norval Morton MD [facility]   If 7PM-7AM, please contact night-coverage   04/15/2020, 9:32 AM

## 2020-04-16 ENCOUNTER — Inpatient Hospital Stay (HOSPITAL_COMMUNITY): Payer: Medicare HMO

## 2020-04-16 DIAGNOSIS — R7989 Other specified abnormal findings of blood chemistry: Secondary | ICD-10-CM | POA: Diagnosis not present

## 2020-04-16 DIAGNOSIS — R55 Syncope and collapse: Secondary | ICD-10-CM | POA: Diagnosis not present

## 2020-04-16 DIAGNOSIS — U071 COVID-19: Secondary | ICD-10-CM | POA: Diagnosis not present

## 2020-04-16 LAB — CBC
HCT: 31.8 % — ABNORMAL LOW (ref 36.0–46.0)
Hemoglobin: 10.4 g/dL — ABNORMAL LOW (ref 12.0–15.0)
MCH: 29.7 pg (ref 26.0–34.0)
MCHC: 32.7 g/dL (ref 30.0–36.0)
MCV: 90.9 fL (ref 80.0–100.0)
Platelets: 255 10*3/uL (ref 150–400)
RBC: 3.5 MIL/uL — ABNORMAL LOW (ref 3.87–5.11)
RDW: 13.7 % (ref 11.5–15.5)
WBC: 8.1 10*3/uL (ref 4.0–10.5)
nRBC: 0 % (ref 0.0–0.2)

## 2020-04-16 LAB — BASIC METABOLIC PANEL
Anion gap: 7 (ref 5–15)
BUN: 13 mg/dL (ref 8–23)
CO2: 20 mmol/L — ABNORMAL LOW (ref 22–32)
Calcium: 7.8 mg/dL — ABNORMAL LOW (ref 8.9–10.3)
Chloride: 107 mmol/L (ref 98–111)
Creatinine, Ser: 0.79 mg/dL (ref 0.44–1.00)
GFR, Estimated: >60 mL/min (ref >60)
Glucose, Bld: 113 mg/dL — ABNORMAL HIGH (ref 70–99)
Potassium: 3.9 mmol/L (ref 3.5–5.1)
Sodium: 134 mmol/L — ABNORMAL LOW (ref 135–145)

## 2020-04-16 MED ORDER — LORAZEPAM 2 MG/ML IJ SOLN: 1.0000 mg | Freq: Once | INTRAMUSCULAR | Status: DC | PRN

## 2020-04-16 MED ORDER — SODIUM CHLORIDE 0.9 % IV SOLN: INTRAVENOUS | Status: AC

## 2020-04-16 MED ORDER — HEPARIN SODIUM (PORCINE) 5000 UNIT/ML IJ SOLN: 5000.0000 [IU] | Freq: Three times a day (TID) | INTRAMUSCULAR | Status: DC

## 2020-04-16 MED ORDER — SILVER NITRATE-POT NITRATE 75-25 % EX MISC
10.0000 | CUTANEOUS | Status: DC | PRN
  Filled 2020-04-16: qty 10

## 2020-04-16 MED ORDER — HEPARIN SODIUM (PORCINE) 5000 UNIT/ML IJ SOLN
5000.0000 [IU] | Freq: Three times a day (TID) | INTRAMUSCULAR | Status: DC
  Administered 2020-04-16 – 2020-04-26 (×25): 5000 [IU] via SUBCUTANEOUS
  Filled 2020-04-16 (×28): qty 1

## 2020-04-16 NOTE — CV Procedure (Signed)
BLE Venous Duplex completed.  Results can be found under chart review under CV PROC. 04/16/2020 4:41 PM Evah Rashid RVT, RDMS

## 2020-04-16 NOTE — Consult Note (Signed)
[facility] Surgery Consult Note  Elizabeth Chase [birth year]  [id number].    Requesting MD: Lala Lund Chief Complaint/Reason for Consult: breast mass  HPI:  Elizabeth Chase is an 80yo female who is homeless (currently living in a car with her daughter) with PMH HTN and HLD who presented to [facility] 3/3 after a syncopal event. States that this has never happened before. She was in her normal state of health that day when she passed out for unknown length of time. Denies falling or hitting her head. Denies prodromal event. Denies fever, chills, weakness, CP, SOB, cough, or recent illness. She was admitted to the medical service for further work up. Incidentally noted to be covid+. Patient also found to have a right breast mass for which general surgery was called to evaluate. States that this has been present for about 2 years and she has never had it evaluated. No known h/o breast cancer. At times it will drain. States that she keeps it covered with toilet paper. CT scan revealed large right breast mass 8.1 cm greatest size most consistent with malignancy with associated right axillary adenopathy.  Review of Systems  Constitutional: Positive for malaise/fatigue and weight loss.  Skin:       Right breast mass  Neurological: Positive for loss of consciousness.   All systems reviewed and otherwise negative except for as above  Family History  Problem Relation Age of Onset  . Diabetes Mother   . Cancer Father     Past Medical History:  Diagnosis Date  . Hypertension     History reviewed. No pertinent surgical history.  Social History:  reports that she quit smoking about 3 years ago. Her smoking use included cigarettes. She smoked 1.00 pack per day. She has never used smokeless tobacco. She reports that she does not drink alcohol and does not use drugs.  Allergies:  Allergies  Allergen Reactions  . Bee Venom     Medications Prior to Admission  Medication Sig Dispense Refill  .  aspirin 325 MG EC tablet Take 1 tablet (325 mg total) by mouth daily. 30 tablet 0  . atorvastatin (LIPITOR) 20 MG tablet Take 1 tablet (20 mg total) by mouth daily at 6 PM. 30 tablet 0  . cetirizine (ZYRTEC) 10 MG tablet Take 10 mg daily by mouth.    . cyanocobalamin 1000 MCG tablet Take 1,000 mcg by mouth daily.    . fluticasone furoate-vilanterol (BREO ELLIPTA) 200-25 MCG/INH AEPB Inhale 1 puff into the lungs daily.    Marland Kitchen lisinopril (ZESTRIL) 40 MG tablet Take 40 mg by mouth daily.    . trazodone (DESYREL) 300 MG tablet Take 300 mg by mouth at bedtime.    . ergocalciferol (VITAMIN D2) 1.25 MG (50000 UT) capsule Take 50,000 Units by mouth once a week. (Patient not taking: Reported on 04/15/2020)      Prior to Admission medications   Medication Sig Start Date End Date Taking? Authorizing Provider  aspirin 325 MG EC tablet Take 1 tablet (325 mg total) by mouth daily. 01/03/17  Yes Mikhail, Velta Addison, DO  atorvastatin (LIPITOR) 20 MG tablet Take 1 tablet (20 mg total) by mouth daily at 6 PM. 01/03/17  Yes Mikhail, Velta Addison, DO  cetirizine (ZYRTEC) 10 MG tablet Take 10 mg daily by mouth.   Yes [provider]  cyanocobalamin 1000 MCG tablet Take 1,000 mcg by mouth daily. 03/26/19  Yes [provider]  fluticasone furoate-vilanterol (BREO ELLIPTA) 200-25 MCG/INH AEPB Inhale 1 puff into  the lungs daily. 09/10/19  Yes [provider]  lisinopril (ZESTRIL) 40 MG tablet Take 40 mg by mouth daily. 04/14/20  Yes [provider]  trazodone (DESYREL) 300 MG tablet Take 300 mg by mouth at bedtime. 04/14/20  Yes [provider]  ergocalciferol (VITAMIN D2) 1.25 MG (50000 UT) capsule Take 50,000 Units by mouth once a week. Patient not taking: Reported on 04/15/2020 02/24/19   [provider]    Blood pressure (!) 126/57, pulse 95, temperature 98.3 F (36.8 C), temperature source Oral, resp. rate (!) 21, height 5\' 5"  (1.651 m), weight 54.9 kg, SpO2 95 %. Physical  Exam: General: pleasant, frail female who is laying in bed in NAD HEENT: head is normocephalic, atraumatic.  Sclera are noninjected.  PERRL.  Ears and nose without any masses or lesions.  Mouth is pink and moist. Dentition fair Heart: regular, rate, and rhythm.  Normal s1,s2. No obvious murmurs, gallops, or rubs noted.  Palpable pedal pulses bilaterally  Lungs: CTAB, no wheezes, rhonchi, or rales noted.  Respiratory effort nonlabored Abd: soft, NT/ND, +BS, no masses, hernias, or organomegaly MS: no BUE/BLE edema, calves soft and nontender Skin: warm and dry with no masses, lesions, or rashes Psych: A&Ox4 with an appropriate affect Neuro: cranial nerves grossly intact, equal strength in BUE/BLE bilaterally, normal speech, thought process intact Breast: large right breast mass with foul odor, trace cellulitis, and minimal bleeding that stops with pressure      Results for orders placed or performed during the hospital encounter of 04/14/20 (from the past 48 hour(s))  CBC with Differential     Status: None   Collection Time: 04/14/20 10:59 PM  Result Value Ref Range   WBC 7.8 4.0 - 10.5 K/uL   RBC 4.24 3.87 - 5.11 MIL/uL   Hemoglobin 12.0 12.0 - 15.0 g/dL   HCT 39.3 36.0 - 46.0 %   MCV 92.7 80.0 - 100.0 fL   MCH 28.3 26.0 - 34.0 pg   MCHC 30.5 30.0 - 36.0 g/dL   RDW 13.5 11.5 - 15.5 %   Platelets 336 150 - 400 K/uL   nRBC 0.0 0.0 - 0.2 %   Neutrophils Relative % 79 %   Neutro Abs 6.2 1.7 - 7.7 K/uL   Lymphocytes Relative 13 %   Lymphs Abs 1.0 0.7 - 4.0 K/uL   Monocytes Relative 5 %   Monocytes Absolute 0.4 0.1 - 1.0 K/uL   Eosinophils Relative 1 %   Eosinophils Absolute 0.1 0.0 - 0.5 K/uL   Basophils Relative 1 %   Basophils Absolute 0.0 0.0 - 0.1 K/uL   Immature Granulocytes 1 %   Abs Immature Granulocytes 0.04 0.00 - 0.07 K/uL    Comment: Performed at [facility], [address], [place], [place] [address]  Comprehensive metabolic panel     Status: Abnormal    Collection Time: 04/14/20 10:59 PM  Result Value Ref Range   Sodium 139 135 - 145 mmol/L   Potassium 3.6 3.5 - 5.1 mmol/L   Chloride 104 98 - 111 mmol/L   CO2 23 22 - 32 mmol/L   Glucose, Bld 212 (H) 70 - 99 mg/dL    Comment: Glucose reference range applies only to samples taken after fasting for at least 8 hours.   BUN 18 8 - 23 mg/dL   Creatinine, Ser 1.06 (H) 0.44 - 1.00 mg/dL   Calcium 9.6 8.9 - 10.3 mg/dL   Total Protein 6.6 6.5 - 8.1 g/dL  Albumin 3.4 (L) 3.5 - 5.0 g/dL   AST 20 15 - 41 U/L   ALT 14 0 - 44 U/L   Alkaline Phosphatase 53 38 - 126 U/L   Total Bilirubin 0.7 0.3 - 1.2 mg/dL   GFR, Estimated 53 (L) >60 mL/min    Comment: (NOTE) Calculated using the CKD-EPI Creatinine Equation (2021)    Anion gap 12 5 - 15    Comment: Performed at [facility] [address], [place], [place] [address]  Culture, blood (routine x 2)     Status: None (Preliminary result)   Collection Time: 04/15/20  7:42 AM   Specimen: BLOOD  Result Value Ref Range   Specimen Description BLOOD LEFT ANTECUBITAL    Special Requests      BOTTLES DRAWN AEROBIC AND ANAEROBIC Blood Culture adequate volume   Culture      NO GROWTH < 24 HOURS Performed at [facility], [place] [address], [place], [place] [address]    Report Status PENDING   Troponin I (High Sensitivity)     Status: None   Collection Time: 04/15/20  7:42 AM  Result Value Ref Range   Troponin I (High Sensitivity) 12 <18 ng/L    Comment: (NOTE) Elevated high sensitivity troponin I (hsTnI) values and significant  changes across serial measurements may suggest ACS but many other  chronic and acute conditions are known to elevate hsTnI results.  Refer to the "Links" section for chest pain algorithms and additional  guidance. Performed at [facility], [place] [address], [place], [place] [address]   Lactic acid, plasma     Status: Abnormal   Collection Time: 04/15/20  7:42 AM  Result Value Ref Range   Lactic Acid,  Venous 2.2 (HH) 0.5 - 1.9 mmol/L    Comment: CRITICAL RESULT CALLED TO, READ BACK BY AND VERIFIED WITH: S.BERTRAND,RN [id number] 04/15/20 CLARK,S Performed at [facility], [place] [address], [place], [place] [address]   Protime-INR     Status: None   Collection Time: 04/15/20  7:42 AM  Result Value Ref Range   Prothrombin Time 13.3 11.4 - 15.2 seconds   INR 1.1 0.8 - 1.2    Comment: (NOTE) INR goal varies based on device and disease states. Performed at [facility], [place] [address] [place], [place] [address]   Resp Panel by RT-PCR (Flu A&B, Covid) Nasopharyngeal Swab     Status: Abnormal   Collection Time: 04/15/20  7:42 AM   Specimen: Nasopharyngeal Swab; Nasopharyngeal(NP) swabs in vial transport medium  Result Value Ref Range   SARS Coronavirus 2 by RT PCR POSITIVE (A) NEGATIVE    Comment: RESULT CALLED TO, READ BACK BY AND VERIFIED WITH: RN S.BETRAND ON 04/15/2020 AT 0926 BY E.PARRISH (NOTE) SARS-CoV-2 target nucleic acids are DETECTED.  The SARS-CoV-2 RNA is generally detectable in upper respiratory specimens during the acute phase of infection. Positive results are indicative of the presence of the identified virus, but do not rule out bacterial infection or co-infection with other pathogens not detected by the test. Clinical correlation with patient history and other diagnostic information is necessary to determine patient infection status. The expected result is Negative.  Fact Sheet for Patients: EntrepreneurPulse.com.au  Fact Sheet for Healthcare Providers: IncredibleEmployment.be  This test is not yet approved or cleared by the [place] FDA and  has been authorized for detection and/or diagnosis of SARS-CoV-2 by FDA under an Emergency Use Authorization (EUA).  This EUA  will remain in effect (meaning this  test can be used) for the duration of  the COVID-19 declaration under Section 564(b)(1) of the Act, 21 U.S.C. section  360bbb-3(b)(1), unless the authorization is terminated or revoked sooner.     Influenza A by PCR NEGATIVE NEGATIVE   Influenza B by PCR NEGATIVE NEGATIVE    Comment: (NOTE) The Xpert Xpress SARS-CoV-2/FLU/RSV plus assay is intended as an aid in the diagnosis of influenza from Nasopharyngeal swab specimens and should not be used as a sole basis for treatment. Nasal washings and aspirates are unacceptable for Xpert Xpress SARS-CoV-2/FLU/RSV testing.  Fact Sheet for Patients: EntrepreneurPulse.com.au  Fact Sheet for Healthcare Providers: IncredibleEmployment.be  This test is not yet approved or cleared by the [place] FDA and has been authorized for detection and/or diagnosis of SARS-CoV-2 by FDA under an Emergency Use Authorization (EUA). This EUA will remain in effect (meaning this test can be used) for the duration of the COVID-19 declaration under Section 564(b)(1) of the Act, 21 U.S.C. section 360bbb-3(b)(1), unless the authorization is terminated or revoked.  Performed at [facility], [place] [address] [place], [place] [address]   Culture, blood (routine x 2)     Status: None (Preliminary result)   Collection Time: 04/15/20  7:43 AM   Specimen: BLOOD RIGHT HAND  Result Value Ref Range   Specimen Description BLOOD RIGHT HAND    Special Requests      BOTTLES DRAWN AEROBIC ONLY Blood Culture results may not be optimal due to an inadequate volume of blood received in culture bottles   Culture      NO GROWTH < 24 HOURS Performed at [facility] [address] [place], [place] [address]    Report Status PENDING   Lactic acid, plasma     Status: None   Collection Time: 04/15/20  9:18 AM  Result Value Ref Range   Lactic Acid, Venous 1.7 0.5 - 1.9 mmol/L    Comment: Performed at [facility] [address] [place], [place] [address]  Troponin I (High Sensitivity)     Status: None   Collection Time: 04/15/20  9:18  AM  Result Value Ref Range   Troponin I (High Sensitivity) 13 <18 ng/L    Comment: (NOTE) Elevated high sensitivity troponin I (hsTnI) values and significant  changes across serial measurements may suggest ACS but many other  chronic and acute conditions are known to elevate hsTnI results.  Refer to the "Links" section for chest pain algorithms and additional  guidance. Performed at [facility], [place] [address] [place], [place] [address]   TSH     Status: None   Collection Time: 04/15/20 12:54 PM  Result Value Ref Range   TSH 0.779 0.350 - 4.500 uIU/mL    Comment: Performed by a 3rd Generation assay with a functional sensitivity of <=0.01 uIU/mL. Performed at [facility], [place] [address], [place], [place] [address]   Brain natriuretic peptide     Status: None   Collection Time: 04/15/20 12:54 PM  Result Value Ref Range   B Natriuretic Peptide 64.0 0.0 - 100.0 pg/mL    Comment: Performed at [facility] [address], [place],  [address]  Hepatitis B surface antigen     Status: None   Collection Time: 04/15/20 12:54 PM  Result Value Ref Range   Hepatitis B Surface Ag NON REACTIVE NON REACTIVE    Comment: Performed at [facility], [place] [address] [place], [place] [address]  Sedimentation rate     Status: None   Collection Time: 04/15/20  1:02 PM  Result Value Ref Range   Sed Rate 20 0 - 22 mm/hr    Comment: Performed at [facility] [address] [place], [place] [address]  C-reactive protein     Status: Abnormal   Collection Time: 04/15/20  1:02 PM  Result Value Ref Range   CRP 4.3 (H) <1.0 mg/dL    Comment: Performed at [facility] [address] [place], [place] [address]  Procalcitonin     Status: None   Collection Time: 04/15/20  1:02 PM  Result Value Ref Range   Procalcitonin 0.10 ng/mL    Comment:        Interpretation: PCT (Procalcitonin) <= 0.5 ng/mL: Systemic infection (sepsis) is not likely. Local bacterial  infection is possible. (NOTE)       Sepsis PCT Algorithm           Lower Respiratory Tract                                      Infection PCT Algorithm    ----------------------------     ----------------------------         PCT < 0.25 ng/mL                PCT < 0.10 ng/mL          Strongly encourage             Strongly discourage   discontinuation of antibiotics    initiation of antibiotics    ----------------------------     -----------------------------       PCT 0.25 - 0.50 ng/mL            PCT 0.10 - 0.25 ng/mL               OR       >80% decrease in PCT            Discourage initiation of                                            antibiotics      Encourage discontinuation           of antibiotics    ----------------------------     -----------------------------         PCT >= 0.50 ng/mL              PCT 0.26 - 0.50 ng/mL               AND        <80% decrease in PCT             Encourage initiation of                                             antibiotics       Encourage continuation           of antibiotics    ----------------------------     -----------------------------        PCT >= 0.50  ng/mL                  PCT > 0.50 ng/mL               AND         increase in PCT                  Strongly encourage                                      initiation of antibiotics    Strongly encourage escalation           of antibiotics                                     -----------------------------                                           PCT <= 0.25 ng/mL                                                 OR                                        > 80% decrease in PCT                                      Discontinue / Do not initiate                                             antibiotics  Performed at [facility], [address] [place], [place] [address]   Lactate dehydrogenase     Status: Abnormal   Collection Time: 04/15/20  1:02 PM  Result Value Ref Range    LDH 212 (H) 98 - 192 U/L    Comment: Performed at [facility], [place] [address] [place], [place] [address]  D-dimer, quantitative     Status: Abnormal   Collection Time: 04/15/20  1:02 PM  Result Value Ref Range   D-Dimer, Quant 3.46 (H) 0.00 - 0.50 ug/mL-FEU    Comment: (NOTE) At the manufacturer cut-off value of 0.5 g/mL FEU, this assay has a negative predictive value of 95-100%.This assay is intended for use in conjunction with a clinical pretest probability (PTP) assessment model to exclude pulmonary embolism (PE) and deep venous thrombosis (DVT) in outpatients suspected of PE or DVT. Results should be correlated with clinical presentation. Performed at [facility], [place] [address] [place], [place] [address]   Fibrinogen     Status: None   Collection Time: 04/15/20  1:02 PM  Result Value Ref Range   Fibrinogen 375 210 - 475 mg/dL    Comment: Performed at [facility], [address] [place], [place] [address]  Ferritin     Status: None   Collection Time: 04/15/20  1:02 PM  Result Value Ref Range   Ferritin 94 11 - 307 ng/mL    Comment: Performed at [facility] [address] [place], [place] [address]  CBC     Status: Abnormal   Collection Time: 04/16/20  4:31 AM  Result Value Ref Range   WBC 8.1 4.0 - 10.5 K/uL   RBC 3.50 (L) 3.87 - 5.11 MIL/uL   Hemoglobin 10.4 (L) 12.0 - 15.0 g/dL   HCT 31.8 (L) 36.0 - 46.0 %   MCV 90.9 80.0 - 100.0 fL   MCH 29.7 26.0 - 34.0 pg   MCHC 32.7 30.0 - 36.0 g/dL   RDW 13.7 11.5 - 15.5 %   Platelets 255 150 - 400 K/uL   nRBC 0.0 0.0 - 0.2 %    Comment: Performed at [facility], [place] [address] [place], [place] [address]  Basic metabolic panel     Status: Abnormal   Collection Time: 04/16/20  4:31 AM  Result Value Ref Range   Sodium 134 (L) 135 - 145 mmol/L   Potassium 3.9 3.5 - 5.1 mmol/L   Chloride 107 98 - 111 mmol/L   CO2 20 (L) 22 - 32 mmol/L   Glucose, Bld 113 (H) 70 - 99 mg/dL    Comment:  Glucose reference range applies only to samples taken after fasting for at least 8 hours.   BUN 13 8 - 23 mg/dL   Creatinine, Ser 0.79 0.44 - 1.00 mg/dL   Calcium 7.8 (L) 8.9 - 10.3 mg/dL   GFR, Estimated >60 >60 mL/min    Comment: (NOTE) Calculated using the CKD-EPI Creatinine Equation (2021)    Anion gap 7 5 - 15    Comment: Performed at [facility] [address] [place], [place] [address]   CT Head Wo Contrast  Result Date: 04/15/2020 CLINICAL DATA:  Syncope with normal neuro exam EXAM: CT HEAD WITHOUT CONTRAST TECHNIQUE: Contiguous axial images were obtained from the base of the skull through the vertex without intravenous contrast. COMPARISON:  Brain MRI 01/02/2017 FINDINGS: Brain: No evidence of acute infarction, hemorrhage, hydrocephalus, extra-axial collection or mass effect. 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 Stable calcification in the right posterior frontal white matter age congruent cerebral volume loss. Known meningioma at the right vertex again measuring 9 mm. Vascular: No hyperdense vessel or unexpected calcification. Skull: Normal. Negative for fracture or focal lesion. Sinuses/Orbits: No acute finding. IMPRESSION: No acute finding or change from 2018 Electronically Signed   By: Monte Fantasia M.D.   On: 04/15/2020 08:42   CT Angio Chest PE W and/or Wo Contrast  Result Date: 04/15/2020 CLINICAL DATA:  Abdominal pain, nonlocalized, weakness, fatigue, chronic RIGHT breast infection for years drainage EXAM: CT ANGIOGRAPHY CHEST CT ABDOMEN AND PELVIS WITH CONTRAST TECHNIQUE: Multidetector CT imaging of the chest was performed using the standard protocol during bolus administration of intravenous contrast. Multiplanar CT image reconstructions and MIPs were obtained to evaluate the vascular anatomy. Multidetector CT imaging of the abdomen and pelvis was performed using the standard protocol during bolus administration of intravenous  contrast. CONTRAST:  134mL OMNIPAQUE IOHEXOL 350 MG/ML SOLN IV. No oral contrast. COMPARISON:  None FINDINGS: CTA CHEST FINDINGS Cardiovascular: Atherosclerotic calcifications aorta, coronary arteries, and proximal great vessels. Aorta normal caliber without aneurysm or dissection. Heart unremarkable. No pericardial effusion.  Pulmonary arteries adequately opacified and patent. No evidence of pulmonary embolism. Mediastinum/Nodes: Base of cervical region normal appearance. Esophagus unremarkable. Large lobulated soft tissue mass identified at RIGHT breast extending through skin surface, 8.1 x 4.9 x 7.1 cm in size most consistent with malignancy rather than infection. Mass extends to the chest wall with loss of fat plane between the mass and the underlying pectoralis muscle. Additional small nodule within RIGHT breast 14 x 7 mm question second mass versus lymph node. RIGHT axillary adenopathy with nodes measuring up to 18 mm short axis. Normal sized mediastinal lymph nodes. Lungs/Pleura: Emphysematous changes. Dependent atelectasis. Lungs otherwise clear. No pulmonary infiltrate, pleural effusion, or pneumothorax. No pulmonary mass/nodule. Musculoskeletal: No acute osseous findings. Review of the MIP images confirms the above findings. CT ABDOMEN and PELVIS FINDINGS Hepatobiliary: Gallbladder and liver normal appearance Pancreas: Atrophic without focal mass Spleen: Normal appearance Adrenals/Urinary Tract: Cortical scarring at inferior pole LEFT kidney. Adrenal glands, kidneys, ureters, and bladder otherwise normal appearance Stomach/Bowel: Appendix not visualized, no pericecal inflammatory process seen. Stomach decompressed. Bowel wall thickening of descending colon with mild surrounding infiltrative changes over a long length favoring descending colitis. Minimal thickening of LEFT lateral conal fascia. Sigmoid diverticulosis without evidence of diverticulitis. Remaining bowel loops unremarkable. Vascular/Lymphatic:  Atherosclerotic calcifications aorta, iliac arteries, visceral arteries. Infrarenal abdominal aortic aneurysm 4.2 x 4.0 cm in greatest axial dimensions extending 6.6 cm length, terminating above bifurcation. Moderate thrombus. No perianeurysmal infiltration/hemorrhage. No adenopathy. Reproductive: Atrophic uterus with unremarkable adnexa Other: No free air or free fluid.  No hernia. Musculoskeletal: Osseous demineralization. Review of the MIP images confirms the above findings. IMPRESSION: No evidence of pulmonary embolism. Large RIGHT breast mass 8.1 cm greatest size most consistent with malignancy with associated RIGHT axillary adenopathy. Additional small RIGHT breast nodule 14 x 7 mm question mass versus intramammary node. COPD. Descending colitis. Sigmoid diverticulosis without evidence of diverticulitis. Infrarenal abdominal aortic aneurysm 4.2 cm in greatest axial dimensions; Recommend follow-up every 12 months and vascular consultation. This recommendation follows ACR consensus guidelines: White Paper of the ACR Incidental Findings Committee II on Vascular Findings. J Am Coll Radiol 2013; 10:789-794. Emphysema (ICD10-J43.9). Aortic Atherosclerosis (ICD10-I70.0). Aortic aneurysm NOS (ICD10-I71.9).: Findings called to Dr.  Francia Greaves on 04/15/2020 at 0857 hours. Electronically Signed   By: Lavonia Dana M.D.   On: 04/15/2020 08:58   CT ABDOMEN PELVIS W CONTRAST  Result Date: 04/15/2020 CLINICAL DATA:  Abdominal pain, nonlocalized, weakness, fatigue, chronic RIGHT breast infection for years drainage EXAM: CT ANGIOGRAPHY CHEST CT ABDOMEN AND PELVIS WITH CONTRAST TECHNIQUE: Multidetector CT imaging of the chest was performed using the standard protocol during bolus administration of intravenous contrast. Multiplanar CT image reconstructions and MIPs were obtained to evaluate the vascular anatomy. Multidetector CT imaging of the abdomen and pelvis was performed using the standard protocol during bolus administration  of intravenous contrast. CONTRAST:  196mL OMNIPAQUE IOHEXOL 350 MG/ML SOLN IV. No oral contrast. COMPARISON:  None FINDINGS: CTA CHEST FINDINGS Cardiovascular: Atherosclerotic calcifications aorta, coronary arteries, and proximal great vessels. Aorta normal caliber without aneurysm or dissection. Heart unremarkable. No pericardial effusion. Pulmonary arteries adequately opacified and patent. No evidence of pulmonary embolism. Mediastinum/Nodes: Base of cervical region normal appearance. Esophagus unremarkable. Large lobulated soft tissue mass identified at RIGHT breast extending through skin surface, 8.1 x 4.9 x 7.1 cm in size most consistent with malignancy rather than infection. Mass extends to the chest wall with loss of fat plane between the mass and the underlying pectoralis muscle. Additional small nodule  within RIGHT breast 14 x 7 mm question second mass versus lymph node. RIGHT axillary adenopathy with nodes measuring up to 18 mm short axis. Normal sized mediastinal lymph nodes. Lungs/Pleura: Emphysematous changes. Dependent atelectasis. Lungs otherwise clear. No pulmonary infiltrate, pleural effusion, or pneumothorax. No pulmonary mass/nodule. Musculoskeletal: No acute osseous findings. Review of the MIP images confirms the above findings. CT ABDOMEN and PELVIS FINDINGS Hepatobiliary: Gallbladder and liver normal appearance Pancreas: Atrophic without focal mass Spleen: Normal appearance Adrenals/Urinary Tract: Cortical scarring at inferior pole LEFT kidney. Adrenal glands, kidneys, ureters, and bladder otherwise normal appearance Stomach/Bowel: Appendix not visualized, no pericecal inflammatory process seen. Stomach decompressed. Bowel wall thickening of descending colon with mild surrounding infiltrative changes over a long length favoring descending colitis. Minimal thickening of LEFT lateral conal fascia. Sigmoid diverticulosis without evidence of diverticulitis. Remaining bowel loops unremarkable.  Vascular/Lymphatic: Atherosclerotic calcifications aorta, iliac arteries, visceral arteries. Infrarenal abdominal aortic aneurysm 4.2 x 4.0 cm in greatest axial dimensions extending 6.6 cm length, terminating above bifurcation. Moderate thrombus. No perianeurysmal infiltration/hemorrhage. No adenopathy. Reproductive: Atrophic uterus with unremarkable adnexa Other: No free air or free fluid.  No hernia. Musculoskeletal: Osseous demineralization. Review of the MIP images confirms the above findings. IMPRESSION: No evidence of pulmonary embolism. Large RIGHT breast mass 8.1 cm greatest size most consistent with malignancy with associated RIGHT axillary adenopathy. Additional small RIGHT breast nodule 14 x 7 mm question mass versus intramammary node. COPD. Descending colitis. Sigmoid diverticulosis without evidence of diverticulitis. Infrarenal abdominal aortic aneurysm 4.2 cm in greatest axial dimensions; Recommend follow-up every 12 months and vascular consultation. This recommendation follows ACR consensus guidelines: White Paper of the ACR Incidental Findings Committee II on Vascular Findings. J Am Coll Radiol 2013; 10:789-794. Emphysema (ICD10-J43.9). Aortic Atherosclerosis (ICD10-I70.0). Aortic aneurysm NOS (ICD10-I71.9).: Findings called to Dr.  Francia Greaves on 04/15/2020 at 0857 hours. Electronically Signed   By: Lavonia Dana M.D.   On: 04/15/2020 08:58   DG Chest Port 1 View  Result Date: 04/15/2020 CLINICAL DATA:  Recent syncopal episode EXAM: PORTABLE CHEST 1 VIEW COMPARISON:  None. FINDINGS: Cardiac shadows within normal limits. Aortic calcifications are noted. Lungs are hyperinflated bilaterally. Rounded density is noted in the right base measuring proximally 6 cm. This may represent fluid within the major fissure although the possibility of underlying mass deserves consideration. CT of the chest with contrast is recommended for further evaluation. IMPRESSION: Rounded density in the right lung base suspicious  for underlying mass. CT is recommended for further evaluation. Electronically Signed   By: Inez Catalina M.D.   On: 04/15/2020 07:36    Anti-infectives (From admission, onward)   Start     Dose/Rate Route Frequency Ordered Stop   04/16/20 1000  remdesivir 100 mg in sodium chloride 0.9 % 100 mL IVPB       "Followed by" Linked Group Details   100 mg 200 mL/hr over 30 Minutes Intravenous Daily 04/15/20 1203 04/18/20 0959   04/15/20 1915  metroNIDAZOLE (FLAGYL) IVPB 500 mg        500 mg 100 mL/hr over 60 Minutes Intravenous Every 8 hours 04/15/20 1822     04/15/20 1630  ceFAZolin (ANCEF) IVPB 1 g/50 mL premix        1 g 100 mL/hr over 30 Minutes Intravenous Every 8 hours 04/15/20 1154     04/15/20 1400  remdesivir 200 mg in sodium chloride 0.9% 250 mL IVPB       "Followed by" Linked Group Details   200 mg 580 mL/hr  over 30 Minutes Intravenous Once 04/15/20 1203 04/15/20 2101   04/15/20 0745  ceFAZolin (ANCEF) IVPB 1 g/50 mL premix        1 g 100 mL/hr over 30 Minutes Intravenous  Once 04/15/20 0732 04/15/20 0902       Assessment/Plan HTN HLD Anemia COVID+ Homeless Syncope  Fungating right breast mass - present x2 years, never been worked up, no known h/o breast cancer - CT scan revealed large right breast mass 8.1 cm greatest size most consistent with malignancy with associated right axillary adenopathy - Minimal bleeding on exam, hemostasis achieved by holding pressure. Will order some silver nitrate sticks to have at bedside to use PRN bleeding. Will ask OR to send up breast binder to make wound care easier. Will also ask WOC to see for recommendations.  - No role for acute surgical intervention. Recommend radiation and medical oncology consults for recommendations.   ID - currently ancef, flagyl VTE - SCDs, sq heparin FEN - reg diet Foley - none Follow up - TBD   [place]ellington [place], [facility] Surgery 04/16/2020, 12:16 PM Please see Amion for pager number during  day hours 7:00am-4:30pm

## 2020-04-16 NOTE — Progress Notes (Signed)
PROGRESS NOTE                                                                                                                                                                                                             Patient Demographics:    Elizabeth Chase, is a 80 y.o. female, DOB - 12-29-1940, ZMO:[id number]  Outpatient Primary MD for the patient is Lesleigh Noe, MD    LOS - 1  Admit date - 04/14/2020    Chief Complaint  Patient presents with  . Breast Infection        Brief Narrative (HPI from H&P)  - MARLON VONRUDEN is a 80 y.o. female with medical history significant of hypertension and hyperlipidemia who presented episode of syncope where she stood up to walk and almost passed out, was caught by her daughter on her way down, lost consciousness for a few minutes with no bowel or bladder incontinence, in the ER she was found to be dehydrated and hypotensive, on exam she was found to have a fungating right breast mass, imaging suggested colitis along with right breast malignancy and she was admitted to the hospital.   Subjective:    Elon Alas today has, No headache, No chest pain, No abdominal pain - No Nausea, No new weakness tingling or numbness, no SOB.   Assessment  & Plan :     1.  Syncope due to hypotension.  Caused by dehydration and possible mild colitis.  Currently no diarrhea, has been hydrated with IV fluids and is feeling better already, continue hydration, PT OT and monitor.  Head CT is nonacute and exam is nonfocal  2.  Possible colitis.  Currently no diarrhea.  Agree with IV Flagyl and cephalosporin continue.  3.  Fungating right breast mass with right axillary lymphadenopathy.  Highly suspicious for malignancy.  Will involve general surgery for further diagnostic evaluation, also there is some bleeding around the fungating mass.  4.  Left wrist swelling appears to be a hematoma at the site of IV.  IV  removed, will check left wrist x-ray to rule out any bony injury.  5. Fully vaccinated with incidental Covid infection.  Agree with 3 days of remdesivir.  6.  Elevated D-dimer.  Check leg venous ultrasound, holding Lovenox as right breast masses bleeding, transition to heparin and SCDs and  monitor closely.  CTA lungs is negative.    Recent Labs  Lab 04/14/20 2259 04/16/20 0431  WBC 7.8 8.1  HGB 12.0 10.4*  HCT 39.3 31.8*  PLT 336 255  MCV 92.7 90.9  MCH 28.3 29.7  MCHC 30.5 32.7  RDW 13.5 13.7  LYMPHSABS 1.0  --   MONOABS 0.4  --   EOSABS 0.1  --   BASOSABS 0.0  --     Recent Labs  Lab 04/14/20 2259 04/15/20 0742 04/15/20 0918 04/15/20 1254 04/15/20 1302 04/16/20 0431  NA 139  --   --   --   --  134*  K 3.6  --   --   --   --  3.9  CL 104  --   --   --   --  107  CO2 23  --   --   --   --  20*  GLUCOSE 212*  --   --   --   --  113*  BUN 18  --   --   --   --  13  CREATININE 1.06*  --   --   --   --  0.79  CALCIUM 9.6  --   --   --   --  7.8*  AST 20  --   --   --   --   --   ALT 14  --   --   --   --   --   ALKPHOS 53  --   --   --   --   --   BILITOT 0.7  --   --   --   --   --   ALBUMIN 3.4*  --   --   --   --   --   CRP  --   --   --   --  4.3*  --   DDIMER  --   --   --   --  3.46*  --   PROCALCITON  --   --   --   --  0.10  --   LATICACIDVEN  --  2.2* 1.7  --   --   --   INR  --  1.1  --   --   --   --   TSH  --   --   --  0.779  --   --   BNP  --   --   --  64.0  --   --     Recent Labs  Lab 04/14/20 2259 04/15/20 0742 04/15/20 0918 04/15/20 1254 04/15/20 1302 04/16/20 0431  WBC 7.8  --   --   --   --  8.1  HGB 12.0  --   --   --   --  10.4*  HCT 39.3  --   --   --   --  31.8*  PLT 336  --   --   --   --  255  CRP  --   --   --   --  4.3*  --   BNP  --   --   --  64.0  --   --   DDIMER  --   --   --   --  3.46*  --   PROCALCITON  --   --   --   --  0.10  --   AST 20  --   --   --   --   --  ALT 14  --   --   --   --   --   ALKPHOS 53   --   --   --   --   --   BILITOT 0.7  --   --   --   --   --   ALBUMIN 3.4*  --   --   --   --   --   INR  --  1.1  --   --   --   --   LATICACIDVEN  --  2.2* 1.7  --   --   --   SARSCOV2NAA  --  POSITIVE*  --   --   --   --             Condition - Extremely Guarded  Family Communication  :  Daughter bedside  Code Status :  Full  Consults  :  CCS, IR  PUD Prophylaxis :    Procedures  :     CT head - Non acute  CT Chest - Abd - Pelvis - No evidence of pulmonary embolism. Large RIGHT breast mass 8.1 cm greatest size most consistent with malignancy with associated RIGHT axillary adenopathy. Additional small RIGHT breast nodule 14 x 7 mm question mass versus intramammary node. COPD. Descending colitis. Sigmoid diverticulosis without evidence of diverticulitis. Infrarenal abdominal aortic aneurysm 4.2 cm in greatest axial dimensions      Disposition Plan  :    Status is: Inpatient  Remains inpatient appropriate because:IV treatments appropriate due to intensity of illness or inability to take PO   Dispo: The patient is from: Home              Anticipated d/c is to: Home              Patient currently is not medically stable to d/c.   Difficult to place patient No   DVT Prophylaxis  :   Heparin - SCDs   Lab Results  Component Value Date   PLT 255 04/16/2020    Diet :  Diet Order            Diet regular Room service appropriate? Yes; Fluid consistency: Thin  Diet effective now                  Inpatient Medications  Scheduled Meds: . vitamin C  500 mg Oral Daily  . atorvastatin  20 mg Oral q1800  . fluticasone furoate-vilanterol  1 puff Inhalation Daily  . [START ON 04/17/2020] heparin injection (subcutaneous)  5,000 Units Subcutaneous Q8H  . sodium chloride flush  3 mL Intravenous Q12H  . zinc sulfate  220 mg Oral Daily   Continuous Infusions: . sodium chloride 50 mL/hr at 04/16/20 1114  .  ceFAZolin (ANCEF) IV 1 g (04/16/20 0838)  . metronidazole 500  mg (04/16/20 0400)  . remdesivir 100 mg in NS 100 mL 100 mg (04/16/20 0958)   PRN Meds:.acetaminophen **OR** [DISCONTINUED] acetaminophen, albuterol, guaiFENesin-dextromethorphan  Antibiotics  :    Anti-infectives (From admission, onward)   Start     Dose/Rate Route Frequency Ordered Stop   04/16/20 1000  remdesivir 100 mg in sodium chloride 0.9 % 100 mL IVPB       "Followed by" Linked Group Details   100 mg 200 mL/hr over 30 Minutes Intravenous Daily 04/15/20 1203 04/18/20 0959   04/15/20 1915  metroNIDAZOLE (FLAGYL) IVPB 500 mg        500 mg 100  mL/hr over 60 Minutes Intravenous Every 8 hours 04/15/20 1822     04/15/20 1630  ceFAZolin (ANCEF) IVPB 1 g/50 mL premix        1 g 100 mL/hr over 30 Minutes Intravenous Every 8 hours 04/15/20 1154     04/15/20 1400  remdesivir 200 mg in sodium chloride 0.9% 250 mL IVPB       "Followed by" Linked Group Details   200 mg 580 mL/hr over 30 Minutes Intravenous Once 04/15/20 1203 04/15/20 2101   04/15/20 0745  ceFAZolin (ANCEF) IVPB 1 g/50 mL premix        1 g 100 mL/hr over 30 Minutes Intravenous  Once 04/15/20 0732 04/15/20 0902       Time Spent in minutes  30   Lala Lund M.D on 04/16/2020 at 11:18 AM  To page go to [email or web address]   [facility] -  Office  [phone]    See all Orders from today for further details    Objective:   Vitals:   04/15/20 1921 04/15/20 2329 04/16/20 0351 04/16/20 0720  BP: (!) 121/49 (!) 121/52 122/71 (!) 126/57  Pulse: 94 100 96 95  Resp: (!) 22 (!) 21 20 (!) 21  Temp: 98 F (36.7 C) 98.1 F (36.7 C) 98.2 F (36.8 C) 98.3 F (36.8 C)  TempSrc: Oral Oral Oral Oral  SpO2: 90% 90% 90% 95%  Weight:      Height:        Wt Readings from Last 3 Encounters:  04/15/20 54.9 kg  03/01/17 (!) 558.7 kg  01/15/17 51.9 kg    No intake or output data in the 24 hours ending 04/16/20 1118   Physical Exam  Awake Alert, No new F.N deficits, Normal affect [place].AT,PERRAL Supple Neck,No  JVD, No cervical lymphadenopathy appriciated.  Symmetrical Chest wall movement, Good air movement bilaterally, CTAB RRR,No Gallops,Rubs or new Murmurs, No Parasternal Heave +ve B.Sounds, Abd Soft, No tenderness, No organomegaly appriciated, No rebound - guarding or rigidity. No Cyanosis, Clubbing or edema,   Right breast exam on 04/16/2020 with 2 female CNA's bedside, fungating mass with toilet paper covering it with blood oozing    Data Review:    CBC Recent Labs  Lab 04/14/20 2259 04/16/20 0431  WBC 7.8 8.1  HGB 12.0 10.4*  HCT 39.3 31.8*  PLT 336 255  MCV 92.7 90.9  MCH 28.3 29.7  MCHC 30.5 32.7  RDW 13.5 13.7  LYMPHSABS 1.0  --   MONOABS 0.4  --   EOSABS 0.1  --   BASOSABS 0.0  --     Recent Labs  Lab 04/14/20 2259 04/15/20 0742 04/15/20 0918 04/15/20 1254 04/15/20 1302 04/16/20 0431  NA 139  --   --   --   --  134*  K 3.6  --   --   --   --  3.9  CL 104  --   --   --   --  107  CO2 23  --   --   --   --  20*  GLUCOSE 212*  --   --   --   --  113*  BUN 18  --   --   --   --  13  CREATININE 1.06*  --   --   --   --  0.79  CALCIUM 9.6  --   --   --   --  7.8*  AST 20  --   --   --   --   --  ALT 14  --   --   --   --   --   ALKPHOS 53  --   --   --   --   --   BILITOT 0.7  --   --   --   --   --   ALBUMIN 3.4*  --   --   --   --   --   CRP  --   --   --   --  4.3*  --   DDIMER  --   --   --   --  3.46*  --   PROCALCITON  --   --   --   --  0.10  --   LATICACIDVEN  --  2.2* 1.7  --   --   --   INR  --  1.1  --   --   --   --   TSH  --   --   --  0.779  --   --   BNP  --   --   --  64.0  --   --     ------------------------------------------------------------------------------------------------------------------ No results for input(s): CHOL, HDL, LDLCALC, TRIG, CHOLHDL, LDLDIRECT in the last 72 hours.  Lab Results  Component Value Date   HGBA1C 5.9 (H) 01/02/2017    ------------------------------------------------------------------------------------------------------------------ Recent Labs    04/15/20 1254  TSH 0.779    Cardiac Enzymes No results for input(s): CKMB, TROPONINI, MYOGLOBIN in the last 168 hours.  Invalid input(s): CK ------------------------------------------------------------------------------------------------------------------    Component Value Date/Time   BNP 64.0 04/15/2020 1254    Micro Results Recent Results (from the past 240 hour(s))  Culture, blood (routine x 2)     Status: None (Preliminary result)   Collection Time: 04/15/20  7:42 AM   Specimen: BLOOD  Result Value Ref Range Status   Specimen Description BLOOD LEFT ANTECUBITAL  Final   Special Requests   Final    BOTTLES DRAWN AEROBIC AND ANAEROBIC Blood Culture adequate volume   Culture   Final    NO GROWTH < 24 HOURS Performed at [facility], [address] [place], [place] [address]    Report Status PENDING  Incomplete  Resp Panel by RT-PCR (Flu A&B, Covid) Nasopharyngeal Swab     Status: Abnormal   Collection Time: 04/15/20  7:42 AM   Specimen: Nasopharyngeal Swab; Nasopharyngeal(NP) swabs in vial transport medium  Result Value Ref Range Status   SARS Coronavirus 2 by RT PCR POSITIVE (A) NEGATIVE Final    Comment: RESULT CALLED TO, READ BACK BY AND VERIFIED WITH: RN S.BETRAND ON 04/15/2020 AT 0926 BY E.PARRISH (NOTE) SARS-CoV-2 target nucleic acids are DETECTED.  The SARS-CoV-2 RNA is generally detectable in upper respiratory specimens during the acute phase of infection. Positive results are indicative of the presence of the identified virus, but do not rule out bacterial infection or co-infection with other pathogens not detected by the test. Clinical correlation with patient history and other diagnostic information is necessary to determine patient infection status. The expected result is Negative.  Fact Sheet for  Patients: EntrepreneurPulse.com.au  Fact Sheet for Healthcare Providers: IncredibleEmployment.be  This test is not yet approved or cleared by the [place] FDA and  has been authorized for detection and/or diagnosis of SARS-CoV-2 by FDA under an Emergency Use Authorization (EUA).  This EUA will remain in effect (meaning this  test can be used) for the duration of  the COVID-19 declaration under Section  564(b)(1) of the Act, 21 U.S.C. section 360bbb-3(b)(1), unless the authorization is terminated or revoked sooner.     Influenza A by PCR NEGATIVE NEGATIVE Final   Influenza B by PCR NEGATIVE NEGATIVE Final    Comment: (NOTE) The Xpert Xpress SARS-CoV-2/FLU/RSV plus assay is intended as an aid in the diagnosis of influenza from Nasopharyngeal swab specimens and should not be used as a sole basis for treatment. Nasal washings and aspirates are unacceptable for Xpert Xpress SARS-CoV-2/FLU/RSV testing.  Fact Sheet for Patients: EntrepreneurPulse.com.au  Fact Sheet for Healthcare Providers: IncredibleEmployment.be  This test is not yet approved or cleared by the [place] FDA and has been authorized for detection and/or diagnosis of SARS-CoV-2 by FDA under an Emergency Use Authorization (EUA). This EUA will remain in effect (meaning this test can be used) for the duration of the COVID-19 declaration under Section 564(b)(1) of the Act, 21 U.S.C. section 360bbb-3(b)(1), unless the authorization is terminated or revoked.  Performed at [facility], [place] [address], [place], [place] [address]   Culture, blood (routine x 2)     Status: None (Preliminary result)   Collection Time: 04/15/20  7:43 AM   Specimen: BLOOD RIGHT HAND  Result Value Ref Range Status   Specimen Description BLOOD RIGHT HAND  Final   Special Requests   Final    BOTTLES DRAWN AEROBIC ONLY Blood Culture results may not be optimal  due to an inadequate volume of blood received in culture bottles   Culture   Final    NO GROWTH < 24 HOURS Performed at [facility], [place] [address] [place], [place] [address]    Report Status PENDING  Incomplete    Radiology Reports CT Head Wo Contrast  Result Date: 04/15/2020 CLINICAL DATA:  Syncope with normal neuro exam EXAM: CT HEAD WITHOUT CONTRAST TECHNIQUE: Contiguous axial images were obtained from the base of the skull through the vertex without intravenous contrast. COMPARISON:  Brain MRI 01/02/2017 FINDINGS: Brain: No evidence of acute infarction, hemorrhage, hydrocephalus, extra-axial collection or mass effect. 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 Stable calcification in the right posterior frontal white matter age congruent cerebral volume loss. Known meningioma at the right vertex again measuring 9 mm. Vascular: No hyperdense vessel or unexpected calcification. Skull: Normal. Negative for fracture or focal lesion. Sinuses/Orbits: No acute finding. IMPRESSION: No acute finding or change from 2018 Electronically Signed   By: Monte Fantasia M.D.   On: 04/15/2020 08:42   CT Angio Chest PE W and/or Wo Contrast  Result Date: 04/15/2020 CLINICAL DATA:  Abdominal pain, nonlocalized, weakness, fatigue, chronic RIGHT breast infection for years drainage EXAM: CT ANGIOGRAPHY CHEST CT ABDOMEN AND PELVIS WITH CONTRAST TECHNIQUE: Multidetector CT imaging of the chest was performed using the standard protocol during bolus administration of intravenous contrast. Multiplanar CT image reconstructions and MIPs were obtained to evaluate the vascular anatomy. Multidetector CT imaging of the abdomen and pelvis was performed using the standard protocol during bolus administration of intravenous contrast. CONTRAST:  158mL OMNIPAQUE IOHEXOL 350 MG/ML SOLN IV. No oral contrast. COMPARISON:  None FINDINGS: CTA CHEST FINDINGS Cardiovascular: Atherosclerotic  calcifications aorta, coronary arteries, and proximal great vessels. Aorta normal caliber without aneurysm or dissection. Heart unremarkable. No pericardial effusion. Pulmonary arteries adequately opacified and patent. No evidence of pulmonary embolism. Mediastinum/Nodes: Base of cervical region normal appearance. Esophagus unremarkable. Large lobulated soft tissue mass identified at RIGHT breast extending through skin surface, 8.1 x 4.9 x 7.1  cm in size most consistent with malignancy rather than infection. Mass extends to the chest wall with loss of fat plane between the mass and the underlying pectoralis muscle. Additional small nodule within RIGHT breast 14 x 7 mm question second mass versus lymph node. RIGHT axillary adenopathy with nodes measuring up to 18 mm short axis. Normal sized mediastinal lymph nodes. Lungs/Pleura: Emphysematous changes. Dependent atelectasis. Lungs otherwise clear. No pulmonary infiltrate, pleural effusion, or pneumothorax. No pulmonary mass/nodule. Musculoskeletal: No acute osseous findings. Review of the MIP images confirms the above findings. CT ABDOMEN and PELVIS FINDINGS Hepatobiliary: Gallbladder and liver normal appearance Pancreas: Atrophic without focal mass Spleen: Normal appearance Adrenals/Urinary Tract: Cortical scarring at inferior pole LEFT kidney. Adrenal glands, kidneys, ureters, and bladder otherwise normal appearance Stomach/Bowel: Appendix not visualized, no pericecal inflammatory process seen. Stomach decompressed. Bowel wall thickening of descending colon with mild surrounding infiltrative changes over a long length favoring descending colitis. Minimal thickening of LEFT lateral conal fascia. Sigmoid diverticulosis without evidence of diverticulitis. Remaining bowel loops unremarkable. Vascular/Lymphatic: Atherosclerotic calcifications aorta, iliac arteries, visceral arteries. Infrarenal abdominal aortic aneurysm 4.2 x 4.0 cm in greatest axial dimensions extending  6.6 cm length, terminating above bifurcation. Moderate thrombus. No perianeurysmal infiltration/hemorrhage. No adenopathy. Reproductive: Atrophic uterus with unremarkable adnexa Other: No free air or free fluid.  No hernia. Musculoskeletal: Osseous demineralization. Review of the MIP images confirms the above findings. IMPRESSION: No evidence of pulmonary embolism. Large RIGHT breast mass 8.1 cm greatest size most consistent with malignancy with associated RIGHT axillary adenopathy. Additional small RIGHT breast nodule 14 x 7 mm question mass versus intramammary node. COPD. Descending colitis. Sigmoid diverticulosis without evidence of diverticulitis. Infrarenal abdominal aortic aneurysm 4.2 cm in greatest axial dimensions; Recommend follow-up every 12 months and vascular consultation. This recommendation follows ACR consensus guidelines: White Paper of the ACR Incidental Findings Committee II on Vascular Findings. J Am Coll Radiol 2013; 10:789-794. Emphysema (ICD10-J43.9). Aortic Atherosclerosis (ICD10-I70.0). Aortic aneurysm NOS (ICD10-I71.9).: Findings called to Dr.  Francia Greaves on 04/15/2020 at 0857 hours. Electronically Signed   By: Lavonia Dana M.D.   On: 04/15/2020 08:58   CT ABDOMEN PELVIS W CONTRAST  Result Date: 04/15/2020 CLINICAL DATA:  Abdominal pain, nonlocalized, weakness, fatigue, chronic RIGHT breast infection for years drainage EXAM: CT ANGIOGRAPHY CHEST CT ABDOMEN AND PELVIS WITH CONTRAST TECHNIQUE: Multidetector CT imaging of the chest was performed using the standard protocol during bolus administration of intravenous contrast. Multiplanar CT image reconstructions and MIPs were obtained to evaluate the vascular anatomy. Multidetector CT imaging of the abdomen and pelvis was performed using the standard protocol during bolus administration of intravenous contrast. CONTRAST:  17mL OMNIPAQUE IOHEXOL 350 MG/ML SOLN IV. No oral contrast. COMPARISON:  None FINDINGS: CTA CHEST FINDINGS Cardiovascular:  Atherosclerotic calcifications aorta, coronary arteries, and proximal great vessels. Aorta normal caliber without aneurysm or dissection. Heart unremarkable. No pericardial effusion. Pulmonary arteries adequately opacified and patent. No evidence of pulmonary embolism. Mediastinum/Nodes: Base of cervical region normal appearance. Esophagus unremarkable. Large lobulated soft tissue mass identified at RIGHT breast extending through skin surface, 8.1 x 4.9 x 7.1 cm in size most consistent with malignancy rather than infection. Mass extends to the chest wall with loss of fat plane between the mass and the underlying pectoralis muscle. Additional small nodule within RIGHT breast 14 x 7 mm question second mass versus lymph node. RIGHT axillary adenopathy with nodes measuring up to 18 mm short axis. Normal sized mediastinal lymph nodes. Lungs/Pleura: Emphysematous changes. Dependent atelectasis. Lungs otherwise clear.  No pulmonary infiltrate, pleural effusion, or pneumothorax. No pulmonary mass/nodule. Musculoskeletal: No acute osseous findings. Review of the MIP images confirms the above findings. CT ABDOMEN and PELVIS FINDINGS Hepatobiliary: Gallbladder and liver normal appearance Pancreas: Atrophic without focal mass Spleen: Normal appearance Adrenals/Urinary Tract: Cortical scarring at inferior pole LEFT kidney. Adrenal glands, kidneys, ureters, and bladder otherwise normal appearance Stomach/Bowel: Appendix not visualized, no pericecal inflammatory process seen. Stomach decompressed. Bowel wall thickening of descending colon with mild surrounding infiltrative changes over a long length favoring descending colitis. Minimal thickening of LEFT lateral conal fascia. Sigmoid diverticulosis without evidence of diverticulitis. Remaining bowel loops unremarkable. Vascular/Lymphatic: Atherosclerotic calcifications aorta, iliac arteries, visceral arteries. Infrarenal abdominal aortic aneurysm 4.2 x 4.0 cm in greatest axial  dimensions extending 6.6 cm length, terminating above bifurcation. Moderate thrombus. No perianeurysmal infiltration/hemorrhage. No adenopathy. Reproductive: Atrophic uterus with unremarkable adnexa Other: No free air or free fluid.  No hernia. Musculoskeletal: Osseous demineralization. Review of the MIP images confirms the above findings. IMPRESSION: No evidence of pulmonary embolism. Large RIGHT breast mass 8.1 cm greatest size most consistent with malignancy with associated RIGHT axillary adenopathy. Additional small RIGHT breast nodule 14 x 7 mm question mass versus intramammary node. COPD. Descending colitis. Sigmoid diverticulosis without evidence of diverticulitis. Infrarenal abdominal aortic aneurysm 4.2 cm in greatest axial dimensions; Recommend follow-up every 12 months and vascular consultation. This recommendation follows ACR consensus guidelines: White Paper of the ACR Incidental Findings Committee II on Vascular Findings. J Am Coll Radiol 2013; 10:789-794. Emphysema (ICD10-J43.9). Aortic Atherosclerosis (ICD10-I70.0). Aortic aneurysm NOS (ICD10-I71.9).: Findings called to Dr.  Francia Greaves on 04/15/2020 at 0857 hours. Electronically Signed   By: Lavonia Dana M.D.   On: 04/15/2020 08:58   DG Chest Port 1 View  Result Date: 04/15/2020 CLINICAL DATA:  Recent syncopal episode EXAM: PORTABLE CHEST 1 VIEW COMPARISON:  None. FINDINGS: Cardiac shadows within normal limits. Aortic calcifications are noted. Lungs are hyperinflated bilaterally. Rounded density is noted in the right base measuring proximally 6 cm. This may represent fluid within the major fissure although the possibility of underlying mass deserves consideration. CT of the chest with contrast is recommended for further evaluation. IMPRESSION: Rounded density in the right lung base suspicious for underlying mass. CT is recommended for further evaluation. Electronically Signed   By: Inez Catalina M.D.   On: 04/15/2020 07:36

## 2020-04-17 DIAGNOSIS — R55 Syncope and collapse: Secondary | ICD-10-CM | POA: Diagnosis not present

## 2020-04-17 LAB — COMPREHENSIVE METABOLIC PANEL
ALT: 9 U/L (ref 0–44)
AST: 17 U/L (ref 15–41)
Albumin: 2.4 g/dL — ABNORMAL LOW (ref 3.5–5.0)
Alkaline Phosphatase: 39 U/L (ref 38–126)
Anion gap: 8 (ref 5–15)
BUN: 11 mg/dL (ref 8–23)
CO2: 25 mmol/L (ref 22–32)
Calcium: 8.1 mg/dL — ABNORMAL LOW (ref 8.9–10.3)
Chloride: 105 mmol/L (ref 98–111)
Creatinine, Ser: 0.67 mg/dL (ref 0.44–1.00)
GFR, Estimated: >60 mL/min (ref >60)
Glucose, Bld: 102 mg/dL — ABNORMAL HIGH (ref 70–99)
Potassium: 3.9 mmol/L (ref 3.5–5.1)
Sodium: 138 mmol/L (ref 135–145)
Total Bilirubin: 0.7 mg/dL (ref 0.3–1.2)
Total Protein: 4.9 g/dL — ABNORMAL LOW (ref 6.5–8.1)

## 2020-04-17 LAB — D-DIMER, QUANTITATIVE: D-Dimer, Quant: 2.55 ug/mL-FEU — ABNORMAL HIGH (ref 0.00–0.50)

## 2020-04-17 LAB — CBC WITH DIFFERENTIAL/PLATELET
Abs Immature Granulocytes: 0.03 10*3/uL (ref 0.00–0.07)
Basophils Absolute: 0 10*3/uL (ref 0.0–0.1)
Basophils Relative: 1 %
Eosinophils Absolute: 0.3 10*3/uL (ref 0.0–0.5)
Eosinophils Relative: 4 %
HCT: 29.9 % — ABNORMAL LOW (ref 36.0–46.0)
Hemoglobin: 9.9 g/dL — ABNORMAL LOW (ref 12.0–15.0)
Immature Granulocytes: 1 %
Lymphocytes Relative: 16 %
Lymphs Abs: 1 10*3/uL (ref 0.7–4.0)
MCH: 29.4 pg (ref 26.0–34.0)
MCHC: 33.1 g/dL (ref 30.0–36.0)
MCV: 88.7 fL (ref 80.0–100.0)
Monocytes Absolute: 0.5 10*3/uL (ref 0.1–1.0)
Monocytes Relative: 9 %
Neutro Abs: 4.2 10*3/uL (ref 1.7–7.7)
Neutrophils Relative %: 69 %
Platelets: 279 10*3/uL (ref 150–400)
RBC: 3.37 MIL/uL — ABNORMAL LOW (ref 3.87–5.11)
RDW: 13.6 % (ref 11.5–15.5)
WBC: 6 10*3/uL (ref 4.0–10.5)
nRBC: 0 % (ref 0.0–0.2)

## 2020-04-17 LAB — C-REACTIVE PROTEIN: CRP: 10.5 mg/dL — ABNORMAL HIGH (ref <1.0)

## 2020-04-17 LAB — BRAIN NATRIURETIC PEPTIDE: B Natriuretic Peptide: 67.9 pg/mL (ref 0.0–100.0)

## 2020-04-17 LAB — MAGNESIUM: Magnesium: 1.7 mg/dL (ref 1.7–2.4)

## 2020-04-17 NOTE — Evaluation (Signed)
Physical Therapy Evaluation Patient Details Name: Elizabeth Chase MRN: [id number] DOB: January 21, 1941 Today's Date: 04/17/2020   History of Present Illness  Elizabeth Chase is a 80 y.o. female with medical history significant of hypertension and hyperlipidemia who presented episode of syncope where she stood up to walk and almost passed out, was caught by her daughter on her way down, lost consciousness for a few minutes with no bowel or bladder incontinence, in the ER she was found to be dehydrated and hypotensive, on exam she was found to have a fungating right breast mass, imaging suggested colitis along with right breast malignancy and she was admitted to the hospital; covid +  Clinical Impression   Pt admitted with above diagnosis. Recently had to move out of the hotel where she and her daughter were staying, and have been living out of her car; Independent prior to this admission; Presents to PT with generalized weakness;  Pt currently with functional limitations due to the deficits listed below (see PT Problem List). Pt will benefit from skilled PT to increase their independence and safety with mobility to allow discharge to the venue listed below.  I anticipate she will meet PT goals soon; she very much wants to talk with a Education officer, museum re: options for housing     Follow Up Recommendations No PT follow up;Supervision - Intermittent;Other (comment) (Will need support in the pursuit of her cancer care)    Equipment Recommendations  None recommended by PT    Recommendations for Other Services       Precautions / Restrictions Precautions Precautions: Other (comment) Precaution Comments: Covd      Mobility  Bed Mobility Overal bed mobility: Needs Assistance Bed Mobility: Sit to Supine       Sit to supine: Min assist   General bed mobility comments: Min assist to help LEs into bed    Transfers Overall transfer level: Needs assistance   Transfers: Sit to/from Stand Sit to Stand:  Supervision         General transfer comment: Cues for safety  Ambulation/Gait Ambulation/Gait assistance: Supervision Gait Distance (Feet): 20 Feet (in room amb) Assistive device: None;IV Pole Gait Pattern/deviations: Step-through pattern     General Gait Details: Cues to self-monitor for activity tolerance  Stairs            Wheelchair Mobility    Modified Rankin (Stroke Patients Only)       Balance Overall balance assessment: Mild deficits observed, not formally tested                                           Pertinent Vitals/Pain Pain Assessment: Faces Faces Pain Scale: No hurt Pain Intervention(s): Monitored during session    Home Living Family/patient expects to be discharged to:: Shelter/Homeless (Recently kicked out of hotel room, and had been living in her car for a few days leading up to this admission)                 Additional Comments: Lives with her daughter    Prior Function Level of Independence: Independent               Hand Dominance   Dominant Hand: Right    Extremity/Trunk Assessment   Upper Extremity Assessment Upper Extremity Assessment: Overall WFL for tasks assessed    Lower Extremity Assessment Lower Extremity Assessment: Generalized weakness  Communication   Communication: No difficulties  Cognition Arousal/Alertness: Awake/alert Behavior During Therapy: WFL for tasks assessed/performed Overall Cognitive Status: Within Functional Limits for tasks assessed                                        General Comments General comments (skin integrity, edema, etc.): Session conducted on room air, O2 sats 97%, HR 87    Exercises     Assessment/Plan    PT Assessment Patient needs continued PT services  PT Problem List Decreased strength;Decreased activity tolerance       PT Treatment Interventions DME instruction;Gait training;Stair training;Functional mobility  training;Therapeutic activities;Therapeutic exercise;Patient/family education    PT Goals (Current goals can be found in the Care Plan section)  Acute Rehab PT Goals Patient Stated Goal: Very concerned about getting housing PT Goal Formulation: With patient Time For Goal Achievement: 05/01/20 Potential to Achieve Goals: Good    Frequency Min 3X/week (Will likely meet goals in 1-2 sessions)   Barriers to discharge Other (comment) Experiencing homelessness    Co-evaluation               AM-PAC PT "6 Clicks" Mobility  Outcome Measure Help needed turning from your back to your side while in a flat bed without using bedrails?: None Help needed moving from lying on your back to sitting on the side of a flat bed without using bedrails?: A Little Help needed moving to and from a bed to a chair (including a wheelchair)?: None Help needed standing up from a chair using your arms (e.g., wheelchair or bedside chair)?: A Little Help needed to walk in hospital room?: A Little Help needed climbing 3-5 steps with a railing? : A Little 6 Click Score: 20    End of Session   Activity Tolerance: Patient tolerated treatment well Patient left: in bed;with call bell/phone within reach Nurse Communication: Mobility status PT Visit Diagnosis: Muscle weakness (generalized) (M62.81)    Time: 1525-1540 PT Time Calculation (min) (ACUTE ONLY): 15 min   Charges:   PT Evaluation $PT Eval Low Complexity: [place], PT  Acute Rehabilitation Services Pager [phone] Office [place] 04/17/2020, 4:15 PM

## 2020-04-17 NOTE — Progress Notes (Signed)
PROGRESS NOTE                                                                                                                                                                                                             Patient Demographics:    Elizabeth Chase, is a 80 y.o. female, DOB - [birth year], MVE:[id number]  Outpatient Primary MD for the patient is Elizabeth Noe, MD    LOS - 2  Admit date - 04/14/2020    Chief Complaint  Patient presents with  . Breast Infection        Brief Narrative (HPI from H&P)  - Elizabeth Chase is a 80 y.o. female with medical history significant of hypertension and hyperlipidemia who presented episode of syncope where she stood up to walk and almost passed out, was caught by her daughter on her way down, lost consciousness for a few minutes with no bowel or bladder incontinence, in the ER she was found to be dehydrated and hypotensive, on exam she was found to have a fungating right breast mass, imaging suggested colitis along with right breast malignancy and she was admitted to the hospital.   Subjective:   Patient in bed, appears comfortable, denies any headache, no fever, no chest pain or pressure, no shortness of breath , no abdominal pain. No focal weakness.   Assessment  & Plan :     1.  Syncope due to hypotension.  Caused by dehydration and possible mild colitis.  Currently no diarrhea, has been hydrated with IV fluids and is feeling better already, continue hydration, PT OT and monitor.  Head CT is nonacute and exam is nonfocal  2.  Possible colitis.  Currently no diarrhea.  Agree with IV Flagyl and cephalosporin continue.  3.  Fungating right breast mass with right axillary lymphadenopathy.  Highly suspicious for malignancy.  Seen by general surgery recommend IR to obtain tissue sample which will be done, I have also involved oncology Case discussed with Dr. Alen Chase who will see the patient  shortly, for now bleeding around the mass has been stable after cauterization by general surgery.  Wound care to continue taking care per wound care protocol.  4.  Left wrist swelling appears to be a hematoma at the site of IV.  IV removed, stable X-Ray.  Much improved.  5. Fully vaccinated  with incidental Covid infection.  Agree with 3 days of remdesivir.  6.  Elevated D-dimer.  Check leg venous ultrasound, holding Lovenox as right breast masses bleeding, transition to heparin and SCDs and monitor closely.  CTA lungs is negative.    Recent Labs  Lab 04/14/20 2259 04/16/20 0431 04/17/20 0423  WBC 7.8 8.1 6.0  HGB 12.0 10.4* 9.9*  HCT 39.3 31.8* 29.9*  PLT 336 255 279  MCV 92.7 90.9 88.7  MCH 28.3 29.7 29.4  MCHC 30.5 32.7 33.1  RDW 13.5 13.7 13.6  LYMPHSABS 1.0  --  1.0  MONOABS 0.4  --  0.5  EOSABS 0.1  --  0.3  BASOSABS 0.0  --  0.0    Recent Labs  Lab 04/14/20 2259 04/15/20 0742 04/15/20 0918 04/15/20 1254 04/15/20 1302 04/16/20 0431 04/17/20 0423  NA 139  --   --   --   --  134* 138  K 3.6  --   --   --   --  3.9 3.9  CL 104  --   --   --   --  107 105  CO2 23  --   --   --   --  20* 25  GLUCOSE 212*  --   --   --   --  113* 102*  BUN 18  --   --   --   --  13 11  CREATININE 1.06*  --   --   --   --  0.79 0.67  CALCIUM 9.6  --   --   --   --  7.8* 8.1*  AST 20  --   --   --   --   --  17  ALT 14  --   --   --   --   --  9  ALKPHOS 53  --   --   --   --   --  39  BILITOT 0.7  --   --   --   --   --  0.7  ALBUMIN 3.4*  --   --   --   --   --  2.4*  MG  --   --   --   --   --   --  1.7  CRP  --   --   --   --  4.3*  --  10.5*  DDIMER  --   --   --   --  3.46*  --  2.55*  PROCALCITON  --   --   --   --  0.10  --   --   LATICACIDVEN  --  2.2* 1.7  --   --   --   --   INR  --  1.1  --   --   --   --   --   TSH  --   --   --  0.779  --   --   --   BNP  --   --   --  64.0  --   --  67.9    Recent Labs  Lab 04/14/20 2259 04/15/20 0742 04/15/20 0918  04/15/20 1254 04/15/20 1302 04/16/20 0431 04/17/20 0423  WBC 7.8  --   --   --   --  8.1 6.0  HGB 12.0  --   --   --   --  10.4* 9.9*  HCT 39.3  --   --   --   --  31.8* 29.9*  PLT 336  --   --   --   --  255 279  CRP  --   --   --   --  4.3*  --  10.5*  BNP  --   --   --  64.0  --   --  67.9  DDIMER  --   --   --   --  3.46*  --  2.55*  PROCALCITON  --   --   --   --  0.10  --   --   AST 20  --   --   --   --   --  17  ALT 14  --   --   --   --   --  9  ALKPHOS 53  --   --   --   --   --  39  BILITOT 0.7  --   --   --   --   --  0.7  ALBUMIN 3.4*  --   --   --   --   --  2.4*  INR  --  1.1  --   --   --   --   --   LATICACIDVEN  --  2.2* 1.7  --   --   --   --   SARSCOV2NAA  --  POSITIVE*  --   --   --   --   --             Condition - Extremely Guarded  Family Communication  :  Daughter bedside  Code Status :  Full  Consults  :  CCS, IR  PUD Prophylaxis :    Procedures  :     CT head - Non acute  CT Chest - Abd - Pelvis - No evidence of pulmonary embolism. Large RIGHT breast mass 8.1 cm greatest size most consistent with malignancy with associated RIGHT axillary adenopathy. Additional small RIGHT breast nodule 14 x 7 mm question mass versus intramammary node. COPD. Descending colitis. Sigmoid diverticulosis without evidence of diverticulitis. Infrarenal abdominal aortic aneurysm 4.2 cm in greatest axial dimensions      Disposition Plan  :    Status is: Inpatient  Remains inpatient appropriate because:IV treatments appropriate due to intensity of illness or inability to take PO   Dispo: The patient is from: Home              Anticipated d/c is to: Home              Patient currently is not medically stable to d/c.   Difficult to place patient No  DVT Prophylaxis  :   Heparin - SCDs   Lab Results  Component Value Date   PLT 279 04/17/2020    Diet :  Diet Order            Diet regular Room service appropriate? Yes; Fluid consistency: Thin  Diet  effective now                  Inpatient Medications  Scheduled Meds: . vitamin C  500 mg Oral Daily  . atorvastatin  20 mg Oral q1800  . fluticasone furoate-vilanterol  1 puff Inhalation Daily  . heparin injection (subcutaneous)  5,000 Units Subcutaneous Q8H  . sodium chloride flush  3 mL Intravenous Q12H  . zinc sulfate  220 mg Oral Daily   Continuous Infusions: . sodium chloride Stopped (04/16/20 1931)  .  ceFAZolin (ANCEF) IV 1 g (04/17/20 0906)  . metronidazole 500 mg (04/17/20 0332)  . remdesivir 100 mg in NS 100 mL Stopped (04/16/20 1033)   PRN Meds:.acetaminophen **OR** [DISCONTINUED] acetaminophen, albuterol, guaiFENesin-dextromethorphan, silver nitrate applicators  Antibiotics  :    Anti-infectives (From admission, onward)   Start     Dose/Rate Route Frequency Ordered Stop   04/16/20 1000  remdesivir 100 mg in sodium chloride 0.9 % 100 mL IVPB       "Followed by" Linked Group Details   100 mg 200 mL/hr over 30 Minutes Intravenous Daily 04/15/20 1203 04/18/20 0959   04/15/20 1915  metroNIDAZOLE (FLAGYL) IVPB 500 mg        500 mg 100 mL/hr over 60 Minutes Intravenous Every 8 hours 04/15/20 1822     04/15/20 1630  ceFAZolin (ANCEF) IVPB 1 g/50 mL premix        1 g 100 mL/hr over 30 Minutes Intravenous Every 8 hours 04/15/20 1154     04/15/20 1400  remdesivir 200 mg in sodium chloride 0.9% 250 mL IVPB       "Followed by" Linked Group Details   200 mg 580 mL/hr over 30 Minutes Intravenous Once 04/15/20 1203 04/15/20 2101   04/15/20 0745  ceFAZolin (ANCEF) IVPB 1 g/50 mL premix        1 g 100 mL/hr over 30 Minutes Intravenous  Once 04/15/20 0732 04/15/20 0902       Time Spent in minutes  30   Lala Lund M.D on 04/17/2020 at 9:16 AM  To page go to [email or web address]   [facility] -  Office  [phone]    See all Orders from today for further details    Objective:   Vitals:   04/16/20 1658 04/16/20 2127 04/17/20 0700 04/17/20 0718  BP: (!)  127/59 124/62 123/70   Pulse: 87 77 84 84  Resp: 18 20 18    Temp: 97.9 F (36.6 C) 97.9 F (36.6 C) 98.2 F (36.8 C)   TempSrc: Oral Oral Oral   SpO2: 92% 94% 90%   Weight:      Height:        Wt Readings from Last 3 Encounters:  04/15/20 54.9 kg  03/01/17 (!) 558.7 kg  01/15/17 51.9 kg     Intake/Output Summary (Last 24 hours) at 04/17/2020 0916 Last data filed at 04/17/2020 0131 Gross per 24 hour  Intake 907.41 ml  Output --  Net 907.41 ml     Physical Exam  Awake Alert, No new F.N deficits, Normal affect [place].AT,PERRAL Supple Neck,No JVD, No cervical lymphadenopathy appriciated.  Symmetrical Chest wall movement, Good air movement bilaterally, CTAB RRR,No Gallops, Rubs or new Murmurs, No Parasternal Heave +ve B.Sounds, Abd Soft, No tenderness, No organomegaly appriciated, No rebound - guarding or rigidity. No Cyanosis, left wrist hematoma much improved and almost resolved Right breast exam on 04/16/2020 with 2 female CNA's bedside, fungating mass with toilet paper covering it with blood oozing    Data Review:    CBC Recent Labs  Lab 04/14/20 2259 04/16/20 0431 04/17/20 0423  WBC 7.8 8.1 6.0  HGB 12.0 10.4* 9.9*  HCT 39.3 31.8* 29.9*  PLT 336 255 279  MCV 92.7 90.9 88.7  MCH 28.3 29.7 29.4  MCHC 30.5 32.7 33.1  RDW 13.5 13.7 13.6  LYMPHSABS 1.0  --  1.0  MONOABS 0.4  --  0.5  EOSABS 0.1  --  0.3  BASOSABS 0.0  --  0.0    Recent  Labs  Lab 04/14/20 2259 04/15/20 0742 04/15/20 0918 04/15/20 1254 04/15/20 1302 04/16/20 0431 04/17/20 0423  NA 139  --   --   --   --  134* 138  K 3.6  --   --   --   --  3.9 3.9  CL 104  --   --   --   --  107 105  CO2 23  --   --   --   --  20* 25  GLUCOSE 212*  --   --   --   --  113* 102*  BUN 18  --   --   --   --  13 11  CREATININE 1.06*  --   --   --   --  0.79 0.67  CALCIUM 9.6  --   --   --   --  7.8* 8.1*  AST 20  --   --   --   --   --  17  ALT 14  --   --   --   --   --  9  ALKPHOS 53  --   --   --   --    --  39  BILITOT 0.7  --   --   --   --   --  0.7  ALBUMIN 3.4*  --   --   --   --   --  2.4*  MG  --   --   --   --   --   --  1.7  CRP  --   --   --   --  4.3*  --  10.5*  DDIMER  --   --   --   --  3.46*  --  2.55*  PROCALCITON  --   --   --   --  0.10  --   --   LATICACIDVEN  --  2.2* 1.7  --   --   --   --   INR  --  1.1  --   --   --   --   --   TSH  --   --   --  0.779  --   --   --   BNP  --   --   --  64.0  --   --  67.9    ------------------------------------------------------------------------------------------------------------------ No results for input(s): CHOL, HDL, LDLCALC, TRIG, CHOLHDL, LDLDIRECT in the last 72 hours.  Lab Results  Component Value Date   HGBA1C 5.9 (H) 01/02/2017   ------------------------------------------------------------------------------------------------------------------ Recent Labs    04/15/20 1254  TSH 0.779    Cardiac Enzymes No results for input(s): CKMB, TROPONINI, MYOGLOBIN in the last 168 hours.  Invalid input(s): CK ------------------------------------------------------------------------------------------------------------------    Component Value Date/Time   BNP 67.9 04/17/2020 0423    Micro Results Recent Results (from the past 240 hour(s))  Culture, blood (routine x 2)     Status: None (Preliminary result)   Collection Time: 04/15/20  7:42 AM   Specimen: BLOOD  Result Value Ref Range Status   Specimen Description BLOOD LEFT ANTECUBITAL  Final   Special Requests   Final    BOTTLES DRAWN AEROBIC AND ANAEROBIC Blood Culture adequate volume   Culture   Final    NO GROWTH < 24 HOURS Performed at [facility], [address] [place], [place] [address]    Report Status PENDING  Incomplete  Resp Panel by RT-PCR (  Flu A&B, Covid) Nasopharyngeal Swab     Status: Abnormal   Collection Time: 04/15/20  7:42 AM   Specimen: Nasopharyngeal Swab; Nasopharyngeal(NP) swabs in vial transport medium  Result Value Ref Range  Status   SARS Coronavirus 2 by RT PCR POSITIVE (A) NEGATIVE Final    Comment: RESULT CALLED TO, READ BACK BY AND VERIFIED WITH: RN S.BETRAND ON 04/15/2020 AT 0926 BY E.PARRISH (NOTE) SARS-CoV-2 target nucleic acids are DETECTED.  The SARS-CoV-2 RNA is generally detectable in upper respiratory specimens during the acute phase of infection. Positive results are indicative of the presence of the identified virus, but do not rule out bacterial infection or co-infection with other pathogens not detected by the test. Clinical correlation with patient history and other diagnostic information is necessary to determine patient infection status. The expected result is Negative.  Fact Sheet for Patients: EntrepreneurPulse.com.au  Fact Sheet for Healthcare Providers: IncredibleEmployment.be  This test is not yet approved or cleared by the [place] FDA and  has been authorized for detection and/or diagnosis of SARS-CoV-2 by FDA under an Emergency Use Authorization (EUA).  This EUA will remain in effect (meaning this  test can be used) for the duration of  the COVID-19 declaration under Section 564(b)(1) of the Act, 21 U.S.C. section 360bbb-3(b)(1), unless the authorization is terminated or revoked sooner.     Influenza A by PCR NEGATIVE NEGATIVE Final   Influenza B by PCR NEGATIVE NEGATIVE Final    Comment: (NOTE) The Xpert Xpress SARS-CoV-2/FLU/RSV plus assay is intended as an aid in the diagnosis of influenza from Nasopharyngeal swab specimens and should not be used as a sole basis for treatment. Nasal washings and aspirates are unacceptable for Xpert Xpress SARS-CoV-2/FLU/RSV testing.  Fact Sheet for Patients: EntrepreneurPulse.com.au  Fact Sheet for Healthcare Providers: IncredibleEmployment.be  This test is not yet approved or cleared by the [place] FDA and has been authorized for detection and/or  diagnosis of SARS-CoV-2 by FDA under an Emergency Use Authorization (EUA). This EUA will remain in effect (meaning this test can be used) for the duration of the COVID-19 declaration under Section 564(b)(1) of the Act, 21 U.S.C. section 360bbb-3(b)(1), unless the authorization is terminated or revoked.  Performed at [facility], [place] [address] [place], [place] [address]   Culture, blood (routine x 2)     Status: None (Preliminary result)   Collection Time: 04/15/20  7:43 AM   Specimen: BLOOD RIGHT HAND  Result Value Ref Range Status   Specimen Description BLOOD RIGHT HAND  Final   Special Requests   Final    BOTTLES DRAWN AEROBIC ONLY Blood Culture results may not be optimal due to an inadequate volume of blood received in culture bottles   Culture   Final    NO GROWTH < 24 HOURS Performed at [facility], [place] [address] [place], [place] [address]    Report Status PENDING  Incomplete    Radiology Reports DG Wrist Complete Left  Result Date: 04/16/2020 CLINICAL DATA:  Left wrist swelling. EXAM: LEFT WRIST - COMPLETE 3+ VIEW COMPARISON:  None. FINDINGS: Degenerative changes between the base of the first metacarpal and trapezium. Degenerative changes between the trapezium and scaphoid. Soft tissue swelling best seen on the lateral view. No fractures or bony erosions identified. IMPRESSION: Soft tissue swelling.  Degenerative changes as above. Electronically Signed   By: Dorise Bullion III M.D   On: 04/16/2020 12:33   CT Head Wo Contrast  Result Date: 04/15/2020  CLINICAL DATA:  Syncope with normal neuro exam EXAM: CT HEAD WITHOUT CONTRAST TECHNIQUE: Contiguous axial images were obtained from the base of the skull through the vertex without intravenous contrast. COMPARISON:  Brain MRI 01/02/2017 FINDINGS: Brain: No evidence of acute infarction, hemorrhage, hydrocephalus, extra-axial collection or mass effect. Chronic small vessel infarcts in the bilateral thalamus and  bilateral cerebellum. Stable calcification in the right posterior frontal white matter age congruent cerebral volume loss. Known meningioma at the right vertex again measuring 9 mm. Vascular: No hyperdense vessel or unexpected calcification. Skull: Normal. Negative for fracture or focal lesion. Sinuses/Orbits: No acute finding. IMPRESSION: No acute finding or change from 2018 Electronically Signed   By: Monte Fantasia M.D.   On: 04/15/2020 08:42   CT Angio Chest PE W and/or Wo Contrast  Result Date: 04/15/2020 CLINICAL DATA:  Abdominal pain, nonlocalized, weakness, fatigue, chronic RIGHT breast infection for years drainage EXAM: CT ANGIOGRAPHY CHEST CT ABDOMEN AND PELVIS WITH CONTRAST TECHNIQUE: Multidetector CT imaging of the chest was performed using the standard protocol during bolus administration of intravenous contrast. Multiplanar CT image reconstructions and MIPs were obtained to evaluate the vascular anatomy. Multidetector CT imaging of the abdomen and pelvis was performed using the standard protocol during bolus administration of intravenous contrast. CONTRAST:  143mL OMNIPAQUE IOHEXOL 350 MG/ML SOLN IV. No oral contrast. COMPARISON:  None FINDINGS: CTA CHEST FINDINGS Cardiovascular: Atherosclerotic calcifications aorta, coronary arteries, and proximal great vessels. Aorta normal caliber without aneurysm or dissection. Heart unremarkable. No pericardial effusion. Pulmonary arteries adequately opacified and patent. No evidence of pulmonary embolism. Mediastinum/Nodes: Base of cervical region normal appearance. Esophagus unremarkable. Large lobulated soft tissue mass identified at RIGHT breast extending through skin surface, 8.1 x 4.9 x 7.1 cm in size most consistent with malignancy rather than infection. Mass extends to the chest wall with loss of fat plane between the mass and the underlying pectoralis muscle. Additional small nodule within RIGHT breast 14 x 7 mm question second mass versus lymph node.  RIGHT axillary adenopathy with nodes measuring up to 18 mm short axis. Normal sized mediastinal lymph nodes. Lungs/Pleura: Emphysematous changes. Dependent atelectasis. Lungs otherwise clear. No pulmonary infiltrate, pleural effusion, or pneumothorax. No pulmonary mass/nodule. Musculoskeletal: No acute osseous findings. Review of the MIP images confirms the above findings. CT ABDOMEN and PELVIS FINDINGS Hepatobiliary: Gallbladder and liver normal appearance Pancreas: Atrophic without focal mass Spleen: Normal appearance Adrenals/Urinary Tract: Cortical scarring at inferior pole LEFT kidney. Adrenal glands, kidneys, ureters, and bladder otherwise normal appearance Stomach/Bowel: Appendix not visualized, no pericecal inflammatory process seen. Stomach decompressed. Bowel wall thickening of descending colon with mild surrounding infiltrative changes over a long length favoring descending colitis. Minimal thickening of LEFT lateral conal fascia. Sigmoid diverticulosis without evidence of diverticulitis. Remaining bowel loops unremarkable. Vascular/Lymphatic: Atherosclerotic calcifications aorta, iliac arteries, visceral arteries. Infrarenal abdominal aortic aneurysm 4.2 x 4.0 cm in greatest axial dimensions extending 6.6 cm length, terminating above bifurcation. Moderate thrombus. No perianeurysmal infiltration/hemorrhage. No adenopathy. Reproductive: Atrophic uterus with unremarkable adnexa Other: No free air or free fluid.  No hernia. Musculoskeletal: Osseous demineralization. Review of the MIP images confirms the above findings. IMPRESSION: No evidence of pulmonary embolism. Large RIGHT breast mass 8.1 cm greatest size most consistent with malignancy with associated RIGHT axillary adenopathy. Additional small RIGHT breast nodule 14 x 7 mm question mass versus intramammary node. COPD. Descending colitis. Sigmoid diverticulosis without evidence of diverticulitis. Infrarenal abdominal aortic aneurysm 4.2 cm in greatest  axial dimensions; Recommend follow-up every 12 months and  vascular consultation. This recommendation follows ACR consensus guidelines: White Paper of the ACR Incidental Findings Committee II on Vascular Findings. J Am Coll Radiol 2013; 10:789-794. Emphysema (ICD10-J43.9). Aortic Atherosclerosis (ICD10-I70.0). Aortic aneurysm NOS (ICD10-I71.9).: Findings called to Dr.  Francia Greaves on 04/15/2020 at 0857 hours. Electronically Signed   By: Lavonia Dana M.D.   On: 04/15/2020 08:58   CT ABDOMEN PELVIS W CONTRAST  Result Date: 04/15/2020 CLINICAL DATA:  Abdominal pain, nonlocalized, weakness, fatigue, chronic RIGHT breast infection for years drainage EXAM: CT ANGIOGRAPHY CHEST CT ABDOMEN AND PELVIS WITH CONTRAST TECHNIQUE: Multidetector CT imaging of the chest was performed using the standard protocol during bolus administration of intravenous contrast. Multiplanar CT image reconstructions and MIPs were obtained to evaluate the vascular anatomy. Multidetector CT imaging of the abdomen and pelvis was performed using the standard protocol during bolus administration of intravenous contrast. CONTRAST:  138mL OMNIPAQUE IOHEXOL 350 MG/ML SOLN IV. No oral contrast. COMPARISON:  None FINDINGS: CTA CHEST FINDINGS Cardiovascular: Atherosclerotic calcifications aorta, coronary arteries, and proximal great vessels. Aorta normal caliber without aneurysm or dissection. Heart unremarkable. No pericardial effusion. Pulmonary arteries adequately opacified and patent. No evidence of pulmonary embolism. Mediastinum/Nodes: Base of cervical region normal appearance. Esophagus unremarkable. Large lobulated soft tissue mass identified at RIGHT breast extending through skin surface, 8.1 x 4.9 x 7.1 cm in size most consistent with malignancy rather than infection. Mass extends to the chest wall with loss of fat plane between the mass and the underlying pectoralis muscle. Additional small nodule within RIGHT breast 14 x 7 mm question second mass  versus lymph node. RIGHT axillary adenopathy with nodes measuring up to 18 mm short axis. Normal sized mediastinal lymph nodes. Lungs/Pleura: Emphysematous changes. Dependent atelectasis. Lungs otherwise clear. No pulmonary infiltrate, pleural effusion, or pneumothorax. No pulmonary mass/nodule. Musculoskeletal: No acute osseous findings. Review of the MIP images confirms the above findings. CT ABDOMEN and PELVIS FINDINGS Hepatobiliary: Gallbladder and liver normal appearance Pancreas: Atrophic without focal mass Spleen: Normal appearance Adrenals/Urinary Tract: Cortical scarring at inferior pole LEFT kidney. Adrenal glands, kidneys, ureters, and bladder otherwise normal appearance Stomach/Bowel: Appendix not visualized, no pericecal inflammatory process seen. Stomach decompressed. Bowel wall thickening of descending colon with mild surrounding infiltrative changes over a long length favoring descending colitis. Minimal thickening of LEFT lateral conal fascia. Sigmoid diverticulosis without evidence of diverticulitis. Remaining bowel loops unremarkable. Vascular/Lymphatic: Atherosclerotic calcifications aorta, iliac arteries, visceral arteries. Infrarenal abdominal aortic aneurysm 4.2 x 4.0 cm in greatest axial dimensions extending 6.6 cm length, terminating above bifurcation. Moderate thrombus. No perianeurysmal infiltration/hemorrhage. No adenopathy. Reproductive: Atrophic uterus with unremarkable adnexa Other: No free air or free fluid.  No hernia. Musculoskeletal: Osseous demineralization. Review of the MIP images confirms the above findings. IMPRESSION: No evidence of pulmonary embolism. Large RIGHT breast mass 8.1 cm greatest size most consistent with malignancy with associated RIGHT axillary adenopathy. Additional small RIGHT breast nodule 14 x 7 mm question mass versus intramammary node. COPD. Descending colitis. Sigmoid diverticulosis without evidence of diverticulitis. Infrarenal abdominal aortic aneurysm  4.2 cm in greatest axial dimensions; Recommend follow-up every 12 months and vascular consultation. This recommendation follows ACR consensus guidelines: White Paper of the ACR Incidental Findings Committee II on Vascular Findings. J Am Coll Radiol 2013; 10:789-794. Emphysema (ICD10-J43.9). Aortic Atherosclerosis (ICD10-I70.0). Aortic aneurysm NOS (ICD10-I71.9).: Findings called to Dr.  Francia Greaves on 04/15/2020 at 0857 hours. Electronically Signed   By: Lavonia Dana M.D.   On: 04/15/2020 08:58   DG Chest Port 1 View  Result Date:  04/15/2020 CLINICAL DATA:  Recent syncopal episode EXAM: PORTABLE CHEST 1 VIEW COMPARISON:  None. FINDINGS: Cardiac shadows within normal limits. Aortic calcifications are noted. Lungs are hyperinflated bilaterally. Rounded density is noted in the right base measuring proximally 6 cm. This may represent fluid within the major fissure although the possibility of underlying mass deserves consideration. CT of the chest with contrast is recommended for further evaluation. IMPRESSION: Rounded density in the right lung base suspicious for underlying mass. CT is recommended for further evaluation. Electronically Signed   By: Inez Catalina M.D.   On: 04/15/2020 07:36   VAS [place] LOWER EXTREMITY VENOUS (DVT)  Result Date: 04/16/2020  Lower Venous DVT Study Indications: Rapidly rising D-dimer & Covid+.  Comparison Study: No previous exams Performing Technologist: Rogelia Rohrer  Examination Guidelines: A complete evaluation includes B-mode imaging, spectral Doppler, color Doppler, and power Doppler as needed of all accessible portions of each vessel. Bilateral testing is considered an integral part of a complete examination. Limited examinations for reoccurring indications may be performed as noted. The reflux portion of the exam is performed with the patient in reverse Trendelenburg.  +---------+---------------+---------+-----------+----------+--------------+ RIGHT     CompressibilityPhasicitySpontaneityPropertiesThrombus Aging +---------+---------------+---------+-----------+----------+--------------+ CFV      Full           Yes      Yes                                 +---------+---------------+---------+-----------+----------+--------------+ SFJ      Full                                                        +---------+---------------+---------+-----------+----------+--------------+ FV Prox  Full           Yes      Yes                                 +---------+---------------+---------+-----------+----------+--------------+ FV Mid   Full           Yes      Yes                                 +---------+---------------+---------+-----------+----------+--------------+ FV DistalFull           Yes      Yes                                 +---------+---------------+---------+-----------+----------+--------------+ PFV      Full                                                        +---------+---------------+---------+-----------+----------+--------------+ POP      Full           Yes      Yes                                 +---------+---------------+---------+-----------+----------+--------------+  PTV      Full                                                        +---------+---------------+---------+-----------+----------+--------------+ PERO     Full                                                        +---------+---------------+---------+-----------+----------+--------------+   +---------+---------------+---------+-----------+----------+--------------+ LEFT     CompressibilityPhasicitySpontaneityPropertiesThrombus Aging +---------+---------------+---------+-----------+----------+--------------+ CFV      Full                                                        +---------+---------------+---------+-----------+----------+--------------+ SFJ      Full                                                         +---------+---------------+---------+-----------+----------+--------------+ FV Prox  Full           Yes      Yes                                 +---------+---------------+---------+-----------+----------+--------------+ FV Mid   Full           Yes      Yes                                 +---------+---------------+---------+-----------+----------+--------------+ FV DistalFull           Yes      Yes                                 +---------+---------------+---------+-----------+----------+--------------+ PFV      Full                                                        +---------+---------------+---------+-----------+----------+--------------+ POP      Full           Yes      Yes                                 +---------+---------------+---------+-----------+----------+--------------+ PTV      Full                                                        +---------+---------------+---------+-----------+----------+--------------+  PERO     Full                                                        +---------+---------------+---------+-----------+----------+--------------+     Summary: BILATERAL: - No evidence of deep vein thrombosis seen in the lower extremities, bilaterally. - No evidence of superficial venous thrombosis in the lower extremities, bilaterally. -No evidence of popliteal cyst, bilaterally.   *See table(s) above for measurements and observations. Electronically signed by Jamelle Haring on 04/16/2020 at 5:48:36 PM.    Final

## 2020-04-17 NOTE — Consult Note (Addendum)
[facility] Nurse Consult Note: Reason for Consult: right fungating breast mass.  See by General Surgery PA-C B. Meuth yesterday. [facility] nursing consult is requested for topical care guidance. Wound type: neoplastic Pressure Injury POA: N/A Measurement: Per B. Meuth, 8.1 cm diameter, round raised neoplastic lesion Wound bed:red lobular presentation with yellow striations. See photo provided to EMR by Ms. Meuth. Drainage (amount, consistency, odor) Yellow, malodorous. Bleeding controlled with pressure.  Ms. Ileene Rubens requested silver nitrate sticks to bedside for control of minor bleeding with dressing changes (as well as a breast binder) Periwound: dry, intact Dressing procedure/placement/frequency: I will provide guidance for Nursing to provide topical care using an antimicrobial nonadherent to minimize trauma in dressing removal and to provide a level of odor mitigation using Xeroform gauze.We will begin with twice daily and that may be adjusted up or down as bleeding indicates. This POC may have to change per radiation oncology.  [facility] nursing team will not follow, but will remain available to this patient, the nursing and medical teams.  Please re-consult if needed. Thanks, Maudie Flakes, MSN, RN, [place], Arther Abbott  Pager# [phone]

## 2020-04-17 NOTE — Progress Notes (Signed)
Ms. Hileman case was discussed with Dr. Candiss Norse today and imaging studies were reviewed. This is an unfortunate woman with locally advanced breast malignancy although tissue biopsy has not been obtained. She has a challenging social situation that requires a lot of assistance moving forward.  I recommended obtaining tissue biopsy and will arrange outpatient oncology follow-up upon her discharge. I will connect the patient with the breast cancer navigator to discuss with the patient barriers to follow-up and care of her breast cancer moving forward.  No urgent oncology intervention is needed at this moment.

## 2020-04-18 ENCOUNTER — Encounter (HOSPITAL_COMMUNITY): Payer: Self-pay | Admitting: Internal Medicine

## 2020-04-18 DIAGNOSIS — R55 Syncope and collapse: Secondary | ICD-10-CM | POA: Diagnosis not present

## 2020-04-18 LAB — CBC WITH DIFFERENTIAL/PLATELET
Abs Immature Granulocytes: 0.02 10*3/uL (ref 0.00–0.07)
Basophils Absolute: 0 10*3/uL (ref 0.0–0.1)
Basophils Relative: 1 %
Eosinophils Absolute: 0.4 10*3/uL (ref 0.0–0.5)
Eosinophils Relative: 7 %
HCT: 32.2 % — ABNORMAL LOW (ref 36.0–46.0)
Hemoglobin: 10.3 g/dL — ABNORMAL LOW (ref 12.0–15.0)
Immature Granulocytes: 0 %
Lymphocytes Relative: 17 %
Lymphs Abs: 1 10*3/uL (ref 0.7–4.0)
MCH: 28.6 pg (ref 26.0–34.0)
MCHC: 32 g/dL (ref 30.0–36.0)
MCV: 89.4 fL (ref 80.0–100.0)
Monocytes Absolute: 0.6 10*3/uL (ref 0.1–1.0)
Monocytes Relative: 9 %
Neutro Abs: 4 10*3/uL (ref 1.7–7.7)
Neutrophils Relative %: 66 %
Platelets: 300 10*3/uL (ref 150–400)
RBC: 3.6 MIL/uL — ABNORMAL LOW (ref 3.87–5.11)
RDW: 13.2 % (ref 11.5–15.5)
WBC: 6 10*3/uL (ref 4.0–10.5)
nRBC: 0 % (ref 0.0–0.2)

## 2020-04-18 LAB — COMPREHENSIVE METABOLIC PANEL
ALT: 9 U/L (ref 0–44)
AST: 17 U/L (ref 15–41)
Albumin: 2.4 g/dL — ABNORMAL LOW (ref 3.5–5.0)
Alkaline Phosphatase: 39 U/L (ref 38–126)
Anion gap: 8 (ref 5–15)
BUN: 14 mg/dL (ref 8–23)
CO2: 25 mmol/L (ref 22–32)
Calcium: 8.2 mg/dL — ABNORMAL LOW (ref 8.9–10.3)
Chloride: 104 mmol/L (ref 98–111)
Creatinine, Ser: 0.73 mg/dL (ref 0.44–1.00)
GFR, Estimated: >60 mL/min (ref >60)
Glucose, Bld: 114 mg/dL — ABNORMAL HIGH (ref 70–99)
Potassium: 3.6 mmol/L (ref 3.5–5.1)
Sodium: 137 mmol/L (ref 135–145)
Total Bilirubin: 0.4 mg/dL (ref 0.3–1.2)
Total Protein: 5.1 g/dL — ABNORMAL LOW (ref 6.5–8.1)

## 2020-04-18 LAB — BRAIN NATRIURETIC PEPTIDE: B Natriuretic Peptide: 79.9 pg/mL (ref 0.0–100.0)

## 2020-04-18 LAB — D-DIMER, QUANTITATIVE: D-Dimer, Quant: 2.31 ug/mL-FEU — ABNORMAL HIGH (ref 0.00–0.50)

## 2020-04-18 LAB — C-REACTIVE PROTEIN: CRP: 7.7 mg/dL — ABNORMAL HIGH (ref <1.0)

## 2020-04-18 LAB — MAGNESIUM: Magnesium: 1.7 mg/dL (ref 1.7–2.4)

## 2020-04-18 MED ORDER — LIDOCAINE-EPINEPHRINE 2 %-1:100000 IJ SOLN
30.0000 mL | Freq: Once | INTRAMUSCULAR | Status: DC
  Filled 2020-04-18: qty 30

## 2020-04-18 MED ORDER — METRONIDAZOLE 500 MG PO TABS
500.0000 mg | ORAL_TABLET | Freq: Three times a day (TID) | ORAL | Status: DC
  Administered 2020-04-18 – 2020-04-21 (×10): 500 mg via ORAL
  Filled 2020-04-18 (×13): qty 1

## 2020-04-18 MED ORDER — TRAZODONE HCL 50 MG PO TABS
25.0000 mg | ORAL_TABLET | Freq: Every evening | ORAL | Status: DC | PRN
  Administered 2020-04-18 – 2020-04-25 (×7): 25 mg via ORAL
  Filled 2020-04-18 (×7): qty 1

## 2020-04-18 MED ORDER — LIDOCAINE-EPINEPHRINE 2 %-1:100000 IJ SOLN
20.0000 mL | Freq: Once | INTRAMUSCULAR | Status: DC
  Filled 2020-04-18: qty 20

## 2020-04-18 NOTE — Consult Note (Cosign Needed)
[place]  [phone] [phone]   MEDICAL ONCOLOGY - INITIAL CONSULTATION  Referral MD: Dr. Lala Lund  Reason for Referral: Fungating right breast mass  HPI: Elizabeth Chase is an 80 year old female with a past medical history significant for hypertension, history of CVA, and hyperlipidemia.  She presented to the hospital with complaints of syncope.  She lost consciousness for only a short period of time.  She has been having weakness with upset stomach and nausea recently.  Labs on admission notable for an elevated creatinine of 1.06 and COVID-19 positive.  CT head without contrast performed on 04/15/2020 showed no acute findings.  CTA chest and CT abdomen/pelvis showed no PE, large right breast mass measuring 8.1 cm most consistent with malignancy and associated right axillary adenopathy, additional small right breast nodule measuring 14 x 7 mm question mass versus intramammary node, COPD, descending colitis.  The patient was noted to have a fungating right breast mass on exam.  The patient has been seen by general surgery and underwent additional biopsy of her fungating right breast mass earlier today.  The patient was seen today in her hospital room.  No family at the bedside.  The patient tells me that she has had a right breast mass for at least 2 to 3 years that has progressively gotten bigger.  She has not had a mammogram in quite some time.  She denies having any pain to her right breast but reports having some pruritus in between her breasts.  She reports a poor appetite and has lost about 10 to 15 pounds recently.  She reports having episodes of feeling hot and then cold but has not measured any fevers. She denies headaches and dizziness.  No recurrent syncope.  She denies chest pain, shortness of breath, cough.  Denies abdominal pain, nausea, vomiting.  She denies bleeding.  The patient is widowed.  She is currently living with her daughter in a car for the  past 4 to 5 months.  She reports that she previously drank alcohol about 20 years ago.  She previously smoked about half pack to 1 pack cigarettes per day since her teens.  She quit about 5 years ago when she had a stroke. Family history significant for a sister who died from breast cancer in her 33s and a father and son who both died from lung cancer.  Medical oncology was asked see the patient to make recommendations regarding her fungating right breast mass.    Past Medical History:  Diagnosis Date  . Hypertension   :  History reviewed. No pertinent surgical history.:  Current Facility-Administered Medications  Medication Dose Route Frequency Provider Last Rate Last Admin  . acetaminophen (TYLENOL) tablet 650 mg  650 mg Oral Q6H PRN Smith, Rondell A, MD      . albuterol (VENTOLIN HFA) 108 (90 Base) MCG/ACT inhaler 2 puff  2 puff Inhalation Q6H PRN Smith, Rondell A, MD      . ascorbic acid (VITAMIN C) tablet 500 mg  500 mg Oral Daily Tamala Julian, Rondell A, MD   500 mg at 04/18/20 0846  . atorvastatin (LIPITOR) tablet 20 mg  20 mg Oral q1800 Fuller Plan A, MD   20 mg at 04/17/20 1732  . fluticasone furoate-vilanterol (BREO ELLIPTA) 200-25 MCG/INH 1 puff  1 puff Inhalation Daily Fuller Plan A, MD   1 puff at 04/18/20 0846  . guaiFENesin-dextromethorphan (ROBITUSSIN DM) 100-10 MG/5ML syrup 10 mL  10 mL Oral Q4H PRN Fuller Plan  A, MD      . heparin injection 5,000 Units  5,000 Units Subcutaneous Q8H Thurnell Lose, MD   5,000 Units at 04/17/20 2308  . lidocaine-EPINEPHrine (XYLOCAINE W/EPI) 2 %-1:100000 (with pres) injection 20 mL  20 mL Intradermal Once Hammons, Theone Murdoch, RPH      . metroNIDAZOLE (FLAGYL) tablet 500 mg  500 mg Oral Q8H Pham, Minh Q, RPH-CPP   500 mg at 04/18/20 1525  . silver nitrate applicators applicator 10 Stick  10 Stick Topical PRN Meuth, Brooke A, PA-C      . sodium chloride flush (NS) 0.9 % injection 3 mL  3 mL Intravenous Q12H Smith, Rondell A, MD   3 mL at  04/18/20 0847  . zinc sulfate capsule 220 mg  220 mg Oral Daily Tamala Julian, Rondell A, MD   220 mg at 04/18/20 0846     Allergies  Allergen Reactions  . Bee Venom   :  Family History  Problem Relation Age of Onset  . Diabetes Mother   . Cancer Father   :  Social History   Socioeconomic History  . Marital status: Widowed    Spouse name: Not on file  . Number of children: Not on file  . Years of education: Not on file  . Highest education level: Not on file  Occupational History  . Not on file  Tobacco Use  . Smoking status: Former Smoker    Packs/day: 1.00    Types: Cigarettes    Quit date: 12/31/2016    Years since quitting: 3.2  . Smokeless tobacco: Never Used  Vaping Use  . Vaping Use: Not on file  Substance and Sexual Activity  . Alcohol use: No  . Drug use: No  . Sexual activity: Not on file  Other Topics Concern  . Not on file  Social History Narrative  . Not on file   Social Determinants of Health   Financial Resource Strain: Not on file  Food Insecurity: Not on file  Transportation Needs: Not on file  Physical Activity: Not on file  Stress: Not on file  Social Connections: Not on file  Intimate Partner Violence: Not on file  :  Review of Systems: A comprehensive 14 point review of systems was negative except as noted in the HPI.  Exam: Patient Vitals for the past 24 hrs:  BP Temp Temp src Pulse Resp SpO2  04/18/20 1335 138/63 98.3 F (36.8 C) Oral 81 19 94 %  04/18/20 0502 130/71 98 F (36.7 C) Axillary 78 20 93 %  04/17/20 1916 (!) 170/78 98.3 F (36.8 C) Oral 82 20 93 %  04/17/20 1616 (!) 149/59 98.7 F (37.1 C) Oral 79 19 94 %    General: Chronically ill-appearing female, no distress Eyes:  no scleral icterus.   ENT:  There were no oropharyngeal lesions.     Lymphatics: Palpable right axillary lymph nodes. Respiratory: lungs were clear bilaterally without wheezing or crackles.   Cardiovascular:  Regular rate and rhythm, S1/S2, without  murmur, rub or gallop.  There was no pedal edema.   GI:  abdomen was soft, flat, nontender, nondistended, without organomegaly.   Musculoskeletal:  no spinal tenderness of palpation of vertebral spine.   Skin exam was without echymosis, petichae.   Breasts: Fungating right breast mass not visualized due to pressure dressing applied by general surgery earlier today.  Photo below was taken on 04/14/2020. Neuro exam was nonfocal. Patient was alert and oriented.  Attention was good.  Language was appropriate.  Mood was normal without depression.  Speech was not pressured.  Thought content was not tangential.         Lab Results  Component Value Date   WBC 6.0 04/18/2020   HGB 10.3 (L) 04/18/2020   HCT 32.2 (L) 04/18/2020   PLT 300 04/18/2020   GLUCOSE 114 (H) 04/18/2020   CHOL 159 01/02/2017   TRIG 109 01/02/2017   HDL 45 01/02/2017   LDLCALC 92 01/02/2017   ALT 9 04/18/2020   AST 17 04/18/2020   NA 137 04/18/2020   K 3.6 04/18/2020   CL 104 04/18/2020   CREATININE 0.73 04/18/2020   BUN 14 04/18/2020   CO2 25 04/18/2020    DG Wrist Complete Left  Result Date: 04/16/2020 CLINICAL DATA:  Left wrist swelling. EXAM: LEFT WRIST - COMPLETE 3+ VIEW COMPARISON:  None. FINDINGS: Degenerative changes between the base of the first metacarpal and trapezium. Degenerative changes between the trapezium and scaphoid. Soft tissue swelling best seen on the lateral view. No fractures or bony erosions identified. IMPRESSION: Soft tissue swelling.  Degenerative changes as above. Electronically Signed   By: Dorise Bullion III M.D   On: 04/16/2020 12:33   CT Head Wo Contrast  Result Date: 04/15/2020 CLINICAL DATA:  Syncope with normal neuro exam EXAM: CT HEAD WITHOUT CONTRAST TECHNIQUE: Contiguous axial images were obtained from the base of the skull through the vertex without intravenous contrast. COMPARISON:  Brain MRI 01/02/2017 FINDINGS: Brain: No evidence of acute infarction, hemorrhage,  hydrocephalus, extra-axial collection or mass effect. 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 Stable calcification in the right posterior frontal white matter age congruent cerebral volume loss. Known meningioma at the right vertex again measuring 9 mm. Vascular: No hyperdense vessel or unexpected calcification. Skull: Normal. Negative for fracture or focal lesion. Sinuses/Orbits: No acute finding. IMPRESSION: No acute finding or change from 2018 Electronically Signed   By: Monte Fantasia M.D.   On: 04/15/2020 08:42   CT Angio Chest PE W and/or Wo Contrast  Result Date: 04/15/2020 CLINICAL DATA:  Abdominal pain, nonlocalized, weakness, fatigue, chronic RIGHT breast infection for years drainage EXAM: CT ANGIOGRAPHY CHEST CT ABDOMEN AND PELVIS WITH CONTRAST TECHNIQUE: Multidetector CT imaging of the chest was performed using the standard protocol during bolus administration of intravenous contrast. Multiplanar CT image reconstructions and MIPs were obtained to evaluate the vascular anatomy. Multidetector CT imaging of the abdomen and pelvis was performed using the standard protocol during bolus administration of intravenous contrast. CONTRAST:  154mL OMNIPAQUE IOHEXOL 350 MG/ML SOLN IV. No oral contrast. COMPARISON:  None FINDINGS: CTA CHEST FINDINGS Cardiovascular: Atherosclerotic calcifications aorta, coronary arteries, and proximal great vessels. Aorta normal caliber without aneurysm or dissection. Heart unremarkable. No pericardial effusion. Pulmonary arteries adequately opacified and patent. No evidence of pulmonary embolism. Mediastinum/Nodes: Base of cervical region normal appearance. Esophagus unremarkable. Large lobulated soft tissue mass identified at RIGHT breast extending through skin surface, 8.1 x 4.9 x 7.1 cm in size most consistent with malignancy rather than infection. Mass extends to the chest wall with loss of fat plane between the mass and the  underlying pectoralis muscle. Additional small nodule within RIGHT breast 14 x 7 mm question second mass versus lymph node. RIGHT axillary adenopathy with nodes measuring up to 18 mm short axis. Normal sized mediastinal lymph nodes. Lungs/Pleura: Emphysematous changes. Dependent atelectasis. Lungs otherwise clear. No pulmonary infiltrate, pleural effusion, or pneumothorax. No pulmonary mass/nodule. Musculoskeletal: No acute osseous findings.  Review of the MIP images confirms the above findings. CT ABDOMEN and PELVIS FINDINGS Hepatobiliary: Gallbladder and liver normal appearance Pancreas: Atrophic without focal mass Spleen: Normal appearance Adrenals/Urinary Tract: Cortical scarring at inferior pole LEFT kidney. Adrenal glands, kidneys, ureters, and bladder otherwise normal appearance Stomach/Bowel: Appendix not visualized, no pericecal inflammatory process seen. Stomach decompressed. Bowel wall thickening of descending colon with mild surrounding infiltrative changes over a long length favoring descending colitis. Minimal thickening of LEFT lateral conal fascia. Sigmoid diverticulosis without evidence of diverticulitis. Remaining bowel loops unremarkable. Vascular/Lymphatic: Atherosclerotic calcifications aorta, iliac arteries, visceral arteries. Infrarenal abdominal aortic aneurysm 4.2 x 4.0 cm in greatest axial dimensions extending 6.6 cm length, terminating above bifurcation. Moderate thrombus. No perianeurysmal infiltration/hemorrhage. No adenopathy. Reproductive: Atrophic uterus with unremarkable adnexa Other: No free air or free fluid.  No hernia. Musculoskeletal: Osseous demineralization. Review of the MIP images confirms the above findings. IMPRESSION: No evidence of pulmonary embolism. Large RIGHT breast mass 8.1 cm greatest size most consistent with malignancy with associated RIGHT axillary adenopathy. Additional small RIGHT breast nodule 14 x 7 mm question mass versus intramammary node. COPD. Descending  colitis. Sigmoid diverticulosis without evidence of diverticulitis. Infrarenal abdominal aortic aneurysm 4.2 cm in greatest axial dimensions; Recommend follow-up every 12 months and vascular consultation. This recommendation follows ACR consensus guidelines: White Paper of the ACR Incidental Findings Committee II on Vascular Findings. J Am Coll Radiol 2013; 10:789-794. Emphysema (ICD10-J43.9). Aortic Atherosclerosis (ICD10-I70.0). Aortic aneurysm NOS (ICD10-I71.9).: Findings called to Dr.  Francia Greaves on 04/15/2020 at 0857 hours. Electronically Signed   By: Lavonia Dana M.D.   On: 04/15/2020 08:58   CT ABDOMEN PELVIS W CONTRAST  Result Date: 04/15/2020 CLINICAL DATA:  Abdominal pain, nonlocalized, weakness, fatigue, chronic RIGHT breast infection for years drainage EXAM: CT ANGIOGRAPHY CHEST CT ABDOMEN AND PELVIS WITH CONTRAST TECHNIQUE: Multidetector CT imaging of the chest was performed using the standard protocol during bolus administration of intravenous contrast. Multiplanar CT image reconstructions and MIPs were obtained to evaluate the vascular anatomy. Multidetector CT imaging of the abdomen and pelvis was performed using the standard protocol during bolus administration of intravenous contrast. CONTRAST:  133mL OMNIPAQUE IOHEXOL 350 MG/ML SOLN IV. No oral contrast. COMPARISON:  None FINDINGS: CTA CHEST FINDINGS Cardiovascular: Atherosclerotic calcifications aorta, coronary arteries, and proximal great vessels. Aorta normal caliber without aneurysm or dissection. Heart unremarkable. No pericardial effusion. Pulmonary arteries adequately opacified and patent. No evidence of pulmonary embolism. Mediastinum/Nodes: Base of cervical region normal appearance. Esophagus unremarkable. Large lobulated soft tissue mass identified at RIGHT breast extending through skin surface, 8.1 x 4.9 x 7.1 cm in size most consistent with malignancy rather than infection. Mass extends to the chest wall with loss of fat plane between  the mass and the underlying pectoralis muscle. Additional small nodule within RIGHT breast 14 x 7 mm question second mass versus lymph node. RIGHT axillary adenopathy with nodes measuring up to 18 mm short axis. Normal sized mediastinal lymph nodes. Lungs/Pleura: Emphysematous changes. Dependent atelectasis. Lungs otherwise clear. No pulmonary infiltrate, pleural effusion, or pneumothorax. No pulmonary mass/nodule. Musculoskeletal: No acute osseous findings. Review of the MIP images confirms the above findings. CT ABDOMEN and PELVIS FINDINGS Hepatobiliary: Gallbladder and liver normal appearance Pancreas: Atrophic without focal mass Spleen: Normal appearance Adrenals/Urinary Tract: Cortical scarring at inferior pole LEFT kidney. Adrenal glands, kidneys, ureters, and bladder otherwise normal appearance Stomach/Bowel: Appendix not visualized, no pericecal inflammatory process seen. Stomach decompressed. Bowel wall thickening of descending colon with mild surrounding infiltrative changes over a  long length favoring descending colitis. Minimal thickening of LEFT lateral conal fascia. Sigmoid diverticulosis without evidence of diverticulitis. Remaining bowel loops unremarkable. Vascular/Lymphatic: Atherosclerotic calcifications aorta, iliac arteries, visceral arteries. Infrarenal abdominal aortic aneurysm 4.2 x 4.0 cm in greatest axial dimensions extending 6.6 cm length, terminating above bifurcation. Moderate thrombus. No perianeurysmal infiltration/hemorrhage. No adenopathy. Reproductive: Atrophic uterus with unremarkable adnexa Other: No free air or free fluid.  No hernia. Musculoskeletal: Osseous demineralization. Review of the MIP images confirms the above findings. IMPRESSION: No evidence of pulmonary embolism. Large RIGHT breast mass 8.1 cm greatest size most consistent with malignancy with associated RIGHT axillary adenopathy. Additional small RIGHT breast nodule 14 x 7 mm question mass versus intramammary node.  COPD. Descending colitis. Sigmoid diverticulosis without evidence of diverticulitis. Infrarenal abdominal aortic aneurysm 4.2 cm in greatest axial dimensions; Recommend follow-up every 12 months and vascular consultation. This recommendation follows ACR consensus guidelines: White Paper of the ACR Incidental Findings Committee II on Vascular Findings. J Am Coll Radiol 2013; 10:789-794. Emphysema (ICD10-J43.9). Aortic Atherosclerosis (ICD10-I70.0). Aortic aneurysm NOS (ICD10-I71.9).: Findings called to Dr.  Francia Greaves on 04/15/2020 at 0857 hours. Electronically Signed   By: Lavonia Dana M.D.   On: 04/15/2020 08:58   DG Chest Port 1 View  Result Date: 04/15/2020 CLINICAL DATA:  Recent syncopal episode EXAM: PORTABLE CHEST 1 VIEW COMPARISON:  None. FINDINGS: Cardiac shadows within normal limits. Aortic calcifications are noted. Lungs are hyperinflated bilaterally. Rounded density is noted in the right base measuring proximally 6 cm. This may represent fluid within the major fissure although the possibility of underlying mass deserves consideration. CT of the chest with contrast is recommended for further evaluation. IMPRESSION: Rounded density in the right lung base suspicious for underlying mass. CT is recommended for further evaluation. Electronically Signed   By: Inez Catalina M.D.   On: 04/15/2020 07:36   VAS [place] LOWER EXTREMITY VENOUS (DVT)  Result Date: 04/16/2020  Lower Venous DVT Study Indications: Rapidly rising D-dimer & Covid+.  Comparison Study: No previous exams Performing Technologist: Rogelia Rohrer  Examination Guidelines: A complete evaluation includes B-mode imaging, spectral Doppler, color Doppler, and power Doppler as needed of all accessible portions of each vessel. Bilateral testing is considered an integral part of a complete examination. Limited examinations for reoccurring indications may be performed as noted. The reflux portion of the exam is performed with the patient in reverse Trendelenburg.   +---------+---------------+---------+-----------+----------+--------------+ RIGHT    CompressibilityPhasicitySpontaneityPropertiesThrombus Aging +---------+---------------+---------+-----------+----------+--------------+ CFV      Full           Yes      Yes                                 +---------+---------------+---------+-----------+----------+--------------+ SFJ      Full                                                        +---------+---------------+---------+-----------+----------+--------------+ FV Prox  Full           Yes      Yes                                 +---------+---------------+---------+-----------+----------+--------------+ FV Mid   Full  Yes      Yes                                 +---------+---------------+---------+-----------+----------+--------------+ FV DistalFull           Yes      Yes                                 +---------+---------------+---------+-----------+----------+--------------+ PFV      Full                                                        +---------+---------------+---------+-----------+----------+--------------+ POP      Full           Yes      Yes                                 +---------+---------------+---------+-----------+----------+--------------+ PTV      Full                                                        +---------+---------------+---------+-----------+----------+--------------+ PERO     Full                                                        +---------+---------------+---------+-----------+----------+--------------+   +---------+---------------+---------+-----------+----------+--------------+ LEFT     CompressibilityPhasicitySpontaneityPropertiesThrombus Aging +---------+---------------+---------+-----------+----------+--------------+ CFV      Full                                                         +---------+---------------+---------+-----------+----------+--------------+ SFJ      Full                                                        +---------+---------------+---------+-----------+----------+--------------+ FV Prox  Full           Yes      Yes                                 +---------+---------------+---------+-----------+----------+--------------+ FV Mid   Full           Yes      Yes                                 +---------+---------------+---------+-----------+----------+--------------+ FV DistalFull           Yes  Yes                                 +---------+---------------+---------+-----------+----------+--------------+ PFV      Full                                                        +---------+---------------+---------+-----------+----------+--------------+ POP      Full           Yes      Yes                                 +---------+---------------+---------+-----------+----------+--------------+ PTV      Full                                                        +---------+---------------+---------+-----------+----------+--------------+ PERO     Full                                                        +---------+---------------+---------+-----------+----------+--------------+     Summary: BILATERAL: - No evidence of deep vein thrombosis seen in the lower extremities, bilaterally. - No evidence of superficial venous thrombosis in the lower extremities, bilaterally. -No evidence of popliteal cyst, bilaterally.   *See table(s) above for measurements and observations. Electronically signed by Jamelle Haring on 04/16/2020 at 5:48:36 PM.    Final      DG Wrist Complete Left  Result Date: 04/16/2020 CLINICAL DATA:  Left wrist swelling. EXAM: LEFT WRIST - COMPLETE 3+ VIEW COMPARISON:  None. FINDINGS: Degenerative changes between the base of the first metacarpal and trapezium. Degenerative changes between the trapezium and  scaphoid. Soft tissue swelling best seen on the lateral view. No fractures or bony erosions identified. IMPRESSION: Soft tissue swelling.  Degenerative changes as above. Electronically Signed   By: Dorise Bullion III M.D   On: 04/16/2020 12:33   CT Head Wo Contrast  Result Date: 04/15/2020 CLINICAL DATA:  Syncope with normal neuro exam EXAM: CT HEAD WITHOUT CONTRAST TECHNIQUE: Contiguous axial images were obtained from the base of the skull through the vertex without intravenous contrast. COMPARISON:  Brain MRI 01/02/2017 FINDINGS: Brain: No evidence of acute infarction, hemorrhage, hydrocephalus, extra-axial collection or mass effect. 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 Stable calcification in the right posterior frontal white matter age congruent cerebral volume loss. Known meningioma at the right vertex again measuring 9 mm. Vascular: No hyperdense vessel or unexpected calcification. Skull: Normal. Negative for fracture or focal lesion. Sinuses/Orbits: No acute finding. IMPRESSION: No acute finding or change from 2018 Electronically Signed   By: Monte Fantasia M.D.   On: 04/15/2020 08:42   CT Angio Chest PE W and/or Wo Contrast  Result Date: 04/15/2020 CLINICAL DATA:  Abdominal pain, nonlocalized, weakness, fatigue, chronic RIGHT breast infection for years drainage EXAM: CT ANGIOGRAPHY CHEST CT ABDOMEN AND PELVIS  WITH CONTRAST TECHNIQUE: Multidetector CT imaging of the chest was performed using the standard protocol during bolus administration of intravenous contrast. Multiplanar CT image reconstructions and MIPs were obtained to evaluate the vascular anatomy. Multidetector CT imaging of the abdomen and pelvis was performed using the standard protocol during bolus administration of intravenous contrast. CONTRAST:  140mL OMNIPAQUE IOHEXOL 350 MG/ML SOLN IV. No oral contrast. COMPARISON:  None FINDINGS: CTA CHEST FINDINGS Cardiovascular: Atherosclerotic  calcifications aorta, coronary arteries, and proximal great vessels. Aorta normal caliber without aneurysm or dissection. Heart unremarkable. No pericardial effusion. Pulmonary arteries adequately opacified and patent. No evidence of pulmonary embolism. Mediastinum/Nodes: Base of cervical region normal appearance. Esophagus unremarkable. Large lobulated soft tissue mass identified at RIGHT breast extending through skin surface, 8.1 x 4.9 x 7.1 cm in size most consistent with malignancy rather than infection. Mass extends to the chest wall with loss of fat plane between the mass and the underlying pectoralis muscle. Additional small nodule within RIGHT breast 14 x 7 mm question second mass versus lymph node. RIGHT axillary adenopathy with nodes measuring up to 18 mm short axis. Normal sized mediastinal lymph nodes. Lungs/Pleura: Emphysematous changes. Dependent atelectasis. Lungs otherwise clear. No pulmonary infiltrate, pleural effusion, or pneumothorax. No pulmonary mass/nodule. Musculoskeletal: No acute osseous findings. Review of the MIP images confirms the above findings. CT ABDOMEN and PELVIS FINDINGS Hepatobiliary: Gallbladder and liver normal appearance Pancreas: Atrophic without focal mass Spleen: Normal appearance Adrenals/Urinary Tract: Cortical scarring at inferior pole LEFT kidney. Adrenal glands, kidneys, ureters, and bladder otherwise normal appearance Stomach/Bowel: Appendix not visualized, no pericecal inflammatory process seen. Stomach decompressed. Bowel wall thickening of descending colon with mild surrounding infiltrative changes over a long length favoring descending colitis. Minimal thickening of LEFT lateral conal fascia. Sigmoid diverticulosis without evidence of diverticulitis. Remaining bowel loops unremarkable. Vascular/Lymphatic: Atherosclerotic calcifications aorta, iliac arteries, visceral arteries. Infrarenal abdominal aortic aneurysm 4.2 x 4.0 cm in greatest axial dimensions extending  6.6 cm length, terminating above bifurcation. Moderate thrombus. No perianeurysmal infiltration/hemorrhage. No adenopathy. Reproductive: Atrophic uterus with unremarkable adnexa Other: No free air or free fluid.  No hernia. Musculoskeletal: Osseous demineralization. Review of the MIP images confirms the above findings. IMPRESSION: No evidence of pulmonary embolism. Large RIGHT breast mass 8.1 cm greatest size most consistent with malignancy with associated RIGHT axillary adenopathy. Additional small RIGHT breast nodule 14 x 7 mm question mass versus intramammary node. COPD. Descending colitis. Sigmoid diverticulosis without evidence of diverticulitis. Infrarenal abdominal aortic aneurysm 4.2 cm in greatest axial dimensions; Recommend follow-up every 12 months and vascular consultation. This recommendation follows ACR consensus guidelines: White Paper of the ACR Incidental Findings Committee II on Vascular Findings. J Am Coll Radiol 2013; 10:789-794. Emphysema (ICD10-J43.9). Aortic Atherosclerosis (ICD10-I70.0). Aortic aneurysm NOS (ICD10-I71.9).: Findings called to Dr.  Francia Greaves on 04/15/2020 at 0857 hours. Electronically Signed   By: Lavonia Dana M.D.   On: 04/15/2020 08:58   CT ABDOMEN PELVIS W CONTRAST  Result Date: 04/15/2020 CLINICAL DATA:  Abdominal pain, nonlocalized, weakness, fatigue, chronic RIGHT breast infection for years drainage EXAM: CT ANGIOGRAPHY CHEST CT ABDOMEN AND PELVIS WITH CONTRAST TECHNIQUE: Multidetector CT imaging of the chest was performed using the standard protocol during bolus administration of intravenous contrast. Multiplanar CT image reconstructions and MIPs were obtained to evaluate the vascular anatomy. Multidetector CT imaging of the abdomen and pelvis was performed using the standard protocol during bolus administration of intravenous contrast. CONTRAST:  138mL OMNIPAQUE IOHEXOL 350 MG/ML SOLN IV. No oral contrast. COMPARISON:  None FINDINGS:  CTA CHEST FINDINGS Cardiovascular:  Atherosclerotic calcifications aorta, coronary arteries, and proximal great vessels. Aorta normal caliber without aneurysm or dissection. Heart unremarkable. No pericardial effusion. Pulmonary arteries adequately opacified and patent. No evidence of pulmonary embolism. Mediastinum/Nodes: Base of cervical region normal appearance. Esophagus unremarkable. Large lobulated soft tissue mass identified at RIGHT breast extending through skin surface, 8.1 x 4.9 x 7.1 cm in size most consistent with malignancy rather than infection. Mass extends to the chest wall with loss of fat plane between the mass and the underlying pectoralis muscle. Additional small nodule within RIGHT breast 14 x 7 mm question second mass versus lymph node. RIGHT axillary adenopathy with nodes measuring up to 18 mm short axis. Normal sized mediastinal lymph nodes. Lungs/Pleura: Emphysematous changes. Dependent atelectasis. Lungs otherwise clear. No pulmonary infiltrate, pleural effusion, or pneumothorax. No pulmonary mass/nodule. Musculoskeletal: No acute osseous findings. Review of the MIP images confirms the above findings. CT ABDOMEN and PELVIS FINDINGS Hepatobiliary: Gallbladder and liver normal appearance Pancreas: Atrophic without focal mass Spleen: Normal appearance Adrenals/Urinary Tract: Cortical scarring at inferior pole LEFT kidney. Adrenal glands, kidneys, ureters, and bladder otherwise normal appearance Stomach/Bowel: Appendix not visualized, no pericecal inflammatory process seen. Stomach decompressed. Bowel wall thickening of descending colon with mild surrounding infiltrative changes over a long length favoring descending colitis. Minimal thickening of LEFT lateral conal fascia. Sigmoid diverticulosis without evidence of diverticulitis. Remaining bowel loops unremarkable. Vascular/Lymphatic: Atherosclerotic calcifications aorta, iliac arteries, visceral arteries. Infrarenal abdominal aortic aneurysm 4.2 x 4.0 cm in greatest axial  dimensions extending 6.6 cm length, terminating above bifurcation. Moderate thrombus. No perianeurysmal infiltration/hemorrhage. No adenopathy. Reproductive: Atrophic uterus with unremarkable adnexa Other: No free air or free fluid.  No hernia. Musculoskeletal: Osseous demineralization. Review of the MIP images confirms the above findings. IMPRESSION: No evidence of pulmonary embolism. Large RIGHT breast mass 8.1 cm greatest size most consistent with malignancy with associated RIGHT axillary adenopathy. Additional small RIGHT breast nodule 14 x 7 mm question mass versus intramammary node. COPD. Descending colitis. Sigmoid diverticulosis without evidence of diverticulitis. Infrarenal abdominal aortic aneurysm 4.2 cm in greatest axial dimensions; Recommend follow-up every 12 months and vascular consultation. This recommendation follows ACR consensus guidelines: White Paper of the ACR Incidental Findings Committee II on Vascular Findings. J Am Coll Radiol 2013; 10:789-794. Emphysema (ICD10-J43.9). Aortic Atherosclerosis (ICD10-I70.0). Aortic aneurysm NOS (ICD10-I71.9).: Findings called to Dr.  Francia Greaves on 04/15/2020 at 0857 hours. Electronically Signed   By: Lavonia Dana M.D.   On: 04/15/2020 08:58   DG Chest Port 1 View  Result Date: 04/15/2020 CLINICAL DATA:  Recent syncopal episode EXAM: PORTABLE CHEST 1 VIEW COMPARISON:  None. FINDINGS: Cardiac shadows within normal limits. Aortic calcifications are noted. Lungs are hyperinflated bilaterally. Rounded density is noted in the right base measuring proximally 6 cm. This may represent fluid within the major fissure although the possibility of underlying mass deserves consideration. CT of the chest with contrast is recommended for further evaluation. IMPRESSION: Rounded density in the right lung base suspicious for underlying mass. CT is recommended for further evaluation. Electronically Signed   By: Inez Catalina M.D.   On: 04/15/2020 07:36   VAS [place] LOWER EXTREMITY  VENOUS (DVT)  Result Date: 04/16/2020  Lower Venous DVT Study Indications: Rapidly rising D-dimer & Covid+.  Comparison Study: No previous exams Performing Technologist: Rogelia Rohrer  Examination Guidelines: A complete evaluation includes B-mode imaging, spectral Doppler, color Doppler, and power Doppler as needed of all accessible portions of each vessel. Bilateral testing is considered an  integral part of a complete examination. Limited examinations for reoccurring indications may be performed as noted. The reflux portion of the exam is performed with the patient in reverse Trendelenburg.  +---------+---------------+---------+-----------+----------+--------------+ RIGHT    CompressibilityPhasicitySpontaneityPropertiesThrombus Aging +---------+---------------+---------+-----------+----------+--------------+ CFV      Full           Yes      Yes                                 +---------+---------------+---------+-----------+----------+--------------+ SFJ      Full                                                        +---------+---------------+---------+-----------+----------+--------------+ FV Prox  Full           Yes      Yes                                 +---------+---------------+---------+-----------+----------+--------------+ FV Mid   Full           Yes      Yes                                 +---------+---------------+---------+-----------+----------+--------------+ FV DistalFull           Yes      Yes                                 +---------+---------------+---------+-----------+----------+--------------+ PFV      Full                                                        +---------+---------------+---------+-----------+----------+--------------+ POP      Full           Yes      Yes                                 +---------+---------------+---------+-----------+----------+--------------+ PTV      Full                                                         +---------+---------------+---------+-----------+----------+--------------+ PERO     Full                                                        +---------+---------------+---------+-----------+----------+--------------+   +---------+---------------+---------+-----------+----------+--------------+ LEFT     CompressibilityPhasicitySpontaneityPropertiesThrombus Aging +---------+---------------+---------+-----------+----------+--------------+ CFV      Full                                                        +---------+---------------+---------+-----------+----------+--------------+  SFJ      Full                                                        +---------+---------------+---------+-----------+----------+--------------+ FV Prox  Full           Yes      Yes                                 +---------+---------------+---------+-----------+----------+--------------+ FV Mid   Full           Yes      Yes                                 +---------+---------------+---------+-----------+----------+--------------+ FV DistalFull           Yes      Yes                                 +---------+---------------+---------+-----------+----------+--------------+ PFV      Full                                                        +---------+---------------+---------+-----------+----------+--------------+ POP      Full           Yes      Yes                                 +---------+---------------+---------+-----------+----------+--------------+ PTV      Full                                                        +---------+---------------+---------+-----------+----------+--------------+ PERO     Full                                                        +---------+---------------+---------+-----------+----------+--------------+     Summary: BILATERAL: - No evidence of deep vein thrombosis seen in the lower extremities, bilaterally. -  No evidence of superficial venous thrombosis in the lower extremities, bilaterally. -No evidence of popliteal cyst, bilaterally.   *See table(s) above for measurements and observations. Electronically signed by Jamelle Haring on 04/16/2020 at 5:48:36 PM.    Final    Assessment and Plan:  1.  Fungating right breast mass 2.  COVID-19 infection 3.  Hypertension 4.  History of CVA 5.  Hyperlipidemia 6.  Poor social situation/homelessness  -Discussed imaging findings as well as potential diagnosis of breast cancer with the patient.  I do not see any evidence of distant mets noted on CT scan.  We discussed that we will await the  biopsy to confirm the diagnosis and that we will need to determine the exact type of breast cancer before we can recommend treatment options. -We will arrange for outpatient follow-up at the cancer center to discuss treatment options. -The patient is fully vaccinated but has an incidental infection: Infection.  She is asymptomatic.  Receiving 3 days of remdesivir per hospitalist. -Recommend TOC consult to assist with determining a safe place for the patient to discharge to once medically stable.  She is currently living in a car with her daughter for the past 4 to 5 months.  Thank you for this referral.   Mikey Bussing, DNP, AGPCNP-BC, AOCNP   Attending Note  I personally saw the patient, reviewed the chart and examined the patient. The plan of care was discussed with the patient and the admitting team. I agree with the assessment and plan as documented above.  I conducted the majority of the decision making for the patient.  Thank you very much for the consultation. 1.  Fungating right breast mass: Biopsy has been performed awaiting the results. Once we have the prognostic panel then we can discuss treatment options. 2. COVID-19: On remdesivir 3.  Social issues: Patient has been homeless which limits her ability to treat her.  If there is a family member that she can stay  with, we would be able to provide her medical care easily.

## 2020-04-18 NOTE — Progress Notes (Signed)
Patient ID: Elizabeth Chase, female   DOB: 12/25/1940, 80 y.o.   MRN: [id number]   Procedure note: Excisional biopsy fungating right breast mass  Indications: This is a 80 year old female with a fungating right breast mass.  After discussion with the patient and oncology, a tissue diagnosis is necessary therefore decision has been made to proceed with either a Tru-Cut or excisional biopsy of the large breast mass eroding through the skin.  I discussed this with the patient.  I discussed the risk which includes but is not limited to bleeding and infection.  I explained the reasons for this with her.  She consented for the procedure  Procedure: As expected, the patient was insensate on the breast mass.  Using a #15 blade scalpel, I performed a wedge excision removing 2 pieces of tissue from the fungating mass.  This was placed in formalin and sent to pathology.  I then achieved hemostasis in the small wound with silver nitrate and pressure.  A pressure dressing was then applied.  She was then placed in a breast binder.  She tolerated procedure well.  She had no complaints at the end of the procedure.

## 2020-04-18 NOTE — Progress Notes (Signed)
PROGRESS NOTE                                                                                                                                                                                                             Patient Demographics:    Elizabeth Chase, is a 80 y.o. female, DOB - [birth year], ZDG:[id number]  Outpatient Primary MD for the patient is Lesleigh Noe, MD    LOS - 3  Admit date - 04/14/2020    Chief Complaint  Patient presents with  . Breast Infection        Brief Narrative (HPI from H&P)  - Elizabeth Chase is a 80 y.o. female with medical history significant of hypertension and hyperlipidemia who presented episode of syncope where she stood up to walk and almost passed out, was caught by her daughter on her way down, lost consciousness for a few minutes with no bowel or bladder incontinence, in the ER she was found to be dehydrated and hypotensive, on exam she was found to have a fungating right breast mass, imaging suggested colitis along with right breast malignancy and she was admitted to the hospital.   Subjective:   Patient in bed, appears comfortable, denies any headache, no fever, no chest pain or pressure, no shortness of breath , no abdominal pain. No focal weakness.    Assessment  & Plan :     1.  Syncope due to hypotension.  Caused by dehydration and possible mild colitis.  Currently no diarrhea, has been hydrated with IV fluids and is feeling better already, continue hydration, PT OT and monitor.  Head CT is nonacute and exam is nonfocal  2.  Possible colitis.  Currently no diarrhea.  Agree with IV Flagyl and cephalosporin continue.  3.  Fungating right breast mass with right axillary lymphadenopathy.  Highly suspicious for malignancy.  Seen by general surgery recommend IR to obtain tissue sample - likely 04/19/19, also seen by oncology who will follow up with her in the office, for now bleeding around  the mass has been stable after cauterization by general surgery.  Wound care to continue taking care per wound care protocol.  4.  Left wrist swelling appears to be a hematoma at the site of IV.  IV removed, stable X-Ray.  Much improved.  5. Fully vaccinated with incidental Covid  infection.  Agree with 3 days of remdesivir.  6.  Elevated D-dimer.  Check leg venous ultrasound, holding Lovenox as right breast masses bleeding, transition to heparin and SCDs and monitor closely.  CTA lungs is negative.    Recent Labs  Lab 04/14/20 2259 04/16/20 0431 04/17/20 0423 04/18/20 0114  WBC 7.8 8.1 6.0 6.0  HGB 12.0 10.4* 9.9* 10.3*  HCT 39.3 31.8* 29.9* 32.2*  PLT 336 255 279 300  MCV 92.7 90.9 88.7 89.4  MCH 28.3 29.7 29.4 28.6  MCHC 30.5 32.7 33.1 32.0  RDW 13.5 13.7 13.6 13.2  LYMPHSABS 1.0  --  1.0 1.0  MONOABS 0.4  --  0.5 0.6  EOSABS 0.1  --  0.3 0.4  BASOSABS 0.0  --  0.0 0.0    Recent Labs  Lab 04/14/20 2259 04/15/20 0742 04/15/20 0918 04/15/20 1254 04/15/20 1302 04/16/20 0431 04/17/20 0423 04/18/20 0114  NA 139  --   --   --   --  134* 138 137  K 3.6  --   --   --   --  3.9 3.9 3.6  CL 104  --   --   --   --  107 105 104  CO2 23  --   --   --   --  20* 25 25  GLUCOSE 212*  --   --   --   --  113* 102* 114*  BUN 18  --   --   --   --  13 11 14   CREATININE 1.06*  --   --   --   --  0.79 0.67 0.73  CALCIUM 9.6  --   --   --   --  7.8* 8.1* 8.2*  AST 20  --   --   --   --   --  17 17  ALT 14  --   --   --   --   --  9 9  ALKPHOS 53  --   --   --   --   --  39 39  BILITOT 0.7  --   --   --   --   --  0.7 0.4  ALBUMIN 3.4*  --   --   --   --   --  2.4* 2.4*  MG  --   --   --   --   --   --  1.7 1.7  CRP  --   --   --   --  4.3*  --  10.5* 7.7*  DDIMER  --   --   --   --  3.46*  --  2.55* 2.31*  PROCALCITON  --   --   --   --  0.10  --   --   --   LATICACIDVEN  --  2.2* 1.7  --   --   --   --   --   INR  --  1.1  --   --   --   --   --   --   TSH  --   --   --  0.779   --   --   --   --   BNP  --   --   --  64.0  --   --  67.9 79.9    Recent Labs  Lab 04/14/20 2259 04/15/20 0742 04/15/20 1914 04/15/20 1254 04/15/20 1302 04/16/20 0431 04/17/20 0423 04/18/20 0114  WBC 7.8  --   --   --   --  8.1 6.0 6.0  HGB 12.0  --   --   --   --  10.4* 9.9* 10.3*  HCT 39.3  --   --   --   --  31.8* 29.9* 32.2*  PLT 336  --   --   --   --  255 279 300  CRP  --   --   --   --  4.3*  --  10.5* 7.7*  BNP  --   --   --  64.0  --   --  67.9 79.9  DDIMER  --   --   --   --  3.46*  --  2.55* 2.31*  PROCALCITON  --   --   --   --  0.10  --   --   --   AST 20  --   --   --   --   --  17 17  ALT 14  --   --   --   --   --  9 9  ALKPHOS 53  --   --   --   --   --  39 39  BILITOT 0.7  --   --   --   --   --  0.7 0.4  ALBUMIN 3.4*  --   --   --   --   --  2.4* 2.4*  INR  --  1.1  --   --   --   --   --   --   LATICACIDVEN  --  2.2* 1.7  --   --   --   --   --   SARSCOV2NAA  --  POSITIVE*  --   --   --   --   --   --           Condition - Extremely Guarded  Family Communication  :  Daughter bedside  Code Status :  Full  Consults  :  CCS, IR, Onc  PUD Prophylaxis :    Procedures  :     CT head - Non acute  CT Chest - Abd - Pelvis - No evidence of pulmonary embolism. Large RIGHT breast mass 8.1 cm greatest size most consistent with malignancy with associated RIGHT axillary adenopathy. Additional small RIGHT breast nodule 14 x 7 mm question mass versus intramammary node. COPD. Descending colitis. Sigmoid diverticulosis without evidence of diverticulitis. Infrarenal abdominal aortic aneurysm 4.2 cm in greatest axial dimensions      Disposition Plan  :    Status is: Inpatient  Remains inpatient appropriate because:IV treatments appropriate due to intensity of illness or inability to take PO   Dispo: The patient is from: Home              Anticipated d/c is to: Home              Patient currently is not medically stable to d/c.   Difficult to place  patient No  DVT Prophylaxis  :   Heparin - SCDs   Lab Results  Component Value Date   PLT 300 04/18/2020    Diet :  Diet Order            Diet regular Room service appropriate? Yes; Fluid consistency: Thin  Diet effective now  Inpatient Medications  Scheduled Meds: . vitamin C  500 mg Oral Daily  . atorvastatin  20 mg Oral q1800  . fluticasone furoate-vilanterol  1 puff Inhalation Daily  . heparin injection (subcutaneous)  5,000 Units Subcutaneous Q8H  . lidocaine-EPINEPHrine  30 mL Intradermal Once  . sodium chloride flush  3 mL Intravenous Q12H  . zinc sulfate  220 mg Oral Daily   Continuous Infusions: .  ceFAZolin (ANCEF) IV 1 g (04/18/20 0849)  . metronidazole 500 mg (04/18/20 0430)   PRN Meds:.acetaminophen **OR** [DISCONTINUED] acetaminophen, albuterol, guaiFENesin-dextromethorphan, silver nitrate applicators  Antibiotics  :    Anti-infectives (From admission, onward)   Start     Dose/Rate Route Frequency Ordered Stop   04/16/20 1000  remdesivir 100 mg in sodium chloride 0.9 % 100 mL IVPB       "Followed by" Linked Group Details   100 mg 200 mL/hr over 30 Minutes Intravenous Daily 04/15/20 1203 04/17/20 1109   04/15/20 1915  metroNIDAZOLE (FLAGYL) IVPB 500 mg        500 mg 100 mL/hr over 60 Minutes Intravenous Every 8 hours 04/15/20 1822     04/15/20 1630  ceFAZolin (ANCEF) IVPB 1 g/50 mL premix        1 g 100 mL/hr over 30 Minutes Intravenous Every 8 hours 04/15/20 1154     04/15/20 1400  remdesivir 200 mg in sodium chloride 0.9% 250 mL IVPB       "Followed by" Linked Group Details   200 mg 580 mL/hr over 30 Minutes Intravenous Once 04/15/20 1203 04/15/20 2101   04/15/20 0745  ceFAZolin (ANCEF) IVPB 1 g/50 mL premix        1 g 100 mL/hr over 30 Minutes Intravenous  Once 04/15/20 0732 04/15/20 0902       Time Spent in minutes  30   Lala Lund M.D on 04/18/2020 at 10:30 AM  To page go to [email or web address]   [facility] -   Office  [phone]    See all Orders from today for further details    Objective:   Vitals:   04/17/20 1150 04/17/20 1616 04/17/20 1916 04/18/20 0502  BP: (!) 144/96 (!) 149/59 (!) 170/78 130/71  Pulse: 77 79 82 78  Resp: 14 19 20 20   Temp: 98.6 F (37 C) 98.7 F (37.1 C) 98.3 F (36.8 C) 98 F (36.7 C)  TempSrc: Oral Oral Oral Axillary  SpO2: 96% 94% 93% 93%  Weight:      Height:        Wt Readings from Last 3 Encounters:  04/15/20 54.9 kg  03/01/17 (!) 558.7 kg  01/15/17 51.9 kg     Intake/Output Summary (Last 24 hours) at 04/18/2020 1030 Last data filed at 04/18/2020 1007 Gross per 24 hour  Intake 950.02 ml  Output 2 ml  Net 948.02 ml     Physical Exam  Awake Alert, No new F.N deficits, Normal affect [place].AT,PERRAL Supple Neck,No JVD, No cervical lymphadenopathy appriciated.  Symmetrical Chest wall movement, Good air movement bilaterally, CTAB RRR,No Gallops, Rubs or new Murmurs, No Parasternal Heave +ve B.Sounds, Abd Soft, No tenderness, No organomegaly appriciated, No rebound - guarding or rigidity. Right breast exam on 04/16/2020 with 2 female CNA's bedside, fungating mass with toilet paper covering it with blood oozing    Data Review:    CBC Recent Labs  Lab 04/14/20 2259 04/16/20 0431 04/17/20 0423 04/18/20 0114  WBC 7.8 8.1 6.0 6.0  HGB 12.0 10.4* 9.9* 10.3*  HCT 39.3 31.8* 29.9* 32.2*  PLT 336 255 279 300  MCV 92.7 90.9 88.7 89.4  MCH 28.3 29.7 29.4 28.6  MCHC 30.5 32.7 33.1 32.0  RDW 13.5 13.7 13.6 13.2  LYMPHSABS 1.0  --  1.0 1.0  MONOABS 0.4  --  0.5 0.6  EOSABS 0.1  --  0.3 0.4  BASOSABS 0.0  --  0.0 0.0    Recent Labs  Lab 04/14/20 2259 04/15/20 0742 04/15/20 0918 04/15/20 1254 04/15/20 1302 04/16/20 0431 04/17/20 0423 04/18/20 0114  NA 139  --   --   --   --  134* 138 137  K 3.6  --   --   --   --  3.9 3.9 3.6  CL 104  --   --   --   --  107 105 104  CO2 23  --   --   --   --  20* 25 25  GLUCOSE 212*  --   --   --   --   113* 102* 114*  BUN 18  --   --   --   --  13 11 14   CREATININE 1.06*  --   --   --   --  0.79 0.67 0.73  CALCIUM 9.6  --   --   --   --  7.8* 8.1* 8.2*  AST 20  --   --   --   --   --  17 17  ALT 14  --   --   --   --   --  9 9  ALKPHOS 53  --   --   --   --   --  39 39  BILITOT 0.7  --   --   --   --   --  0.7 0.4  ALBUMIN 3.4*  --   --   --   --   --  2.4* 2.4*  MG  --   --   --   --   --   --  1.7 1.7  CRP  --   --   --   --  4.3*  --  10.5* 7.7*  DDIMER  --   --   --   --  3.46*  --  2.55* 2.31*  PROCALCITON  --   --   --   --  0.10  --   --   --   LATICACIDVEN  --  2.2* 1.7  --   --   --   --   --   INR  --  1.1  --   --   --   --   --   --   TSH  --   --   --  0.779  --   --   --   --   BNP  --   --   --  64.0  --   --  67.9 79.9    ------------------------------------------------------------------------------------------------------------------ No results for input(s): CHOL, HDL, LDLCALC, TRIG, CHOLHDL, LDLDIRECT in the last 72 hours.  Lab Results  Component Value Date   HGBA1C 5.9 (H) 01/02/2017   ------------------------------------------------------------------------------------------------------------------ Recent Labs    04/15/20 1254  TSH 0.779    Cardiac Enzymes No results for input(s): CKMB, TROPONINI, MYOGLOBIN in the last 168 hours.  Invalid input(s): CK ------------------------------------------------------------------------------------------------------------------    Component Value Date/Time   BNP 79.9 04/18/2020 0114    Micro Results Recent Results (from the past  240 hour(s))  Culture, blood (routine x 2)     Status: None (Preliminary result)   Collection Time: 04/15/20  7:42 AM   Specimen: BLOOD  Result Value Ref Range Status   Specimen Description BLOOD LEFT ANTECUBITAL  Final   Special Requests   Final    BOTTLES DRAWN AEROBIC AND ANAEROBIC Blood Culture adequate volume   Culture   Final    NO GROWTH 2 DAYS Performed at [facility], [address] [place], [place] [address]    Report Status PENDING  Incomplete  Resp Panel by RT-PCR (Flu A&B, Covid) Nasopharyngeal Swab     Status: Abnormal   Collection Time: 04/15/20  7:42 AM   Specimen: Nasopharyngeal Swab; Nasopharyngeal(NP) swabs in vial transport medium  Result Value Ref Range Status   SARS Coronavirus 2 by RT PCR POSITIVE (A) NEGATIVE Final    Comment: RESULT CALLED TO, READ BACK BY AND VERIFIED WITH: RN S.BETRAND ON 04/15/2020 AT 0926 BY E.PARRISH (NOTE) SARS-CoV-2 target nucleic acids are DETECTED.  The SARS-CoV-2 RNA is generally detectable in upper respiratory specimens during the acute phase of infection. Positive results are indicative of the presence of the identified virus, but do not rule out bacterial infection or co-infection with other pathogens not detected by the test. Clinical correlation with patient history and other diagnostic information is necessary to determine patient infection status. The expected result is Negative.  Fact Sheet for Patients: EntrepreneurPulse.com.au  Fact Sheet for Healthcare Providers: IncredibleEmployment.be  This test is not yet approved or cleared by the [place] FDA and  has been authorized for detection and/or diagnosis of SARS-CoV-2 by FDA under an Emergency Use Authorization (EUA).  This EUA will remain in effect (meaning this  test can be used) for the duration of  the COVID-19 declaration under Section 564(b)(1) of the Act, 21 U.S.C. section 360bbb-3(b)(1), unless the authorization is terminated or revoked sooner.     Influenza A by PCR NEGATIVE NEGATIVE Final   Influenza B by PCR NEGATIVE NEGATIVE Final    Comment: (NOTE) The Xpert Xpress SARS-CoV-2/FLU/RSV plus assay is intended as an aid in the diagnosis of influenza from Nasopharyngeal swab specimens and should not be used as a sole basis for treatment. Nasal washings and aspirates are unacceptable  for Xpert Xpress SARS-CoV-2/FLU/RSV testing.  Fact Sheet for Patients: EntrepreneurPulse.com.au  Fact Sheet for Healthcare Providers: IncredibleEmployment.be  This test is not yet approved or cleared by the [place] FDA and has been authorized for detection and/or diagnosis of SARS-CoV-2 by FDA under an Emergency Use Authorization (EUA). This EUA will remain in effect (meaning this test can be used) for the duration of the COVID-19 declaration under Section 564(b)(1) of the Act, 21 U.S.C. section 360bbb-3(b)(1), unless the authorization is terminated or revoked.  Performed at [facility], [place] [address], [place], [place] [address]   Culture, blood (routine x 2)     Status: None (Preliminary result)   Collection Time: 04/15/20  7:43 AM   Specimen: BLOOD RIGHT HAND  Result Value Ref Range Status   Specimen Description BLOOD RIGHT HAND  Final   Special Requests   Final    BOTTLES DRAWN AEROBIC ONLY Blood Culture results may not be optimal due to an inadequate volume of blood received in culture bottles   Culture   Final    NO GROWTH 2 DAYS Performed at [facility], [place] [address], [place], [place] [address]    Report Status  PENDING  Incomplete    Radiology Reports DG Wrist Complete Left  Result Date: 04/16/2020 CLINICAL DATA:  Left wrist swelling. EXAM: LEFT WRIST - COMPLETE 3+ VIEW COMPARISON:  None. FINDINGS: Degenerative changes between the base of the first metacarpal and trapezium. Degenerative changes between the trapezium and scaphoid. Soft tissue swelling best seen on the lateral view. No fractures or bony erosions identified. IMPRESSION: Soft tissue swelling.  Degenerative changes as above. Electronically Signed   By: Dorise Bullion III M.D   On: 04/16/2020 12:33   CT Head Wo Contrast  Result Date: 04/15/2020 CLINICAL DATA:  Syncope with normal neuro exam EXAM: CT HEAD WITHOUT CONTRAST TECHNIQUE: Contiguous axial  images were obtained from the base of the skull through the vertex without intravenous contrast. COMPARISON:  Brain MRI 01/02/2017 FINDINGS: Brain: No evidence of acute infarction, hemorrhage, hydrocephalus, extra-axial collection or mass effect. 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 Stable calcification in the right posterior frontal white matter age congruent cerebral volume loss. Known meningioma at the right vertex again measuring 9 mm. Vascular: No hyperdense vessel or unexpected calcification. Skull: Normal. Negative for fracture or focal lesion. Sinuses/Orbits: No acute finding. IMPRESSION: No acute finding or change from 2018 Electronically Signed   By: Monte Fantasia M.D.   On: 04/15/2020 08:42   CT Angio Chest PE W and/or Wo Contrast  Result Date: 04/15/2020 CLINICAL DATA:  Abdominal pain, nonlocalized, weakness, fatigue, chronic RIGHT breast infection for years drainage EXAM: CT ANGIOGRAPHY CHEST CT ABDOMEN AND PELVIS WITH CONTRAST TECHNIQUE: Multidetector CT imaging of the chest was performed using the standard protocol during bolus administration of intravenous contrast. Multiplanar CT image reconstructions and MIPs were obtained to evaluate the vascular anatomy. Multidetector CT imaging of the abdomen and pelvis was performed using the standard protocol during bolus administration of intravenous contrast. CONTRAST:  159mL OMNIPAQUE IOHEXOL 350 MG/ML SOLN IV. No oral contrast. COMPARISON:  None FINDINGS: CTA CHEST FINDINGS Cardiovascular: Atherosclerotic calcifications aorta, coronary arteries, and proximal great vessels. Aorta normal caliber without aneurysm or dissection. Heart unremarkable. No pericardial effusion. Pulmonary arteries adequately opacified and patent. No evidence of pulmonary embolism. Mediastinum/Nodes: Base of cervical region normal appearance. Esophagus unremarkable. Large lobulated soft tissue mass identified at RIGHT breast extending  through skin surface, 8.1 x 4.9 x 7.1 cm in size most consistent with malignancy rather than infection. Mass extends to the chest wall with loss of fat plane between the mass and the underlying pectoralis muscle. Additional small nodule within RIGHT breast 14 x 7 mm question second mass versus lymph node. RIGHT axillary adenopathy with nodes measuring up to 18 mm short axis. Normal sized mediastinal lymph nodes. Lungs/Pleura: Emphysematous changes. Dependent atelectasis. Lungs otherwise clear. No pulmonary infiltrate, pleural effusion, or pneumothorax. No pulmonary mass/nodule. Musculoskeletal: No acute osseous findings. Review of the MIP images confirms the above findings. CT ABDOMEN and PELVIS FINDINGS Hepatobiliary: Gallbladder and liver normal appearance Pancreas: Atrophic without focal mass Spleen: Normal appearance Adrenals/Urinary Tract: Cortical scarring at inferior pole LEFT kidney. Adrenal glands, kidneys, ureters, and bladder otherwise normal appearance Stomach/Bowel: Appendix not visualized, no pericecal inflammatory process seen. Stomach decompressed. Bowel wall thickening of descending colon with mild surrounding infiltrative changes over a long length favoring descending colitis. Minimal thickening of LEFT lateral conal fascia. Sigmoid diverticulosis without evidence of diverticulitis. Remaining bowel loops unremarkable. Vascular/Lymphatic: Atherosclerotic calcifications aorta, iliac arteries, visceral arteries. Infrarenal abdominal aortic aneurysm 4.2 x 4.0 cm in greatest axial dimensions extending 6.6 cm length, terminating  above bifurcation. Moderate thrombus. No perianeurysmal infiltration/hemorrhage. No adenopathy. Reproductive: Atrophic uterus with unremarkable adnexa Other: No free air or free fluid.  No hernia. Musculoskeletal: Osseous demineralization. Review of the MIP images confirms the above findings. IMPRESSION: No evidence of pulmonary embolism. Large RIGHT breast mass 8.1 cm greatest  size most consistent with malignancy with associated RIGHT axillary adenopathy. Additional small RIGHT breast nodule 14 x 7 mm question mass versus intramammary node. COPD. Descending colitis. Sigmoid diverticulosis without evidence of diverticulitis. Infrarenal abdominal aortic aneurysm 4.2 cm in greatest axial dimensions; Recommend follow-up every 12 months and vascular consultation. This recommendation follows ACR consensus guidelines: White Paper of the ACR Incidental Findings Committee II on Vascular Findings. J Am Coll Radiol 2013; 10:789-794. Emphysema (ICD10-J43.9). Aortic Atherosclerosis (ICD10-I70.0). Aortic aneurysm NOS (ICD10-I71.9).: Findings called to Dr.  Francia Greaves on 04/15/2020 at 0857 hours. Electronically Signed   By: Lavonia Dana M.D.   On: 04/15/2020 08:58   CT ABDOMEN PELVIS W CONTRAST  Result Date: 04/15/2020 CLINICAL DATA:  Abdominal pain, nonlocalized, weakness, fatigue, chronic RIGHT breast infection for years drainage EXAM: CT ANGIOGRAPHY CHEST CT ABDOMEN AND PELVIS WITH CONTRAST TECHNIQUE: Multidetector CT imaging of the chest was performed using the standard protocol during bolus administration of intravenous contrast. Multiplanar CT image reconstructions and MIPs were obtained to evaluate the vascular anatomy. Multidetector CT imaging of the abdomen and pelvis was performed using the standard protocol during bolus administration of intravenous contrast. CONTRAST:  141mL OMNIPAQUE IOHEXOL 350 MG/ML SOLN IV. No oral contrast. COMPARISON:  None FINDINGS: CTA CHEST FINDINGS Cardiovascular: Atherosclerotic calcifications aorta, coronary arteries, and proximal great vessels. Aorta normal caliber without aneurysm or dissection. Heart unremarkable. No pericardial effusion. Pulmonary arteries adequately opacified and patent. No evidence of pulmonary embolism. Mediastinum/Nodes: Base of cervical region normal appearance. Esophagus unremarkable. Large lobulated soft tissue mass identified at RIGHT  breast extending through skin surface, 8.1 x 4.9 x 7.1 cm in size most consistent with malignancy rather than infection. Mass extends to the chest wall with loss of fat plane between the mass and the underlying pectoralis muscle. Additional small nodule within RIGHT breast 14 x 7 mm question second mass versus lymph node. RIGHT axillary adenopathy with nodes measuring up to 18 mm short axis. Normal sized mediastinal lymph nodes. Lungs/Pleura: Emphysematous changes. Dependent atelectasis. Lungs otherwise clear. No pulmonary infiltrate, pleural effusion, or pneumothorax. No pulmonary mass/nodule. Musculoskeletal: No acute osseous findings. Review of the MIP images confirms the above findings. CT ABDOMEN and PELVIS FINDINGS Hepatobiliary: Gallbladder and liver normal appearance Pancreas: Atrophic without focal mass Spleen: Normal appearance Adrenals/Urinary Tract: Cortical scarring at inferior pole LEFT kidney. Adrenal glands, kidneys, ureters, and bladder otherwise normal appearance Stomach/Bowel: Appendix not visualized, no pericecal inflammatory process seen. Stomach decompressed. Bowel wall thickening of descending colon with mild surrounding infiltrative changes over a long length favoring descending colitis. Minimal thickening of LEFT lateral conal fascia. Sigmoid diverticulosis without evidence of diverticulitis. Remaining bowel loops unremarkable. Vascular/Lymphatic: Atherosclerotic calcifications aorta, iliac arteries, visceral arteries. Infrarenal abdominal aortic aneurysm 4.2 x 4.0 cm in greatest axial dimensions extending 6.6 cm length, terminating above bifurcation. Moderate thrombus. No perianeurysmal infiltration/hemorrhage. No adenopathy. Reproductive: Atrophic uterus with unremarkable adnexa Other: No free air or free fluid.  No hernia. Musculoskeletal: Osseous demineralization. Review of the MIP images confirms the above findings. IMPRESSION: No evidence of pulmonary embolism. Large RIGHT breast mass  8.1 cm greatest size most consistent with malignancy with associated RIGHT axillary adenopathy. Additional small RIGHT breast nodule 14 x 7 mm  question mass versus intramammary node. COPD. Descending colitis. Sigmoid diverticulosis without evidence of diverticulitis. Infrarenal abdominal aortic aneurysm 4.2 cm in greatest axial dimensions; Recommend follow-up every 12 months and vascular consultation. This recommendation follows ACR consensus guidelines: White Paper of the ACR Incidental Findings Committee II on Vascular Findings. J Am Coll Radiol 2013; 10:789-794. Emphysema (ICD10-J43.9). Aortic Atherosclerosis (ICD10-I70.0). Aortic aneurysm NOS (ICD10-I71.9).: Findings called to Dr.  Francia Greaves on 04/15/2020 at 0857 hours. Electronically Signed   By: Lavonia Dana M.D.   On: 04/15/2020 08:58   DG Chest Port 1 View  Result Date: 04/15/2020 CLINICAL DATA:  Recent syncopal episode EXAM: PORTABLE CHEST 1 VIEW COMPARISON:  None. FINDINGS: Cardiac shadows within normal limits. Aortic calcifications are noted. Lungs are hyperinflated bilaterally. Rounded density is noted in the right base measuring proximally 6 cm. This may represent fluid within the major fissure although the possibility of underlying mass deserves consideration. CT of the chest with contrast is recommended for further evaluation. IMPRESSION: Rounded density in the right lung base suspicious for underlying mass. CT is recommended for further evaluation. Electronically Signed   By: Inez Catalina M.D.   On: 04/15/2020 07:36   VAS [place] LOWER EXTREMITY VENOUS (DVT)  Result Date: 04/16/2020  Lower Venous DVT Study Indications: Rapidly rising D-dimer & Covid+.  Comparison Study: No previous exams Performing Technologist: Rogelia Rohrer  Examination Guidelines: A complete evaluation includes B-mode imaging, spectral Doppler, color Doppler, and power Doppler as needed of all accessible portions of each vessel. Bilateral testing is considered an integral part of a  complete examination. Limited examinations for reoccurring indications may be performed as noted. The reflux portion of the exam is performed with the patient in reverse Trendelenburg.  +---------+---------------+---------+-----------+----------+--------------+ RIGHT    CompressibilityPhasicitySpontaneityPropertiesThrombus Aging +---------+---------------+---------+-----------+----------+--------------+ CFV      Full           Yes      Yes                                 +---------+---------------+---------+-----------+----------+--------------+ SFJ      Full                                                        +---------+---------------+---------+-----------+----------+--------------+ FV Prox  Full           Yes      Yes                                 +---------+---------------+---------+-----------+----------+--------------+ FV Mid   Full           Yes      Yes                                 +---------+---------------+---------+-----------+----------+--------------+ FV DistalFull           Yes      Yes                                 +---------+---------------+---------+-----------+----------+--------------+ PFV      Full                                                        +---------+---------------+---------+-----------+----------+--------------+  POP      Full           Yes      Yes                                 +---------+---------------+---------+-----------+----------+--------------+ PTV      Full                                                        +---------+---------------+---------+-----------+----------+--------------+ PERO     Full                                                        +---------+---------------+---------+-----------+----------+--------------+   +---------+---------------+---------+-----------+----------+--------------+ LEFT     CompressibilityPhasicitySpontaneityPropertiesThrombus Aging  +---------+---------------+---------+-----------+----------+--------------+ CFV      Full                                                        +---------+---------------+---------+-----------+----------+--------------+ SFJ      Full                                                        +---------+---------------+---------+-----------+----------+--------------+ FV Prox  Full           Yes      Yes                                 +---------+---------------+---------+-----------+----------+--------------+ FV Mid   Full           Yes      Yes                                 +---------+---------------+---------+-----------+----------+--------------+ FV DistalFull           Yes      Yes                                 +---------+---------------+---------+-----------+----------+--------------+ PFV      Full                                                        +---------+---------------+---------+-----------+----------+--------------+ POP      Full           Yes      Yes                                 +---------+---------------+---------+-----------+----------+--------------+  PTV      Full                                                        +---------+---------------+---------+-----------+----------+--------------+ PERO     Full                                                        +---------+---------------+---------+-----------+----------+--------------+     Summary: BILATERAL: - No evidence of deep vein thrombosis seen in the lower extremities, bilaterally. - No evidence of superficial venous thrombosis in the lower extremities, bilaterally. -No evidence of popliteal cyst, bilaterally.   *See table(s) above for measurements and observations. Electronically signed by Jamelle Haring on 04/16/2020 at 5:48:36 PM.    Final

## 2020-04-18 NOTE — TOC Progression Note (Addendum)
Transition of Care [facility]) - Progression Note    Patient Details  Name: Elizabeth Chase MRN: [id number] Date of Birth: [birth year]  [facility] CM/SW Contact  Sharlet Salina Mila Homer, LCSW Phone Number: 04/18/2020, 5:08 PM  Clinical Narrative:  CSW informed that patient in need of housing resources. CSW talked with patient by phone regarding her request and was current living situation. [name] reported that she was living with her son and his wife drove her off and she has lived with other family, but it didn't work out. Patient reported that her daughter [name] was working at [employer] and she was fired and has not worked since then, as she doesn't have money for gas. Other transport options as [employer], was discussed to get back and forth to work. Patient provided with infor re: [place] and other housing infor. CSW attempted to reach daughter [name] - [phone] (5:02 pm) and VM full. CSW sent text to daughter informing her that someone will call her on Tuesday, 3/8 regarding housing.         Expected Discharge Plan and Services                                                 Social Determinants of Health (SDOH) Interventions  Patient and daughter homeless.    Readmission Risk Interventions No flowsheet data found.

## 2020-04-18 NOTE — Progress Notes (Signed)
Occupational Therapy Evaluation Patient Details Name: Elizabeth Chase MRN: [id number] DOB: 1940-05-24 Today's Date: 04/18/2020    History of Present Illness Elizabeth Chase is a 80 y.o. 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 States they were kicked out of the hotel (where her daughter worked in housekeeping) because her daughter got into an argument with her coworker and they were both fired. Reports she has been living out of a car for 2 months and wants to talk with SW regarding housing options. CM/SW notified. Pt able to complete ADL and mobility close to  modified independent level. States she has been going to the bathroom on her own.Recommend pt ambulate frequently with nsg staff. If pt has surgery, please reorder OT. Signing off at this time.     Follow Up Recommendations  No OT follow up;Supervision - Intermittent    Equipment Recommendations  None recommended by OT    Recommendations for Other Services       Precautions / Restrictions Precautions Precautions: Other (comment)      Mobility Bed Mobility Overal bed mobility: Independent                  Transfers Overall transfer level: Modified independent                    Balance Overall balance assessment: Mild deficits observed, not formally tested                                         ADL either performed or assessed with clinical judgement   ADL Overall ADL's :  At baseline                                       General ADL Comments: breast binder     Vision         Perception     Praxis      Pertinent Vitals/Pain Pain Assessment: No/denies pain     Hand Dominance Right   Extremity/Trunk Assessment Upper Extremity Assessment Upper Extremity Assessment: Overall WFL for tasks assessed   Lower Extremity Assessment Lower Extremity Assessment: Defer to PT evaluation   Cervical / Trunk Assessment Cervical / Trunk Assessment: Normal;Other exceptions (R breast mass)   Communication Communication Communication: No difficulties   Cognition Arousal/Alertness: Awake/alert Behavior During Therapy: WFL for tasks assessed/performed Overall Cognitive Status: Within Functional Limits for tasks assessed                                     General Comments  VSS    Exercises     Shoulder Instructions      Home Living Family/patient expects to be  discharged to:: Unsure                                 Additional Comments: Lives with her daughter in her car. Staes she has lived in a car for 2 months; PT note staes she was living in a car for a few days. unsure; recently kicked out of hotel where daughter worked/lived      Prior Functioning/Environment Level of Independence: Independent                 OT Problem List: Decreased knowledge of use of DME or AE      OT Treatment/Interventions:      OT Goals(Current goals can be found in the care plan section) Acute Rehab OT Goals Patient Stated Goal: Very concerned about getting housing OT Goal Formulation: All assessment and education complete, DC therapy  OT Frequency:     Barriers to D/C:            Co-evaluation              AM-PAC OT "6 Clicks" Daily Activity     Outcome Measure Help from another person eating meals?: None Help from another person taking care of personal grooming?: None Help from another person  toileting, which includes using toliet, bedpan, or urinal?: None Help from another person bathing (including washing, rinsing, drying)?: None Help from another person to put on and taking off regular upper body clothing?: None Help from another person to put on and taking off regular lower body clothing?: None 6 Click Score: 24   End of Session Nurse Communication: Mobility status  Activity Tolerance: Patient tolerated treatment well Patient left: in bed;with call bell/phone within reach  OT Visit Diagnosis: Unsteadiness on feet (R26.81)                Time: 1610-9604 OT Time Calculation (min): 21 min Charges:  OT General Charges $OT Visit: 1 Visit OT Evaluation $OT Eval Low Complexity: [place], OT/L   Acute OT Clinical Specialist Acute Rehabilitation Services Pager [phone] Office [phone]   [facility] 04/18/2020, 2:51 PM

## 2020-04-18 NOTE — Progress Notes (Signed)
Dr. Candiss Norse would like to narrow abx to just metronidazole for colitis to complete 7d.  Onnie Boer, PharmD, BCIDP, AAHIVP, CPP Infectious Disease Pharmacist 04/18/2020 10:52 AM

## 2020-04-19 DIAGNOSIS — R55 Syncope and collapse: Secondary | ICD-10-CM | POA: Diagnosis not present

## 2020-04-19 LAB — COMPREHENSIVE METABOLIC PANEL
ALT: 13 U/L (ref 0–44)
AST: 23 U/L (ref 15–41)
Albumin: 2.6 g/dL — ABNORMAL LOW (ref 3.5–5.0)
Alkaline Phosphatase: 41 U/L (ref 38–126)
Anion gap: 8 (ref 5–15)
BUN: 12 mg/dL (ref 8–23)
CO2: 25 mmol/L (ref 22–32)
Calcium: 8.4 mg/dL — ABNORMAL LOW (ref 8.9–10.3)
Chloride: 105 mmol/L (ref 98–111)
Creatinine, Ser: 0.64 mg/dL (ref 0.44–1.00)
GFR, Estimated: >60 mL/min (ref >60)
Glucose, Bld: 127 mg/dL — ABNORMAL HIGH (ref 70–99)
Potassium: 3.7 mmol/L (ref 3.5–5.1)
Sodium: 138 mmol/L (ref 135–145)
Total Bilirubin: 0.4 mg/dL (ref 0.3–1.2)
Total Protein: 5.3 g/dL — ABNORMAL LOW (ref 6.5–8.1)

## 2020-04-19 LAB — CBC WITH DIFFERENTIAL/PLATELET
Abs Immature Granulocytes: 0.03 10*3/uL (ref 0.00–0.07)
Basophils Absolute: 0 10*3/uL (ref 0.0–0.1)
Basophils Relative: 1 %
Eosinophils Absolute: 0.5 10*3/uL (ref 0.0–0.5)
Eosinophils Relative: 8 %
HCT: 31.8 % — ABNORMAL LOW (ref 36.0–46.0)
Hemoglobin: 10.2 g/dL — ABNORMAL LOW (ref 12.0–15.0)
Immature Granulocytes: 1 %
Lymphocytes Relative: 19 %
Lymphs Abs: 1.1 10*3/uL (ref 0.7–4.0)
MCH: 28.8 pg (ref 26.0–34.0)
MCHC: 32.1 g/dL (ref 30.0–36.0)
MCV: 89.8 fL (ref 80.0–100.0)
Monocytes Absolute: 0.6 10*3/uL (ref 0.1–1.0)
Monocytes Relative: 9 %
Neutro Abs: 3.8 10*3/uL (ref 1.7–7.7)
Neutrophils Relative %: 62 %
Platelets: 349 10*3/uL (ref 150–400)
RBC: 3.54 MIL/uL — ABNORMAL LOW (ref 3.87–5.11)
RDW: 13.2 % (ref 11.5–15.5)
WBC: 6 10*3/uL (ref 4.0–10.5)
nRBC: 0.3 % — ABNORMAL HIGH (ref 0.0–0.2)

## 2020-04-19 LAB — C-REACTIVE PROTEIN: CRP: 4.3 mg/dL — ABNORMAL HIGH (ref <1.0)

## 2020-04-19 LAB — CULTURE, BLOOD (ROUTINE X 2)

## 2020-04-19 LAB — MAGNESIUM: Magnesium: 1.8 mg/dL (ref 1.7–2.4)

## 2020-04-19 LAB — D-DIMER, QUANTITATIVE: D-Dimer, Quant: 1.94 ug/mL-FEU — ABNORMAL HIGH (ref 0.00–0.50)

## 2020-04-19 LAB — BRAIN NATRIURETIC PEPTIDE: B Natriuretic Peptide: 48.3 pg/mL (ref 0.0–100.0)

## 2020-04-19 NOTE — Progress Notes (Signed)
PROGRESS NOTE                                                                                                                                                                                                             Patient Demographics:    Elizabeth Chase, is a 80 y.o. female, DOB - Jun 01, 1940, KDX:[id number]  Outpatient Primary MD for the patient is No primary care provider on file.    LOS - 4  Admit date - 04/14/2020    Chief Complaint  Patient presents with  . Breast Infection        Brief Narrative (HPI from H&P)  - Elizabeth Chase is a 80 y.o. female with medical history significant of hypertension and hyperlipidemia who presented episode of syncope where she stood up to walk and almost passed out, was caught by her daughter on her way down, lost consciousness for a few minutes with no bowel or bladder incontinence, in the ER she was found to be dehydrated and hypotensive, on exam she was found to have a fungating right breast mass, imaging suggested colitis along with right breast malignancy and she was admitted to the hospital.   Subjective:   Patient in bed, appears comfortable, denies any headache, no fever, no chest pain or pressure, no shortness of breath , no abdominal pain. No focal weakness.   Assessment  & Plan :     1.  Syncope due to hypotension.  Caused by dehydration and possible mild colitis.  Currently no diarrhea, has been hydrated with IV fluids and is feeling better already, continue hydration, PT OT and monitor.  Head CT is nonacute and exam is nonfocal  2.  Possible colitis.  Currently no diarrhea.  Agree with IV Flagyl and cephalosporin continue.  3.  Fungating right breast mass with right axillary lymphadenopathy.  Highly suspicious for malignancy.  Seen by general surgery and underwent tissue biopsy on 04/18/2020, also seen by oncology who will follow up with her in the office, for now bleeding around  the mass has been stable after cauterization by general surgery.  Wound care to continue taking care per wound care protocol.  4.  Left wrist swelling appears to be a hematoma at the site of IV.  IV removed, stable X-Ray.  Much improved.  5. Fully vaccinated with incidental Covid infection.  Agree with 3 days of remdesivir.  6.  Elevated D-dimer.  Check leg venous ultrasound, holding Lovenox as right breast masses bleeding, transitionrd to heparin and SCDs and monitor closely.  CTA lungs is negative.  7.  Poor social situation.  Homeless.  Social work following.       Condition - Extremely Guarded  Family Communication  :  Daughter bedside  Code Status :  Full  Consults  :  CCS, Onc  PUD Prophylaxis :    Procedures  :     Right breast excision biopsy by general surgery on 04/18/2020.    CT head - Non acute  CT Chest - Abd - Pelvis - No evidence of pulmonary embolism. Large RIGHT breast mass 8.1 cm greatest size most consistent with malignancy with associated RIGHT axillary adenopathy. Additional small RIGHT breast nodule 14 x 7 mm question mass versus intramammary node. COPD. Descending colitis. Sigmoid diverticulosis without evidence of diverticulitis. Infrarenal abdominal aortic aneurysm 4.2 cm in greatest axial dimensions      Disposition Plan  :    Status is: Inpatient  Remains inpatient appropriate because:IV treatments appropriate due to intensity of illness or inability to take PO   Dispo: The patient is from: Home              Anticipated d/c is to: Home              Patient currently is not medically stable to d/c.   Difficult to place patient No  DVT Prophylaxis  :   Heparin - SCDs   Lab Results  Component Value Date   PLT 349 04/19/2020    Diet :  Diet Order            Diet regular Room service appropriate? Yes; Fluid consistency: Thin  Diet effective now                  Inpatient Medications  Scheduled Meds: . vitamin C  500 mg Oral Daily   . atorvastatin  20 mg Oral q1800  . fluticasone furoate-vilanterol  1 puff Inhalation Daily  . heparin injection (subcutaneous)  5,000 Units Subcutaneous Q8H  . lidocaine-EPINEPHrine  20 mL Intradermal Once  . metroNIDAZOLE  500 mg Oral Q8H  . sodium chloride flush  3 mL Intravenous Q12H  . zinc sulfate  220 mg Oral Daily   Continuous Infusions:  PRN Meds:.acetaminophen **OR** [DISCONTINUED] acetaminophen, albuterol, guaiFENesin-dextromethorphan, silver nitrate applicators, traZODone  Antibiotics  :    Anti-infectives (From admission, onward)   Start     Dose/Rate Route Frequency Ordered Stop   04/18/20 1400  metroNIDAZOLE (FLAGYL) tablet 500 mg        500 mg Oral Every 8 hours 04/18/20 1049 04/22/20 0559   04/16/20 1000  remdesivir 100 mg in sodium chloride 0.9 % 100 mL IVPB       "Followed by" Linked Group Details   100 mg 200 mL/hr over 30 Minutes Intravenous Daily 04/15/20 1203 04/17/20 1109   04/15/20 1915  metroNIDAZOLE (FLAGYL) IVPB 500 mg  Status:  Discontinued        500 mg 100 mL/hr over 60 Minutes Intravenous Every 8 hours 04/15/20 1822 04/18/20 1049   04/15/20 1630  ceFAZolin (ANCEF) IVPB 1 g/50 mL premix  Status:  Discontinued        1 g 100 mL/hr over 30 Minutes Intravenous Every 8 hours 04/15/20 1154 04/18/20 1049   04/15/20 1400  remdesivir 200 mg in sodium chloride  0.9% 250 mL IVPB       "Followed by" Linked Group Details   200 mg 580 mL/hr over 30 Minutes Intravenous Once 04/15/20 1203 04/15/20 2101   04/15/20 0745  ceFAZolin (ANCEF) IVPB 1 g/50 mL premix        1 g 100 mL/hr over 30 Minutes Intravenous  Once 04/15/20 0732 04/15/20 0902       Time Spent in minutes  30   Lala Lund M.D on 04/19/2020 at 10:14 AM  To page go to [email or web address]   [facility] -  Office  [phone]    See all Orders from today for further details    Objective:   Vitals:   04/18/20 0502 04/18/20 1335 04/18/20 2017 04/19/20 0451  BP: 130/71 138/63 131/66 (!)  112/58  Pulse: 78 81 84 84  Resp: 20 19 18 20   Temp: 98 F (36.7 C) 98.3 F (36.8 C) 98.2 F (36.8 C) 98.3 F (36.8 C)  TempSrc: Axillary Oral Oral Oral  SpO2: 93% 94% 92% 93%  Weight:      Height:        Wt Readings from Last 3 Encounters:  04/15/20 54.9 kg  03/01/17 (!) 558.7 kg  01/15/17 51.9 kg     Intake/Output Summary (Last 24 hours) at 04/19/2020 1014 Last data filed at 04/19/2020 0836 Gross per 24 hour  Intake 960 ml  Output -  Net 960 ml     Physical Exam  Awake Alert, No new F.N deficits, Normal affect [place].AT,PERRAL Supple Neck,No JVD, No cervical lymphadenopathy appriciated.  Symmetrical Chest wall movement, Good air movement bilaterally, CTAB RRR,No Gallops, Rubs or new Murmurs, No Parasternal Heave +ve B.Sounds, Abd Soft, No tenderness, No organomegaly appriciated, No rebound - guarding or rigidity. No Cyanosis, Clubbing or edema, No new Rash or bruise Right breast exam on 04/16/2020 with 2 female CNA's bedside, fungating mass with toilet paper covering it with blood oozing    Data Review:    CBC Recent Labs  Lab 04/14/20 2259 04/16/20 0431 04/17/20 0423 04/18/20 0114 04/19/20 0010  WBC 7.8 8.1 6.0 6.0 6.0  HGB 12.0 10.4* 9.9* 10.3* 10.2*  HCT 39.3 31.8* 29.9* 32.2* 31.8*  PLT 336 255 279 300 349  MCV 92.7 90.9 88.7 89.4 89.8  MCH 28.3 29.7 29.4 28.6 28.8  MCHC 30.5 32.7 33.1 32.0 32.1  RDW 13.5 13.7 13.6 13.2 13.2  LYMPHSABS 1.0  --  1.0 1.0 1.1  MONOABS 0.4  --  0.5 0.6 0.6  EOSABS 0.1  --  0.3 0.4 0.5  BASOSABS 0.0  --  0.0 0.0 0.0    Recent Labs  Lab 04/14/20 2259 04/15/20 0742 04/15/20 0918 04/15/20 1254 04/15/20 1302 04/16/20 0431 04/17/20 0423 04/18/20 0114 04/19/20 0010  NA 139  --   --   --   --  134* 138 137 138  K 3.6  --   --   --   --  3.9 3.9 3.6 3.7  CL 104  --   --   --   --  107 105 104 105  CO2 23  --   --   --   --  20* 25 25 25   GLUCOSE 212*  --   --   --   --  113* 102* 114* 127*  BUN 18  --   --   --   --   13 11 14 12   CREATININE 1.06*  --   --   --   --  0.79 0.67 0.73 0.64  CALCIUM 9.6  --   --   --   --  7.8* 8.1* 8.2* 8.4*  AST 20  --   --   --   --   --  17 17 23   ALT 14  --   --   --   --   --  9 9 13   ALKPHOS 53  --   --   --   --   --  39 39 41  BILITOT 0.7  --   --   --   --   --  0.7 0.4 0.4  ALBUMIN 3.4*  --   --   --   --   --  2.4* 2.4* 2.6*  MG  --   --   --   --   --   --  1.7 1.7 1.8  CRP  --   --   --   --  4.3*  --  10.5* 7.7* 4.3*  DDIMER  --   --   --   --  3.46*  --  2.55* 2.31* 1.94*  PROCALCITON  --   --   --   --  0.10  --   --   --   --   LATICACIDVEN  --  2.2* 1.7  --   --   --   --   --   --   INR  --  1.1  --   --   --   --   --   --   --   TSH  --   --   --  0.779  --   --   --   --   --   BNP  --   --   --  64.0  --   --  67.9 79.9 48.3    ------------------------------------------------------------------------------------------------------------------ No results for input(s): CHOL, HDL, LDLCALC, TRIG, CHOLHDL, LDLDIRECT in the last 72 hours.  Lab Results  Component Value Date   HGBA1C 5.9 (H) 01/02/2017   ------------------------------------------------------------------------------------------------------------------ No results for input(s): TSH, T4TOTAL, T3FREE, THYROIDAB in the last 72 hours.  Invalid input(s): FREET3  Cardiac Enzymes No results for input(s): CKMB, TROPONINI, MYOGLOBIN in the last 168 hours.  Invalid input(s): CK ------------------------------------------------------------------------------------------------------------------    Component Value Date/Time   BNP 48.3 04/19/2020 0010    Micro Results Recent Results (from the past 240 hour(s))  Culture, blood (routine x 2)     Status: None (Preliminary result)   Collection Time: 04/15/20  7:42 AM   Specimen: BLOOD  Result Value Ref Range Status   Specimen Description BLOOD LEFT ANTECUBITAL  Final   Special Requests   Final    BOTTLES DRAWN AEROBIC AND ANAEROBIC Blood Culture  adequate volume   Culture   Final    NO GROWTH 3 DAYS Performed at [facility], [address], [place], [place] [address]    Report Status PENDING  Incomplete  Resp Panel by RT-PCR (Flu A&B, Covid) Nasopharyngeal Swab     Status: Abnormal   Collection Time: 04/15/20  7:42 AM   Specimen: Nasopharyngeal Swab; Nasopharyngeal(NP) swabs in vial transport medium  Result Value Ref Range Status   SARS Coronavirus 2 by RT PCR POSITIVE (A) NEGATIVE Final    Comment: RESULT CALLED TO, READ BACK BY AND VERIFIED WITH: RN S.BETRAND ON 04/15/2020 AT 0926 BY E.PARRISH (NOTE) SARS-CoV-2 target nucleic acids are DETECTED.  The SARS-CoV-2 RNA is generally detectable in  upper respiratory specimens during the acute phase of infection. Positive results are indicative of the presence of the identified virus, but do not rule out bacterial infection or co-infection with other pathogens not detected by the test. Clinical correlation with patient history and other diagnostic information is necessary to determine patient infection status. The expected result is Negative.  Fact Sheet for Patients: EntrepreneurPulse.com.au  Fact Sheet for Healthcare Providers: IncredibleEmployment.be  This test is not yet approved or cleared by the [place] FDA and  has been authorized for detection and/or diagnosis of SARS-CoV-2 by FDA under an Emergency Use Authorization (EUA).  This EUA will remain in effect (meaning this  test can be used) for the duration of  the COVID-19 declaration under Section 564(b)(1) of the Act, 21 U.S.C. section 360bbb-3(b)(1), unless the authorization is terminated or revoked sooner.     Influenza A by PCR NEGATIVE NEGATIVE Final   Influenza B by PCR NEGATIVE NEGATIVE Final    Comment: (NOTE) The Xpert Xpress SARS-CoV-2/FLU/RSV plus assay is intended as an aid in the diagnosis of influenza from Nasopharyngeal swab specimens and should not be  used as a sole basis for treatment. Nasal washings and aspirates are unacceptable for Xpert Xpress SARS-CoV-2/FLU/RSV testing.  Fact Sheet for Patients: EntrepreneurPulse.com.au  Fact Sheet for Healthcare Providers: IncredibleEmployment.be  This test is not yet approved or cleared by the [place] FDA and has been authorized for detection and/or diagnosis of SARS-CoV-2 by FDA under an Emergency Use Authorization (EUA). This EUA will remain in effect (meaning this test can be used) for the duration of the COVID-19 declaration under Section 564(b)(1) of the Act, 21 U.S.C. section 360bbb-3(b)(1), unless the authorization is terminated or revoked.  Performed at [facility], [place] [address], [place], [place] [address]   Culture, blood (routine x 2)     Status: None (Preliminary result)   Collection Time: 04/15/20  7:43 AM   Specimen: BLOOD RIGHT HAND  Result Value Ref Range Status   Specimen Description BLOOD RIGHT HAND  Final   Special Requests   Final    BOTTLES DRAWN AEROBIC ONLY Blood Culture results may not be optimal due to an inadequate volume of blood received in culture bottles   Culture   Final    NO GROWTH 3 DAYS Performed at [facility], [place] [address], [place], [place] [address]    Report Status PENDING  Incomplete    Radiology Reports DG Wrist Complete Left  Result Date: 04/16/2020 CLINICAL DATA:  Left wrist swelling. EXAM: LEFT WRIST - COMPLETE 3+ VIEW COMPARISON:  None. FINDINGS: Degenerative changes between the base of the first metacarpal and trapezium. Degenerative changes between the trapezium and scaphoid. Soft tissue swelling best seen on the lateral view. No fractures or bony erosions identified. IMPRESSION: Soft tissue swelling.  Degenerative changes as above. Electronically Signed   By: Dorise Bullion III M.D   On: 04/16/2020 12:33   CT Head Wo Contrast  Result Date: 04/15/2020 CLINICAL DATA:  Syncope  with normal neuro exam EXAM: CT HEAD WITHOUT CONTRAST TECHNIQUE: Contiguous axial images were obtained from the base of the skull through the vertex without intravenous contrast. COMPARISON:  Brain MRI 01/02/2017 FINDINGS: Brain: No evidence of acute infarction, hemorrhage, hydrocephalus, extra-axial collection or mass effect. 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 Stable calcification in the right posterior frontal white matter age congruent cerebral volume loss. Known meningioma at the right vertex again measuring 9 mm.  Vascular: No hyperdense vessel or unexpected calcification. Skull: Normal. Negative for fracture or focal lesion. Sinuses/Orbits: No acute finding. IMPRESSION: No acute finding or change from 2018 Electronically Signed   By: Monte Fantasia M.D.   On: 04/15/2020 08:42   CT Angio Chest PE W and/or Wo Contrast  Result Date: 04/15/2020 CLINICAL DATA:  Abdominal pain, nonlocalized, weakness, fatigue, chronic RIGHT breast infection for years drainage EXAM: CT ANGIOGRAPHY CHEST CT ABDOMEN AND PELVIS WITH CONTRAST TECHNIQUE: Multidetector CT imaging of the chest was performed using the standard protocol during bolus administration of intravenous contrast. Multiplanar CT image reconstructions and MIPs were obtained to evaluate the vascular anatomy. Multidetector CT imaging of the abdomen and pelvis was performed using the standard protocol during bolus administration of intravenous contrast. CONTRAST:  164mL OMNIPAQUE IOHEXOL 350 MG/ML SOLN IV. No oral contrast. COMPARISON:  None FINDINGS: CTA CHEST FINDINGS Cardiovascular: Atherosclerotic calcifications aorta, coronary arteries, and proximal great vessels. Aorta normal caliber without aneurysm or dissection. Heart unremarkable. No pericardial effusion. Pulmonary arteries adequately opacified and patent. No evidence of pulmonary embolism. Mediastinum/Nodes: Base of cervical region normal appearance. Esophagus  unremarkable. Large lobulated soft tissue mass identified at RIGHT breast extending through skin surface, 8.1 x 4.9 x 7.1 cm in size most consistent with malignancy rather than infection. Mass extends to the chest wall with loss of fat plane between the mass and the underlying pectoralis muscle. Additional small nodule within RIGHT breast 14 x 7 mm question second mass versus lymph node. RIGHT axillary adenopathy with nodes measuring up to 18 mm short axis. Normal sized mediastinal lymph nodes. Lungs/Pleura: Emphysematous changes. Dependent atelectasis. Lungs otherwise clear. No pulmonary infiltrate, pleural effusion, or pneumothorax. No pulmonary mass/nodule. Musculoskeletal: No acute osseous findings. Review of the MIP images confirms the above findings. CT ABDOMEN and PELVIS FINDINGS Hepatobiliary: Gallbladder and liver normal appearance Pancreas: Atrophic without focal mass Spleen: Normal appearance Adrenals/Urinary Tract: Cortical scarring at inferior pole LEFT kidney. Adrenal glands, kidneys, ureters, and bladder otherwise normal appearance Stomach/Bowel: Appendix not visualized, no pericecal inflammatory process seen. Stomach decompressed. Bowel wall thickening of descending colon with mild surrounding infiltrative changes over a long length favoring descending colitis. Minimal thickening of LEFT lateral conal fascia. Sigmoid diverticulosis without evidence of diverticulitis. Remaining bowel loops unremarkable. Vascular/Lymphatic: Atherosclerotic calcifications aorta, iliac arteries, visceral arteries. Infrarenal abdominal aortic aneurysm 4.2 x 4.0 cm in greatest axial dimensions extending 6.6 cm length, terminating above bifurcation. Moderate thrombus. No perianeurysmal infiltration/hemorrhage. No adenopathy. Reproductive: Atrophic uterus with unremarkable adnexa Other: No free air or free fluid.  No hernia. Musculoskeletal: Osseous demineralization. Review of the MIP images confirms the above findings.  IMPRESSION: No evidence of pulmonary embolism. Large RIGHT breast mass 8.1 cm greatest size most consistent with malignancy with associated RIGHT axillary adenopathy. Additional small RIGHT breast nodule 14 x 7 mm question mass versus intramammary node. COPD. Descending colitis. Sigmoid diverticulosis without evidence of diverticulitis. Infrarenal abdominal aortic aneurysm 4.2 cm in greatest axial dimensions; Recommend follow-up every 12 months and vascular consultation. This recommendation follows ACR consensus guidelines: White Paper of the ACR Incidental Findings Committee II on Vascular Findings. J Am Coll Radiol 2013; 10:789-794. Emphysema (ICD10-J43.9). Aortic Atherosclerosis (ICD10-I70.0). Aortic aneurysm NOS (ICD10-I71.9).: Findings called to Dr.  Francia Greaves on 04/15/2020 at 0857 hours. Electronically Signed   By: Lavonia Dana M.D.   On: 04/15/2020 08:58   CT ABDOMEN PELVIS W CONTRAST  Result Date: 04/15/2020 CLINICAL DATA:  Abdominal pain, nonlocalized, weakness, fatigue, chronic RIGHT breast infection for years drainage  EXAM: CT ANGIOGRAPHY CHEST CT ABDOMEN AND PELVIS WITH CONTRAST TECHNIQUE: Multidetector CT imaging of the chest was performed using the standard protocol during bolus administration of intravenous contrast. Multiplanar CT image reconstructions and MIPs were obtained to evaluate the vascular anatomy. Multidetector CT imaging of the abdomen and pelvis was performed using the standard protocol during bolus administration of intravenous contrast. CONTRAST:  176mL OMNIPAQUE IOHEXOL 350 MG/ML SOLN IV. No oral contrast. COMPARISON:  None FINDINGS: CTA CHEST FINDINGS Cardiovascular: Atherosclerotic calcifications aorta, coronary arteries, and proximal great vessels. Aorta normal caliber without aneurysm or dissection. Heart unremarkable. No pericardial effusion. Pulmonary arteries adequately opacified and patent. No evidence of pulmonary embolism. Mediastinum/Nodes: Base of cervical region normal  appearance. Esophagus unremarkable. Large lobulated soft tissue mass identified at RIGHT breast extending through skin surface, 8.1 x 4.9 x 7.1 cm in size most consistent with malignancy rather than infection. Mass extends to the chest wall with loss of fat plane between the mass and the underlying pectoralis muscle. Additional small nodule within RIGHT breast 14 x 7 mm question second mass versus lymph node. RIGHT axillary adenopathy with nodes measuring up to 18 mm short axis. Normal sized mediastinal lymph nodes. Lungs/Pleura: Emphysematous changes. Dependent atelectasis. Lungs otherwise clear. No pulmonary infiltrate, pleural effusion, or pneumothorax. No pulmonary mass/nodule. Musculoskeletal: No acute osseous findings. Review of the MIP images confirms the above findings. CT ABDOMEN and PELVIS FINDINGS Hepatobiliary: Gallbladder and liver normal appearance Pancreas: Atrophic without focal mass Spleen: Normal appearance Adrenals/Urinary Tract: Cortical scarring at inferior pole LEFT kidney. Adrenal glands, kidneys, ureters, and bladder otherwise normal appearance Stomach/Bowel: Appendix not visualized, no pericecal inflammatory process seen. Stomach decompressed. Bowel wall thickening of descending colon with mild surrounding infiltrative changes over a long length favoring descending colitis. Minimal thickening of LEFT lateral conal fascia. Sigmoid diverticulosis without evidence of diverticulitis. Remaining bowel loops unremarkable. Vascular/Lymphatic: Atherosclerotic calcifications aorta, iliac arteries, visceral arteries. Infrarenal abdominal aortic aneurysm 4.2 x 4.0 cm in greatest axial dimensions extending 6.6 cm length, terminating above bifurcation. Moderate thrombus. No perianeurysmal infiltration/hemorrhage. No adenopathy. Reproductive: Atrophic uterus with unremarkable adnexa Other: No free air or free fluid.  No hernia. Musculoskeletal: Osseous demineralization. Review of the MIP images confirms  the above findings. IMPRESSION: No evidence of pulmonary embolism. Large RIGHT breast mass 8.1 cm greatest size most consistent with malignancy with associated RIGHT axillary adenopathy. Additional small RIGHT breast nodule 14 x 7 mm question mass versus intramammary node. COPD. Descending colitis. Sigmoid diverticulosis without evidence of diverticulitis. Infrarenal abdominal aortic aneurysm 4.2 cm in greatest axial dimensions; Recommend follow-up every 12 months and vascular consultation. This recommendation follows ACR consensus guidelines: White Paper of the ACR Incidental Findings Committee II on Vascular Findings. J Am Coll Radiol 2013; 10:789-794. Emphysema (ICD10-J43.9). Aortic Atherosclerosis (ICD10-I70.0). Aortic aneurysm NOS (ICD10-I71.9).: Findings called to Dr.  Francia Greaves on 04/15/2020 at 0857 hours. Electronically Signed   By: Lavonia Dana M.D.   On: 04/15/2020 08:58   DG Chest Port 1 View  Result Date: 04/15/2020 CLINICAL DATA:  Recent syncopal episode EXAM: PORTABLE CHEST 1 VIEW COMPARISON:  None. FINDINGS: Cardiac shadows within normal limits. Aortic calcifications are noted. Lungs are hyperinflated bilaterally. Rounded density is noted in the right base measuring proximally 6 cm. This may represent fluid within the major fissure although the possibility of underlying mass deserves consideration. CT of the chest with contrast is recommended for further evaluation. IMPRESSION: Rounded density in the right lung base suspicious for underlying mass. CT is recommended for further evaluation.  Electronically Signed   By: Inez Catalina M.D.   On: 04/15/2020 07:36   VAS [place] LOWER EXTREMITY VENOUS (DVT)  Result Date: 04/16/2020  Lower Venous DVT Study Indications: Rapidly rising D-dimer & Covid+.  Comparison Study: No previous exams Performing Technologist: Rogelia Rohrer  Examination Guidelines: A complete evaluation includes B-mode imaging, spectral Doppler, color Doppler, and power Doppler as needed of all  accessible portions of each vessel. Bilateral testing is considered an integral part of a complete examination. Limited examinations for reoccurring indications may be performed as noted. The reflux portion of the exam is performed with the patient in reverse Trendelenburg.  +---------+---------------+---------+-----------+----------+--------------+ RIGHT    CompressibilityPhasicitySpontaneityPropertiesThrombus Aging +---------+---------------+---------+-----------+----------+--------------+ CFV      Full           Yes      Yes                                 +---------+---------------+---------+-----------+----------+--------------+ SFJ      Full                                                        +---------+---------------+---------+-----------+----------+--------------+ FV Prox  Full           Yes      Yes                                 +---------+---------------+---------+-----------+----------+--------------+ FV Mid   Full           Yes      Yes                                 +---------+---------------+---------+-----------+----------+--------------+ FV DistalFull           Yes      Yes                                 +---------+---------------+---------+-----------+----------+--------------+ PFV      Full                                                        +---------+---------------+---------+-----------+----------+--------------+ POP      Full           Yes      Yes                                 +---------+---------------+---------+-----------+----------+--------------+ PTV      Full                                                        +---------+---------------+---------+-----------+----------+--------------+ PERO     Full                                                        +---------+---------------+---------+-----------+----------+--------------+   +---------+---------------+---------+-----------+----------+--------------+  LEFT     CompressibilityPhasicitySpontaneityPropertiesThrombus Aging +---------+---------------+---------+-----------+----------+--------------+ CFV      Full                                                        +---------+---------------+---------+-----------+----------+--------------+ SFJ      Full                                                        +---------+---------------+---------+-----------+----------+--------------+ FV Prox  Full           Yes      Yes                                 +---------+---------------+---------+-----------+----------+--------------+ FV Mid   Full           Yes      Yes                                 +---------+---------------+---------+-----------+----------+--------------+ FV DistalFull           Yes      Yes                                 +---------+---------------+---------+-----------+----------+--------------+ PFV      Full                                                        +---------+---------------+---------+-----------+----------+--------------+ POP      Full           Yes      Yes                                 +---------+---------------+---------+-----------+----------+--------------+ PTV      Full                                                        +---------+---------------+---------+-----------+----------+--------------+ PERO     Full                                                        +---------+---------------+---------+-----------+----------+--------------+     Summary: BILATERAL: - No evidence of deep vein thrombosis seen in the lower extremities, bilaterally. - No evidence of superficial venous thrombosis in the lower extremities, bilaterally. -No evidence of popliteal cyst, bilaterally.   *See table(s) above for measurements and observations. Electronically signed by Jamelle Haring on 04/16/2020 at 5:48:36 PM.    Final

## 2020-04-19 NOTE — Care Management Important Message (Signed)
Important Message  Patient Details  Name: Elizabeth Chase MRN: [id number] Date of Birth: [birth year]   Medicare Important Message Given:  Yes - Important Message mailed due to current National Emergency  Verbal consent obtained due to current National Emergency  Relationship to patient: Self Contact Name: Jadelynn Call Date: 04/19/20  Time: 1508 Phone: [phone] Outcome: Spoke with contact Important Message mailed to: Patient address on file    [name] 04/19/2020, 3:08 PM
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
## 2020-04-19 NOTE — Progress Notes (Signed)
Subjective: CC: Patient reports no pain over her right breast after bx. She has original pressure dressing on that did not saturate through. No tachycardia overnight.   Objective: Vital signs in last 24 hours: Temp:  [98.2 F (36.8 C)-98.3 F (36.8 C)] 98.3 F (36.8 C) (03/08 0451) Pulse Rate:  [81-84] 84 (03/08 0451) Resp:  [18-20] 20 (03/08 0451) BP: (112-138)/(58-66) 112/58 (03/08 0451) SpO2:  [92 %-94 %] 93 % (03/08 0451)    Intake/Output from previous day: 03/07 0701 - 03/08 0700 In: 1210 [P.O.:960; IV Piggyback:250] Out: 1 [Urine:1] Intake/Output this shift: Total I/O In: 240 [P.O.:240] Out: -   PE: Gen: Awake and alert, NAD Lungs: Normal rate and effort Heart: Reg rate Right breast: See picture below. Right breast mass as noted below. Area of bx has been silver nitrated and there is no active bleeding from bx site. Lower portion of mass with scant bleeding after dressing was removed that ceased after application of direct pressure.      Lab Results:  Recent Labs    04/18/20 0114 04/19/20 0010  WBC 6.0 6.0  HGB 10.3* 10.2*  HCT 32.2* 31.8*  PLT 300 349   BMET Recent Labs    04/18/20 0114 04/19/20 0010  NA 137 138  K 3.6 3.7  CL 104 105  CO2 25 25  GLUCOSE 114* 127*  BUN 14 12  CREATININE 0.73 0.64  CALCIUM 8.2* 8.4*   PT/INR No results for input(s): LABPROT, INR in the last 72 hours. CMP     Component Value Date/Time   NA 138 04/19/2020 0010   K 3.7 04/19/2020 0010   CL 105 04/19/2020 0010   CO2 25 04/19/2020 0010   GLUCOSE 127 (H) 04/19/2020 0010   BUN 12 04/19/2020 0010   CREATININE 0.64 04/19/2020 0010   CALCIUM 8.4 (L) 04/19/2020 0010   PROT 5.3 (L) 04/19/2020 0010   ALBUMIN 2.6 (L) 04/19/2020 0010   AST 23 04/19/2020 0010   ALT 13 04/19/2020 0010   ALKPHOS 41 04/19/2020 0010   BILITOT 0.4 04/19/2020 0010   GFRNONAA >60 04/19/2020 0010   GFRAA >60 01/02/2017 0228   Lipase  No results found for:  LIPASE     Studies/Results: No results found.  Anti-infectives: Anti-infectives (From admission, onward)   Start     Dose/Rate Route Frequency Ordered Stop   04/18/20 1400  metroNIDAZOLE (FLAGYL) tablet 500 mg        500 mg Oral Every 8 hours 04/18/20 1049 04/22/20 0559   04/16/20 1000  remdesivir 100 mg in sodium chloride 0.9 % 100 mL IVPB       "Followed by" Linked Group Details   100 mg 200 mL/hr over 30 Minutes Intravenous Daily 04/15/20 1203 04/17/20 1109   04/15/20 1915  metroNIDAZOLE (FLAGYL) IVPB 500 mg  Status:  Discontinued        500 mg 100 mL/hr over 60 Minutes Intravenous Every 8 hours 04/15/20 1822 04/18/20 1049   04/15/20 1630  ceFAZolin (ANCEF) IVPB 1 g/50 mL premix  Status:  Discontinued        1 g 100 mL/hr over 30 Minutes Intravenous Every 8 hours 04/15/20 1154 04/18/20 1049   04/15/20 1400  remdesivir 200 mg in sodium chloride 0.9% 250 mL IVPB       "Followed by" Linked Group Details   200 mg 580 mL/hr over 30 Minutes Intravenous Once 04/15/20 1203 04/15/20 2101   04/15/20 0745  ceFAZolin (ANCEF) IVPB  1 g/50 mL premix        1 g 100 mL/hr over 30 Minutes Intravenous  Once 04/15/20 0732 04/15/20 0902       Assessment/Plan HTN HLD COVID+ Homeless Syncope  Fungating right breast mass - s/p bedside bx by Dr. Ninfa Linden on 3/7. Bc was placed in formalin and directly handed off to patholog with pathology order sheet. Await results - Oncology following and are awaiting the biopsy to confirm the diagnosis and determine the exact type of breast cancer before they can recommend treatment options - TOC following to assist with resources/housing  - Change dressing daily or as needed for saturation. Keep breast binder in place. - Our team will sign off at this time. Please call back with any questions or concerns.    LOS: 4 days    Jillyn Ledger , [facility] Surgery 04/19/2020, 10:28 AM Please see Amion for pager number during day hours  7:00am-4:30pm

## 2020-04-20 DIAGNOSIS — R55 Syncope and collapse: Secondary | ICD-10-CM | POA: Diagnosis not present

## 2020-04-20 DIAGNOSIS — N63 Unspecified lump in unspecified breast: Secondary | ICD-10-CM

## 2020-04-20 LAB — CBC WITH DIFFERENTIAL/PLATELET
Abs Immature Granulocytes: 0.02 10*3/uL (ref 0.00–0.07)
Basophils Absolute: 0.1 10*3/uL (ref 0.0–0.1)
Basophils Relative: 1 %
Eosinophils Absolute: 0.4 10*3/uL (ref 0.0–0.5)
Eosinophils Relative: 6 %
HCT: 31.2 % — ABNORMAL LOW (ref 36.0–46.0)
Hemoglobin: 10.5 g/dL — ABNORMAL LOW (ref 12.0–15.0)
Immature Granulocytes: 0 %
Lymphocytes Relative: 21 %
Lymphs Abs: 1.3 10*3/uL (ref 0.7–4.0)
MCH: 29.6 pg (ref 26.0–34.0)
MCHC: 33.7 g/dL (ref 30.0–36.0)
MCV: 87.9 fL (ref 80.0–100.0)
Monocytes Absolute: 0.6 10*3/uL (ref 0.1–1.0)
Monocytes Relative: 9 %
Neutro Abs: 3.9 10*3/uL (ref 1.7–7.7)
Neutrophils Relative %: 63 %
Platelets: 368 10*3/uL (ref 150–400)
RBC: 3.55 MIL/uL — ABNORMAL LOW (ref 3.87–5.11)
RDW: 13.3 % (ref 11.5–15.5)
WBC: 6.3 10*3/uL (ref 4.0–10.5)
nRBC: 0 % (ref 0.0–0.2)

## 2020-04-20 LAB — CULTURE, BLOOD (ROUTINE X 2)
Culture: NO GROWTH
Culture: NO GROWTH
Special Requests: ADEQUATE

## 2020-04-20 LAB — COMPREHENSIVE METABOLIC PANEL
ALT: 11 U/L (ref 0–44)
AST: 24 U/L (ref 15–41)
Albumin: 2.7 g/dL — ABNORMAL LOW (ref 3.5–5.0)
Alkaline Phosphatase: 43 U/L (ref 38–126)
Anion gap: 11 (ref 5–15)
BUN: 13 mg/dL (ref 8–23)
CO2: 21 mmol/L — ABNORMAL LOW (ref 22–32)
Calcium: 8.4 mg/dL — ABNORMAL LOW (ref 8.9–10.3)
Chloride: 106 mmol/L (ref 98–111)
Creatinine, Ser: 0.75 mg/dL (ref 0.44–1.00)
GFR, Estimated: >60 mL/min (ref >60)
Glucose, Bld: 110 mg/dL — ABNORMAL HIGH (ref 70–99)
Potassium: 4.2 mmol/L (ref 3.5–5.1)
Sodium: 138 mmol/L (ref 135–145)
Total Bilirubin: 0.5 mg/dL (ref 0.3–1.2)
Total Protein: 5.6 g/dL — ABNORMAL LOW (ref 6.5–8.1)

## 2020-04-20 LAB — D-DIMER, QUANTITATIVE: D-Dimer, Quant: 1.85 ug/mL-FEU — ABNORMAL HIGH (ref 0.00–0.50)

## 2020-04-20 LAB — C-REACTIVE PROTEIN: CRP: 2.2 mg/dL — ABNORMAL HIGH (ref <1.0)

## 2020-04-20 LAB — BRAIN NATRIURETIC PEPTIDE: B Natriuretic Peptide: 35.4 pg/mL (ref 0.0–100.0)

## 2020-04-20 LAB — MAGNESIUM: Magnesium: 1.8 mg/dL (ref 1.7–2.4)

## 2020-04-20 NOTE — Progress Notes (Signed)
Physical Therapy Treatment Patient Details Name: Elizabeth Chase MRN: [id number] DOB: [birth year] Today's Date: 04/20/2020    History of Present Illness SANJNA HASKEW is a 80 y.o. female with medical history significant of hypertension and hyperlipidemia who presented episode of syncope where she stood up to walk and almost passed out, was caught by her daughter on her way down, lost consciousness for a few minutes with no bowel or bladder incontinence, in the ER she was found to be dehydrated and hypotensive, on exam she was found to have a fungating right breast mass, imaging suggested colitis along with right breast malignancy and she was admitted to the hospital; covid +    PT Comments    Pt eager to get up and out of room with therapy.  Pt is independent with with bed mobility, supervision for transfers and ambulation of 500 feet and for up and over 1 step 5x with use of foot of the bed as rail. Pt reports feeling better after getting up and moving around. CM/SW continues to work on possible housing options at discharge. PT will continue to follow acutely to assist in reaching her mobility goals.    Follow Up Recommendations  No PT follow up;Supervision - Intermittent;Other (comment) (Will need support in the pursuit of her cancer care)     Equipment Recommendations  None recommended by PT       Precautions / Restrictions Precautions Precautions: Other (comment) Precaution Comments: Covd    Mobility  Bed Mobility Overal bed mobility: Needs Assistance;Independent                  Transfers Overall transfer level: Modified independent                  Ambulation/Gait Ambulation/Gait assistance: Supervision Gait Distance (Feet): 500 Feet Assistive device: None Gait Pattern/deviations: Step-through pattern Gait velocity: slowed Gait velocity interpretation: <1.8 ft/sec, indicate of risk for recurrent falls General Gait Details: strong, steady gait, decreasing speed  with distance and 2/4 DoE by the end of ambulation   Stairs Stairs: Yes Stairs assistance: Supervision Stair Management: One rail Left;Step to pattern Number of Stairs: 1 (1 step up and over 5x each way)         Balance Overall balance assessment: Mild deficits observed, not formally tested                                          Cognition Arousal/Alertness: Awake/alert Behavior During Therapy: WFL for tasks assessed/performed Overall Cognitive Status: Within Functional Limits for tasks assessed                                           General Comments General comments (skin integrity, edema, etc.): VSS      Pertinent Vitals/Pain Pain Assessment: No/denies pain Faces Pain Scale: No hurt           PT Goals (current goals can now be found in the care plan section) Acute Rehab PT Goals Patient Stated Goal: Very concerned about getting housing PT Goal Formulation: With patient Time For Goal Achievement: 05/01/20 Potential to Achieve Goals: Good    Frequency    Min 3X/week (will likely meet goals in next session)      PT Plan Current plan remains  appropriate       AM-PAC PT "6 Clicks" Mobility   Outcome Measure  Help needed turning from your back to your side while in a flat bed without using bedrails?: None Help needed moving from lying on your back to sitting on the side of a flat bed without using bedrails?: None Help needed moving to and from a bed to a chair (including a wheelchair)?: None Help needed standing up from a chair using your arms (e.g., wheelchair or bedside chair)?: None Help needed to walk in hospital room?: None Help needed climbing 3-5 steps with a railing? : None 6 Click Score: 24    End of Session   Activity Tolerance: Patient tolerated treatment well Patient left: in bed;with call bell/phone within reach Nurse Communication: Mobility status PT Visit Diagnosis: Muscle weakness (generalized)  (M62.81)     Time: 1352-1410 PT Time Calculation (min) (ACUTE ONLY): 18 min  Charges:  $Gait Training: 8-22 mins                     Randen Kauth B. Migdalia Dk PT, DPT Acute Rehabilitation Services Pager [phone] Office [phone]    [place] 04/20/2020, 6:56 PM

## 2020-04-20 NOTE — Progress Notes (Signed)
PROGRESS NOTE                                                                                                                                                                                                             Patient Demographics:    Elizabeth Chase, is a 80 y.o. female, DOB - 11-12-1940, VEL:[id number]  Outpatient Primary MD for the patient is No primary care provider on file.    LOS - 5  Admit date - 04/14/2020    Chief Complaint  Patient presents with  . Breast Infection        Brief Narrative (HPI from H&P)  - Elizabeth Chase is a 80 y.o. female with medical history significant of hypertension and hyperlipidemia who presented episode of syncope where she stood up to walk and almost passed out, was caught by her daughter on her way down, lost consciousness for a few minutes with no bowel or bladder incontinence, in the ER she was found to be dehydrated and hypotensive, on exam she was found to have a fungating right breast mass, imaging suggested colitis along with right breast malignancy and she was admitted to the hospital.   Subjective:   Patient in bed, appears comfortable, denies any headache, no fever, no chest pain or pressure, no shortness of breath , no abdominal pain. No focal weakness.   Assessment  & Plan :   Syncope due to hypotension.  - Caused by dehydration and possible mild colitis.  Currently no diarrhea, has been hydrated with IV fluids and is feeling better already, continue hydration, PT OT and monitor.  Head CT is nonacute and exam is nonfocal  Possible colitis.   -Currently no diarrhea.  Agree with IV Flagyl and cephalosporin continue.  Breast cancer with fungating right breast mass with right axillary lymphadenopathy.  -History significant for invasive ductal carcinoma. -And oncology input greatly appreciated, awaiting further biopsy results as discussed with oncology(receptors), and likely  will need chemo, then resection followed by radiation.  Left wrist swelling appears to be a hematoma at the site of IV.  IV removed, stable X-Ray.  Much improved.  Fully vaccinated with incidental Covid infection.   - 3 days of remdesivir.  Elevated D-dimer.  - CTA lungs is negative.  Venous Dopplers is negative for DVT.   Poor social situation.  Homeless.  Social work following.       Condition - Extremely Guarded  Family Communication  :  None at  bedside  Code Status :  Full  Consults  :  CCS, Onc  PUD Prophylaxis :    Procedures  :     Right breast excision biopsy by general surgery on 04/18/2020.    CT head - Non acute  CT Chest - Abd - Pelvis - No evidence of pulmonary embolism. Large RIGHT breast mass 8.1 cm greatest size most consistent with malignancy with associated RIGHT axillary adenopathy. Additional small RIGHT breast nodule 14 x 7 mm question mass versus intramammary node. COPD. Descending colitis. Sigmoid diverticulosis without evidence of diverticulitis. Infrarenal abdominal aortic aneurysm 4.2 cm in greatest axial dimensions      Disposition Plan  :    Status is: Inpatient  Remains inpatient appropriate because:IV treatments appropriate due to intensity of illness or inability to take PO   Dispo: The patient is from: homeless              Anticipated d/c is to: Patient is homeless              Patient currently is not medically stable to d/c.   Difficult to place patient No  DVT Prophylaxis  :   Heparin - SCDs   Lab Results  Component Value Date   PLT 368 04/20/2020    Diet :  Diet Order            Diet regular Room service appropriate? Yes; Fluid consistency: Thin  Diet effective now                  Inpatient Medications  Scheduled Meds: . vitamin C  500 mg Oral Daily  . atorvastatin  20 mg Oral q1800  . fluticasone furoate-vilanterol  1 puff Inhalation Daily  . heparin injection (subcutaneous)  5,000 Units Subcutaneous Q8H  .  lidocaine-EPINEPHrine  20 mL Intradermal Once  . metroNIDAZOLE  500 mg Oral Q8H  . sodium chloride flush  3 mL Intravenous Q12H  . zinc sulfate  220 mg Oral Daily   Continuous Infusions:  PRN Meds:.acetaminophen **OR** [DISCONTINUED] acetaminophen, albuterol, guaiFENesin-dextromethorphan, silver nitrate applicators, traZODone  Antibiotics  :    Anti-infectives (From admission, onward)   Start     Dose/Rate Route Frequency Ordered Stop   04/18/20 1400  metroNIDAZOLE (FLAGYL) tablet 500 mg        500 mg Oral Every 8 hours 04/18/20 1049 04/22/20 0559   04/16/20 1000  remdesivir 100 mg in sodium chloride 0.9 % 100 mL IVPB       "Followed by" Linked Group Details   100 mg 200 mL/hr over 30 Minutes Intravenous Daily 04/15/20 1203 04/17/20 1109   04/15/20 1915  metroNIDAZOLE (FLAGYL) IVPB 500 mg  Status:  Discontinued        500 mg 100 mL/hr over 60 Minutes Intravenous Every 8 hours 04/15/20 1822 04/18/20 1049   04/15/20 1630  ceFAZolin (ANCEF) IVPB 1 g/50 mL premix  Status:  Discontinued        1 g 100 mL/hr over 30 Minutes Intravenous Every 8 hours 04/15/20 1154 04/18/20 1049   04/15/20 1400  remdesivir 200 mg in sodium chloride 0.9% 250 mL IVPB       "Followed by" Linked Group Details   200 mg 580 mL/hr over 30 Minutes Intravenous Once 04/15/20 1203 04/15/20 2101   04/15/20 0745  ceFAZolin (ANCEF) IVPB 1 g/50 mL  premix        1 g 100 mL/hr over 30 Minutes Intravenous  Once 04/15/20 0732 04/15/20 0902       Time Spent in minutes  30   Phillips Climes M.D on 04/20/2020 at 11:31 AM  To page go to [email or web address]   [facility] -  Office  [phone]    See all Orders from today for further details    Objective:   Vitals:   04/19/20 0451 04/19/20 1419 04/19/20 2059 04/20/20 0508  BP: (!) 112/58 125/70 (!) 143/65 (!) 120/59  Pulse: 84 79 77 71  Resp: 20 17 20 18   Temp: 98.3 F (36.8 C) 98.1 F (36.7 C) 98.5 F (36.9 C) 98 F (36.7 C)  TempSrc: Oral Oral Oral    SpO2: 93% 94% 94% 92%  Weight:      Height:        Wt Readings from Last 3 Encounters:  04/15/20 54.9 kg  03/01/17 (!) 558.7 kg  01/15/17 51.9 kg     Intake/Output Summary (Last 24 hours) at 04/20/2020 1131 Last data filed at 04/20/2020 0525 Gross per 24 hour  Intake 720 ml  Output --  Net 720 ml     Physical Exam  Awake Alert, Oriented X 3, frail, no new F.N deficits, Normal affect Symmetrical Chest wall movement, Good air movement bilaterally, CTAB RRR,No Gallops,Rubs or new Murmurs, No Parasternal Heave +ve B.Sounds, Abd Soft, No tenderness, No rebound - guarding or rigidity. No Cyanosis, Clubbing or edema, No new Rash or bruise      Data Review:    CBC Recent Labs  Lab 04/14/20 2259 04/16/20 0431 04/17/20 0423 04/18/20 0114 04/19/20 0010 04/20/20 0049  WBC 7.8 8.1 6.0 6.0 6.0 6.3  HGB 12.0 10.4* 9.9* 10.3* 10.2* 10.5*  HCT 39.3 31.8* 29.9* 32.2* 31.8* 31.2*  PLT 336 255 279 300 349 368  MCV 92.7 90.9 88.7 89.4 89.8 87.9  MCH 28.3 29.7 29.4 28.6 28.8 29.6  MCHC 30.5 32.7 33.1 32.0 32.1 33.7  RDW 13.5 13.7 13.6 13.2 13.2 13.3  LYMPHSABS 1.0  --  1.0 1.0 1.1 1.3  MONOABS 0.4  --  0.5 0.6 0.6 0.6  EOSABS 0.1  --  0.3 0.4 0.5 0.4  BASOSABS 0.0  --  0.0 0.0 0.0 0.1    Recent Labs  Lab 04/14/20 2259 04/15/20 0742 04/15/20 0918 04/15/20 1254 04/15/20 1302 04/16/20 0431 04/17/20 0423 04/18/20 0114 04/19/20 0010 04/20/20 0049  NA 139  --   --   --   --  134* 138 137 138 138  K 3.6  --   --   --   --  3.9 3.9 3.6 3.7 4.2  CL 104  --   --   --   --  107 105 104 105 106  CO2 23  --   --   --   --  20* 25 25 25  21*  GLUCOSE 212*  --   --   --   --  113* 102* 114* 127* 110*  BUN 18  --   --   --   --  13 11 14 12 13   CREATININE 1.06*  --   --   --   --  0.79 0.67 0.73 0.64 0.75  CALCIUM 9.6  --   --   --   --  7.8* 8.1* 8.2* 8.4* 8.4*  AST 20  --   --   --   --   --  17 17 23 24   ALT 14  --   --   --   --   --  9 9 13 11   ALKPHOS 53  --   --   --   --    --  39 39 41 43  BILITOT 0.7  --   --   --   --   --  0.7 0.4 0.4 0.5  ALBUMIN 3.4*  --   --   --   --   --  2.4* 2.4* 2.6* 2.7*  MG  --   --   --   --   --   --  1.7 1.7 1.8 1.8  CRP  --   --   --   --  4.3*  --  10.5* 7.7* 4.3* 2.2*  DDIMER  --   --   --   --  3.46*  --  2.55* 2.31* 1.94* 1.85*  PROCALCITON  --   --   --   --  0.10  --   --   --   --   --   LATICACIDVEN  --  2.2* 1.7  --   --   --   --   --   --   --   INR  --  1.1  --   --   --   --   --   --   --   --   TSH  --   --   --  0.779  --   --   --   --   --   --   BNP  --   --   --  64.0  --   --  67.9 79.9 48.3 35.4    ------------------------------------------------------------------------------------------------------------------ No results for input(s): CHOL, HDL, LDLCALC, TRIG, CHOLHDL, LDLDIRECT in the last 72 hours.  Lab Results  Component Value Date   HGBA1C 5.9 (H) 01/02/2017   ------------------------------------------------------------------------------------------------------------------ No results for input(s): TSH, T4TOTAL, T3FREE, THYROIDAB in the last 72 hours.  Invalid input(s): FREET3  Cardiac Enzymes No results for input(s): CKMB, TROPONINI, MYOGLOBIN in the last 168 hours.  Invalid input(s): CK ------------------------------------------------------------------------------------------------------------------    Component Value Date/Time   BNP 35.4 04/20/2020 0049    Micro Results Recent Results (from the past 240 hour(s))  Culture, blood (routine x 2)     Status: None   Collection Time: 04/15/20  7:42 AM   Specimen: BLOOD  Result Value Ref Range Status   Specimen Description BLOOD LEFT ANTECUBITAL  Final   Special Requests   Final    BOTTLES DRAWN AEROBIC AND ANAEROBIC Blood Culture adequate volume   Culture   Final    NO GROWTH 5 DAYS Performed at [facility], [address] [place], [place] [address]    Report Status 04/20/2020 FINAL  Final  Resp Panel by RT-PCR (Flu A&B,  Covid) Nasopharyngeal Swab     Status: Abnormal   Collection Time: 04/15/20  7:42 AM   Specimen: Nasopharyngeal Swab; Nasopharyngeal(NP) swabs in vial transport medium  Result Value Ref Range Status   SARS Coronavirus 2 by RT PCR POSITIVE (A) NEGATIVE Final    Comment: RESULT CALLED TO, READ BACK BY AND VERIFIED WITH: RN S.BETRAND ON 04/15/2020 AT 0926 BY E.PARRISH (NOTE) SARS-CoV-2 target nucleic acids are DETECTED.  The SARS-CoV-2 RNA is generally detectable in upper respiratory specimens during the acute phase of infection. Positive results are indicative of the presence of the identified virus,  but do not rule out bacterial infection or co-infection with other pathogens not detected by the test. Clinical correlation with patient history and other diagnostic information is necessary to determine patient infection status. The expected result is Negative.  Fact Sheet for Patients: EntrepreneurPulse.com.au  Fact Sheet for Healthcare Providers: IncredibleEmployment.be  This test is not yet approved or cleared by the [place] FDA and  has been authorized for detection and/or diagnosis of SARS-CoV-2 by FDA under an Emergency Use Authorization (EUA).  This EUA will remain in effect (meaning this  test can be used) for the duration of  the COVID-19 declaration under Section 564(b)(1) of the Act, 21 U.S.C. section 360bbb-3(b)(1), unless the authorization is terminated or revoked sooner.     Influenza A by PCR NEGATIVE NEGATIVE Final   Influenza B by PCR NEGATIVE NEGATIVE Final    Comment: (NOTE) The Xpert Xpress SARS-CoV-2/FLU/RSV plus assay is intended as an aid in the diagnosis of influenza from Nasopharyngeal swab specimens and should not be used as a sole basis for treatment. Nasal washings and aspirates are unacceptable for Xpert Xpress SARS-CoV-2/FLU/RSV testing.  Fact Sheet for  Patients: EntrepreneurPulse.com.au  Fact Sheet for Healthcare Providers: IncredibleEmployment.be  This test is not yet approved or cleared by the [place] FDA and has been authorized for detection and/or diagnosis of SARS-CoV-2 by FDA under an Emergency Use Authorization (EUA). This EUA will remain in effect (meaning this test can be used) for the duration of the COVID-19 declaration under Section 564(b)(1) of the Act, 21 U.S.C. section 360bbb-3(b)(1), unless the authorization is terminated or revoked.  Performed at [facility], [place] [address], [place], [place] [address]   Culture, blood (routine x 2)     Status: None   Collection Time: 04/15/20  7:43 AM   Specimen: BLOOD RIGHT HAND  Result Value Ref Range Status   Specimen Description BLOOD RIGHT HAND  Final   Special Requests   Final    BOTTLES DRAWN AEROBIC ONLY Blood Culture results may not be optimal due to an inadequate volume of blood received in culture bottles   Culture   Final    NO GROWTH 5 DAYS Performed at [facility], [place] [address], [place], [place] [address]    Report Status 04/20/2020 FINAL  Final    Radiology Reports DG Wrist Complete Left  Result Date: 04/16/2020 CLINICAL DATA:  Left wrist swelling. EXAM: LEFT WRIST - COMPLETE 3+ VIEW COMPARISON:  None. FINDINGS: Degenerative changes between the base of the first metacarpal and trapezium. Degenerative changes between the trapezium and scaphoid. Soft tissue swelling best seen on the lateral view. No fractures or bony erosions identified. IMPRESSION: Soft tissue swelling.  Degenerative changes as above. Electronically Signed   By: Dorise Bullion III M.D   On: 04/16/2020 12:33   CT Head Wo Contrast  Result Date: 04/15/2020 CLINICAL DATA:  Syncope with normal neuro exam EXAM: CT HEAD WITHOUT CONTRAST TECHNIQUE: Contiguous axial images were obtained from the base of the skull through the vertex without  intravenous contrast. COMPARISON:  Brain MRI 01/02/2017 FINDINGS: Brain: No evidence of acute infarction, hemorrhage, hydrocephalus, extra-axial collection or mass effect. 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 Stable calcification in the right posterior frontal white matter age congruent cerebral volume loss. Known meningioma at the right vertex again measuring 9 mm. Vascular: No hyperdense vessel or unexpected calcification. Skull: Normal. Negative for fracture or focal lesion. Sinuses/Orbits: No acute finding. IMPRESSION: No  acute finding or change from 2018 Electronically Signed   By: Monte Fantasia M.D.   On: 04/15/2020 08:42   CT Angio Chest PE W and/or Wo Contrast  Result Date: 04/15/2020 CLINICAL DATA:  Abdominal pain, nonlocalized, weakness, fatigue, chronic RIGHT breast infection for years drainage EXAM: CT ANGIOGRAPHY CHEST CT ABDOMEN AND PELVIS WITH CONTRAST TECHNIQUE: Multidetector CT imaging of the chest was performed using the standard protocol during bolus administration of intravenous contrast. Multiplanar CT image reconstructions and MIPs were obtained to evaluate the vascular anatomy. Multidetector CT imaging of the abdomen and pelvis was performed using the standard protocol during bolus administration of intravenous contrast. CONTRAST:  172mL OMNIPAQUE IOHEXOL 350 MG/ML SOLN IV. No oral contrast. COMPARISON:  None FINDINGS: CTA CHEST FINDINGS Cardiovascular: Atherosclerotic calcifications aorta, coronary arteries, and proximal great vessels. Aorta normal caliber without aneurysm or dissection. Heart unremarkable. No pericardial effusion. Pulmonary arteries adequately opacified and patent. No evidence of pulmonary embolism. Mediastinum/Nodes: Base of cervical region normal appearance. Esophagus unremarkable. Large lobulated soft tissue mass identified at RIGHT breast extending through skin surface, 8.1 x 4.9 x 7.1 cm in size most consistent with  malignancy rather than infection. Mass extends to the chest wall with loss of fat plane between the mass and the underlying pectoralis muscle. Additional small nodule within RIGHT breast 14 x 7 mm question second mass versus lymph node. RIGHT axillary adenopathy with nodes measuring up to 18 mm short axis. Normal sized mediastinal lymph nodes. Lungs/Pleura: Emphysematous changes. Dependent atelectasis. Lungs otherwise clear. No pulmonary infiltrate, pleural effusion, or pneumothorax. No pulmonary mass/nodule. Musculoskeletal: No acute osseous findings. Review of the MIP images confirms the above findings. CT ABDOMEN and PELVIS FINDINGS Hepatobiliary: Gallbladder and liver normal appearance Pancreas: Atrophic without focal mass Spleen: Normal appearance Adrenals/Urinary Tract: Cortical scarring at inferior pole LEFT kidney. Adrenal glands, kidneys, ureters, and bladder otherwise normal appearance Stomach/Bowel: Appendix not visualized, no pericecal inflammatory process seen. Stomach decompressed. Bowel wall thickening of descending colon with mild surrounding infiltrative changes over a long length favoring descending colitis. Minimal thickening of LEFT lateral conal fascia. Sigmoid diverticulosis without evidence of diverticulitis. Remaining bowel loops unremarkable. Vascular/Lymphatic: Atherosclerotic calcifications aorta, iliac arteries, visceral arteries. Infrarenal abdominal aortic aneurysm 4.2 x 4.0 cm in greatest axial dimensions extending 6.6 cm length, terminating above bifurcation. Moderate thrombus. No perianeurysmal infiltration/hemorrhage. No adenopathy. Reproductive: Atrophic uterus with unremarkable adnexa Other: No free air or free fluid.  No hernia. Musculoskeletal: Osseous demineralization. Review of the MIP images confirms the above findings. IMPRESSION: No evidence of pulmonary embolism. Large RIGHT breast mass 8.1 cm greatest size most consistent with malignancy with associated RIGHT axillary  adenopathy. Additional small RIGHT breast nodule 14 x 7 mm question mass versus intramammary node. COPD. Descending colitis. Sigmoid diverticulosis without evidence of diverticulitis. Infrarenal abdominal aortic aneurysm 4.2 cm in greatest axial dimensions; Recommend follow-up every 12 months and vascular consultation. This recommendation follows ACR consensus guidelines: White Paper of the ACR Incidental Findings Committee II on Vascular Findings. J Am Coll Radiol 2013; 10:789-794. Emphysema (ICD10-J43.9). Aortic Atherosclerosis (ICD10-I70.0). Aortic aneurysm NOS (ICD10-I71.9).: Findings called to Dr.  Francia Greaves on 04/15/2020 at 0857 hours. Electronically Signed   By: Lavonia Dana M.D.   On: 04/15/2020 08:58   CT ABDOMEN PELVIS W CONTRAST  Result Date: 04/15/2020 CLINICAL DATA:  Abdominal pain, nonlocalized, weakness, fatigue, chronic RIGHT breast infection for years drainage EXAM: CT ANGIOGRAPHY CHEST CT ABDOMEN AND PELVIS WITH CONTRAST TECHNIQUE: Multidetector CT imaging of the chest was performed using the  standard protocol during bolus administration of intravenous contrast. Multiplanar CT image reconstructions and MIPs were obtained to evaluate the vascular anatomy. Multidetector CT imaging of the abdomen and pelvis was performed using the standard protocol during bolus administration of intravenous contrast. CONTRAST:  142mL OMNIPAQUE IOHEXOL 350 MG/ML SOLN IV. No oral contrast. COMPARISON:  None FINDINGS: CTA CHEST FINDINGS Cardiovascular: Atherosclerotic calcifications aorta, coronary arteries, and proximal great vessels. Aorta normal caliber without aneurysm or dissection. Heart unremarkable. No pericardial effusion. Pulmonary arteries adequately opacified and patent. No evidence of pulmonary embolism. Mediastinum/Nodes: Base of cervical region normal appearance. Esophagus unremarkable. Large lobulated soft tissue mass identified at RIGHT breast extending through skin surface, 8.1 x 4.9 x 7.1 cm in size  most consistent with malignancy rather than infection. Mass extends to the chest wall with loss of fat plane between the mass and the underlying pectoralis muscle. Additional small nodule within RIGHT breast 14 x 7 mm question second mass versus lymph node. RIGHT axillary adenopathy with nodes measuring up to 18 mm short axis. Normal sized mediastinal lymph nodes. Lungs/Pleura: Emphysematous changes. Dependent atelectasis. Lungs otherwise clear. No pulmonary infiltrate, pleural effusion, or pneumothorax. No pulmonary mass/nodule. Musculoskeletal: No acute osseous findings. Review of the MIP images confirms the above findings. CT ABDOMEN and PELVIS FINDINGS Hepatobiliary: Gallbladder and liver normal appearance Pancreas: Atrophic without focal mass Spleen: Normal appearance Adrenals/Urinary Tract: Cortical scarring at inferior pole LEFT kidney. Adrenal glands, kidneys, ureters, and bladder otherwise normal appearance Stomach/Bowel: Appendix not visualized, no pericecal inflammatory process seen. Stomach decompressed. Bowel wall thickening of descending colon with mild surrounding infiltrative changes over a long length favoring descending colitis. Minimal thickening of LEFT lateral conal fascia. Sigmoid diverticulosis without evidence of diverticulitis. Remaining bowel loops unremarkable. Vascular/Lymphatic: Atherosclerotic calcifications aorta, iliac arteries, visceral arteries. Infrarenal abdominal aortic aneurysm 4.2 x 4.0 cm in greatest axial dimensions extending 6.6 cm length, terminating above bifurcation. Moderate thrombus. No perianeurysmal infiltration/hemorrhage. No adenopathy. Reproductive: Atrophic uterus with unremarkable adnexa Other: No free air or free fluid.  No hernia. Musculoskeletal: Osseous demineralization. Review of the MIP images confirms the above findings. IMPRESSION: No evidence of pulmonary embolism. Large RIGHT breast mass 8.1 cm greatest size most consistent with malignancy with  associated RIGHT axillary adenopathy. Additional small RIGHT breast nodule 14 x 7 mm question mass versus intramammary node. COPD. Descending colitis. Sigmoid diverticulosis without evidence of diverticulitis. Infrarenal abdominal aortic aneurysm 4.2 cm in greatest axial dimensions; Recommend follow-up every 12 months and vascular consultation. This recommendation follows ACR consensus guidelines: White Paper of the ACR Incidental Findings Committee II on Vascular Findings. J Am Coll Radiol 2013; 10:789-794. Emphysema (ICD10-J43.9). Aortic Atherosclerosis (ICD10-I70.0). Aortic aneurysm NOS (ICD10-I71.9).: Findings called to Dr.  Francia Greaves on 04/15/2020 at 0857 hours. Electronically Signed   By: Lavonia Dana M.D.   On: 04/15/2020 08:58   DG Chest Port 1 View  Result Date: 04/15/2020 CLINICAL DATA:  Recent syncopal episode EXAM: PORTABLE CHEST 1 VIEW COMPARISON:  None. FINDINGS: Cardiac shadows within normal limits. Aortic calcifications are noted. Lungs are hyperinflated bilaterally. Rounded density is noted in the right base measuring proximally 6 cm. This may represent fluid within the major fissure although the possibility of underlying mass deserves consideration. CT of the chest with contrast is recommended for further evaluation. IMPRESSION: Rounded density in the right lung base suspicious for underlying mass. CT is recommended for further evaluation. Electronically Signed   By: Inez Catalina M.D.   On: 04/15/2020 07:36   VAS [place] LOWER EXTREMITY VENOUS (  DVT)  Result Date: 04/16/2020  Lower Venous DVT Study Indications: Rapidly rising D-dimer & Covid+.  Comparison Study: No previous exams Performing Technologist: Rogelia Rohrer  Examination Guidelines: A complete evaluation includes B-mode imaging, spectral Doppler, color Doppler, and power Doppler as needed of all accessible portions of each vessel. Bilateral testing is considered an integral part of a complete examination. Limited examinations for reoccurring  indications may be performed as noted. The reflux portion of the exam is performed with the patient in reverse Trendelenburg.  +---------+---------------+---------+-----------+----------+--------------+ RIGHT    CompressibilityPhasicitySpontaneityPropertiesThrombus Aging +---------+---------------+---------+-----------+----------+--------------+ CFV      Full           Yes      Yes                                 +---------+---------------+---------+-----------+----------+--------------+ SFJ      Full                                                        +---------+---------------+---------+-----------+----------+--------------+ FV Prox  Full           Yes      Yes                                 +---------+---------------+---------+-----------+----------+--------------+ FV Mid   Full           Yes      Yes                                 +---------+---------------+---------+-----------+----------+--------------+ FV DistalFull           Yes      Yes                                 +---------+---------------+---------+-----------+----------+--------------+ PFV      Full                                                        +---------+---------------+---------+-----------+----------+--------------+ POP      Full           Yes      Yes                                 +---------+---------------+---------+-----------+----------+--------------+ PTV      Full                                                        +---------+---------------+---------+-----------+----------+--------------+ PERO     Full                                                        +---------+---------------+---------+-----------+----------+--------------+   +---------+---------------+---------+-----------+----------+--------------+  LEFT     CompressibilityPhasicitySpontaneityPropertiesThrombus Aging  +---------+---------------+---------+-----------+----------+--------------+ CFV      Full                                                        +---------+---------------+---------+-----------+----------+--------------+ SFJ      Full                                                        +---------+---------------+---------+-----------+----------+--------------+ FV Prox  Full           Yes      Yes                                 +---------+---------------+---------+-----------+----------+--------------+ FV Mid   Full           Yes      Yes                                 +---------+---------------+---------+-----------+----------+--------------+ FV DistalFull           Yes      Yes                                 +---------+---------------+---------+-----------+----------+--------------+ PFV      Full                                                        +---------+---------------+---------+-----------+----------+--------------+ POP      Full           Yes      Yes                                 +---------+---------------+---------+-----------+----------+--------------+ PTV      Full                                                        +---------+---------------+---------+-----------+----------+--------------+ PERO     Full                                                        +---------+---------------+---------+-----------+----------+--------------+     Summary: BILATERAL: - No evidence of deep vein thrombosis seen in the lower extremities, bilaterally. - No evidence of superficial venous thrombosis in the lower extremities, bilaterally. -No evidence of popliteal cyst, bilaterally.   *See table(s) above for measurements and observations. Electronically signed by Jamelle Haring on 04/16/2020 at 5:48:36 PM.  Final

## 2020-04-20 NOTE — TOC Progression Note (Signed)
Transition of Care [facility]) - Progression Note    Patient Details  Name: Elizabeth Chase MRN: [id number] Date of Birth: May 04, 1940  [facility] CM/SW [place], LCSW Phone Number: 04/20/2020, 8:53 AM  Clinical Narrative:    CSW received call back from [facility], [place]. She suggested patient's daughter go to the [employer] this morning from 9am-10:30am to meet with their housing assessment team. CSW left voicemail for daughter and sent HIPAA compliant text to her to see if she can go to to this appointment.    Expected Discharge Plan: Home/Self Care Barriers to Discharge: Continued Medical Work up,Homeless with medical needs  Expected Discharge Plan and Services Expected Discharge Plan: Home/Self Care In-house Referral: Clinical Social Work     Living arrangements for the past 2 months: Homeless (car)                                       Social Determinants of Health (SDOH) Interventions    Readmission Risk Interventions No flowsheet data found.

## 2020-04-21 DIAGNOSIS — N6310 Unspecified lump in the right breast, unspecified quadrant: Secondary | ICD-10-CM | POA: Diagnosis not present

## 2020-04-21 NOTE — Progress Notes (Signed)
PROGRESS NOTE                                                                                                                                                                                                             Patient Demographics:    Elizabeth Chase, is a 80 y.o. female, DOB - 1940/05/09, XTG:[id number]  Outpatient Primary MD for the patient is No primary care provider on file.    LOS - 6  Admit date - 04/14/2020    Chief Complaint  Patient presents with  . Breast Infection        Brief Narrative (HPI from H&P)   - Elizabeth Chase is a 80 y.o. female with medical history significant of hypertension and hyperlipidemia who presented episode of syncope where she stood up to walk and almost passed out, was caught by her daughter on her way down, lost consciousness for a few minutes with no bowel or bladder incontinence, in the ER she was found to be dehydrated and hypotensive, on exam she was found to have a fungating right breast mass, imaging suggested colitis along with right breast malignancy and she was admitted to the hospital.   Subjective:   Patient in bed, she denies any complaints, reports good bowel movements, appetite is well as well, she denies any chest pain, fever or chills .   Assessment  & Plan :   Syncope due to hypotension.  - Caused by dehydration and possible mild colitis.  Currently no diarrhea, has been hydrated with IV fluids and is feeling better already, continue hydration, PT OT and monitor.  Head CT is nonacute and exam is nonfocal  Possible colitis.   -Currently no diarrhea.  Did with IV Flagyl and cephalosporin initially, currently off antibiotics  Breast cancer with fungating right breast mass with right axillary lymphadenopathy.  -History significant for invasive ductal carcinoma. -And oncology input greatly appreciated, awaiting further biopsy results as discussed with oncology(receptors),  and likely will need chemo, then resection followed by radiation.  Left wrist swelling appears to be a hematoma at the site of IV.  IV removed, stable X-Ray.  Much improved.  Fully vaccinated with incidental Covid infection.   - 3 days of remdesivir.  Elevated D-dimer.  - CTA lungs is negative.  Venous Dopplers is negative for DVT.   Poor social situation.  Homeless.  Social work following.       Condition - Extremely Guarded  Family Communication  :  None at  Bedside, D/W daughter by phone.  Code Status :  Full  Consults  :  CCS, Onc  PUD Prophylaxis :    Procedures  :     Right breast excision biopsy by general surgery on 04/18/2020.    CT head - Non acute  CT Chest - Abd - Pelvis - No evidence of pulmonary embolism. Large RIGHT breast mass 8.1 cm greatest size most consistent with malignancy with associated RIGHT axillary adenopathy. Additional small RIGHT breast nodule 14 x 7 mm question mass versus intramammary node. COPD. Descending colitis. Sigmoid diverticulosis without evidence of diverticulitis. Infrarenal abdominal aortic aneurysm 4.2 cm in greatest axial dimensions      Disposition Plan  :    Status is: Inpatient  Remains inpatient appropriate because:IV treatments appropriate due to intensity of illness or inability to take PO   Dispo: The patient is from: homeless              Anticipated d/c is to: Patient is homeless              Patient currently is medically stable to d/c.   Difficult to place patient No  DVT Prophylaxis  :   Heparin - SCDs   Lab Results  Component Value Date   PLT 368 04/20/2020    Diet :  Diet Order            Diet regular Room service appropriate? Yes; Fluid consistency: Thin  Diet effective now                  Inpatient Medications  Scheduled Meds: . vitamin C  500 mg Oral Daily  . atorvastatin  20 mg Oral q1800  . fluticasone furoate-vilanterol  1 puff Inhalation Daily  . heparin injection (subcutaneous)   5,000 Units Subcutaneous Q8H  . lidocaine-EPINEPHrine  20 mL Intradermal Once  . metroNIDAZOLE  500 mg Oral Q8H  . sodium chloride flush  3 mL Intravenous Q12H  . zinc sulfate  220 mg Oral Daily   Continuous Infusions:  PRN Meds:.acetaminophen **OR** [DISCONTINUED] acetaminophen, albuterol, guaiFENesin-dextromethorphan, silver nitrate applicators, traZODone  Antibiotics  :    Anti-infectives (From admission, onward)   Start     Dose/Rate Route Frequency Ordered Stop   04/18/20 1400  metroNIDAZOLE (FLAGYL) tablet 500 mg        500 mg Oral Every 8 hours 04/18/20 1049 04/22/20 0559   04/16/20 1000  remdesivir 100 mg in sodium chloride 0.9 % 100 mL IVPB       "Followed by" Linked Group Details   100 mg 200 mL/hr over 30 Minutes Intravenous Daily 04/15/20 1203 04/17/20 1109   04/15/20 1915  metroNIDAZOLE (FLAGYL) IVPB 500 mg  Status:  Discontinued        500 mg 100 mL/hr over 60 Minutes Intravenous Every 8 hours 04/15/20 1822 04/18/20 1049   04/15/20 1630  ceFAZolin (ANCEF) IVPB 1 g/50 mL premix  Status:  Discontinued        1 g 100 mL/hr over 30 Minutes Intravenous Every 8 hours 04/15/20 1154 04/18/20 1049   04/15/20 1400  remdesivir 200 mg in sodium chloride 0.9% 250 mL IVPB       "Followed by" Linked Group Details   200 mg 580 mL/hr over 30 Minutes Intravenous Once 04/15/20 1203 04/15/20 2101   04/15/20 0745  ceFAZolin (  ANCEF) IVPB 1 g/50 mL premix        1 g 100 mL/hr over 30 Minutes Intravenous  Once 04/15/20 0732 04/15/20 0902        Dawood Elgergawy M.D on 04/21/2020 at 1:54 PM  To page go to [email or web address]   [facility] -  Office  [phone]    See all Orders from today for further details    Objective:   Vitals:   04/20/20 1414 04/20/20 2012 04/21/20 0510 04/21/20 1213  BP: 123/65 (!) 136/48 (!) 143/70 116/83  Pulse: 85 77 81 77  Resp: 17 20 18 16   Temp: 97.9 F (36.6 C) 97.9 F (36.6 C) 97.8 F (36.6 C) 98 F (36.7 C)  TempSrc:  Oral Oral Oral   SpO2: 92% 93% 94% 96%  Weight:      Height:        Wt Readings from Last 3 Encounters:  04/15/20 54.9 kg  03/01/17 (!) 558.7 kg  01/15/17 51.9 kg     Intake/Output Summary (Last 24 hours) at 04/21/2020 1354 Last data filed at 04/21/2020 0830 Gross per 24 hour  Intake 240 ml  Output -  Net 240 ml     Physical Exam  Awake Alert, Oriented X 3, extremely frail, no new F.N deficits, Normal affect Symmetrical Chest wall movement, Good air movement bilaterally, CTAB RRR,No Gallops,Rubs or new Murmurs, No Parasternal Heave +ve B.Sounds, Abd Soft, No tenderness, No rebound - guarding or rigidity. No Cyanosis, Clubbing or edema, No new Rash or bruise       Data Review:    CBC Recent Labs  Lab 04/14/20 2259 04/16/20 0431 04/17/20 0423 04/18/20 0114 04/19/20 0010 04/20/20 0049  WBC 7.8 8.1 6.0 6.0 6.0 6.3  HGB 12.0 10.4* 9.9* 10.3* 10.2* 10.5*  HCT 39.3 31.8* 29.9* 32.2* 31.8* 31.2*  PLT 336 255 279 300 349 368  MCV 92.7 90.9 88.7 89.4 89.8 87.9  MCH 28.3 29.7 29.4 28.6 28.8 29.6  MCHC 30.5 32.7 33.1 32.0 32.1 33.7  RDW 13.5 13.7 13.6 13.2 13.2 13.3  LYMPHSABS 1.0  --  1.0 1.0 1.1 1.3  MONOABS 0.4  --  0.5 0.6 0.6 0.6  EOSABS 0.1  --  0.3 0.4 0.5 0.4  BASOSABS 0.0  --  0.0 0.0 0.0 0.1    Recent Labs  Lab 04/14/20 2259 04/15/20 0742 04/15/20 0918 04/15/20 1254 04/15/20 1302 04/16/20 0431 04/17/20 0423 04/18/20 0114 04/19/20 0010 04/20/20 0049  NA 139  --   --   --   --  134* 138 137 138 138  K 3.6  --   --   --   --  3.9 3.9 3.6 3.7 4.2  CL 104  --   --   --   --  107 105 104 105 106  CO2 23  --   --   --   --  20* 25 25 25  21*  GLUCOSE 212*  --   --   --   --  113* 102* 114* 127* 110*  BUN 18  --   --   --   --  13 11 14 12 13   CREATININE 1.06*  --   --   --   --  0.79 0.67 0.73 0.64 0.75  CALCIUM 9.6  --   --   --   --  7.8* 8.1* 8.2* 8.4* 8.4*  AST 20  --   --   --   --   --  17  17 23 24   ALT 14  --   --   --   --   --  9 9 13 11   ALKPHOS 53  --    --   --   --   --  39 39 41 43  BILITOT 0.7  --   --   --   --   --  0.7 0.4 0.4 0.5  ALBUMIN 3.4*  --   --   --   --   --  2.4* 2.4* 2.6* 2.7*  MG  --   --   --   --   --   --  1.7 1.7 1.8 1.8  CRP  --   --   --   --  4.3*  --  10.5* 7.7* 4.3* 2.2*  DDIMER  --   --   --   --  3.46*  --  2.55* 2.31* 1.94* 1.85*  PROCALCITON  --   --   --   --  0.10  --   --   --   --   --   LATICACIDVEN  --  2.2* 1.7  --   --   --   --   --   --   --   INR  --  1.1  --   --   --   --   --   --   --   --   TSH  --   --   --  0.779  --   --   --   --   --   --   BNP  --   --   --  64.0  --   --  67.9 79.9 48.3 35.4    ------------------------------------------------------------------------------------------------------------------ No results for input(s): CHOL, HDL, LDLCALC, TRIG, CHOLHDL, LDLDIRECT in the last 72 hours.  Lab Results  Component Value Date   HGBA1C 5.9 (H) 01/02/2017   ------------------------------------------------------------------------------------------------------------------ No results for input(s): TSH, T4TOTAL, T3FREE, THYROIDAB in the last 72 hours.  Invalid input(s): FREET3  Cardiac Enzymes No results for input(s): CKMB, TROPONINI, MYOGLOBIN in the last 168 hours.  Invalid input(s): CK ------------------------------------------------------------------------------------------------------------------    Component Value Date/Time   BNP 35.4 04/20/2020 0049    Micro Results Recent Results (from the past 240 hour(s))  Culture, blood (routine x 2)     Status: None   Collection Time: 04/15/20  7:42 AM   Specimen: BLOOD  Result Value Ref Range Status   Specimen Description BLOOD LEFT ANTECUBITAL  Final   Special Requests   Final    BOTTLES DRAWN AEROBIC AND ANAEROBIC Blood Culture adequate volume   Culture   Final    NO GROWTH 5 DAYS Performed at [facility], [address] [place], [place] [address]    Report Status 04/20/2020 FINAL  Final  Resp Panel by  RT-PCR (Flu A&B, Covid) Nasopharyngeal Swab     Status: Abnormal   Collection Time: 04/15/20  7:42 AM   Specimen: Nasopharyngeal Swab; Nasopharyngeal(NP) swabs in vial transport medium  Result Value Ref Range Status   SARS Coronavirus 2 by RT PCR POSITIVE (A) NEGATIVE Final    Comment: RESULT CALLED TO, READ BACK BY AND VERIFIED WITH: RN S.BETRAND ON 04/15/2020 AT 0926 BY E.PARRISH (NOTE) SARS-CoV-2 target nucleic acids are DETECTED.  The SARS-CoV-2 RNA is generally detectable in upper respiratory specimens during the acute phase of infection. Positive results are indicative of the presence of the identified virus, but  do not rule out bacterial infection or co-infection with other pathogens not detected by the test. Clinical correlation with patient history and other diagnostic information is necessary to determine patient infection status. The expected result is Negative.  Fact Sheet for Patients: EntrepreneurPulse.com.au  Fact Sheet for Healthcare Providers: IncredibleEmployment.be  This test is not yet approved or cleared by the [place] FDA and  has been authorized for detection and/or diagnosis of SARS-CoV-2 by FDA under an Emergency Use Authorization (EUA).  This EUA will remain in effect (meaning this  test can be used) for the duration of  the COVID-19 declaration under Section 564(b)(1) of the Act, 21 U.S.C. section 360bbb-3(b)(1), unless the authorization is terminated or revoked sooner.     Influenza A by PCR NEGATIVE NEGATIVE Final   Influenza B by PCR NEGATIVE NEGATIVE Final    Comment: (NOTE) The Xpert Xpress SARS-CoV-2/FLU/RSV plus assay is intended as an aid in the diagnosis of influenza from Nasopharyngeal swab specimens and should not be used as a sole basis for treatment. Nasal washings and aspirates are unacceptable for Xpert Xpress SARS-CoV-2/FLU/RSV testing.  Fact Sheet for  Patients: EntrepreneurPulse.com.au  Fact Sheet for Healthcare Providers: IncredibleEmployment.be  This test is not yet approved or cleared by the [place] FDA and has been authorized for detection and/or diagnosis of SARS-CoV-2 by FDA under an Emergency Use Authorization (EUA). This EUA will remain in effect (meaning this test can be used) for the duration of the COVID-19 declaration under Section 564(b)(1) of the Act, 21 U.S.C. section 360bbb-3(b)(1), unless the authorization is terminated or revoked.  Performed at [facility], [place] [address] [place], [place] [address]   Culture, blood (routine x 2)     Status: None   Collection Time: 04/15/20  7:43 AM   Specimen: BLOOD RIGHT HAND  Result Value Ref Range Status   Specimen Description BLOOD RIGHT HAND  Final   Special Requests   Final    BOTTLES DRAWN AEROBIC ONLY Blood Culture results may not be optimal due to an inadequate volume of blood received in culture bottles   Culture   Final    NO GROWTH 5 DAYS Performed at [facility], [place] [address] [place], [place] [address]    Report Status 04/20/2020 FINAL  Final    Radiology Reports DG Wrist Complete Left  Result Date: 04/16/2020 CLINICAL DATA:  Left wrist swelling. EXAM: LEFT WRIST - COMPLETE 3+ VIEW COMPARISON:  None. FINDINGS: Degenerative changes between the base of the first metacarpal and trapezium. Degenerative changes between the trapezium and scaphoid. Soft tissue swelling best seen on the lateral view. No fractures or bony erosions identified. IMPRESSION: Soft tissue swelling.  Degenerative changes as above. Electronically Signed   By: Dorise Bullion III M.D   On: 04/16/2020 12:33   CT Head Wo Contrast  Result Date: 04/15/2020 CLINICAL DATA:  Syncope with normal neuro exam EXAM: CT HEAD WITHOUT CONTRAST TECHNIQUE: Contiguous axial images were obtained from the base of the skull through the vertex without  intravenous contrast. COMPARISON:  Brain MRI 01/02/2017 FINDINGS: Brain: No evidence of acute infarction, hemorrhage, hydrocephalus, extra-axial collection or mass effect. 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 Stable calcification in the right posterior frontal white matter age congruent cerebral volume loss. Known meningioma at the right vertex again measuring 9 mm. Vascular: No hyperdense vessel or unexpected calcification. Skull: Normal. Negative for fracture or focal lesion. Sinuses/Orbits: No acute finding. IMPRESSION: No acute  finding or change from 2018 Electronically Signed   By: Monte Fantasia M.D.   On: 04/15/2020 08:42   CT Angio Chest PE W and/or Wo Contrast  Result Date: 04/15/2020 CLINICAL DATA:  Abdominal pain, nonlocalized, weakness, fatigue, chronic RIGHT breast infection for years drainage EXAM: CT ANGIOGRAPHY CHEST CT ABDOMEN AND PELVIS WITH CONTRAST TECHNIQUE: Multidetector CT imaging of the chest was performed using the standard protocol during bolus administration of intravenous contrast. Multiplanar CT image reconstructions and MIPs were obtained to evaluate the vascular anatomy. Multidetector CT imaging of the abdomen and pelvis was performed using the standard protocol during bolus administration of intravenous contrast. CONTRAST:  176mL OMNIPAQUE IOHEXOL 350 MG/ML SOLN IV. No oral contrast. COMPARISON:  None FINDINGS: CTA CHEST FINDINGS Cardiovascular: Atherosclerotic calcifications aorta, coronary arteries, and proximal great vessels. Aorta normal caliber without aneurysm or dissection. Heart unremarkable. No pericardial effusion. Pulmonary arteries adequately opacified and patent. No evidence of pulmonary embolism. Mediastinum/Nodes: Base of cervical region normal appearance. Esophagus unremarkable. Large lobulated soft tissue mass identified at RIGHT breast extending through skin surface, 8.1 x 4.9 x 7.1 cm in size most consistent with  malignancy rather than infection. Mass extends to the chest wall with loss of fat plane between the mass and the underlying pectoralis muscle. Additional small nodule within RIGHT breast 14 x 7 mm question second mass versus lymph node. RIGHT axillary adenopathy with nodes measuring up to 18 mm short axis. Normal sized mediastinal lymph nodes. Lungs/Pleura: Emphysematous changes. Dependent atelectasis. Lungs otherwise clear. No pulmonary infiltrate, pleural effusion, or pneumothorax. No pulmonary mass/nodule. Musculoskeletal: No acute osseous findings. Review of the MIP images confirms the above findings. CT ABDOMEN and PELVIS FINDINGS Hepatobiliary: Gallbladder and liver normal appearance Pancreas: Atrophic without focal mass Spleen: Normal appearance Adrenals/Urinary Tract: Cortical scarring at inferior pole LEFT kidney. Adrenal glands, kidneys, ureters, and bladder otherwise normal appearance Stomach/Bowel: Appendix not visualized, no pericecal inflammatory process seen. Stomach decompressed. Bowel wall thickening of descending colon with mild surrounding infiltrative changes over a long length favoring descending colitis. Minimal thickening of LEFT lateral conal fascia. Sigmoid diverticulosis without evidence of diverticulitis. Remaining bowel loops unremarkable. Vascular/Lymphatic: Atherosclerotic calcifications aorta, iliac arteries, visceral arteries. Infrarenal abdominal aortic aneurysm 4.2 x 4.0 cm in greatest axial dimensions extending 6.6 cm length, terminating above bifurcation. Moderate thrombus. No perianeurysmal infiltration/hemorrhage. No adenopathy. Reproductive: Atrophic uterus with unremarkable adnexa Other: No free air or free fluid.  No hernia. Musculoskeletal: Osseous demineralization. Review of the MIP images confirms the above findings. IMPRESSION: No evidence of pulmonary embolism. Large RIGHT breast mass 8.1 cm greatest size most consistent with malignancy with associated RIGHT axillary  adenopathy. Additional small RIGHT breast nodule 14 x 7 mm question mass versus intramammary node. COPD. Descending colitis. Sigmoid diverticulosis without evidence of diverticulitis. Infrarenal abdominal aortic aneurysm 4.2 cm in greatest axial dimensions; Recommend follow-up every 12 months and vascular consultation. This recommendation follows ACR consensus guidelines: White Paper of the ACR Incidental Findings Committee II on Vascular Findings. J Am Coll Radiol 2013; 10:789-794. Emphysema (ICD10-J43.9). Aortic Atherosclerosis (ICD10-I70.0). Aortic aneurysm NOS (ICD10-I71.9).: Findings called to Dr.  Francia Greaves on 04/15/2020 at 0857 hours. Electronically Signed   By: Lavonia Dana M.D.   On: 04/15/2020 08:58   CT ABDOMEN PELVIS W CONTRAST  Result Date: 04/15/2020 CLINICAL DATA:  Abdominal pain, nonlocalized, weakness, fatigue, chronic RIGHT breast infection for years drainage EXAM: CT ANGIOGRAPHY CHEST CT ABDOMEN AND PELVIS WITH CONTRAST TECHNIQUE: Multidetector CT imaging of the chest was performed using the standard  protocol during bolus administration of intravenous contrast. Multiplanar CT image reconstructions and MIPs were obtained to evaluate the vascular anatomy. Multidetector CT imaging of the abdomen and pelvis was performed using the standard protocol during bolus administration of intravenous contrast. CONTRAST:  165mL OMNIPAQUE IOHEXOL 350 MG/ML SOLN IV. No oral contrast. COMPARISON:  None FINDINGS: CTA CHEST FINDINGS Cardiovascular: Atherosclerotic calcifications aorta, coronary arteries, and proximal great vessels. Aorta normal caliber without aneurysm or dissection. Heart unremarkable. No pericardial effusion. Pulmonary arteries adequately opacified and patent. No evidence of pulmonary embolism. Mediastinum/Nodes: Base of cervical region normal appearance. Esophagus unremarkable. Large lobulated soft tissue mass identified at RIGHT breast extending through skin surface, 8.1 x 4.9 x 7.1 cm in size  most consistent with malignancy rather than infection. Mass extends to the chest wall with loss of fat plane between the mass and the underlying pectoralis muscle. Additional small nodule within RIGHT breast 14 x 7 mm question second mass versus lymph node. RIGHT axillary adenopathy with nodes measuring up to 18 mm short axis. Normal sized mediastinal lymph nodes. Lungs/Pleura: Emphysematous changes. Dependent atelectasis. Lungs otherwise clear. No pulmonary infiltrate, pleural effusion, or pneumothorax. No pulmonary mass/nodule. Musculoskeletal: No acute osseous findings. Review of the MIP images confirms the above findings. CT ABDOMEN and PELVIS FINDINGS Hepatobiliary: Gallbladder and liver normal appearance Pancreas: Atrophic without focal mass Spleen: Normal appearance Adrenals/Urinary Tract: Cortical scarring at inferior pole LEFT kidney. Adrenal glands, kidneys, ureters, and bladder otherwise normal appearance Stomach/Bowel: Appendix not visualized, no pericecal inflammatory process seen. Stomach decompressed. Bowel wall thickening of descending colon with mild surrounding infiltrative changes over a long length favoring descending colitis. Minimal thickening of LEFT lateral conal fascia. Sigmoid diverticulosis without evidence of diverticulitis. Remaining bowel loops unremarkable. Vascular/Lymphatic: Atherosclerotic calcifications aorta, iliac arteries, visceral arteries. Infrarenal abdominal aortic aneurysm 4.2 x 4.0 cm in greatest axial dimensions extending 6.6 cm length, terminating above bifurcation. Moderate thrombus. No perianeurysmal infiltration/hemorrhage. No adenopathy. Reproductive: Atrophic uterus with unremarkable adnexa Other: No free air or free fluid.  No hernia. Musculoskeletal: Osseous demineralization. Review of the MIP images confirms the above findings. IMPRESSION: No evidence of pulmonary embolism. Large RIGHT breast mass 8.1 cm greatest size most consistent with malignancy with  associated RIGHT axillary adenopathy. Additional small RIGHT breast nodule 14 x 7 mm question mass versus intramammary node. COPD. Descending colitis. Sigmoid diverticulosis without evidence of diverticulitis. Infrarenal abdominal aortic aneurysm 4.2 cm in greatest axial dimensions; Recommend follow-up every 12 months and vascular consultation. This recommendation follows ACR consensus guidelines: White Paper of the ACR Incidental Findings Committee II on Vascular Findings. J Am Coll Radiol 2013; 10:789-794. Emphysema (ICD10-J43.9). Aortic Atherosclerosis (ICD10-I70.0). Aortic aneurysm NOS (ICD10-I71.9).: Findings called to Dr.  Francia Greaves on 04/15/2020 at 0857 hours. Electronically Signed   By: Lavonia Dana M.D.   On: 04/15/2020 08:58   DG Chest Port 1 View  Result Date: 04/15/2020 CLINICAL DATA:  Recent syncopal episode EXAM: PORTABLE CHEST 1 VIEW COMPARISON:  None. FINDINGS: Cardiac shadows within normal limits. Aortic calcifications are noted. Lungs are hyperinflated bilaterally. Rounded density is noted in the right base measuring proximally 6 cm. This may represent fluid within the major fissure although the possibility of underlying mass deserves consideration. CT of the chest with contrast is recommended for further evaluation. IMPRESSION: Rounded density in the right lung base suspicious for underlying mass. CT is recommended for further evaluation. Electronically Signed   By: Inez Catalina M.D.   On: 04/15/2020 07:36   VAS [place] LOWER EXTREMITY VENOUS (DVT)  Result Date: 04/16/2020  Lower Venous DVT Study Indications: Rapidly rising D-dimer & Covid+.  Comparison Study: No previous exams Performing Technologist: Rogelia Rohrer  Examination Guidelines: A complete evaluation includes B-mode imaging, spectral Doppler, color Doppler, and power Doppler as needed of all accessible portions of each vessel. Bilateral testing is considered an integral part of a complete examination. Limited examinations for reoccurring  indications may be performed as noted. The reflux portion of the exam is performed with the patient in reverse Trendelenburg.  +---------+---------------+---------+-----------+----------+--------------+ RIGHT    CompressibilityPhasicitySpontaneityPropertiesThrombus Aging +---------+---------------+---------+-----------+----------+--------------+ CFV      Full           Yes      Yes                                 +---------+---------------+---------+-----------+----------+--------------+ SFJ      Full                                                        +---------+---------------+---------+-----------+----------+--------------+ FV Prox  Full           Yes      Yes                                 +---------+---------------+---------+-----------+----------+--------------+ FV Mid   Full           Yes      Yes                                 +---------+---------------+---------+-----------+----------+--------------+ FV DistalFull           Yes      Yes                                 +---------+---------------+---------+-----------+----------+--------------+ PFV      Full                                                        +---------+---------------+---------+-----------+----------+--------------+ POP      Full           Yes      Yes                                 +---------+---------------+---------+-----------+----------+--------------+ PTV      Full                                                        +---------+---------------+---------+-----------+----------+--------------+ PERO     Full                                                        +---------+---------------+---------+-----------+----------+--------------+   +---------+---------------+---------+-----------+----------+--------------+  LEFT     CompressibilityPhasicitySpontaneityPropertiesThrombus Aging  +---------+---------------+---------+-----------+----------+--------------+ CFV      Full                                                        +---------+---------------+---------+-----------+----------+--------------+ SFJ      Full                                                        +---------+---------------+---------+-----------+----------+--------------+ FV Prox  Full           Yes      Yes                                 +---------+---------------+---------+-----------+----------+--------------+ FV Mid   Full           Yes      Yes                                 +---------+---------------+---------+-----------+----------+--------------+ FV DistalFull           Yes      Yes                                 +---------+---------------+---------+-----------+----------+--------------+ PFV      Full                                                        +---------+---------------+---------+-----------+----------+--------------+ POP      Full           Yes      Yes                                 +---------+---------------+---------+-----------+----------+--------------+ PTV      Full                                                        +---------+---------------+---------+-----------+----------+--------------+ PERO     Full                                                        +---------+---------------+---------+-----------+----------+--------------+     Summary: BILATERAL: - No evidence of deep vein thrombosis seen in the lower extremities, bilaterally. - No evidence of superficial venous thrombosis in the lower extremities, bilaterally. -No evidence of popliteal cyst, bilaterally.   *See table(s) above for measurements and observations. Electronically signed by Jamelle Haring on 04/16/2020 at 5:48:36 PM.  Final

## 2020-04-21 NOTE — TOC Progression Note (Signed)
Transition of Care [facility]) - Progression Note    Patient Details  Name: Elizabeth Chase MRN: [id number] Date of Birth: 1940/04/28  Transition of Care [facility]) CM/SW [place], LCSW Phone Number: 04/21/2020, 5:49 PM  Clinical Narrative:    CSW spoke with patient regarding referral to [place] hotel program. Patient stated that she did not want to go to a hotel without her daughter staying with her. CSW explained that if her daughter does not have COVID, she cannot stay with the patient but CSW would ask. CSW asked if she can go back to stay with her son. She stated that she could not and she'd rather stay in the car. She requested CSW call her daughter. CSW spoke with [name]. She stated that patient likely would not agree to stay in a hotel by herself and requested CSW ask if she can stay with her. CSW contacted [place] with the Covid hotel program. She stated that likely if the daughter is negative, they cannot accept her staying with the patient but requested CSW emailed the referral for her to review. CSW appreciates RN assistance in having patient sign the forms.    Expected Discharge Plan: Home/Self Care Barriers to Discharge: Continued Medical Work up,Homeless with medical needs  Expected Discharge Plan and Services Expected Discharge Plan: Home/Self Care In-house Referral: Clinical Social Work     Living arrangements for the past 2 months: Homeless (car)                                       Social Determinants of Health (SDOH) Interventions    Readmission Risk Interventions No flowsheet data found.

## 2020-04-22 ENCOUNTER — Telehealth: Payer: Self-pay | Admitting: Hematology and Oncology

## 2020-04-22 DIAGNOSIS — N63 Unspecified lump in unspecified breast: Secondary | ICD-10-CM | POA: Diagnosis not present

## 2020-04-22 LAB — SURGICAL PATHOLOGY

## 2020-04-22 NOTE — TOC Progression Note (Signed)
Transition of Care [facility]) - Progression Note    Patient Details  Name: Elizabeth Chase MRN: [id number] Date of Birth: 1940/10/30  Transition of Care [facility]) CM/SW [place], LCSW Phone Number: 04/22/2020, 9:43 AM  Clinical Narrative:    CSW asked patient to complete COVID hotel referral forms but patient is hesitant and wants to wait for her daughter to come to the hospital today. CSW explained it is only a referral and we cannot force patient to go to the hotel.    CSW spoke with Jackelyn Poling at [employer] [facility]. She stated they may can assist patient's daughter with rent if she can locate a property in [place]. CSW spoke with patient's daughter; she reported she is on the way to an appointment to see if she can get a mobile home. She will find out the address and contact [place], [place], to see if it qualifies.    Expected Discharge Plan: Home/Self Care Barriers to Discharge: Continued Medical Work up,Homeless with medical needs  Expected Discharge Plan and Services Expected Discharge Plan: Home/Self Care In-house Referral: Clinical Social Work     Living arrangements for the past 2 months: Homeless (car)                                       Social Determinants of Health (SDOH) Interventions    Readmission Risk Interventions No flowsheet data found.

## 2020-04-22 NOTE — Progress Notes (Signed)
PROGRESS NOTE                                                                                                                                                                                                             Patient Demographics:    Elizabeth Chase, is a 80 y.o. female, DOB - Jun 21, 1940, KGM:[id number]  Outpatient Primary MD for the patient is No primary care provider on file.    LOS - 7  Admit date - 04/14/2020    Chief Complaint  Patient presents with  . Breast Infection        Brief Narrative (HPI from H&P)   - Elizabeth Chase is a 80 y.o. female with medical history significant of hypertension and hyperlipidemia who presented episode of syncope where she stood up to walk and almost passed out, was caught by her daughter on her way down, lost consciousness for a few minutes with no bowel or bladder incontinence, in the ER she was found to be dehydrated and hypotensive, on exam she was found to have a fungating right breast mass, imaging suggested colitis along with right breast malignancy and she was admitted to the hospital.   Subjective:   Patient in bed, she denies any complaints, no chest pain, no shortness of breath, no fever or chills.     Assessment  & Plan :   Syncope due to hypotension.  - Caused by dehydration and possible mild colitis.  Currently no diarrhea, has been hydrated with IV fluids and is feeling better already, continue hydration, PT OT and monitor.  Head CT is nonacute and exam is nonfocal  Possible colitis.   -Currently no diarrhea.  Did with IV Flagyl and cephalosporin initially, currently off antibiotics  Breast cancer with fungating right breast mass with right axillary lymphadenopathy.  -History significant for invasive ductal carcinoma. -And oncology input greatly appreciated, awaiting further biopsy results as discussed with oncology(receptors), and likely will need chemo, then  resection followed by radiation.  Left wrist swelling appears to be a hematoma at the site of IV.  IV removed, stable X-Ray.  Much improved.  Fully vaccinated with incidental Covid infection.   - 3 days of remdesivir.  Elevated D-dimer.  - CTA lungs is negative.  Venous Dopplers is negative for DVT.   Poor social situation.  Homeless.  Social work  following.       Condition - Extremely Guarded  Family Communication  :  None at  Bedside, D/W daughter by phone.  Code Status :  Full  Consults  :  CCS, Onc  PUD Prophylaxis :    Procedures  :     Right breast excision biopsy by general surgery on 04/18/2020.    CT head - Non acute  CT Chest - Abd - Pelvis - No evidence of pulmonary embolism. Large RIGHT breast mass 8.1 cm greatest size most consistent with malignancy with associated RIGHT axillary adenopathy. Additional small RIGHT breast nodule 14 x 7 mm question mass versus intramammary node. COPD. Descending colitis. Sigmoid diverticulosis without evidence of diverticulitis. Infrarenal abdominal aortic aneurysm 4.2 cm in greatest axial dimensions      Disposition Plan  :    Status is: Inpatient  Remains inpatient appropriate because:IV treatments appropriate due to intensity of illness or inability to take PO   Dispo: The patient is from: homeless              Anticipated d/c is to: Patient is homeless, awaiting safe disposition plan              Patient currently is medically stable to d/c.   Difficult to place patient Yes  DVT Prophylaxis  :   Heparin - SCDs   Lab Results  Component Value Date   PLT 368 04/20/2020    Diet :  Diet Order            Diet regular Room service appropriate? Yes; Fluid consistency: Thin  Diet effective now                  Inpatient Medications  Scheduled Meds: . vitamin C  500 mg Oral Daily  . atorvastatin  20 mg Oral q1800  . fluticasone furoate-vilanterol  1 puff Inhalation Daily  . heparin injection (subcutaneous)   5,000 Units Subcutaneous Q8H  . lidocaine-EPINEPHrine  20 mL Intradermal Once  . sodium chloride flush  3 mL Intravenous Q12H  . zinc sulfate  220 mg Oral Daily   Continuous Infusions:  PRN Meds:.acetaminophen **OR** [DISCONTINUED] acetaminophen, albuterol, guaiFENesin-dextromethorphan, silver nitrate applicators, traZODone  Antibiotics  :    Anti-infectives (From admission, onward)   Start     Dose/Rate Route Frequency Ordered Stop   04/18/20 1400  metroNIDAZOLE (FLAGYL) tablet 500 mg  Status:  Discontinued        500 mg Oral Every 8 hours 04/18/20 1049 04/21/20 1403   04/16/20 1000  remdesivir 100 mg in sodium chloride 0.9 % 100 mL IVPB       "Followed by" Linked Group Details   100 mg 200 mL/hr over 30 Minutes Intravenous Daily 04/15/20 1203 04/17/20 1109   04/15/20 1915  metroNIDAZOLE (FLAGYL) IVPB 500 mg  Status:  Discontinued        500 mg 100 mL/hr over 60 Minutes Intravenous Every 8 hours 04/15/20 1822 04/18/20 1049   04/15/20 1630  ceFAZolin (ANCEF) IVPB 1 g/50 mL premix  Status:  Discontinued        1 g 100 mL/hr over 30 Minutes Intravenous Every 8 hours 04/15/20 1154 04/18/20 1049   04/15/20 1400  remdesivir 200 mg in sodium chloride 0.9% 250 mL IVPB       "Followed by" Linked Group Details   200 mg 580 mL/hr over 30 Minutes Intravenous Once 04/15/20 1203 04/15/20 2101   04/15/20 0745  ceFAZolin (ANCEF) IVPB 1 g/50  mL premix        1 g 100 mL/hr over 30 Minutes Intravenous  Once 04/15/20 0732 04/15/20 0902        [name] M.D on 04/22/2020 at 2:16 PM  To page go to [email or web address]   [facility] -  Office  [phone]    See all Orders from today for further details    Objective:   Vitals:   04/21/20 2142 04/22/20 0435 04/22/20 1152 04/22/20 1159  BP: (!) 172/87 136/60 (!) 146/77 117/67  Pulse: 80 73 76 81  Resp: 20 18 16 19   Temp: 98.2 F (36.8 C) 98.2 F (36.8 C) 98.6 F (37 C) 98.7 F (37.1 C)  TempSrc: Oral   Oral  SpO2: 96% 96%  98% 97%  Weight:      Height:        Wt Readings from Last 3 Encounters:  04/15/20 54.9 kg  03/01/17 (!) 558.7 kg  01/15/17 51.9 kg     Intake/Output Summary (Last 24 hours) at 04/22/2020 1416 Last data filed at 04/22/2020 0854 Gross per 24 hour  Intake 250 ml  Output --  Net 250 ml     Physical Exam  Awake Alert, Oriented X 3, extremely frail, no new F.N deficits, Normal affect Symmetrical Chest wall movement, Good air movement bilaterally, CTAB RRR,No Gallops,Rubs or new Murmurs, No Parasternal Heave +ve B.Sounds, Abd Soft, No tenderness, No rebound - guarding or rigidity. No Cyanosis, Clubbing or edema, No new Rash or bruise        Data Review:    CBC Recent Labs  Lab 04/16/20 0431 04/17/20 0423 04/18/20 0114 04/19/20 0010 04/20/20 0049  WBC 8.1 6.0 6.0 6.0 6.3  HGB 10.4* 9.9* 10.3* 10.2* 10.5*  HCT 31.8* 29.9* 32.2* 31.8* 31.2*  PLT 255 279 300 349 368  MCV 90.9 88.7 89.4 89.8 87.9  MCH 29.7 29.4 28.6 28.8 29.6  MCHC 32.7 33.1 32.0 32.1 33.7  RDW 13.7 13.6 13.2 13.2 13.3  LYMPHSABS  --  1.0 1.0 1.1 1.3  MONOABS  --  0.5 0.6 0.6 0.6  EOSABS  --  0.3 0.4 0.5 0.4  BASOSABS  --  0.0 0.0 0.0 0.1    Recent Labs  Lab 04/16/20 0431 04/17/20 0423 04/18/20 0114 04/19/20 0010 04/20/20 0049  NA 134* 138 137 138 138  K 3.9 3.9 3.6 3.7 4.2  CL 107 105 104 105 106  CO2 20* 25 25 25  21*  GLUCOSE 113* 102* 114* 127* 110*  BUN 13 11 14 12 13   CREATININE 0.79 0.67 0.73 0.64 0.75  CALCIUM 7.8* 8.1* 8.2* 8.4* 8.4*  AST  --  17 17 23 24   ALT  --  9 9 13 11   ALKPHOS  --  39 39 41 43  BILITOT  --  0.7 0.4 0.4 0.5  ALBUMIN  --  2.4* 2.4* 2.6* 2.7*  MG  --  1.7 1.7 1.8 1.8  CRP  --  10.5* 7.7* 4.3* 2.2*  DDIMER  --  2.55* 2.31* 1.94* 1.85*  BNP  --  67.9 79.9 48.3 35.4    ------------------------------------------------------------------------------------------------------------------ No results for input(s): CHOL, HDL, LDLCALC, TRIG, CHOLHDL, LDLDIRECT in  the last 72 hours.  Lab Results  Component Value Date   HGBA1C 5.9 (H) 01/02/2017   ------------------------------------------------------------------------------------------------------------------ No results for input(s): TSH, T4TOTAL, T3FREE, THYROIDAB in the last 72 hours.  Invalid input(s): FREET3  Cardiac Enzymes No results for input(s): CKMB, TROPONINI, MYOGLOBIN in the last 168 hours.  Invalid input(s): CK ------------------------------------------------------------------------------------------------------------------    Component Value Date/Time   BNP 35.4 04/20/2020 0049    Micro Results Recent Results (from the past 240 hour(s))  Culture, blood (routine x 2)     Status: None   Collection Time: 04/15/20  7:42 AM   Specimen: BLOOD  Result Value Ref Range Status   Specimen Description BLOOD LEFT ANTECUBITAL  Final   Special Requests   Final    BOTTLES DRAWN AEROBIC AND ANAEROBIC Blood Culture adequate volume   Culture   Final    NO GROWTH 5 DAYS Performed at [facility], [address], [place], [place] [address]    Report Status 04/20/2020 FINAL  Final  Resp Panel by RT-PCR (Flu A&B, Covid) Nasopharyngeal Swab     Status: Abnormal   Collection Time: 04/15/20  7:42 AM   Specimen: Nasopharyngeal Swab; Nasopharyngeal(NP) swabs in vial transport medium  Result Value Ref Range Status   SARS Coronavirus 2 by RT PCR POSITIVE (A) NEGATIVE Final    Comment: RESULT CALLED TO, READ BACK BY AND VERIFIED WITH: RN S.BETRAND ON 04/15/2020 AT 0926 BY E.PARRISH (NOTE) SARS-CoV-2 target nucleic acids are DETECTED.  The SARS-CoV-2 RNA is generally detectable in upper respiratory specimens during the acute phase of infection. Positive results are indicative of the presence of the identified virus, but do not rule out bacterial infection or co-infection with other pathogens not detected by the test. Clinical correlation with patient history and other diagnostic information  is necessary to determine patient infection status. The expected result is Negative.  Fact Sheet for Patients: EntrepreneurPulse.com.au  Fact Sheet for Healthcare Providers: IncredibleEmployment.be  This test is not yet approved or cleared by the [place] FDA and  has been authorized for detection and/or diagnosis of SARS-CoV-2 by FDA under an Emergency Use Authorization (EUA).  This EUA will remain in effect (meaning this  test can be used) for the duration of  the COVID-19 declaration under Section 564(b)(1) of the Act, 21 U.S.C. section 360bbb-3(b)(1), unless the authorization is terminated or revoked sooner.     Influenza A by PCR NEGATIVE NEGATIVE Final   Influenza B by PCR NEGATIVE NEGATIVE Final    Comment: (NOTE) The Xpert Xpress SARS-CoV-2/FLU/RSV plus assay is intended as an aid in the diagnosis of influenza from Nasopharyngeal swab specimens and should not be used as a sole basis for treatment. Nasal washings and aspirates are unacceptable for Xpert Xpress SARS-CoV-2/FLU/RSV testing.  Fact Sheet for Patients: EntrepreneurPulse.com.au  Fact Sheet for Healthcare Providers: IncredibleEmployment.be  This test is not yet approved or cleared by the [place] FDA and has been authorized for detection and/or diagnosis of SARS-CoV-2 by FDA under an Emergency Use Authorization (EUA). This EUA will remain in effect (meaning this test can be used) for the duration of the COVID-19 declaration under Section 564(b)(1) of the Act, 21 U.S.C. section 360bbb-3(b)(1), unless the authorization is terminated or revoked.  Performed at [facility], [place] [address], [place], [place] [address]   Culture, blood (routine x 2)     Status: None   Collection Time: 04/15/20  7:43 AM   Specimen: BLOOD RIGHT HAND  Result Value Ref Range Status   Specimen Description BLOOD RIGHT HAND  Final   Special  Requests   Final    BOTTLES DRAWN AEROBIC ONLY Blood Culture results may not be optimal due to an inadequate volume of blood received in culture bottles   Culture   Final  NO GROWTH 5 DAYS Performed at [facility], [place] [address], [place], [place] [address]    Report Status 04/20/2020 FINAL  Final    Radiology Reports DG Wrist Complete Left  Result Date: 04/16/2020 CLINICAL DATA:  Left wrist swelling. EXAM: LEFT WRIST - COMPLETE 3+ VIEW COMPARISON:  None. FINDINGS: Degenerative changes between the base of the first metacarpal and trapezium. Degenerative changes between the trapezium and scaphoid. Soft tissue swelling best seen on the lateral view. No fractures or bony erosions identified. IMPRESSION: Soft tissue swelling.  Degenerative changes as above. Electronically Signed   By: Dorise Bullion III M.D   On: 04/16/2020 12:33   CT Head Wo Contrast  Result Date: 04/15/2020 CLINICAL DATA:  Syncope with normal neuro exam EXAM: CT HEAD WITHOUT CONTRAST TECHNIQUE: Contiguous axial images were obtained from the base of the skull through the vertex without intravenous contrast. COMPARISON:  Brain MRI 01/02/2017 FINDINGS: Brain: No evidence of acute infarction, hemorrhage, hydrocephalus, extra-axial collection or mass effect. 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 Stable calcification in the right posterior frontal white matter age congruent cerebral volume loss. Known meningioma at the right vertex again measuring 9 mm. Vascular: No hyperdense vessel or unexpected calcification. Skull: Normal. Negative for fracture or focal lesion. Sinuses/Orbits: No acute finding. IMPRESSION: No acute finding or change from 2018 Electronically Signed   By: Monte Fantasia M.D.   On: 04/15/2020 08:42   CT Angio Chest PE W and/or Wo Contrast  Result Date: 04/15/2020 CLINICAL DATA:  Abdominal pain, nonlocalized, weakness, fatigue, chronic RIGHT breast infection for years  drainage EXAM: CT ANGIOGRAPHY CHEST CT ABDOMEN AND PELVIS WITH CONTRAST TECHNIQUE: Multidetector CT imaging of the chest was performed using the standard protocol during bolus administration of intravenous contrast. Multiplanar CT image reconstructions and MIPs were obtained to evaluate the vascular anatomy. Multidetector CT imaging of the abdomen and pelvis was performed using the standard protocol during bolus administration of intravenous contrast. CONTRAST:  178mL OMNIPAQUE IOHEXOL 350 MG/ML SOLN IV. No oral contrast. COMPARISON:  None FINDINGS: CTA CHEST FINDINGS Cardiovascular: Atherosclerotic calcifications aorta, coronary arteries, and proximal great vessels. Aorta normal caliber without aneurysm or dissection. Heart unremarkable. No pericardial effusion. Pulmonary arteries adequately opacified and patent. No evidence of pulmonary embolism. Mediastinum/Nodes: Base of cervical region normal appearance. Esophagus unremarkable. Large lobulated soft tissue mass identified at RIGHT breast extending through skin surface, 8.1 x 4.9 x 7.1 cm in size most consistent with malignancy rather than infection. Mass extends to the chest wall with loss of fat plane between the mass and the underlying pectoralis muscle. Additional small nodule within RIGHT breast 14 x 7 mm question second mass versus lymph node. RIGHT axillary adenopathy with nodes measuring up to 18 mm short axis. Normal sized mediastinal lymph nodes. Lungs/Pleura: Emphysematous changes. Dependent atelectasis. Lungs otherwise clear. No pulmonary infiltrate, pleural effusion, or pneumothorax. No pulmonary mass/nodule. Musculoskeletal: No acute osseous findings. Review of the MIP images confirms the above findings. CT ABDOMEN and PELVIS FINDINGS Hepatobiliary: Gallbladder and liver normal appearance Pancreas: Atrophic without focal mass Spleen: Normal appearance Adrenals/Urinary Tract: Cortical scarring at inferior pole LEFT kidney. Adrenal glands, kidneys,  ureters, and bladder otherwise normal appearance Stomach/Bowel: Appendix not visualized, no pericecal inflammatory process seen. Stomach decompressed. Bowel wall thickening of descending colon with mild surrounding infiltrative changes over a long length favoring descending colitis. Minimal thickening of LEFT lateral conal fascia. Sigmoid diverticulosis without evidence of diverticulitis. Remaining bowel loops unremarkable. Vascular/Lymphatic: Atherosclerotic  calcifications aorta, iliac arteries, visceral arteries. Infrarenal abdominal aortic aneurysm 4.2 x 4.0 cm in greatest axial dimensions extending 6.6 cm length, terminating above bifurcation. Moderate thrombus. No perianeurysmal infiltration/hemorrhage. No adenopathy. Reproductive: Atrophic uterus with unremarkable adnexa Other: No free air or free fluid.  No hernia. Musculoskeletal: Osseous demineralization. Review of the MIP images confirms the above findings. IMPRESSION: No evidence of pulmonary embolism. Large RIGHT breast mass 8.1 cm greatest size most consistent with malignancy with associated RIGHT axillary adenopathy. Additional small RIGHT breast nodule 14 x 7 mm question mass versus intramammary node. COPD. Descending colitis. Sigmoid diverticulosis without evidence of diverticulitis. Infrarenal abdominal aortic aneurysm 4.2 cm in greatest axial dimensions; Recommend follow-up every 12 months and vascular consultation. This recommendation follows ACR consensus guidelines: White Paper of the ACR Incidental Findings Committee II on Vascular Findings. J Am Coll Radiol 2013; 10:789-794. Emphysema (ICD10-J43.9). Aortic Atherosclerosis (ICD10-I70.0). Aortic aneurysm NOS (ICD10-I71.9).: Findings called to Dr.  Francia Greaves on 04/15/2020 at 0857 hours. Electronically Signed   By: Lavonia Dana M.D.   On: 04/15/2020 08:58   CT ABDOMEN PELVIS W CONTRAST  Result Date: 04/15/2020 CLINICAL DATA:  Abdominal pain, nonlocalized, weakness, fatigue, chronic RIGHT breast  infection for years drainage EXAM: CT ANGIOGRAPHY CHEST CT ABDOMEN AND PELVIS WITH CONTRAST TECHNIQUE: Multidetector CT imaging of the chest was performed using the standard protocol during bolus administration of intravenous contrast. Multiplanar CT image reconstructions and MIPs were obtained to evaluate the vascular anatomy. Multidetector CT imaging of the abdomen and pelvis was performed using the standard protocol during bolus administration of intravenous contrast. CONTRAST:  157mL OMNIPAQUE IOHEXOL 350 MG/ML SOLN IV. No oral contrast. COMPARISON:  None FINDINGS: CTA CHEST FINDINGS Cardiovascular: Atherosclerotic calcifications aorta, coronary arteries, and proximal great vessels. Aorta normal caliber without aneurysm or dissection. Heart unremarkable. No pericardial effusion. Pulmonary arteries adequately opacified and patent. No evidence of pulmonary embolism. Mediastinum/Nodes: Base of cervical region normal appearance. Esophagus unremarkable. Large lobulated soft tissue mass identified at RIGHT breast extending through skin surface, 8.1 x 4.9 x 7.1 cm in size most consistent with malignancy rather than infection. Mass extends to the chest wall with loss of fat plane between the mass and the underlying pectoralis muscle. Additional small nodule within RIGHT breast 14 x 7 mm question second mass versus lymph node. RIGHT axillary adenopathy with nodes measuring up to 18 mm short axis. Normal sized mediastinal lymph nodes. Lungs/Pleura: Emphysematous changes. Dependent atelectasis. Lungs otherwise clear. No pulmonary infiltrate, pleural effusion, or pneumothorax. No pulmonary mass/nodule. Musculoskeletal: No acute osseous findings. Review of the MIP images confirms the above findings. CT ABDOMEN and PELVIS FINDINGS Hepatobiliary: Gallbladder and liver normal appearance Pancreas: Atrophic without focal mass Spleen: Normal appearance Adrenals/Urinary Tract: Cortical scarring at inferior pole LEFT kidney. Adrenal  glands, kidneys, ureters, and bladder otherwise normal appearance Stomach/Bowel: Appendix not visualized, no pericecal inflammatory process seen. Stomach decompressed. Bowel wall thickening of descending colon with mild surrounding infiltrative changes over a long length favoring descending colitis. Minimal thickening of LEFT lateral conal fascia. Sigmoid diverticulosis without evidence of diverticulitis. Remaining bowel loops unremarkable. Vascular/Lymphatic: Atherosclerotic calcifications aorta, iliac arteries, visceral arteries. Infrarenal abdominal aortic aneurysm 4.2 x 4.0 cm in greatest axial dimensions extending 6.6 cm length, terminating above bifurcation. Moderate thrombus. No perianeurysmal infiltration/hemorrhage. No adenopathy. Reproductive: Atrophic uterus with unremarkable adnexa Other: No free air or free fluid.  No hernia. Musculoskeletal: Osseous demineralization. Review of the MIP images confirms the above findings. IMPRESSION: No evidence of pulmonary embolism. Large RIGHT breast mass  8.1 cm greatest size most consistent with malignancy with associated RIGHT axillary adenopathy. Additional small RIGHT breast nodule 14 x 7 mm question mass versus intramammary node. COPD. Descending colitis. Sigmoid diverticulosis without evidence of diverticulitis. Infrarenal abdominal aortic aneurysm 4.2 cm in greatest axial dimensions; Recommend follow-up every 12 months and vascular consultation. This recommendation follows ACR consensus guidelines: White Paper of the ACR Incidental Findings Committee II on Vascular Findings. J Am Coll Radiol 2013; 10:789-794. Emphysema (ICD10-J43.9). Aortic Atherosclerosis (ICD10-I70.0). Aortic aneurysm NOS (ICD10-I71.9).: Findings called to Dr.  Francia Greaves on 04/15/2020 at 0857 hours. Electronically Signed   By: Lavonia Dana M.D.   On: 04/15/2020 08:58   DG Chest Port 1 View  Result Date: 04/15/2020 CLINICAL DATA:  Recent syncopal episode EXAM: PORTABLE CHEST 1 VIEW COMPARISON:   None. FINDINGS: Cardiac shadows within normal limits. Aortic calcifications are noted. Lungs are hyperinflated bilaterally. Rounded density is noted in the right base measuring proximally 6 cm. This may represent fluid within the major fissure although the possibility of underlying mass deserves consideration. CT of the chest with contrast is recommended for further evaluation. IMPRESSION: Rounded density in the right lung base suspicious for underlying mass. CT is recommended for further evaluation. Electronically Signed   By: Inez Catalina M.D.   On: 04/15/2020 07:36   VAS [place] LOWER EXTREMITY VENOUS (DVT)  Result Date: 04/16/2020  Lower Venous DVT Study Indications: Rapidly rising D-dimer & Covid+.  Comparison Study: No previous exams Performing Technologist: Rogelia Rohrer  Examination Guidelines: A complete evaluation includes B-mode imaging, spectral Doppler, color Doppler, and power Doppler as needed of all accessible portions of each vessel. Bilateral testing is considered an integral part of a complete examination. Limited examinations for reoccurring indications may be performed as noted. The reflux portion of the exam is performed with the patient in reverse Trendelenburg.  +---------+---------------+---------+-----------+----------+--------------+ RIGHT    CompressibilityPhasicitySpontaneityPropertiesThrombus Aging +---------+---------------+---------+-----------+----------+--------------+ CFV      Full           Yes      Yes                                 +---------+---------------+---------+-----------+----------+--------------+ SFJ      Full                                                        +---------+---------------+---------+-----------+----------+--------------+ FV Prox  Full           Yes      Yes                                 +---------+---------------+---------+-----------+----------+--------------+ FV Mid   Full           Yes      Yes                                  +---------+---------------+---------+-----------+----------+--------------+ FV DistalFull           Yes      Yes                                 +---------+---------------+---------+-----------+----------+--------------+  PFV      Full                                                        +---------+---------------+---------+-----------+----------+--------------+ POP      Full           Yes      Yes                                 +---------+---------------+---------+-----------+----------+--------------+ PTV      Full                                                        +---------+---------------+---------+-----------+----------+--------------+ PERO     Full                                                        +---------+---------------+---------+-----------+----------+--------------+   +---------+---------------+---------+-----------+----------+--------------+ LEFT     CompressibilityPhasicitySpontaneityPropertiesThrombus Aging +---------+---------------+---------+-----------+----------+--------------+ CFV      Full                                                        +---------+---------------+---------+-----------+----------+--------------+ SFJ      Full                                                        +---------+---------------+---------+-----------+----------+--------------+ FV Prox  Full           Yes      Yes                                 +---------+---------------+---------+-----------+----------+--------------+ FV Mid   Full           Yes      Yes                                 +---------+---------------+---------+-----------+----------+--------------+ FV DistalFull           Yes      Yes                                 +---------+---------------+---------+-----------+----------+--------------+ PFV      Full                                                         +---------+---------------+---------+-----------+----------+--------------+  POP      Full           Yes      Yes                                 +---------+---------------+---------+-----------+----------+--------------+ PTV      Full                                                        +---------+---------------+---------+-----------+----------+--------------+ PERO     Full                                                        +---------+---------------+---------+-----------+----------+--------------+     Summary: BILATERAL: - No evidence of deep vein thrombosis seen in the lower extremities, bilaterally. - No evidence of superficial venous thrombosis in the lower extremities, bilaterally. -No evidence of popliteal cyst, bilaterally.   *See table(s) above for measurements and observations. Electronically signed by Jamelle Haring on 04/16/2020 at 5:48:36 PM.    Final

## 2020-04-22 NOTE — Progress Notes (Signed)
Physical Therapy Discharge Patient Details Name: NORMA MONTEMURRO MRN: [id number] DOB: 07/25/1940 Today's Date: 04/22/2020 Time: 1450-1505 PT Time Calculation (min) (ACUTE ONLY): 15 min  Patient discharged from PT services secondary to goals met and no further PT needs identified.  Please see latest therapy progress note for current level of functioning and progress toward goals.    Progress and discharge plan discussed with patient and/or caregiver: Patient/Caregiver agrees with plan  GP    Dani Gobble. Migdalia Dk PT, DPT Acute Rehabilitation Services Pager [phone] Office [phone]  [place] 04/22/2020, 3:45 PM

## 2020-04-22 NOTE — Telephone Encounter (Signed)
Scheduled follow-up appointment per 3/11 schedule message. Patient is aware.

## 2020-04-22 NOTE — Progress Notes (Signed)
Physical Therapy Treatment and Discharge Patient Details Name: Elizabeth Chase MRN: [id number] DOB: [birth year] Today's Date: 04/22/2020    History of Present Illness Elizabeth Chase is a 80 y.o. female with medical history significant of hypertension and hyperlipidemia who presented episode of syncope where she stood up to walk and almost passed out, was caught by her daughter on her way down, lost consciousness for a few minutes with no bowel or bladder incontinence, in the ER she was found to be dehydrated and hypotensive, on exam she was found to have a fungating right breast mass, imaging suggested colitis along with right breast malignancy and she was admitted to the hospital; covid +    PT Comments    Pt sitting on EoB, transfers independently and is able to ambulate 500 feet independently with only mild SoB.  Pt has met all of her goals. D/c plans remain appropriate at this time. PT is discharging from caseload.   Follow Up Recommendations  No PT follow up;Supervision - Intermittent;Other (comment) (Will need support in the pursuit of her cancer care)     Equipment Recommendations  None recommended by PT       Precautions / Restrictions Precautions Precautions: Other (comment) Precaution Comments: Covd    Mobility  Bed Mobility Overal bed mobility: Independent                  Transfers Overall transfer level: Independent                  Ambulation/Gait Ambulation/Gait assistance: Independent Gait Distance (Feet): 500 Feet Assistive device: None Gait Pattern/deviations: Step-through pattern;WFL(Within Functional Limits) Gait velocity: slowed Gait velocity interpretation: 1.31 - 2.62 ft/sec, indicative of limited community ambulator General Gait Details: strong, steady gait         Balance Overall balance assessment: Mild deficits observed, not formally tested                                          Cognition Arousal/Alertness:  Awake/alert Behavior During Therapy: WFL for tasks assessed/performed Overall Cognitive Status: Within Functional Limits for tasks assessed                                           General Comments General comments (skin integrity, edema, etc.): vss      Pertinent Vitals/Pain Pain Assessment: No/denies pain Faces Pain Scale: No hurt           PT Goals (current goals can now be found in the care plan section) Acute Rehab PT Goals Patient Stated Goal: Very concerned about getting housing PT Goal Formulation: With patient Time For Goal Achievement: 05/01/20 Potential to Achieve Goals: Good    Frequency    Min 3X/week      PT Plan Current plan remains appropriate       AM-PAC PT "6 Clicks" Mobility   Outcome Measure  Help needed turning from your back to your side while in a flat bed without using bedrails?: None Help needed moving from lying on your back to sitting on the side of a flat bed without using bedrails?: None Help needed moving to and from a bed to a chair (including a wheelchair)?: None Help needed standing up from a chair using your arms (e.g., wheelchair  or bedside chair)?: None Help needed to walk in hospital room?: None Help needed climbing 3-5 steps with a railing? : None 6 Click Score: 24    End of Session   Activity Tolerance: Patient tolerated treatment well Patient left: in bed;with call bell/phone within reach Nurse Communication: Mobility status PT Visit Diagnosis: Muscle weakness (generalized) (M62.81)     Time: 1450-1505 PT Time Calculation (min) (ACUTE ONLY): 15 min  Charges:  $Therapeutic Exercise: 8-22 mins                     Elizabeth B. Van Fleet PT, DPT Acute Rehabilitation Services Pager [phone] Office [phone]    Elizabeth B Van Fleet 04/22/2020, 3:41 PM   

## 2020-04-23 ENCOUNTER — Inpatient Hospital Stay (HOSPITAL_COMMUNITY): Payer: Medicare HMO

## 2020-04-23 DIAGNOSIS — R55 Syncope and collapse: Secondary | ICD-10-CM | POA: Diagnosis not present

## 2020-04-23 DIAGNOSIS — Z0189 Encounter for other specified special examinations: Secondary | ICD-10-CM | POA: Diagnosis not present

## 2020-04-23 DIAGNOSIS — N6310 Unspecified lump in the right breast, unspecified quadrant: Secondary | ICD-10-CM | POA: Diagnosis not present

## 2020-04-23 DIAGNOSIS — I10 Essential (primary) hypertension: Secondary | ICD-10-CM

## 2020-04-23 LAB — ECHOCARDIOGRAM LIMITED
Height: 65 in
Weight: 1936 oz

## 2020-04-23 NOTE — Progress Notes (Signed)
Pathology Reviewed: The biopsy revealed Breast cancer that is ER positive, PR Positive and Her 2 Positive. CT scans do not show any distant metastatic disease. The standard of care will be systemic chemo followed by surgery followed by Radiation and then anti-estrogen therapy.   Patient will need a port placement, ECHO prior to starting chemo (it would help if they can be done while the patient is in house). If not, we will arrange as out patient.  -Plan: TCHP q 3 weeks X 6 cycles and then surgery. (taxotere, carboplatin, herceptin and perjeta) - I am also concerned about her ability to tolerate chemo given her age and health status. - Will discuss the entire plan with the patient today.

## 2020-04-23 NOTE — Progress Notes (Signed)
HEMATOLOGY-ONCOLOGY PROGRESS NOTE  SUBJECTIVE: Patient appears to be comfortable in no acute distress.  There are lots of attempts to figure out a place for her to live.  OBJECTIVE: REVIEW OF SYSTEMS:   Fungating breast tumor  PHYSICAL EXAMINATION: ECOG PERFORMANCE STATUS: 1 - Symptomatic but completely ambulatory  Vitals:   04/22/20 2116 04/23/20 0527  BP: (!) 166/72 123/78  Pulse: 72 73  Resp: 15 19  Temp: 98.5 F (36.9 C) 98.7 F (37.1 C)  SpO2: 96% 97%   Filed Weights   04/15/20 0725  Weight: 121 lb (54.9 kg)     LABORATORY DATA:  I have reviewed the data as listed CMP Latest Ref Rng & Units 04/20/2020 04/19/2020 04/18/2020  Glucose 70 - 99 mg/dL 110(H) 127(H) 114(H)  BUN 8 - 23 mg/dL $Remove'13 12 14  'yphMmxp$ Creatinine 0.44 - 1.00 mg/dL 0.75 0.64 0.73  Sodium 135 - 145 mmol/L 138 138 137  Potassium 3.5 - 5.1 mmol/L 4.2 3.7 3.6  Chloride 98 - 111 mmol/L 106 105 104  CO2 22 - 32 mmol/L 21(L) 25 25  Calcium 8.9 - 10.3 mg/dL 8.4(L) 8.4(L) 8.2(L)  Total Protein 6.5 - 8.1 g/dL 5.6(L) 5.3(L) 5.1(L)  Total Bilirubin 0.3 - 1.2 mg/dL 0.5 0.4 0.4  Alkaline Phos 38 - 126 U/L 43 41 39  AST 15 - 41 U/L $Remo'24 23 17  'NHDvz$ ALT 0 - 44 U/L $Remo'11 13 9    'LdAxT$ Lab Results  Component Value Date   WBC 6.3 04/20/2020   HGB 10.5 (L) 04/20/2020   HCT 31.2 (L) 04/20/2020   MCV 87.9 04/20/2020   PLT 368 04/20/2020   NEUTROABS 3.9 04/20/2020    ASSESSMENT AND PLAN: 1.  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 Systemic chemotherapy with Taxotere Herceptin and Perjeta every 3 weeks x6 cycles versus palliative therapy with antiestrogens. She definitely wants to try the treatment that can potentially cure her. Therefore the plan is to do outpatient chemotherapy with Taxotere Herceptin and Perjeta.  We will have to very carefully dose her treatment so that she can tolerate it.  I explained to her that the risks of chemotherapy could  include nausea, hair loss, diarrhea, cytopenias, cardiac function abnormalities, LFT changes or sometimes even hospitalization and death.  Plan: 1.  Port placement 2. Echocardiogram  Once she gets discharged we will plan to bring her in to start chemotherapy. Treatment plan: 1.  Neoadjuvant chemotherapy with Taxotere Herceptin and Perjeta every 3 weeks x6 2. mastectomy with targeted lymph node dissection 3.  Adjuvant radiation therapy 4.  Follow-up adjuvant antiestrogen therapy  Return to clinic to start her treatment upon discharge.  She will need to go through chemo class after discharge. Our cancer center also request 21 days of quarantine before she can be seen in the cancer center.  If necessary I will call the patient and her daughter to further go over the chemotherapy risks and benefits with a virtual visit.

## 2020-04-23 NOTE — Progress Notes (Signed)
PROGRESS NOTE                                                                                                                                                                                                             Patient Demographics:    Elizabeth Chase, is a 80 y.o. female, DOB - 11-15-1940, PRF:[id number]  Outpatient Primary MD for the patient is No primary care provider on file.    LOS - 8  Admit date - 04/14/2020    Chief Complaint  Patient presents with  . Breast Infection        Brief Narrative (HPI from H&P)    - Elizabeth Chase is a 80 y.o. female with medical history significant of hypertension and hyperlipidemia who presented episode of syncope where she stood up to walk and almost passed out, was caught by her daughter on her way down, lost consciousness for a few minutes with no bowel or bladder incontinence, in the ER she was found to be dehydrated and hypotensive, on exam she was found to have a fungating right breast mass, imaging suggested colitis along with right breast malignancy and she was admitted to the hospital.   Subjective:   Patient in bed, she denies any complaints, no chest pain, no shortness of breath, no fever or chills.     Assessment  & Plan :   Syncope due to hypotension.  - Caused by dehydration and possible mild colitis.  Currently no diarrhea, has been hydrated with IV fluids and is feeling better already, continue hydration, PT OT and monitor.  Head CT is nonacute and exam is nonfocal   colitis.   -resolved  Did with IV Flagyl and cephalosporin initially, currently off antibiotics  Breast cancer with fungating right breast mass with right axillary lymphadenopathy.  -History significant for invasive ductal carcinoma. -I have discussed with oncology, patient will need chemotherapy, then followed by surgery and radiation, . -We will obtain 2D echo, IR consulted for port placement  .  Left wrist swelling appears to be a hematoma at the site of IV.  IV removed, stable X-Ray.  Much improved.  Fully vaccinated with incidental Covid infection.   - 3 days of remdesivir.  Elevated D-dimer.  - CTA lungs is negative.  Venous Dopplers is negative for DVT.   Poor social situation.  Homeless.  Social work following.       Condition - Extremely Guarded  Family Communication  :  None at  Bedside, D/W daughter by phone.  Code Status :  Full  Consults  :  CCS, Onc  PUD Prophylaxis :    Procedures  :     Right breast excision biopsy by general surgery on 04/18/2020.    CT head - Non acute  CT Chest - Abd - Pelvis - No evidence of pulmonary embolism. Large RIGHT breast mass 8.1 cm greatest size most consistent with malignancy with associated RIGHT axillary adenopathy. Additional small RIGHT breast nodule 14 x 7 mm question mass versus intramammary node. COPD. Descending colitis. Sigmoid diverticulosis without evidence of diverticulitis. Infrarenal abdominal aortic aneurysm 4.2 cm in greatest axial dimensions      Disposition Plan  :    Status is: Inpatient  Remains inpatient appropriate because:IV treatments appropriate due to intensity of illness or inability to take PO   Dispo: The patient is from: homeless              Anticipated d/c is to: Patient is homeless, awaiting safe disposition plan              Patient currently is medically stable to d/c.   Difficult to place patient Yes  DVT Prophylaxis  :   Heparin - SCDs   Lab Results  Component Value Date   PLT 368 04/20/2020    Diet :  Diet Order            Diet regular Room service appropriate? Yes; Fluid consistency: Thin  Diet effective now                  Inpatient Medications  Scheduled Meds: . vitamin C  500 mg Oral Daily  . atorvastatin  20 mg Oral q1800  . fluticasone furoate-vilanterol  1 puff Inhalation Daily  . heparin injection (subcutaneous)  5,000 Units Subcutaneous Q8H  .  lidocaine-EPINEPHrine  20 mL Intradermal Once  . sodium chloride flush  3 mL Intravenous Q12H  . zinc sulfate  220 mg Oral Daily   Continuous Infusions:  PRN Meds:.acetaminophen **OR** [DISCONTINUED] acetaminophen, albuterol, guaiFENesin-dextromethorphan, silver nitrate applicators, traZODone  Antibiotics  :    Anti-infectives (From admission, onward)   Start     Dose/Rate Route Frequency Ordered Stop   04/18/20 1400  metroNIDAZOLE (FLAGYL) tablet 500 mg  Status:  Discontinued        500 mg Oral Every 8 hours 04/18/20 1049 04/21/20 1403   04/16/20 1000  remdesivir 100 mg in sodium chloride 0.9 % 100 mL IVPB       "Followed by" Linked Group Details   100 mg 200 mL/hr over 30 Minutes Intravenous Daily 04/15/20 1203 04/17/20 1109   04/15/20 1915  metroNIDAZOLE (FLAGYL) IVPB 500 mg  Status:  Discontinued        500 mg 100 mL/hr over 60 Minutes Intravenous Every 8 hours 04/15/20 1822 04/18/20 1049   04/15/20 1630  ceFAZolin (ANCEF) IVPB 1 g/50 mL premix  Status:  Discontinued        1 g 100 mL/hr over 30 Minutes Intravenous Every 8 hours 04/15/20 1154 04/18/20 1049   04/15/20 1400  remdesivir 200 mg in sodium chloride 0.9% 250 mL IVPB       "Followed by" Linked Group Details   200 mg 580 mL/hr over 30 Minutes Intravenous Once 04/15/20 1203 04/15/20 2101   04/15/20 0745  ceFAZolin (ANCEF) IVPB  1 g/50 mL premix        1 g 100 mL/hr over 30 Minutes Intravenous  Once 04/15/20 0732 04/15/20 0902        Silvino Selman M.D on 04/23/2020 at 12:27 PM  To page go to [email or web address]   [facility] -  Office  [phone]    See all Orders from today for further details    Objective:   Vitals:   04/22/20 1152 04/22/20 1159 04/22/20 2116 04/23/20 0527  BP: (!) 146/77 117/67 (!) 166/72 123/78  Pulse: 76 81 72 73  Resp: 16 19 15 19   Temp: 98.6 F (37 C) 98.7 F (37.1 C) 98.5 F (36.9 C) 98.7 F (37.1 C)  TempSrc:  Oral Axillary Axillary  SpO2: 98% 97% 96% 97%  Weight:       Height:        Wt Readings from Last 3 Encounters:  04/15/20 54.9 kg  03/01/17 (!) 558.7 kg  01/15/17 51.9 kg     Intake/Output Summary (Last 24 hours) at 04/23/2020 1227 Last data filed at 04/23/2020 5093 Gross per 24 hour  Intake 960 ml  Output --  Net 960 ml     Physical Exam  Awake Alert, Oriented X 3, No new F.N deficits, Normal affect Symmetrical Chest wall movement, Good air movement bilaterally, CTAB RRR,No Gallops,Rubs or new Murmurs, No Parasternal Heave +ve B.Sounds, Abd Soft, No tenderness, No rebound - guarding or rigidity. No Cyanosis, Clubbing or edema, No new Rash or bruise        Data Review:    CBC Recent Labs  Lab 04/17/20 0423 04/18/20 0114 04/19/20 0010 04/20/20 0049  WBC 6.0 6.0 6.0 6.3  HGB 9.9* 10.3* 10.2* 10.5*  HCT 29.9* 32.2* 31.8* 31.2*  PLT 279 300 349 368  MCV 88.7 89.4 89.8 87.9  MCH 29.4 28.6 28.8 29.6  MCHC 33.1 32.0 32.1 33.7  RDW 13.6 13.2 13.2 13.3  LYMPHSABS 1.0 1.0 1.1 1.3  MONOABS 0.5 0.6 0.6 0.6  EOSABS 0.3 0.4 0.5 0.4  BASOSABS 0.0 0.0 0.0 0.1    Recent Labs  Lab 04/17/20 0423 04/18/20 0114 04/19/20 0010 04/20/20 0049  NA 138 137 138 138  K 3.9 3.6 3.7 4.2  CL 105 104 105 106  CO2 25 25 25  21*  GLUCOSE 102* 114* 127* 110*  BUN 11 14 12 13   CREATININE 0.67 0.73 0.64 0.75  CALCIUM 8.1* 8.2* 8.4* 8.4*  AST 17 17 23 24   ALT 9 9 13 11   ALKPHOS 39 39 41 43  BILITOT 0.7 0.4 0.4 0.5  ALBUMIN 2.4* 2.4* 2.6* 2.7*  MG 1.7 1.7 1.8 1.8  CRP 10.5* 7.7* 4.3* 2.2*  DDIMER 2.55* 2.31* 1.94* 1.85*  BNP 67.9 79.9 48.3 35.4    ------------------------------------------------------------------------------------------------------------------ No results for input(s): CHOL, HDL, LDLCALC, TRIG, CHOLHDL, LDLDIRECT in the last 72 hours.  Lab Results  Component Value Date   HGBA1C 5.9 (H) 01/02/2017   ------------------------------------------------------------------------------------------------------------------ No  results for input(s): TSH, T4TOTAL, T3FREE, THYROIDAB in the last 72 hours.  Invalid input(s): FREET3  Cardiac Enzymes No results for input(s): CKMB, TROPONINI, MYOGLOBIN in the last 168 hours.  Invalid input(s): CK ------------------------------------------------------------------------------------------------------------------    Component Value Date/Time   BNP 35.4 04/20/2020 0049    Micro Results Recent Results (from the past 240 hour(s))  Culture, blood (routine x 2)     Status: None   Collection Time: 04/15/20  7:42 AM   Specimen: BLOOD  Result Value Ref Range Status  Specimen Description BLOOD LEFT ANTECUBITAL  Final   Special Requests   Final    BOTTLES DRAWN AEROBIC AND ANAEROBIC Blood Culture adequate volume   Culture   Final    NO GROWTH 5 DAYS Performed at [facility], [address], [place], [place] [address]    Report Status 04/20/2020 FINAL  Final  Resp Panel by RT-PCR (Flu A&B, Covid) Nasopharyngeal Swab     Status: Abnormal   Collection Time: 04/15/20  7:42 AM   Specimen: Nasopharyngeal Swab; Nasopharyngeal(NP) swabs in vial transport medium  Result Value Ref Range Status   SARS Coronavirus 2 by RT PCR POSITIVE (A) NEGATIVE Final    Comment: RESULT CALLED TO, READ BACK BY AND VERIFIED WITH: RN S.BETRAND ON 04/15/2020 AT 0926 BY E.PARRISH (NOTE) SARS-CoV-2 target nucleic acids are DETECTED.  The SARS-CoV-2 RNA is generally detectable in upper respiratory specimens during the acute phase of infection. Positive results are indicative of the presence of the identified virus, but do not rule out bacterial infection or co-infection with other pathogens not detected by the test. Clinical correlation with patient history and other diagnostic information is necessary to determine patient infection status. The expected result is Negative.  Fact Sheet for Patients: EntrepreneurPulse.com.au  Fact Sheet for Healthcare  Providers: IncredibleEmployment.be  This test is not yet approved or cleared by the [place] FDA and  has been authorized for detection and/or diagnosis of SARS-CoV-2 by FDA under an Emergency Use Authorization (EUA).  This EUA will remain in effect (meaning this  test can be used) for the duration of  the COVID-19 declaration under Section 564(b)(1) of the Act, 21 U.S.C. section 360bbb-3(b)(1), unless the authorization is terminated or revoked sooner.     Influenza A by PCR NEGATIVE NEGATIVE Final   Influenza B by PCR NEGATIVE NEGATIVE Final    Comment: (NOTE) The Xpert Xpress SARS-CoV-2/FLU/RSV plus assay is intended as an aid in the diagnosis of influenza from Nasopharyngeal swab specimens and should not be used as a sole basis for treatment. Nasal washings and aspirates are unacceptable for Xpert Xpress SARS-CoV-2/FLU/RSV testing.  Fact Sheet for Patients: EntrepreneurPulse.com.au  Fact Sheet for Healthcare Providers: IncredibleEmployment.be  This test is not yet approved or cleared by the [place] FDA and has been authorized for detection and/or diagnosis of SARS-CoV-2 by FDA under an Emergency Use Authorization (EUA). This EUA will remain in effect (meaning this test can be used) for the duration of the COVID-19 declaration under Section 564(b)(1) of the Act, 21 U.S.C. section 360bbb-3(b)(1), unless the authorization is terminated or revoked.  Performed at [facility], [place] [address], [place], [place] [address]   Culture, blood (routine x 2)     Status: None   Collection Time: 04/15/20  7:43 AM   Specimen: BLOOD RIGHT HAND  Result Value Ref Range Status   Specimen Description BLOOD RIGHT HAND  Final   Special Requests   Final    BOTTLES DRAWN AEROBIC ONLY Blood Culture results may not be optimal due to an inadequate volume of blood received in culture bottles   Culture   Final    NO GROWTH 5  DAYS Performed at [facility], [place] [address] [place], [place] [address]    Report Status 04/20/2020 FINAL  Final    Radiology Reports DG Wrist Complete Left  Result Date: 04/16/2020 CLINICAL DATA:  Left wrist swelling. EXAM: LEFT WRIST - COMPLETE 3+ VIEW COMPARISON:  None. FINDINGS: Degenerative changes between  the base of the first metacarpal and trapezium. Degenerative changes between the trapezium and scaphoid. Soft tissue swelling best seen on the lateral view. No fractures or bony erosions identified. IMPRESSION: Soft tissue swelling.  Degenerative changes as above. Electronically Signed   By: Dorise Bullion III M.D   On: 04/16/2020 12:33   CT Head Wo Contrast  Result Date: 04/15/2020 CLINICAL DATA:  Syncope with normal neuro exam EXAM: CT HEAD WITHOUT CONTRAST TECHNIQUE: Contiguous axial images were obtained from the base of the skull through the vertex without intravenous contrast. COMPARISON:  Brain MRI 01/02/2017 FINDINGS: Brain: No evidence of acute infarction, hemorrhage, hydrocephalus, extra-axial collection or mass effect. 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 Stable calcification in the right posterior frontal white matter age congruent cerebral volume loss. Known meningioma at the right vertex again measuring 9 mm. Vascular: No hyperdense vessel or unexpected calcification. Skull: Normal. Negative for fracture or focal lesion. Sinuses/Orbits: No acute finding. IMPRESSION: No acute finding or change from 2018 Electronically Signed   By: Monte Fantasia M.D.   On: 04/15/2020 08:42   CT Angio Chest PE W and/or Wo Contrast  Result Date: 04/15/2020 CLINICAL DATA:  Abdominal pain, nonlocalized, weakness, fatigue, chronic RIGHT breast infection for years drainage EXAM: CT ANGIOGRAPHY CHEST CT ABDOMEN AND PELVIS WITH CONTRAST TECHNIQUE: Multidetector CT imaging of the chest was performed using the standard protocol during bolus administration  of intravenous contrast. Multiplanar CT image reconstructions and MIPs were obtained to evaluate the vascular anatomy. Multidetector CT imaging of the abdomen and pelvis was performed using the standard protocol during bolus administration of intravenous contrast. CONTRAST:  15mL OMNIPAQUE IOHEXOL 350 MG/ML SOLN IV. No oral contrast. COMPARISON:  None FINDINGS: CTA CHEST FINDINGS Cardiovascular: Atherosclerotic calcifications aorta, coronary arteries, and proximal great vessels. Aorta normal caliber without aneurysm or dissection. Heart unremarkable. No pericardial effusion. Pulmonary arteries adequately opacified and patent. No evidence of pulmonary embolism. Mediastinum/Nodes: Base of cervical region normal appearance. Esophagus unremarkable. Large lobulated soft tissue mass identified at RIGHT breast extending through skin surface, 8.1 x 4.9 x 7.1 cm in size most consistent with malignancy rather than infection. Mass extends to the chest wall with loss of fat plane between the mass and the underlying pectoralis muscle. Additional small nodule within RIGHT breast 14 x 7 mm question second mass versus lymph node. RIGHT axillary adenopathy with nodes measuring up to 18 mm short axis. Normal sized mediastinal lymph nodes. Lungs/Pleura: Emphysematous changes. Dependent atelectasis. Lungs otherwise clear. No pulmonary infiltrate, pleural effusion, or pneumothorax. No pulmonary mass/nodule. Musculoskeletal: No acute osseous findings. Review of the MIP images confirms the above findings. CT ABDOMEN and PELVIS FINDINGS Hepatobiliary: Gallbladder and liver normal appearance Pancreas: Atrophic without focal mass Spleen: Normal appearance Adrenals/Urinary Tract: Cortical scarring at inferior pole LEFT kidney. Adrenal glands, kidneys, ureters, and bladder otherwise normal appearance Stomach/Bowel: Appendix not visualized, no pericecal inflammatory process seen. Stomach decompressed. Bowel wall thickening of descending colon  with mild surrounding infiltrative changes over a long length favoring descending colitis. Minimal thickening of LEFT lateral conal fascia. Sigmoid diverticulosis without evidence of diverticulitis. Remaining bowel loops unremarkable. Vascular/Lymphatic: Atherosclerotic calcifications aorta, iliac arteries, visceral arteries. Infrarenal abdominal aortic aneurysm 4.2 x 4.0 cm in greatest axial dimensions extending 6.6 cm length, terminating above bifurcation. Moderate thrombus. No perianeurysmal infiltration/hemorrhage. No adenopathy. Reproductive: Atrophic uterus with unremarkable adnexa Other: No free air or free fluid.  No hernia. Musculoskeletal: Osseous demineralization. Review of the MIP images confirms the above  findings. IMPRESSION: No evidence of pulmonary embolism. Large RIGHT breast mass 8.1 cm greatest size most consistent with malignancy with associated RIGHT axillary adenopathy. Additional small RIGHT breast nodule 14 x 7 mm question mass versus intramammary node. COPD. Descending colitis. Sigmoid diverticulosis without evidence of diverticulitis. Infrarenal abdominal aortic aneurysm 4.2 cm in greatest axial dimensions; Recommend follow-up every 12 months and vascular consultation. This recommendation follows ACR consensus guidelines: White Paper of the ACR Incidental Findings Committee II on Vascular Findings. J Am Coll Radiol 2013; 10:789-794. Emphysema (ICD10-J43.9). Aortic Atherosclerosis (ICD10-I70.0). Aortic aneurysm NOS (ICD10-I71.9).: Findings called to Dr.  Francia Greaves on 04/15/2020 at 0857 hours. Electronically Signed   By: Lavonia Dana M.D.   On: 04/15/2020 08:58   CT ABDOMEN PELVIS W CONTRAST  Result Date: 04/15/2020 CLINICAL DATA:  Abdominal pain, nonlocalized, weakness, fatigue, chronic RIGHT breast infection for years drainage EXAM: CT ANGIOGRAPHY CHEST CT ABDOMEN AND PELVIS WITH CONTRAST TECHNIQUE: Multidetector CT imaging of the chest was performed using the standard protocol during bolus  administration of intravenous contrast. Multiplanar CT image reconstructions and MIPs were obtained to evaluate the vascular anatomy. Multidetector CT imaging of the abdomen and pelvis was performed using the standard protocol during bolus administration of intravenous contrast. CONTRAST:  123mL OMNIPAQUE IOHEXOL 350 MG/ML SOLN IV. No oral contrast. COMPARISON:  None FINDINGS: CTA CHEST FINDINGS Cardiovascular: Atherosclerotic calcifications aorta, coronary arteries, and proximal great vessels. Aorta normal caliber without aneurysm or dissection. Heart unremarkable. No pericardial effusion. Pulmonary arteries adequately opacified and patent. No evidence of pulmonary embolism. Mediastinum/Nodes: Base of cervical region normal appearance. Esophagus unremarkable. Large lobulated soft tissue mass identified at RIGHT breast extending through skin surface, 8.1 x 4.9 x 7.1 cm in size most consistent with malignancy rather than infection. Mass extends to the chest wall with loss of fat plane between the mass and the underlying pectoralis muscle. Additional small nodule within RIGHT breast 14 x 7 mm question second mass versus lymph node. RIGHT axillary adenopathy with nodes measuring up to 18 mm short axis. Normal sized mediastinal lymph nodes. Lungs/Pleura: Emphysematous changes. Dependent atelectasis. Lungs otherwise clear. No pulmonary infiltrate, pleural effusion, or pneumothorax. No pulmonary mass/nodule. Musculoskeletal: No acute osseous findings. Review of the MIP images confirms the above findings. CT ABDOMEN and PELVIS FINDINGS Hepatobiliary: Gallbladder and liver normal appearance Pancreas: Atrophic without focal mass Spleen: Normal appearance Adrenals/Urinary Tract: Cortical scarring at inferior pole LEFT kidney. Adrenal glands, kidneys, ureters, and bladder otherwise normal appearance Stomach/Bowel: Appendix not visualized, no pericecal inflammatory process seen. Stomach decompressed. Bowel wall thickening of  descending colon with mild surrounding infiltrative changes over a long length favoring descending colitis. Minimal thickening of LEFT lateral conal fascia. Sigmoid diverticulosis without evidence of diverticulitis. Remaining bowel loops unremarkable. Vascular/Lymphatic: Atherosclerotic calcifications aorta, iliac arteries, visceral arteries. Infrarenal abdominal aortic aneurysm 4.2 x 4.0 cm in greatest axial dimensions extending 6.6 cm length, terminating above bifurcation. Moderate thrombus. No perianeurysmal infiltration/hemorrhage. No adenopathy. Reproductive: Atrophic uterus with unremarkable adnexa Other: No free air or free fluid.  No hernia. Musculoskeletal: Osseous demineralization. Review of the MIP images confirms the above findings. IMPRESSION: No evidence of pulmonary embolism. Large RIGHT breast mass 8.1 cm greatest size most consistent with malignancy with associated RIGHT axillary adenopathy. Additional small RIGHT breast nodule 14 x 7 mm question mass versus intramammary node. COPD. Descending colitis. Sigmoid diverticulosis without evidence of diverticulitis. Infrarenal abdominal aortic aneurysm 4.2 cm in greatest axial dimensions; Recommend follow-up every 12 months and vascular consultation. This recommendation follows ACR consensus  guidelines: White Paper of the ACR Incidental Findings Committee II on Vascular Findings. J Am Coll Radiol 2013; 10:789-794. Emphysema (ICD10-J43.9). Aortic Atherosclerosis (ICD10-I70.0). Aortic aneurysm NOS (ICD10-I71.9).: Findings called to Dr.  Francia Greaves on 04/15/2020 at 0857 hours. Electronically Signed   By: Lavonia Dana M.D.   On: 04/15/2020 08:58   DG Chest Port 1 View  Result Date: 04/15/2020 CLINICAL DATA:  Recent syncopal episode EXAM: PORTABLE CHEST 1 VIEW COMPARISON:  None. FINDINGS: Cardiac shadows within normal limits. Aortic calcifications are noted. Lungs are hyperinflated bilaterally. Rounded density is noted in the right base measuring proximally 6  cm. This may represent fluid within the major fissure although the possibility of underlying mass deserves consideration. CT of the chest with contrast is recommended for further evaluation. IMPRESSION: Rounded density in the right lung base suspicious for underlying mass. CT is recommended for further evaluation. Electronically Signed   By: Inez Catalina M.D.   On: 04/15/2020 07:36   VAS [place] LOWER EXTREMITY VENOUS (DVT)  Result Date: 04/16/2020  Lower Venous DVT Study Indications: Rapidly rising D-dimer & Covid+.  Comparison Study: No previous exams Performing Technologist: Rogelia Rohrer  Examination Guidelines: A complete evaluation includes B-mode imaging, spectral Doppler, color Doppler, and power Doppler as needed of all accessible portions of each vessel. Bilateral testing is considered an integral part of a complete examination. Limited examinations for reoccurring indications may be performed as noted. The reflux portion of the exam is performed with the patient in reverse Trendelenburg.  +---------+---------------+---------+-----------+----------+--------------+ RIGHT    CompressibilityPhasicitySpontaneityPropertiesThrombus Aging +---------+---------------+---------+-----------+----------+--------------+ CFV      Full           Yes      Yes                                 +---------+---------------+---------+-----------+----------+--------------+ SFJ      Full                                                        +---------+---------------+---------+-----------+----------+--------------+ FV Prox  Full           Yes      Yes                                 +---------+---------------+---------+-----------+----------+--------------+ FV Mid   Full           Yes      Yes                                 +---------+---------------+---------+-----------+----------+--------------+ FV DistalFull           Yes      Yes                                  +---------+---------------+---------+-----------+----------+--------------+ PFV      Full                                                        +---------+---------------+---------+-----------+----------+--------------+  POP      Full           Yes      Yes                                 +---------+---------------+---------+-----------+----------+--------------+ PTV      Full                                                        +---------+---------------+---------+-----------+----------+--------------+ PERO     Full                                                        +---------+---------------+---------+-----------+----------+--------------+   +---------+---------------+---------+-----------+----------+--------------+ LEFT     CompressibilityPhasicitySpontaneityPropertiesThrombus Aging +---------+---------------+---------+-----------+----------+--------------+ CFV      Full                                                        +---------+---------------+---------+-----------+----------+--------------+ SFJ      Full                                                        +---------+---------------+---------+-----------+----------+--------------+ FV Prox  Full           Yes      Yes                                 +---------+---------------+---------+-----------+----------+--------------+ FV Mid   Full           Yes      Yes                                 +---------+---------------+---------+-----------+----------+--------------+ FV DistalFull           Yes      Yes                                 +---------+---------------+---------+-----------+----------+--------------+ PFV      Full                                                        +---------+---------------+---------+-----------+----------+--------------+ POP      Full           Yes      Yes                                  +---------+---------------+---------+-----------+----------+--------------+  PTV      Full                                                        +---------+---------------+---------+-----------+----------+--------------+ PERO     Full                                                        +---------+---------------+---------+-----------+----------+--------------+     Summary: BILATERAL: - No evidence of deep vein thrombosis seen in the lower extremities, bilaterally. - No evidence of superficial venous thrombosis in the lower extremities, bilaterally. -No evidence of popliteal cyst, bilaterally.   *See table(s) above for measurements and observations. Electronically signed by Jamelle Haring on 04/16/2020 at 5:48:36 PM.    Final

## 2020-04-23 NOTE — Progress Notes (Signed)
  Echocardiogram 2D Echocardiogram has been performed.  Elizabeth Chase 04/23/2020, 3:13 PM

## 2020-04-24 DIAGNOSIS — N63 Unspecified lump in unspecified breast: Secondary | ICD-10-CM | POA: Diagnosis not present

## 2020-04-24 DIAGNOSIS — R55 Syncope and collapse: Secondary | ICD-10-CM | POA: Diagnosis not present

## 2020-04-24 LAB — CBC
HCT: 34.2 % — ABNORMAL LOW (ref 36.0–46.0)
Hemoglobin: 11 g/dL — ABNORMAL LOW (ref 12.0–15.0)
MCH: 29.1 pg (ref 26.0–34.0)
MCHC: 32.2 g/dL (ref 30.0–36.0)
MCV: 90.5 fL (ref 80.0–100.0)
Platelets: 427 10*3/uL — ABNORMAL HIGH (ref 150–400)
RBC: 3.78 MIL/uL — ABNORMAL LOW (ref 3.87–5.11)
RDW: 14.3 % (ref 11.5–15.5)
WBC: 6.1 10*3/uL (ref 4.0–10.5)
nRBC: 0 % (ref 0.0–0.2)

## 2020-04-24 LAB — BASIC METABOLIC PANEL
Anion gap: 8 (ref 5–15)
BUN: 11 mg/dL (ref 8–23)
CO2: 26 mmol/L (ref 22–32)
Calcium: 8.8 mg/dL — ABNORMAL LOW (ref 8.9–10.3)
Chloride: 105 mmol/L (ref 98–111)
Creatinine, Ser: 0.79 mg/dL (ref 0.44–1.00)
GFR, Estimated: >60 mL/min (ref >60)
Glucose, Bld: 107 mg/dL — ABNORMAL HIGH (ref 70–99)
Potassium: 3.8 mmol/L (ref 3.5–5.1)
Sodium: 139 mmol/L (ref 135–145)

## 2020-04-24 NOTE — Progress Notes (Signed)
PROGRESS NOTE                                                                                                                                                                                                             Patient Demographics:    Elizabeth Chase, is a 80 y.o. female, DOB - 04-Sep-1940, WPY:[id number]  Outpatient Primary MD for the patient is No primary care provider on file.    LOS - 9  Admit date - 04/14/2020    Chief Complaint  Patient presents with  . Breast Infection        Brief Narrative (HPI from H&P)    - Elizabeth Chase is a 80 y.o. female with medical history significant of hypertension and hyperlipidemia who presented episode of syncope where she stood up to walk and almost passed out, was caught by her daughter on her way down, lost consciousness for a few minutes with no bowel or bladder incontinence, in the ER she was found to be dehydrated and hypotensive, on exam she was found to have a fungating right breast mass, imaging suggested colitis along with right breast malignancy and she was admitted to the hospital.   Subjective:   Patient in bed, she denies any complaints today, no chest pain, no chest pain, no fever or chills.     Assessment  & Plan :   Syncope due to hypotension.  - Caused by dehydration and possible mild colitis.  Currently no diarrhea, has been hydrated with IV fluids and is feeling better already, continue hydration, PT OT and monitor.  Head CT is nonacute and exam is nonfocal   colitis.   -resolved received with IV Flagyl and cephalosporin initially, currently off antibiotics  Breast cancer with fungating right breast mass with right axillary lymphadenopathy.  -History significant for invasive ductal carcinoma. -I have discussed with oncology, patient will need chemotherapy, then followed by surgery and radiation, . -2D echo was obtained per oncology recommendation, IR  consulted, likely she will have Port-A-Cath placement after finishing her Covid isolation on Tuesday.  Left wrist swelling appears to be a hematoma at the site of IV.  IV removed, stable X-Ray.  Much improved.  Fully vaccinated with incidental Covid infection.   - 3 days of remdesivir.  Elevated D-dimer.  - CTA lungs is negative.  Venous Dopplers is  negative for DVT.   Poor social situation.  Homeless.  Social work following.       Condition - Extremely Guarded  Family Communication  :  None at  Bedside, D/W daughter by phone.  Code Status :  Full  Consults  :  CCS, Onc  PUD Prophylaxis :    Procedures  :     Right breast excision biopsy by general surgery on 04/18/2020.    CT head - Non acute  CT Chest - Abd - Pelvis - No evidence of pulmonary embolism. Large RIGHT breast mass 8.1 cm greatest size most consistent with malignancy with associated RIGHT axillary adenopathy. Additional small RIGHT breast nodule 14 x 7 mm question mass versus intramammary node. COPD. Descending colitis. Sigmoid diverticulosis without evidence of diverticulitis. Infrarenal abdominal aortic aneurysm 4.2 cm in greatest axial dimensions      Disposition Plan  :    Status is: Inpatient  Remains inpatient appropriate because:IV treatments appropriate due to intensity of illness or inability to take PO   Dispo: The patient is from: homeless              Anticipated d/c is to: Patient is homeless, awaiting safe disposition plan              Patient currently is medically stable to d/c.   Difficult to place patient Yes  DVT Prophylaxis  :   Heparin - SCDs   Lab Results  Component Value Date   PLT 427 (H) 04/24/2020    Diet :  Diet Order            Diet regular Room service appropriate? Yes; Fluid consistency: Thin  Diet effective now                  Inpatient Medications  Scheduled Meds: . vitamin C  500 mg Oral Daily  . atorvastatin  20 mg Oral q1800  . fluticasone  furoate-vilanterol  1 puff Inhalation Daily  . heparin injection (subcutaneous)  5,000 Units Subcutaneous Q8H  . lidocaine-EPINEPHrine  20 mL Intradermal Once  . sodium chloride flush  3 mL Intravenous Q12H  . zinc sulfate  220 mg Oral Daily   Continuous Infusions:  PRN Meds:.acetaminophen **OR** [DISCONTINUED] acetaminophen, albuterol, guaiFENesin-dextromethorphan, silver nitrate applicators, traZODone  Antibiotics  :    Anti-infectives (From admission, onward)   Start     Dose/Rate Route Frequency Ordered Stop   04/18/20 1400  metroNIDAZOLE (FLAGYL) tablet 500 mg  Status:  Discontinued        500 mg Oral Every 8 hours 04/18/20 1049 04/21/20 1403   04/16/20 1000  remdesivir 100 mg in sodium chloride 0.9 % 100 mL IVPB       "Followed by" Linked Group Details   100 mg 200 mL/hr over 30 Minutes Intravenous Daily 04/15/20 1203 04/17/20 1109   04/15/20 1915  metroNIDAZOLE (FLAGYL) IVPB 500 mg  Status:  Discontinued        500 mg 100 mL/hr over 60 Minutes Intravenous Every 8 hours 04/15/20 1822 04/18/20 1049   04/15/20 1630  ceFAZolin (ANCEF) IVPB 1 g/50 mL premix  Status:  Discontinued        1 g 100 mL/hr over 30 Minutes Intravenous Every 8 hours 04/15/20 1154 04/18/20 1049   04/15/20 1400  remdesivir 200 mg in sodium chloride 0.9% 250 mL IVPB       "Followed by" Linked Group Details   200 mg 580 mL/hr over 30 Minutes Intravenous Once  04/15/20 1203 04/15/20 2101   04/15/20 0745  ceFAZolin (ANCEF) IVPB 1 g/50 mL premix        1 g 100 mL/hr over 30 Minutes Intravenous  Once 04/15/20 0732 04/15/20 0902        Elizabeth Chase M.D on 04/24/2020 at 3:02 PM  To page go to [email or web address]   [facility] -  Office  [phone]    See all Orders from today for further details    Objective:   Vitals:   04/23/20 1456 04/23/20 2158 04/24/20 0645 04/24/20 1442  BP: (!) 156/64 (!) 161/75 (!) 155/77 (!) 135/58  Pulse: 67 72 77 72  Resp: 16 20 20 20   Temp: 97.8 F (36.6 C) 98  F (36.7 C) 98.4 F (36.9 C) 98.1 F (36.7 C)  TempSrc: Oral Oral Oral Oral  SpO2: 95% 96% 98% 96%  Weight:      Height:        Wt Readings from Last 3 Encounters:  04/15/20 54.9 kg  03/01/17 (!) 558.7 kg  01/15/17 51.9 kg     Intake/Output Summary (Last 24 hours) at 04/24/2020 1502 Last data filed at 04/24/2020 9357 Gross per 24 hour  Intake 200 ml  Output --  Net 200 ml     Physical Exam  Awake Alert, Oriented X 3, frail, no new F.N deficits, Normal affect Symmetrical Chest wall movement, Good air movement bilaterally, CTAB RRR,No Gallops,Rubs or new Murmurs, No Parasternal Heave +ve B.Sounds, Abd Soft, No tenderness, No rebound - guarding or rigidity. No Cyanosis, Clubbing or edema, No new Rash or bruise         Data Review:    CBC Recent Labs  Lab 04/18/20 0114 04/19/20 0010 04/20/20 0049 04/24/20 0137  WBC 6.0 6.0 6.3 6.1  HGB 10.3* 10.2* 10.5* 11.0*  HCT 32.2* 31.8* 31.2* 34.2*  PLT 300 349 368 427*  MCV 89.4 89.8 87.9 90.5  MCH 28.6 28.8 29.6 29.1  MCHC 32.0 32.1 33.7 32.2  RDW 13.2 13.2 13.3 14.3  LYMPHSABS 1.0 1.1 1.3  --   MONOABS 0.6 0.6 0.6  --   EOSABS 0.4 0.5 0.4  --   BASOSABS 0.0 0.0 0.1  --     Recent Labs  Lab 04/18/20 0114 04/19/20 0010 04/20/20 0049 04/24/20 0137  NA 137 138 138 139  K 3.6 3.7 4.2 3.8  CL 104 105 106 105  CO2 25 25 21* 26  GLUCOSE 114* 127* 110* 107*  BUN 14 12 13 11   CREATININE 0.73 0.64 0.75 0.79  CALCIUM 8.2* 8.4* 8.4* 8.8*  AST 17 23 24   --   ALT 9 13 11   --   ALKPHOS 39 41 43  --   BILITOT 0.4 0.4 0.5  --   ALBUMIN 2.4* 2.6* 2.7*  --   MG 1.7 1.8 1.8  --   CRP 7.7* 4.3* 2.2*  --   DDIMER 2.31* 1.94* 1.85*  --   BNP 79.9 48.3 35.4  --     ------------------------------------------------------------------------------------------------------------------ No results for input(s): CHOL, HDL, LDLCALC, TRIG, CHOLHDL, LDLDIRECT in the last 72 hours.  Lab Results  Component Value Date   HGBA1C  5.9 (H) 01/02/2017   ------------------------------------------------------------------------------------------------------------------ No results for input(s): TSH, T4TOTAL, T3FREE, THYROIDAB in the last 72 hours.  Invalid input(s): FREET3  Cardiac Enzymes No results for input(s): CKMB, TROPONINI, MYOGLOBIN in the last 168 hours.  Invalid input(s): CK ------------------------------------------------------------------------------------------------------------------    Component Value Date/Time   BNP 35.4 04/20/2020 0049  Micro Results Recent Results (from the past 240 hour(s))  Culture, blood (routine x 2)     Status: None   Collection Time: 04/15/20  7:42 AM   Specimen: BLOOD  Result Value Ref Range Status   Specimen Description BLOOD LEFT ANTECUBITAL  Final   Special Requests   Final    BOTTLES DRAWN AEROBIC AND ANAEROBIC Blood Culture adequate volume   Culture   Final    NO GROWTH 5 DAYS Performed at [facility], [address], [place], [place] [address]    Report Status 04/20/2020 FINAL  Final  Resp Panel by RT-PCR (Flu A&B, Covid) Nasopharyngeal Swab     Status: Abnormal   Collection Time: 04/15/20  7:42 AM   Specimen: Nasopharyngeal Swab; Nasopharyngeal(NP) swabs in vial transport medium  Result Value Ref Range Status   SARS Coronavirus 2 by RT PCR POSITIVE (A) NEGATIVE Final    Comment: RESULT CALLED TO, READ BACK BY AND VERIFIED WITH: RN S.BETRAND ON 04/15/2020 AT 0926 BY E.PARRISH (NOTE) SARS-CoV-2 target nucleic acids are DETECTED.  The SARS-CoV-2 RNA is generally detectable in upper respiratory specimens during the acute phase of infection. Positive results are indicative of the presence of the identified virus, but do not rule out bacterial infection or co-infection with other pathogens not detected by the test. Clinical correlation with patient history and other diagnostic information is necessary to determine patient infection status. The expected  result is Negative.  Fact Sheet for Patients: EntrepreneurPulse.com.au  Fact Sheet for Healthcare Providers: IncredibleEmployment.be  This test is not yet approved or cleared by the [place] FDA and  has been authorized for detection and/or diagnosis of SARS-CoV-2 by FDA under an Emergency Use Authorization (EUA).  This EUA will remain in effect (meaning this  test can be used) for the duration of  the COVID-19 declaration under Section 564(b)(1) of the Act, 21 U.S.C. section 360bbb-3(b)(1), unless the authorization is terminated or revoked sooner.     Influenza A by PCR NEGATIVE NEGATIVE Final   Influenza B by PCR NEGATIVE NEGATIVE Final    Comment: (NOTE) The Xpert Xpress SARS-CoV-2/FLU/RSV plus assay is intended as an aid in the diagnosis of influenza from Nasopharyngeal swab specimens and should not be used as a sole basis for treatment. Nasal washings and aspirates are unacceptable for Xpert Xpress SARS-CoV-2/FLU/RSV testing.  Fact Sheet for Patients: EntrepreneurPulse.com.au  Fact Sheet for Healthcare Providers: IncredibleEmployment.be  This test is not yet approved or cleared by the [place] FDA and has been authorized for detection and/or diagnosis of SARS-CoV-2 by FDA under an Emergency Use Authorization (EUA). This EUA will remain in effect (meaning this test can be used) for the duration of the COVID-19 declaration under Section 564(b)(1) of the Act, 21 U.S.C. section 360bbb-3(b)(1), unless the authorization is terminated or revoked.  Performed at [facility], [place] [address], [place], [place] [address]   Culture, blood (routine x 2)     Status: None   Collection Time: 04/15/20  7:43 AM   Specimen: BLOOD RIGHT HAND  Result Value Ref Range Status   Specimen Description BLOOD RIGHT HAND  Final   Special Requests   Final    BOTTLES DRAWN AEROBIC ONLY Blood Culture results may  not be optimal due to an inadequate volume of blood received in culture bottles   Culture   Final    NO GROWTH 5 DAYS Performed at [facility], [place] [address] [place], [place] [address]  Report Status 04/20/2020 FINAL  Final    Radiology Reports DG Wrist Complete Left  Result Date: 04/16/2020 CLINICAL DATA:  Left wrist swelling. EXAM: LEFT WRIST - COMPLETE 3+ VIEW COMPARISON:  None. FINDINGS: Degenerative changes between the base of the first metacarpal and trapezium. Degenerative changes between the trapezium and scaphoid. Soft tissue swelling best seen on the lateral view. No fractures or bony erosions identified. IMPRESSION: Soft tissue swelling.  Degenerative changes as above. Electronically Signed   By: Dorise Bullion III M.D   On: 04/16/2020 12:33   CT Head Wo Contrast  Result Date: 04/15/2020 CLINICAL DATA:  Syncope with normal neuro exam EXAM: CT HEAD WITHOUT CONTRAST TECHNIQUE: Contiguous axial images were obtained from the base of the skull through the vertex without intravenous contrast. COMPARISON:  Brain MRI 01/02/2017 FINDINGS: Brain: No evidence of acute infarction, hemorrhage, hydrocephalus, extra-axial collection or mass effect. 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 Stable calcification in the right posterior frontal white matter age congruent cerebral volume loss. Known meningioma at the right vertex again measuring 9 mm. Vascular: No hyperdense vessel or unexpected calcification. Skull: Normal. Negative for fracture or focal lesion. Sinuses/Orbits: No acute finding. IMPRESSION: No acute finding or change from 2018 Electronically Signed   By: Monte Fantasia M.D.   On: 04/15/2020 08:42   CT Angio Chest PE W and/or Wo Contrast  Result Date: 04/15/2020 CLINICAL DATA:  Abdominal pain, nonlocalized, weakness, fatigue, chronic RIGHT breast infection for years drainage EXAM: CT ANGIOGRAPHY CHEST CT ABDOMEN AND PELVIS WITH CONTRAST  TECHNIQUE: Multidetector CT imaging of the chest was performed using the standard protocol during bolus administration of intravenous contrast. Multiplanar CT image reconstructions and MIPs were obtained to evaluate the vascular anatomy. Multidetector CT imaging of the abdomen and pelvis was performed using the standard protocol during bolus administration of intravenous contrast. CONTRAST:  195mL OMNIPAQUE IOHEXOL 350 MG/ML SOLN IV. No oral contrast. COMPARISON:  None FINDINGS: CTA CHEST FINDINGS Cardiovascular: Atherosclerotic calcifications aorta, coronary arteries, and proximal great vessels. Aorta normal caliber without aneurysm or dissection. Heart unremarkable. No pericardial effusion. Pulmonary arteries adequately opacified and patent. No evidence of pulmonary embolism. Mediastinum/Nodes: Base of cervical region normal appearance. Esophagus unremarkable. Large lobulated soft tissue mass identified at RIGHT breast extending through skin surface, 8.1 x 4.9 x 7.1 cm in size most consistent with malignancy rather than infection. Mass extends to the chest wall with loss of fat plane between the mass and the underlying pectoralis muscle. Additional small nodule within RIGHT breast 14 x 7 mm question second mass versus lymph node. RIGHT axillary adenopathy with nodes measuring up to 18 mm short axis. Normal sized mediastinal lymph nodes. Lungs/Pleura: Emphysematous changes. Dependent atelectasis. Lungs otherwise clear. No pulmonary infiltrate, pleural effusion, or pneumothorax. No pulmonary mass/nodule. Musculoskeletal: No acute osseous findings. Review of the MIP images confirms the above findings. CT ABDOMEN and PELVIS FINDINGS Hepatobiliary: Gallbladder and liver normal appearance Pancreas: Atrophic without focal mass Spleen: Normal appearance Adrenals/Urinary Tract: Cortical scarring at inferior pole LEFT kidney. Adrenal glands, kidneys, ureters, and bladder otherwise normal appearance Stomach/Bowel: Appendix  not visualized, no pericecal inflammatory process seen. Stomach decompressed. Bowel wall thickening of descending colon with mild surrounding infiltrative changes over a long length favoring descending colitis. Minimal thickening of LEFT lateral conal fascia. Sigmoid diverticulosis without evidence of diverticulitis. Remaining bowel loops unremarkable. Vascular/Lymphatic: Atherosclerotic calcifications aorta, iliac arteries, visceral arteries. Infrarenal abdominal aortic aneurysm 4.2 x 4.0 cm in greatest axial dimensions extending 6.6  cm length, terminating above bifurcation. Moderate thrombus. No perianeurysmal infiltration/hemorrhage. No adenopathy. Reproductive: Atrophic uterus with unremarkable adnexa Other: No free air or free fluid.  No hernia. Musculoskeletal: Osseous demineralization. Review of the MIP images confirms the above findings. IMPRESSION: No evidence of pulmonary embolism. Large RIGHT breast mass 8.1 cm greatest size most consistent with malignancy with associated RIGHT axillary adenopathy. Additional small RIGHT breast nodule 14 x 7 mm question mass versus intramammary node. COPD. Descending colitis. Sigmoid diverticulosis without evidence of diverticulitis. Infrarenal abdominal aortic aneurysm 4.2 cm in greatest axial dimensions; Recommend follow-up every 12 months and vascular consultation. This recommendation follows ACR consensus guidelines: White Paper of the ACR Incidental Findings Committee II on Vascular Findings. J Am Coll Radiol 2013; 10:789-794. Emphysema (ICD10-J43.9). Aortic Atherosclerosis (ICD10-I70.0). Aortic aneurysm NOS (ICD10-I71.9).: Findings called to Dr.  Francia Greaves on 04/15/2020 at 0857 hours. Electronically Signed   By: Lavonia Dana M.D.   On: 04/15/2020 08:58   CT ABDOMEN PELVIS W CONTRAST  Result Date: 04/15/2020 CLINICAL DATA:  Abdominal pain, nonlocalized, weakness, fatigue, chronic RIGHT breast infection for years drainage EXAM: CT ANGIOGRAPHY CHEST CT ABDOMEN AND  PELVIS WITH CONTRAST TECHNIQUE: Multidetector CT imaging of the chest was performed using the standard protocol during bolus administration of intravenous contrast. Multiplanar CT image reconstructions and MIPs were obtained to evaluate the vascular anatomy. Multidetector CT imaging of the abdomen and pelvis was performed using the standard protocol during bolus administration of intravenous contrast. CONTRAST:  151mL OMNIPAQUE IOHEXOL 350 MG/ML SOLN IV. No oral contrast. COMPARISON:  None FINDINGS: CTA CHEST FINDINGS Cardiovascular: Atherosclerotic calcifications aorta, coronary arteries, and proximal great vessels. Aorta normal caliber without aneurysm or dissection. Heart unremarkable. No pericardial effusion. Pulmonary arteries adequately opacified and patent. No evidence of pulmonary embolism. Mediastinum/Nodes: Base of cervical region normal appearance. Esophagus unremarkable. Large lobulated soft tissue mass identified at RIGHT breast extending through skin surface, 8.1 x 4.9 x 7.1 cm in size most consistent with malignancy rather than infection. Mass extends to the chest wall with loss of fat plane between the mass and the underlying pectoralis muscle. Additional small nodule within RIGHT breast 14 x 7 mm question second mass versus lymph node. RIGHT axillary adenopathy with nodes measuring up to 18 mm short axis. Normal sized mediastinal lymph nodes. Lungs/Pleura: Emphysematous changes. Dependent atelectasis. Lungs otherwise clear. No pulmonary infiltrate, pleural effusion, or pneumothorax. No pulmonary mass/nodule. Musculoskeletal: No acute osseous findings. Review of the MIP images confirms the above findings. CT ABDOMEN and PELVIS FINDINGS Hepatobiliary: Gallbladder and liver normal appearance Pancreas: Atrophic without focal mass Spleen: Normal appearance Adrenals/Urinary Tract: Cortical scarring at inferior pole LEFT kidney. Adrenal glands, kidneys, ureters, and bladder otherwise normal appearance  Stomach/Bowel: Appendix not visualized, no pericecal inflammatory process seen. Stomach decompressed. Bowel wall thickening of descending colon with mild surrounding infiltrative changes over a long length favoring descending colitis. Minimal thickening of LEFT lateral conal fascia. Sigmoid diverticulosis without evidence of diverticulitis. Remaining bowel loops unremarkable. Vascular/Lymphatic: Atherosclerotic calcifications aorta, iliac arteries, visceral arteries. Infrarenal abdominal aortic aneurysm 4.2 x 4.0 cm in greatest axial dimensions extending 6.6 cm length, terminating above bifurcation. Moderate thrombus. No perianeurysmal infiltration/hemorrhage. No adenopathy. Reproductive: Atrophic uterus with unremarkable adnexa Other: No free air or free fluid.  No hernia. Musculoskeletal: Osseous demineralization. Review of the MIP images confirms the above findings. IMPRESSION: No evidence of pulmonary embolism. Large RIGHT breast mass 8.1 cm greatest size most consistent with malignancy with associated RIGHT axillary adenopathy. Additional small RIGHT breast nodule 14 x  7 mm question mass versus intramammary node. COPD. Descending colitis. Sigmoid diverticulosis without evidence of diverticulitis. Infrarenal abdominal aortic aneurysm 4.2 cm in greatest axial dimensions; Recommend follow-up every 12 months and vascular consultation. This recommendation follows ACR consensus guidelines: White Paper of the ACR Incidental Findings Committee II on Vascular Findings. J Am Coll Radiol 2013; 10:789-794. Emphysema (ICD10-J43.9). Aortic Atherosclerosis (ICD10-I70.0). Aortic aneurysm NOS (ICD10-I71.9).: Findings called to Dr.  Francia Greaves on 04/15/2020 at 0857 hours. Electronically Signed   By: Lavonia Dana M.D.   On: 04/15/2020 08:58   DG Chest Port 1 View  Result Date: 04/15/2020 CLINICAL DATA:  Recent syncopal episode EXAM: PORTABLE CHEST 1 VIEW COMPARISON:  None. FINDINGS: Cardiac shadows within normal limits. Aortic  calcifications are noted. Lungs are hyperinflated bilaterally. Rounded density is noted in the right base measuring proximally 6 cm. This may represent fluid within the major fissure although the possibility of underlying mass deserves consideration. CT of the chest with contrast is recommended for further evaluation. IMPRESSION: Rounded density in the right lung base suspicious for underlying mass. CT is recommended for further evaluation. Electronically Signed   By: Inez Catalina M.D.   On: 04/15/2020 07:36   VAS [place] LOWER EXTREMITY VENOUS (DVT)  Result Date: 04/16/2020  Lower Venous DVT Study Indications: Rapidly rising D-dimer & Covid+.  Comparison Study: No previous exams Performing Technologist: Rogelia Rohrer  Examination Guidelines: A complete evaluation includes B-mode imaging, spectral Doppler, color Doppler, and power Doppler as needed of all accessible portions of each vessel. Bilateral testing is considered an integral part of a complete examination. Limited examinations for reoccurring indications may be performed as noted. The reflux portion of the exam is performed with the patient in reverse Trendelenburg.  +---------+---------------+---------+-----------+----------+--------------+ RIGHT    CompressibilityPhasicitySpontaneityPropertiesThrombus Aging +---------+---------------+---------+-----------+----------+--------------+ CFV      Full           Yes      Yes                                 +---------+---------------+---------+-----------+----------+--------------+ SFJ      Full                                                        +---------+---------------+---------+-----------+----------+--------------+ FV Prox  Full           Yes      Yes                                 +---------+---------------+---------+-----------+----------+--------------+ FV Mid   Full           Yes      Yes                                  +---------+---------------+---------+-----------+----------+--------------+ FV DistalFull           Yes      Yes                                 +---------+---------------+---------+-----------+----------+--------------+ PFV      Full                                                        +---------+---------------+---------+-----------+----------+--------------+  POP      Full           Yes      Yes                                 +---------+---------------+---------+-----------+----------+--------------+ PTV      Full                                                        +---------+---------------+---------+-----------+----------+--------------+ PERO     Full                                                        +---------+---------------+---------+-----------+----------+--------------+   +---------+---------------+---------+-----------+----------+--------------+ LEFT     CompressibilityPhasicitySpontaneityPropertiesThrombus Aging +---------+---------------+---------+-----------+----------+--------------+ CFV      Full                                                        +---------+---------------+---------+-----------+----------+--------------+ SFJ      Full                                                        +---------+---------------+---------+-----------+----------+--------------+ FV Prox  Full           Yes      Yes                                 +---------+---------------+---------+-----------+----------+--------------+ FV Mid   Full           Yes      Yes                                 +---------+---------------+---------+-----------+----------+--------------+ FV DistalFull           Yes      Yes                                 +---------+---------------+---------+-----------+----------+--------------+ PFV      Full                                                         +---------+---------------+---------+-----------+----------+--------------+ POP      Full           Yes      Yes                                 +---------+---------------+---------+-----------+----------+--------------+  PTV      Full                                                        +---------+---------------+---------+-----------+----------+--------------+ PERO     Full                                                        +---------+---------------+---------+-----------+----------+--------------+     Summary: BILATERAL: - No evidence of deep vein thrombosis seen in the lower extremities, bilaterally. - No evidence of superficial venous thrombosis in the lower extremities, bilaterally. -No evidence of popliteal cyst, bilaterally.   *See table(s) above for measurements and observations. Electronically signed by Jamelle Haring on 04/16/2020 at 5:48:36 PM.    Final    ECHOCARDIOGRAM LIMITED  Result Date: 04/23/2020    ECHOCARDIOGRAM LIMITED REPORT   Patient Name:   Elizabeth Chase Date of Exam: 04/23/2020 Medical Rec #:  [id number]     Height:       65.0 in Accession #:    [id number]    Weight:       121.0 lb Date of Birth:  04/13/1940     BSA:          1.598 m Patient Age:    58 years      BP:           123/78 mmHg Patient Gender: F             HR:           68 bpm. Exam Location:  Inpatient Procedure: Limited Echo, Limited Color Doppler, Strain Analysis and Cardiac            Doppler Indications:    breast cancer  History:        Patient has prior history of Echocardiogram examinations, most                 recent 01/02/2017. Covid; Risk Factors:Hypertension and                 Dyslipidemia.  Sonographer:    Johny Chess Referring Phys: 36 Ferol Laiche S Slater  1. Focused echo perfomed in setting of COVID + status.  2. Left ventricular ejection fraction, by estimation, is 55%. The left ventricle has normal function. Left ventricular diastolic parameters are consistent with  Grade I diastolic dysfunction (impaired relaxation).  3. Right ventricular systolic function is normal. The right ventricular size is normal.  4. Aortic valve regurgitation is trivial.  5. The inferior vena cava is normal in size with greater than 50% respiratory variability, suggesting right atrial pressure of 3 mmHg. FINDINGS  Left Ventricle: Left ventricular ejection fraction, by estimation, is 55%. The left ventricle has normal function. Global longitudinal strain performed but not reported based on interpreter judgement due to suboptimal tracking. Left ventricular diastolic parameters are consistent with Grade I diastolic dysfunction (impaired relaxation). Right Ventricle: The right ventricular size is normal. No increase in right ventricular wall thickness. Right ventricular systolic function is normal. Pericardium: Trivial pericardial effusion is present. Aortic Valve: Aortic valve regurgitation is trivial. Venous: The inferior vena cava is normal in size with  greater than 50% respiratory variability, suggesting right atrial pressure of 3 mmHg.  Diastology LV e' medial:    4.03 cm/s LV E/e' medial:  16.8 LV e' lateral:   4.46 cm/s LV E/e' lateral: 15.2  IVC IVC diam: 1.10 cm LEFT ATRIUM           Index LA Vol (A4C): 21.3 ml 13.33 ml/m  AORTIC VALVE LVOT Vmax:   90.00 cm/s LVOT Vmean:  58.100 cm/s LVOT VTI:    0.169 m MV E velocity: 67.90 cm/s MV A velocity: 141.00 cm/s  SHUNTS MV E/A ratio:  0.48         Systemic VTI: 0.17 m Cherlynn Kaiser MD Electronically signed by Cherlynn Kaiser MD Signature Date/Time: 04/23/2020/4:37:22 PM    Final

## 2020-04-25 DIAGNOSIS — C50919 Malignant neoplasm of unspecified site of unspecified female breast: Secondary | ICD-10-CM | POA: Diagnosis not present

## 2020-04-25 DIAGNOSIS — R55 Syncope and collapse: Secondary | ICD-10-CM | POA: Diagnosis not present

## 2020-04-25 DIAGNOSIS — Z170 Estrogen receptor positive status [ER+]: Secondary | ICD-10-CM

## 2020-04-25 NOTE — TOC Progression Note (Signed)
Transition of Care [facility]) - Progression Note    Patient Details  Name: Elizabeth Chase MRN: [id number] Date of Birth: 1940/06/24  Transition of Care [facility] CM/SW [place], LCSW Phone Number: 04/25/2020, 3:35 PM  Clinical Narrative:    CSW spoke with patient's daughter. She reported that her friend has paid for a motel in [place] for her and the patient. She had a flat tire and a state trooper paid for her to get a new one. She will be coming to the hospital tomorrow for patient's procedure and will be able to take her to the motel with her at that time if stable. She has lined up a mobile home that she believes will be ready at the end of this week. No other social work needs identified, though it may be beneficial to have patient's meds sent to [place] at discharge.     Expected Discharge Plan: Home/Self Care Barriers to Discharge: Continued Medical Work up,Homeless with medical needs  Expected Discharge Plan and Services Expected Discharge Plan: Home/Self Care In-house Referral: Clinical Social Work     Living arrangements for the past 2 months: Homeless (car)                                       Social Determinants of Health (SDOH) Interventions    Readmission Risk Interventions No flowsheet data found.

## 2020-04-25 NOTE — Progress Notes (Signed)
IR consulted by Dr. Waldron Labs for possible image-guided Port-a-cath placement.  Patient incidentally found to have COVID-19 04/15/2020, on COVID-19 isolation/precautions until 04/26/2020. Plan for image-guided Port-a-cath placement in IR tentatively for tomorrow 04/26/2020 pending IR scheduling. Patient will be NPO at midnight in anticipation for possible procedure. Formal consult/consent to follow once off COVID-19 isolation/precautions.  Please call IR with questions/concerns.   Bea Graff Yoali Conry, PA-C 04/25/2020, 10:40 AM

## 2020-04-25 NOTE — Progress Notes (Signed)
PROGRESS NOTE                                                                                                                                                                                                             Patient Demographics:    Elizabeth Chase, is a 80 y.o. female, DOB - January 09, 1941, QPR:[id number]  Outpatient Primary MD for the patient is No primary care provider on file.    LOS - 10  Admit date - 04/14/2020    Chief Complaint  Patient presents with  . Breast Infection        Brief Narrative (HPI from H&P)    - FUJIE DICKISON is a 80 y.o. female with medical history significant of hypertension and hyperlipidemia who presented episode of syncope where she stood up to walk and almost passed out, was caught by her daughter on her way down, lost consciousness for a few minutes with no bowel or bladder incontinence, in the ER she was found to be dehydrated and hypotensive, on exam she was found to have a fungating right breast mass, imaging suggested colitis along with right breast malignancy and she was admitted to the hospital.   Subjective:   Patient in bed, she denies any complaints today, no chest pain, no chest pain, no fever or chills.     Assessment  & Plan :   Syncope due to hypotension.  - Caused by dehydration and possible mild colitis.  Currently no diarrhea, has been hydrated with IV fluids and is feeling better already, continue hydration, PT OT and monitor.  Head CT is nonacute and exam is nonfocal   colitis.   -resolved received with IV Flagyl and cephalosporin initially, currently off antibiotics  Breast cancer with fungating right breast mass with right axillary lymphadenopathy.  -History significant for invasive ductal carcinoma. -I have discussed with oncology, patient will need chemotherapy, then followed by surgery and radiation, . -2D echo was obtained per oncology recommendation, IR  consulted, plan for Port-A-Cath placement tomorrow.  Left wrist swelling appears to be a hematoma at the site of IV.  IV removed, stable X-Ray.  Much improved.  Fully vaccinated with incidental Covid infection.   - 3 days of remdesivir.  Elevated D-dimer.  - CTA lungs is negative.  Venous Dopplers is negative for DVT.   Poor social situation.  Homeless.  Social work following.       Condition - Extremely Guarded  Family Communication  :  None at  Bedside.  Code Status :  Full  Consults  :  CCS, Onc  PUD Prophylaxis :    Procedures  :     Right breast excision biopsy by general surgery on 04/18/2020.    CT head - Non acute  CT Chest - Abd - Pelvis - No evidence of pulmonary embolism. Large RIGHT breast mass 8.1 cm greatest size most consistent with malignancy with associated RIGHT axillary adenopathy. Additional small RIGHT breast nodule 14 x 7 mm question mass versus intramammary node. COPD. Descending colitis. Sigmoid diverticulosis without evidence of diverticulitis. Infrarenal abdominal aortic aneurysm 4.2 cm in greatest axial dimensions      Disposition Plan  :    Status is: Inpatient  Remains inpatient appropriate because:IV treatments appropriate due to intensity of illness or inability to take PO   Dispo: The patient is from: homeless              Anticipated d/c is to: Patient is homeless, awaiting safe disposition plan              Patient currently is medically stable to d/c.   Difficult to place patient Yes  DVT Prophylaxis  :   Heparin - SCDs   Lab Results  Component Value Date   PLT 427 (H) 04/24/2020    Diet :  Diet Order            Diet NPO time specified Except for: Sips with Meds  Diet effective midnight           Diet regular Room service appropriate? Yes; Fluid consistency: Thin  Diet effective now                  Inpatient Medications  Scheduled Meds: . vitamin C  500 mg Oral Daily  . atorvastatin  20 mg Oral q1800  .  fluticasone furoate-vilanterol  1 puff Inhalation Daily  . heparin injection (subcutaneous)  5,000 Units Subcutaneous Q8H  . lidocaine-EPINEPHrine  20 mL Intradermal Once  . sodium chloride flush  3 mL Intravenous Q12H  . zinc sulfate  220 mg Oral Daily   Continuous Infusions:  PRN Meds:.acetaminophen **OR** [DISCONTINUED] acetaminophen, albuterol, guaiFENesin-dextromethorphan, silver nitrate applicators, traZODone  Antibiotics  :    Anti-infectives (From admission, onward)   Start     Dose/Rate Route Frequency Ordered Stop   04/18/20 1400  metroNIDAZOLE (FLAGYL) tablet 500 mg  Status:  Discontinued        500 mg Oral Every 8 hours 04/18/20 1049 04/21/20 1403   04/16/20 1000  remdesivir 100 mg in sodium chloride 0.9 % 100 mL IVPB       "Followed by" Linked Group Details   100 mg 200 mL/hr over 30 Minutes Intravenous Daily 04/15/20 1203 04/17/20 1109   04/15/20 1915  metroNIDAZOLE (FLAGYL) IVPB 500 mg  Status:  Discontinued        500 mg 100 mL/hr over 60 Minutes Intravenous Every 8 hours 04/15/20 1822 04/18/20 1049   04/15/20 1630  ceFAZolin (ANCEF) IVPB 1 g/50 mL premix  Status:  Discontinued        1 g 100 mL/hr over 30 Minutes Intravenous Every 8 hours 04/15/20 1154 04/18/20 1049   04/15/20 1400  remdesivir 200 mg in sodium chloride 0.9% 250 mL IVPB       "Followed by" Linked Group Details  200 mg 580 mL/hr over 30 Minutes Intravenous Once 04/15/20 1203 04/15/20 2101   04/15/20 0745  ceFAZolin (ANCEF) IVPB 1 g/50 mL premix        1 g 100 mL/hr over 30 Minutes Intravenous  Once 04/15/20 0732 04/15/20 0902        Lawrence Mitch M.D on 04/25/2020 at 12:16 PM  To page go to [email or web address]   [facility] -  Office  [phone]    See all Orders from today for further details    Objective:   Vitals:   04/24/20 0645 04/24/20 1442 04/24/20 2100 04/25/20 0600  BP: (!) 155/77 (!) 135/58 (!) 166/78 (!) 142/70  Pulse: 77 72 68 67  Resp: 20 20 18 18   Temp: 98.4 F  (36.9 C) 98.1 F (36.7 C) 98.2 F (36.8 C) 97.7 F (36.5 C)  TempSrc: Oral Oral Oral Oral  SpO2: 98% 96% 96% 94%  Weight:      Height:        Wt Readings from Last 3 Encounters:  04/15/20 54.9 kg  03/01/17 (!) 558.7 kg  01/15/17 51.9 kg    No intake or output data in the 24 hours ending 04/25/20 1216   Physical Exam  Awake Alert, Oriented X 3, frail,No new F.N deficits, Normal affect Symmetrical Chest wall movement, Good air movement bilaterally, CTAB RRR,No Gallops,Rubs or new Murmurs, No Parasternal Heave +ve B.Sounds, Abd Soft, No tenderness, No rebound - guarding or rigidity. No Cyanosis, Clubbing or edema, No new Rash or bruise         Data Review:    CBC Recent Labs  Lab 04/19/20 0010 04/20/20 0049 04/24/20 0137  WBC 6.0 6.3 6.1  HGB 10.2* 10.5* 11.0*  HCT 31.8* 31.2* 34.2*  PLT 349 368 427*  MCV 89.8 87.9 90.5  MCH 28.8 29.6 29.1  MCHC 32.1 33.7 32.2  RDW 13.2 13.3 14.3  LYMPHSABS 1.1 1.3  --   MONOABS 0.6 0.6  --   EOSABS 0.5 0.4  --   BASOSABS 0.0 0.1  --     Recent Labs  Lab 04/19/20 0010 04/20/20 0049 04/24/20 0137  NA 138 138 139  K 3.7 4.2 3.8  CL 105 106 105  CO2 25 21* 26  GLUCOSE 127* 110* 107*  BUN 12 13 11   CREATININE 0.64 0.75 0.79  CALCIUM 8.4* 8.4* 8.8*  AST 23 24  --   ALT 13 11  --   ALKPHOS 41 43  --   BILITOT 0.4 0.5  --   ALBUMIN 2.6* 2.7*  --   MG 1.8 1.8  --   CRP 4.3* 2.2*  --   DDIMER 1.94* 1.85*  --   BNP 48.3 35.4  --     ------------------------------------------------------------------------------------------------------------------ No results for input(s): CHOL, HDL, LDLCALC, TRIG, CHOLHDL, LDLDIRECT in the last 72 hours.  Lab Results  Component Value Date   HGBA1C 5.9 (H) 01/02/2017   ------------------------------------------------------------------------------------------------------------------ No results for input(s): TSH, T4TOTAL, T3FREE, THYROIDAB in the last 72 hours.  Invalid input(s):  FREET3  Cardiac Enzymes No results for input(s): CKMB, TROPONINI, MYOGLOBIN in the last 168 hours.  Invalid input(s): CK ------------------------------------------------------------------------------------------------------------------    Component Value Date/Time   BNP 35.4 04/20/2020 0049    Micro Results No results found for this or any previous visit (from the past 240 hour(s)).  Radiology Reports DG Wrist Complete Left  Result Date: 04/16/2020 CLINICAL DATA:  Left wrist swelling. EXAM: LEFT WRIST - COMPLETE 3+ VIEW COMPARISON:  None.  FINDINGS: Degenerative changes between the base of the first metacarpal and trapezium. Degenerative changes between the trapezium and scaphoid. Soft tissue swelling best seen on the lateral view. No fractures or bony erosions identified. IMPRESSION: Soft tissue swelling.  Degenerative changes as above. Electronically Signed   By: Dorise Bullion III M.D   On: 04/16/2020 12:33   CT Head Wo Contrast  Result Date: 04/15/2020 CLINICAL DATA:  Syncope with normal neuro exam EXAM: CT HEAD WITHOUT CONTRAST TECHNIQUE: Contiguous axial images were obtained from the base of the skull through the vertex without intravenous contrast. COMPARISON:  Brain MRI 01/02/2017 FINDINGS: Brain: No evidence of acute infarction, hemorrhage, hydrocephalus, extra-axial collection or mass effect. 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 Stable calcification in the right posterior frontal white matter age congruent cerebral volume loss. Known meningioma at the right vertex again measuring 9 mm. Vascular: No hyperdense vessel or unexpected calcification. Skull: Normal. Negative for fracture or focal lesion. Sinuses/Orbits: No acute finding. IMPRESSION: No acute finding or change from 2018 Electronically Signed   By: Monte Fantasia M.D.   On: 04/15/2020 08:42   CT Angio Chest PE W and/or Wo Contrast  Result Date: 04/15/2020 CLINICAL DATA:  Abdominal  pain, nonlocalized, weakness, fatigue, chronic RIGHT breast infection for years drainage EXAM: CT ANGIOGRAPHY CHEST CT ABDOMEN AND PELVIS WITH CONTRAST TECHNIQUE: Multidetector CT imaging of the chest was performed using the standard protocol during bolus administration of intravenous contrast. Multiplanar CT image reconstructions and MIPs were obtained to evaluate the vascular anatomy. Multidetector CT imaging of the abdomen and pelvis was performed using the standard protocol during bolus administration of intravenous contrast. CONTRAST:  144mL OMNIPAQUE IOHEXOL 350 MG/ML SOLN IV. No oral contrast. COMPARISON:  None FINDINGS: CTA CHEST FINDINGS Cardiovascular: Atherosclerotic calcifications aorta, coronary arteries, and proximal great vessels. Aorta normal caliber without aneurysm or dissection. Heart unremarkable. No pericardial effusion. Pulmonary arteries adequately opacified and patent. No evidence of pulmonary embolism. Mediastinum/Nodes: Base of cervical region normal appearance. Esophagus unremarkable. Large lobulated soft tissue mass identified at RIGHT breast extending through skin surface, 8.1 x 4.9 x 7.1 cm in size most consistent with malignancy rather than infection. Mass extends to the chest wall with loss of fat plane between the mass and the underlying pectoralis muscle. Additional small nodule within RIGHT breast 14 x 7 mm question second mass versus lymph node. RIGHT axillary adenopathy with nodes measuring up to 18 mm short axis. Normal sized mediastinal lymph nodes. Lungs/Pleura: Emphysematous changes. Dependent atelectasis. Lungs otherwise clear. No pulmonary infiltrate, pleural effusion, or pneumothorax. No pulmonary mass/nodule. Musculoskeletal: No acute osseous findings. Review of the MIP images confirms the above findings. CT ABDOMEN and PELVIS FINDINGS Hepatobiliary: Gallbladder and liver normal appearance Pancreas: Atrophic without focal mass Spleen: Normal appearance Adrenals/Urinary  Tract: Cortical scarring at inferior pole LEFT kidney. Adrenal glands, kidneys, ureters, and bladder otherwise normal appearance Stomach/Bowel: Appendix not visualized, no pericecal inflammatory process seen. Stomach decompressed. Bowel wall thickening of descending colon with mild surrounding infiltrative changes over a long length favoring descending colitis. Minimal thickening of LEFT lateral conal fascia. Sigmoid diverticulosis without evidence of diverticulitis. Remaining bowel loops unremarkable. Vascular/Lymphatic: Atherosclerotic calcifications aorta, iliac arteries, visceral arteries. Infrarenal abdominal aortic aneurysm 4.2 x 4.0 cm in greatest axial dimensions extending 6.6 cm length, terminating above bifurcation. Moderate thrombus. No perianeurysmal infiltration/hemorrhage. No adenopathy. Reproductive: Atrophic uterus with unremarkable adnexa Other: No free air or free fluid.  No hernia. Musculoskeletal: Osseous demineralization. Review of the MIP  images confirms the above findings. IMPRESSION: No evidence of pulmonary embolism. Large RIGHT breast mass 8.1 cm greatest size most consistent with malignancy with associated RIGHT axillary adenopathy. Additional small RIGHT breast nodule 14 x 7 mm question mass versus intramammary node. COPD. Descending colitis. Sigmoid diverticulosis without evidence of diverticulitis. Infrarenal abdominal aortic aneurysm 4.2 cm in greatest axial dimensions; Recommend follow-up every 12 months and vascular consultation. This recommendation follows ACR consensus guidelines: White Paper of the ACR Incidental Findings Committee II on Vascular Findings. J Am Coll Radiol 2013; 10:789-794. Emphysema (ICD10-J43.9). Aortic Atherosclerosis (ICD10-I70.0). Aortic aneurysm NOS (ICD10-I71.9).: Findings called to Dr.  Francia Greaves on 04/15/2020 at 0857 hours. Electronically Signed   By: Lavonia Dana M.D.   On: 04/15/2020 08:58   CT ABDOMEN PELVIS W CONTRAST  Result Date: 04/15/2020 CLINICAL  DATA:  Abdominal pain, nonlocalized, weakness, fatigue, chronic RIGHT breast infection for years drainage EXAM: CT ANGIOGRAPHY CHEST CT ABDOMEN AND PELVIS WITH CONTRAST TECHNIQUE: Multidetector CT imaging of the chest was performed using the standard protocol during bolus administration of intravenous contrast. Multiplanar CT image reconstructions and MIPs were obtained to evaluate the vascular anatomy. Multidetector CT imaging of the abdomen and pelvis was performed using the standard protocol during bolus administration of intravenous contrast. CONTRAST:  19mL OMNIPAQUE IOHEXOL 350 MG/ML SOLN IV. No oral contrast. COMPARISON:  None FINDINGS: CTA CHEST FINDINGS Cardiovascular: Atherosclerotic calcifications aorta, coronary arteries, and proximal great vessels. Aorta normal caliber without aneurysm or dissection. Heart unremarkable. No pericardial effusion. Pulmonary arteries adequately opacified and patent. No evidence of pulmonary embolism. Mediastinum/Nodes: Base of cervical region normal appearance. Esophagus unremarkable. Large lobulated soft tissue mass identified at RIGHT breast extending through skin surface, 8.1 x 4.9 x 7.1 cm in size most consistent with malignancy rather than infection. Mass extends to the chest wall with loss of fat plane between the mass and the underlying pectoralis muscle. Additional small nodule within RIGHT breast 14 x 7 mm question second mass versus lymph node. RIGHT axillary adenopathy with nodes measuring up to 18 mm short axis. Normal sized mediastinal lymph nodes. Lungs/Pleura: Emphysematous changes. Dependent atelectasis. Lungs otherwise clear. No pulmonary infiltrate, pleural effusion, or pneumothorax. No pulmonary mass/nodule. Musculoskeletal: No acute osseous findings. Review of the MIP images confirms the above findings. CT ABDOMEN and PELVIS FINDINGS Hepatobiliary: Gallbladder and liver normal appearance Pancreas: Atrophic without focal mass Spleen: Normal appearance  Adrenals/Urinary Tract: Cortical scarring at inferior pole LEFT kidney. Adrenal glands, kidneys, ureters, and bladder otherwise normal appearance Stomach/Bowel: Appendix not visualized, no pericecal inflammatory process seen. Stomach decompressed. Bowel wall thickening of descending colon with mild surrounding infiltrative changes over a long length favoring descending colitis. Minimal thickening of LEFT lateral conal fascia. Sigmoid diverticulosis without evidence of diverticulitis. Remaining bowel loops unremarkable. Vascular/Lymphatic: Atherosclerotic calcifications aorta, iliac arteries, visceral arteries. Infrarenal abdominal aortic aneurysm 4.2 x 4.0 cm in greatest axial dimensions extending 6.6 cm length, terminating above bifurcation. Moderate thrombus. No perianeurysmal infiltration/hemorrhage. No adenopathy. Reproductive: Atrophic uterus with unremarkable adnexa Other: No free air or free fluid.  No hernia. Musculoskeletal: Osseous demineralization. Review of the MIP images confirms the above findings. IMPRESSION: No evidence of pulmonary embolism. Large RIGHT breast mass 8.1 cm greatest size most consistent with malignancy with associated RIGHT axillary adenopathy. Additional small RIGHT breast nodule 14 x 7 mm question mass versus intramammary node. COPD. Descending colitis. Sigmoid diverticulosis without evidence of diverticulitis. Infrarenal abdominal aortic aneurysm 4.2 cm in greatest axial dimensions; Recommend follow-up every 12 months and vascular consultation. This  recommendation follows ACR consensus guidelines: White Paper of the ACR Incidental Findings Committee II on Vascular Findings. J Am Coll Radiol 2013; 10:789-794. Emphysema (ICD10-J43.9). Aortic Atherosclerosis (ICD10-I70.0). Aortic aneurysm NOS (ICD10-I71.9).: Findings called to Dr.  Francia Greaves on 04/15/2020 at 0857 hours. Electronically Signed   By: Lavonia Dana M.D.   On: 04/15/2020 08:58   DG Chest Port 1 View  Result Date:  04/15/2020 CLINICAL DATA:  Recent syncopal episode EXAM: PORTABLE CHEST 1 VIEW COMPARISON:  None. FINDINGS: Cardiac shadows within normal limits. Aortic calcifications are noted. Lungs are hyperinflated bilaterally. Rounded density is noted in the right base measuring proximally 6 cm. This may represent fluid within the major fissure although the possibility of underlying mass deserves consideration. CT of the chest with contrast is recommended for further evaluation. IMPRESSION: Rounded density in the right lung base suspicious for underlying mass. CT is recommended for further evaluation. Electronically Signed   By: Inez Catalina M.D.   On: 04/15/2020 07:36   VAS [place] LOWER EXTREMITY VENOUS (DVT)  Result Date: 04/16/2020  Lower Venous DVT Study Indications: Rapidly rising D-dimer & Covid+.  Comparison Study: No previous exams Performing Technologist: Rogelia Rohrer  Examination Guidelines: A complete evaluation includes B-mode imaging, spectral Doppler, color Doppler, and power Doppler as needed of all accessible portions of each vessel. Bilateral testing is considered an integral part of a complete examination. Limited examinations for reoccurring indications may be performed as noted. The reflux portion of the exam is performed with the patient in reverse Trendelenburg.  +---------+---------------+---------+-----------+----------+--------------+ RIGHT    CompressibilityPhasicitySpontaneityPropertiesThrombus Aging +---------+---------------+---------+-----------+----------+--------------+ CFV      Full           Yes      Yes                                 +---------+---------------+---------+-----------+----------+--------------+ SFJ      Full                                                        +---------+---------------+---------+-----------+----------+--------------+ FV Prox  Full           Yes      Yes                                  +---------+---------------+---------+-----------+----------+--------------+ FV Mid   Full           Yes      Yes                                 +---------+---------------+---------+-----------+----------+--------------+ FV DistalFull           Yes      Yes                                 +---------+---------------+---------+-----------+----------+--------------+ PFV      Full                                                        +---------+---------------+---------+-----------+----------+--------------+  POP      Full           Yes      Yes                                 +---------+---------------+---------+-----------+----------+--------------+ PTV      Full                                                        +---------+---------------+---------+-----------+----------+--------------+ PERO     Full                                                        +---------+---------------+---------+-----------+----------+--------------+   +---------+---------------+---------+-----------+----------+--------------+ LEFT     CompressibilityPhasicitySpontaneityPropertiesThrombus Aging +---------+---------------+---------+-----------+----------+--------------+ CFV      Full                                                        +---------+---------------+---------+-----------+----------+--------------+ SFJ      Full                                                        +---------+---------------+---------+-----------+----------+--------------+ FV Prox  Full           Yes      Yes                                 +---------+---------------+---------+-----------+----------+--------------+ FV Mid   Full           Yes      Yes                                 +---------+---------------+---------+-----------+----------+--------------+ FV DistalFull           Yes      Yes                                  +---------+---------------+---------+-----------+----------+--------------+ PFV      Full                                                        +---------+---------------+---------+-----------+----------+--------------+ POP      Full           Yes      Yes                                 +---------+---------------+---------+-----------+----------+--------------+  PTV      Full                                                        +---------+---------------+---------+-----------+----------+--------------+ PERO     Full                                                        +---------+---------------+---------+-----------+----------+--------------+     Summary: BILATERAL: - No evidence of deep vein thrombosis seen in the lower extremities, bilaterally. - No evidence of superficial venous thrombosis in the lower extremities, bilaterally. -No evidence of popliteal cyst, bilaterally.   *See table(s) above for measurements and observations. Electronically signed by Jamelle Haring on 04/16/2020 at 5:48:36 PM.    Final    ECHOCARDIOGRAM LIMITED  Result Date: 04/23/2020    ECHOCARDIOGRAM LIMITED REPORT   Patient Name:   Elizabeth Chase Date of Exam: 04/23/2020 Medical Rec #:  [id number]     Height:       65.0 in Accession #:    [id number]    Weight:       121.0 lb Date of Birth:  January 23, 1941     BSA:          1.598 m Patient Age:    41 years      BP:           123/78 mmHg Patient Gender: F             HR:           68 bpm. Exam Location:  Inpatient Procedure: Limited Echo, Limited Color Doppler, Strain Analysis and Cardiac            Doppler Indications:    breast cancer  History:        Patient has prior history of Echocardiogram examinations, most                 recent 01/02/2017. Covid; Risk Factors:Hypertension and                 Dyslipidemia.  Sonographer:    Johny Chess Referring Phys: [address] [place]  1. Focused echo perfomed in setting of COVID + status.  2. Left  ventricular ejection fraction, by estimation, is 55%. The left ventricle has normal function. Left ventricular diastolic parameters are consistent with Grade I diastolic dysfunction (impaired relaxation).  3. Right ventricular systolic function is normal. The right ventricular size is normal.  4. Aortic valve regurgitation is trivial.  5. The inferior vena cava is normal in size with greater than 50% respiratory variability, suggesting right atrial pressure of 3 mmHg. FINDINGS  Left Ventricle: Left ventricular ejection fraction, by estimation, is 55%. The left ventricle has normal function. Global longitudinal strain performed but not reported based on interpreter judgement due to suboptimal tracking. Left ventricular diastolic parameters are consistent with Grade I diastolic dysfunction (impaired relaxation). Right Ventricle: The right ventricular size is normal. No increase in right ventricular wall thickness. Right ventricular systolic function is normal. Pericardium: Trivial pericardial effusion is present. Aortic Valve: Aortic valve regurgitation is trivial. Venous: The inferior vena cava is normal in size with  greater than 50% respiratory variability, suggesting right atrial pressure of 3 mmHg.  Diastology LV e' medial:    4.03 cm/s LV E/e' medial:  16.8 LV e' lateral:   4.46 cm/s LV E/e' lateral: 15.2  IVC IVC diam: 1.10 cm LEFT ATRIUM           Index LA Vol (A4C): 21.3 ml 13.33 ml/m  AORTIC VALVE LVOT Vmax:   90.00 cm/s LVOT Vmean:  58.100 cm/s LVOT VTI:    0.169 m MV E velocity: 67.90 cm/s MV A velocity: 141.00 cm/s  SHUNTS MV E/A ratio:  0.48         Systemic VTI: 0.17 m Cherlynn Kaiser MD Electronically signed by Cherlynn Kaiser MD Signature Date/Time: 04/23/2020/4:37:22 PM    Final

## 2020-04-25 NOTE — Plan of Care (Signed)
Pt reports 0/10 pain. Pt R breast wound changed at 2100. Pt moves independently.   Problem: Education: Goal: Knowledge of risk factors and measures for prevention of condition will improve Outcome: Progressing   Problem: Respiratory: Goal: Complications related to the disease process, condition or treatment will be avoided or minimized Outcome: Progressing   Problem: Coping: Goal: Psychosocial and spiritual needs will be supported Outcome: Completed/Met   Problem: Respiratory: Goal: Will maintain a patent airway Outcome: Completed/Met

## 2020-04-26 ENCOUNTER — Inpatient Hospital Stay (HOSPITAL_COMMUNITY): Payer: Medicare HMO

## 2020-04-26 DIAGNOSIS — C50911 Malignant neoplasm of unspecified site of right female breast: Secondary | ICD-10-CM | POA: Diagnosis not present

## 2020-04-26 DIAGNOSIS — Z170 Estrogen receptor positive status [ER+]: Secondary | ICD-10-CM

## 2020-04-26 DIAGNOSIS — R55 Syncope and collapse: Secondary | ICD-10-CM | POA: Diagnosis not present

## 2020-04-26 HISTORY — PX: IR US GUIDE VASC ACCESS RIGHT: IMG2390

## 2020-04-26 HISTORY — PX: IR IMAGING GUIDED PORT INSERTION: IMG5740

## 2020-04-26 MED ORDER — CEFAZOLIN SODIUM-DEXTROSE 2-4 GM/100ML-% IV SOLN
INTRAVENOUS | Status: AC
  Filled 2020-04-26: qty 100

## 2020-04-26 MED ORDER — MIDAZOLAM HCL 2 MG/2ML IJ SOLN
INTRAMUSCULAR | Status: AC | PRN
  Administered 2020-04-26 (×2): 1 mg via INTRAVENOUS

## 2020-04-26 MED ORDER — FENTANYL CITRATE (PF) 100 MCG/2ML IJ SOLN
INTRAMUSCULAR | Status: AC | PRN
  Administered 2020-04-26 (×2): 25 ug via INTRAVENOUS

## 2020-04-26 MED ORDER — LIDOCAINE-EPINEPHRINE 1 %-1:100000 IJ SOLN
INTRAMUSCULAR | Status: AC | PRN
  Administered 2020-04-26: 10 mL

## 2020-04-26 MED ORDER — FENTANYL CITRATE (PF) 100 MCG/2ML IJ SOLN
INTRAMUSCULAR | Status: AC
  Filled 2020-04-26: qty 2

## 2020-04-26 MED ORDER — MIDAZOLAM HCL 2 MG/2ML IJ SOLN
INTRAMUSCULAR | Status: AC
  Filled 2020-04-26: qty 2

## 2020-04-26 MED ORDER — HEPARIN SOD (PORK) LOCK FLUSH 100 UNIT/ML IV SOLN
INTRAVENOUS | Status: AC
  Filled 2020-04-26: qty 5

## 2020-04-26 MED ORDER — LIDOCAINE-EPINEPHRINE 1 %-1:100000 IJ SOLN
INTRAMUSCULAR | Status: AC
  Filled 2020-04-26: qty 1

## 2020-04-26 NOTE — Care Management Important Message (Signed)
Important Message  Patient Details  Name: Elizabeth Chase MRN: [id number] Date of Birth: Sep 29, 1940   Medicare Important Message Given:  Yes - Important Message mailed due to current National Emergency  Verbal consent obtained due to current National Emergency  Relationship to patient: Self Contact Name: Carolyn Call Date: 04/26/20  Time: 1501 Phone: [phone] Outcome: Spoke with contact Important Message mailed to: Patient address on file    [name] 04/26/2020, 3:01 PM

## 2020-04-26 NOTE — Procedures (Signed)
Interventional Radiology Procedure Note  Procedure: Placement of a right IJ approach single lumen PowerPort.  Tip is positioned at the superior cavoatrial junction and catheter is ready for immediate use.  Complications: No immediate Recommendations:  - Ok to shower tomorrow - Do not submerge for 7 days - Routine line care   Signed,  Randon Somera K. Symantha Steeber, MD   

## 2020-04-26 NOTE — Assessment & Plan Note (Signed)
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 Neoadj chemo with TCHP 2. Mastectomy 3. Adj XRT 4. Adj Anti estrogen therapy  Chemotherapy Counseling: I discussed the risks and benefits of chemotherapy including the risks of nausea/ vomiting, risk of infection from low WBC count, fatigue due to chemo or anemia, bruising or bleeding due to low platelets, mouth sores, loss/ change in taste and decreased appetite. Liver and kidney function will be monitored through out chemotherapy as abnormalities in liver and kidney function may be a side effect of treatment. Cardiac dysfunction due to  Herceptin and Perjeta were discussed in detail. Risk of permanent bone marrow dysfunction and leukemia due to chemo were also discussed.

## 2020-04-26 NOTE — Discharge Summary (Signed)
Elizabeth Chase, is a 80 y.o. female  DOB 01-24-1941  MRN [id number].  Admission date:  04/14/2020  Admitting Physician  Norval Morton, MD  Discharge Date:  04/26/2020   Primary MD  No primary care provider on file.  Recommendations for primary care physician for things to follow:  -Patient to follow with oncology as an outpatient regarding chemotherapy.   Admission Diagnosis  Syncope and collapse [R55] Dehydration [E86.0] Breast mass, right [N63.10] Syncope, unspecified syncope type [R55] Abdominal aortic aneurysm (AAA) without rupture ([place]) [I71.4] COVID-19 [U07.1]   Discharge Diagnosis  Syncope and collapse [R55] Dehydration [E86.0] Breast mass, right [N63.10] Syncope, unspecified syncope type [R55] Abdominal aortic aneurysm (AAA) without rupture ([place]) [I71.4] COVID-19 [U07.1]    Principal Problem:   Syncope and collapse Active Problems:   Essential hypertension   Breast mass, right   Prolonged QT interval   AKI (acute kidney injury) ([place])   Colitis   COVID-19 virus infection      Past Medical History:  Diagnosis Date  . Hypertension   . Stroke [facility])     History reviewed. No pertinent surgical history.     History of present illness and  Hospital Course:     Kindly see H&P for history of present illness and admission details, please review complete Labs, Consult reports and Test reports for all details in brief  HPI  from the history and physical done on the day of admission 04/14/2020    HPI: Elizabeth Chase is a 80 y.o. female with medical history significant of hypertension and hyperlipidemia who presented with complaints of passing out last night.  She had gotten out of the car to go into [employer] and the next thing she recalls is waking up on the ground.  Denies any trauma to her head. Her daughter was present at the time and caught her before she fell.  She reportedly  only lost consciousness for short period in time.Yesterday she had been feeling weak and fatigued with associated symptoms of upset stomach with nausea.  Denies having any fever, vomiting, headache, cough, shortness of breath, chest pain, or diarrhea.  She had received her initial Covid vaccines, but not the booster.  Patient also reports that she has had this mass on her right breast that intermittently bleeds for quite some time, but states it was never worked up before the past.  ED Course: Upon admission into the emergency department patient was seen to be afebrile with blood pressures 90/53-132/62, and O2 saturations maintained on room air.  Labs from 3/3-3/4 significant for BUN 18, creatinine 1.06, glucose 212, high-sensitivity troponin negative x2, and lactic acid 2.2->1.7.  Patient's TMAUQ-33 screening was positive. Chest x-ray was significant for right lung base density concerning for underlying mass.  CT scans of the chest, abdomen, and pelvis were significant for right breast mass measuring 8.1 cm with associated right axillary adenopathy, right breast nodule 14 x 7 mm versus intramammary node descending colitis, and infrarenal abdominal aortic aneurysm measuring 4.2 cm in greatest  dimensions, blood cultures had been obtained.  Patient had been given cefazolin 1 g IV, normal saline 500 mL bolus, and then placement rate of 125/h for at least 1 L.  TRH called to admit.  Hospital Course   Syncope due to hypotension.  - Caused by dehydration and possible mild colitis.  Currently no diarrhea, has been hydrated with IV fluids and is feeling better already, with good oral intake, no further dizziness or lightheadedness, lisinopril has been discontinued on discharge as blood pressure has been acceptable off medications .   colitis.   -resolved received with IV Flagyl and cephalosporin initially, currently off antibiotics  Breast cancer with fungating right breast mass with right axillary  lymphadenopathy.  -History significant for invasive ductal carcinoma. -I have discussed with oncology, patient will need chemotherapy, then followed by surgery and radiation, . -2D echo was obtained per oncology recommendation, IR consulted,  port catheter placed Feliz/15/2021.    Left wrist swelling appears to be a hematoma at the site of IV.  IV removed, stable X-Ray.    Resolved  Fully vaccinated with incidental Covid infection.   -Treated with 3 days of remdesivir.  Elevated D-dimer.  - CTA lungs is negative.  Venous Dopplers is negative for DVT.   Poor social situation.  Homeless.  seen by Education officer, museum, have discussed with the patient, reports she will be able to go today with her daughter.   Discharge Condition:  stable   Follow UP   Follow-up Information    Nicholas Lose, MD Follow up.   Specialty: Hematology and Oncology Contact information: [place] [id number] [phone]                 Discharge Instructions  and  Discharge Medications    Discharge Instructions    Diet - low sodium heart healthy   Complete by: As directed    Discharge wound care:   Complete by: As directed    Wound care to right breast mass:  Cleanse with soap and water, rinse and pat dry.  Cover lesion with folded pieces of xeroform gauze Kellie Simmering # 294). Top with ABD pad(s) and secure with breast binder.  Change twice daily and PRN drainage strike-through or dressing dislodgement.   Care regarding right Porta cath placment. Recommendations:  - Ok to shower tomorrow - Do not submerge for 7 days - Routine line care   Increase activity slowly   Complete by: As directed      Allergies as of 04/26/2020      Reactions   Bee Venom       Medication List    STOP taking these medications   aspirin 325 MG EC tablet   lisinopril 40 MG tablet Commonly known as: ZESTRIL     TAKE these medications   atorvastatin 20 MG tablet Commonly known as:  LIPITOR Take 1 tablet (20 mg total) by mouth daily at 6 PM.   Breo Ellipta 200-25 MCG/INH Aepb Generic drug: fluticasone furoate-vilanterol Inhale 1 puff into the lungs daily.   cetirizine 10 MG tablet Commonly known as: ZYRTEC Take 10 mg daily by mouth.   cyanocobalamin 1000 MCG tablet Take 1,000 mcg by mouth daily.   ergocalciferol 1.25 MG (50000 UT) capsule Commonly known as: VITAMIN D2 Take 50,000 Units by mouth once a week.   trazodone 300 MG tablet Commonly known as: DESYREL Take 300 mg by mouth at bedtime.  Discharge Care Instructions  (From admission, onward)         Start     Ordered   04/26/20 0000  Discharge wound care:       Comments: Wound care to right breast mass:  Cleanse with soap and water, rinse and pat dry.  Cover lesion with folded pieces of xeroform gauze Kellie Simmering # 294). Top with ABD pad(s) and secure with breast binder.  Change twice daily and PRN drainage strike-through or dressing dislodgement.   Care regarding right Porta cath placment. Recommendations:  - Ok to shower tomorrow - Do not submerge for 7 days - Routine line care   04/26/20 1215            Diet and Activity recommendation: See Discharge Instructions above   Consults obtained -  Oncology. General surgery   Major procedures and Radiology Reports - PLEASE review detailed and final reports for all details, in brief -   Right breast excision biopsy by general surgery on 04/18/2020.   Placement of a right IJ approach single lumen PowerPort by IR on 04/26/2020.   DG Wrist Complete Left  Result Date: 04/16/2020 CLINICAL DATA:  Left wrist swelling. EXAM: LEFT WRIST - COMPLETE 3+ VIEW COMPARISON:  None. FINDINGS: Degenerative changes between the base of the first metacarpal and trapezium. Degenerative changes between the trapezium and scaphoid. Soft tissue swelling best seen on the lateral view. No fractures or bony erosions identified. IMPRESSION: Soft tissue  swelling.  Degenerative changes as above. Electronically Signed   By: Dorise Bullion III M.D   On: 04/16/2020 12:33   CT Head Wo Contrast  Result Date: 04/15/2020 CLINICAL DATA:  Syncope with normal neuro exam EXAM: CT HEAD WITHOUT CONTRAST TECHNIQUE: Contiguous axial images were obtained from the base of the skull through the vertex without intravenous contrast. COMPARISON:  Brain MRI 01/02/2017 FINDINGS: Brain: No evidence of acute infarction, hemorrhage, hydrocephalus, extra-axial collection or mass effect. 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 Stable calcification in the right posterior frontal white matter age congruent cerebral volume loss. Known meningioma at the right vertex again measuring 9 mm. Vascular: No hyperdense vessel or unexpected calcification. Skull: Normal. Negative for fracture or focal lesion. Sinuses/Orbits: No acute finding. IMPRESSION: No acute finding or change from 2018 Electronically Signed   By: Monte Fantasia M.D.   On: 04/15/2020 08:42   CT Angio Chest PE W and/or Wo Contrast  Result Date: 04/15/2020 CLINICAL DATA:  Abdominal pain, nonlocalized, weakness, fatigue, chronic RIGHT breast infection for years drainage EXAM: CT ANGIOGRAPHY CHEST CT ABDOMEN AND PELVIS WITH CONTRAST TECHNIQUE: Multidetector CT imaging of the chest was performed using the standard protocol during bolus administration of intravenous contrast. Multiplanar CT image reconstructions and MIPs were obtained to evaluate the vascular anatomy. Multidetector CT imaging of the abdomen and pelvis was performed using the standard protocol during bolus administration of intravenous contrast. CONTRAST:  156mL OMNIPAQUE IOHEXOL 350 MG/ML SOLN IV. No oral contrast. COMPARISON:  None FINDINGS: CTA CHEST FINDINGS Cardiovascular: Atherosclerotic calcifications aorta, coronary arteries, and proximal great vessels. Aorta normal caliber without aneurysm or dissection. Heart  unremarkable. No pericardial effusion. Pulmonary arteries adequately opacified and patent. No evidence of pulmonary embolism. Mediastinum/Nodes: Base of cervical region normal appearance. Esophagus unremarkable. Large lobulated soft tissue mass identified at RIGHT breast extending through skin surface, 8.1 x 4.9 x 7.1 cm in size most consistent with malignancy rather than infection. Mass extends to the chest wall with loss of fat  plane between the mass and the underlying pectoralis muscle. Additional small nodule within RIGHT breast 14 x 7 mm question second mass versus lymph node. RIGHT axillary adenopathy with nodes measuring up to 18 mm short axis. Normal sized mediastinal lymph nodes. Lungs/Pleura: Emphysematous changes. Dependent atelectasis. Lungs otherwise clear. No pulmonary infiltrate, pleural effusion, or pneumothorax. No pulmonary mass/nodule. Musculoskeletal: No acute osseous findings. Review of the MIP images confirms the above findings. CT ABDOMEN and PELVIS FINDINGS Hepatobiliary: Gallbladder and liver normal appearance Pancreas: Atrophic without focal mass Spleen: Normal appearance Adrenals/Urinary Tract: Cortical scarring at inferior pole LEFT kidney. Adrenal glands, kidneys, ureters, and bladder otherwise normal appearance Stomach/Bowel: Appendix not visualized, no pericecal inflammatory process seen. Stomach decompressed. Bowel wall thickening of descending colon with mild surrounding infiltrative changes over a long length favoring descending colitis. Minimal thickening of LEFT lateral conal fascia. Sigmoid diverticulosis without evidence of diverticulitis. Remaining bowel loops unremarkable. Vascular/Lymphatic: Atherosclerotic calcifications aorta, iliac arteries, visceral arteries. Infrarenal abdominal aortic aneurysm 4.2 x 4.0 cm in greatest axial dimensions extending 6.6 cm length, terminating above bifurcation. Moderate thrombus. No perianeurysmal infiltration/hemorrhage. No adenopathy.  Reproductive: Atrophic uterus with unremarkable adnexa Other: No free air or free fluid.  No hernia. Musculoskeletal: Osseous demineralization. Review of the MIP images confirms the above findings. IMPRESSION: No evidence of pulmonary embolism. Large RIGHT breast mass 8.1 cm greatest size most consistent with malignancy with associated RIGHT axillary adenopathy. Additional small RIGHT breast nodule 14 x 7 mm question mass versus intramammary node. COPD. Descending colitis. Sigmoid diverticulosis without evidence of diverticulitis. Infrarenal abdominal aortic aneurysm 4.2 cm in greatest axial dimensions; Recommend follow-up every 12 months and vascular consultation. This recommendation follows ACR consensus guidelines: White Paper of the ACR Incidental Findings Committee II on Vascular Findings. J Am Coll Radiol 2013; 10:789-794. Emphysema (ICD10-J43.9). Aortic Atherosclerosis (ICD10-I70.0). Aortic aneurysm NOS (ICD10-I71.9).: Findings called to Dr.  Francia Greaves on 04/15/2020 at 0857 hours. Electronically Signed   By: Lavonia Dana M.D.   On: 04/15/2020 08:58   CT ABDOMEN PELVIS W CONTRAST  Result Date: 04/15/2020 CLINICAL DATA:  Abdominal pain, nonlocalized, weakness, fatigue, chronic RIGHT breast infection for years drainage EXAM: CT ANGIOGRAPHY CHEST CT ABDOMEN AND PELVIS WITH CONTRAST TECHNIQUE: Multidetector CT imaging of the chest was performed using the standard protocol during bolus administration of intravenous contrast. Multiplanar CT image reconstructions and MIPs were obtained to evaluate the vascular anatomy. Multidetector CT imaging of the abdomen and pelvis was performed using the standard protocol during bolus administration of intravenous contrast. CONTRAST:  175mL OMNIPAQUE IOHEXOL 350 MG/ML SOLN IV. No oral contrast. COMPARISON:  None FINDINGS: CTA CHEST FINDINGS Cardiovascular: Atherosclerotic calcifications aorta, coronary arteries, and proximal great vessels. Aorta normal caliber without aneurysm  or dissection. Heart unremarkable. No pericardial effusion. Pulmonary arteries adequately opacified and patent. No evidence of pulmonary embolism. Mediastinum/Nodes: Base of cervical region normal appearance. Esophagus unremarkable. Large lobulated soft tissue mass identified at RIGHT breast extending through skin surface, 8.1 x 4.9 x 7.1 cm in size most consistent with malignancy rather than infection. Mass extends to the chest wall with loss of fat plane between the mass and the underlying pectoralis muscle. Additional small nodule within RIGHT breast 14 x 7 mm question second mass versus lymph node. RIGHT axillary adenopathy with nodes measuring up to 18 mm short axis. Normal sized mediastinal lymph nodes. Lungs/Pleura: Emphysematous changes. Dependent atelectasis. Lungs otherwise clear. No pulmonary infiltrate, pleural effusion, or pneumothorax. No pulmonary mass/nodule. Musculoskeletal: No acute osseous findings. Review of the MIP images  confirms the above findings. CT ABDOMEN and PELVIS FINDINGS Hepatobiliary: Gallbladder and liver normal appearance Pancreas: Atrophic without focal mass Spleen: Normal appearance Adrenals/Urinary Tract: Cortical scarring at inferior pole LEFT kidney. Adrenal glands, kidneys, ureters, and bladder otherwise normal appearance Stomach/Bowel: Appendix not visualized, no pericecal inflammatory process seen. Stomach decompressed. Bowel wall thickening of descending colon with mild surrounding infiltrative changes over a long length favoring descending colitis. Minimal thickening of LEFT lateral conal fascia. Sigmoid diverticulosis without evidence of diverticulitis. Remaining bowel loops unremarkable. Vascular/Lymphatic: Atherosclerotic calcifications aorta, iliac arteries, visceral arteries. Infrarenal abdominal aortic aneurysm 4.2 x 4.0 cm in greatest axial dimensions extending 6.6 cm length, terminating above bifurcation. Moderate thrombus. No perianeurysmal  infiltration/hemorrhage. No adenopathy. Reproductive: Atrophic uterus with unremarkable adnexa Other: No free air or free fluid.  No hernia. Musculoskeletal: Osseous demineralization. Review of the MIP images confirms the above findings. IMPRESSION: No evidence of pulmonary embolism. Large RIGHT breast mass 8.1 cm greatest size most consistent with malignancy with associated RIGHT axillary adenopathy. Additional small RIGHT breast nodule 14 x 7 mm question mass versus intramammary node. COPD. Descending colitis. Sigmoid diverticulosis without evidence of diverticulitis. Infrarenal abdominal aortic aneurysm 4.2 cm in greatest axial dimensions; Recommend follow-up every 12 months and vascular consultation. This recommendation follows ACR consensus guidelines: White Paper of the ACR Incidental Findings Committee II on Vascular Findings. J Am Coll Radiol 2013; 10:789-794. Emphysema (ICD10-J43.9). Aortic Atherosclerosis (ICD10-I70.0). Aortic aneurysm NOS (ICD10-I71.9).: Findings called to Dr.  Francia Greaves on 04/15/2020 at 0857 hours. Electronically Signed   By: Lavonia Dana M.D.   On: 04/15/2020 08:58   DG Chest Port 1 View  Result Date: 04/15/2020 CLINICAL DATA:  Recent syncopal episode EXAM: PORTABLE CHEST 1 VIEW COMPARISON:  None. FINDINGS: Cardiac shadows within normal limits. Aortic calcifications are noted. Lungs are hyperinflated bilaterally. Rounded density is noted in the right base measuring proximally 6 cm. This may represent fluid within the major fissure although the possibility of underlying mass deserves consideration. CT of the chest with contrast is recommended for further evaluation. IMPRESSION: Rounded density in the right lung base suspicious for underlying mass. CT is recommended for further evaluation. Electronically Signed   By: Inez Catalina M.D.   On: 04/15/2020 07:36   VAS [place] LOWER EXTREMITY VENOUS (DVT)  Result Date: 04/16/2020  Lower Venous DVT Study Indications: Rapidly rising D-dimer &  Covid+.  Comparison Study: No previous exams Performing Technologist: Rogelia Rohrer  Examination Guidelines: A complete evaluation includes B-mode imaging, spectral Doppler, color Doppler, and power Doppler as needed of all accessible portions of each vessel. Bilateral testing is considered an integral part of a complete examination. Limited examinations for reoccurring indications may be performed as noted. The reflux portion of the exam is performed with the patient in reverse Trendelenburg.  +---------+---------------+---------+-----------+----------+--------------+ RIGHT    CompressibilityPhasicitySpontaneityPropertiesThrombus Aging +---------+---------------+---------+-----------+----------+--------------+ CFV      Full           Yes      Yes                                 +---------+---------------+---------+-----------+----------+--------------+ SFJ      Full                                                        +---------+---------------+---------+-----------+----------+--------------+  FV Prox  Full           Yes      Yes                                 +---------+---------------+---------+-----------+----------+--------------+ FV Mid   Full           Yes      Yes                                 +---------+---------------+---------+-----------+----------+--------------+ FV DistalFull           Yes      Yes                                 +---------+---------------+---------+-----------+----------+--------------+ PFV      Full                                                        +---------+---------------+---------+-----------+----------+--------------+ POP      Full           Yes      Yes                                 +---------+---------------+---------+-----------+----------+--------------+ PTV      Full                                                        +---------+---------------+---------+-----------+----------+--------------+ PERO      Full                                                        +---------+---------------+---------+-----------+----------+--------------+   +---------+---------------+---------+-----------+----------+--------------+ LEFT     CompressibilityPhasicitySpontaneityPropertiesThrombus Aging +---------+---------------+---------+-----------+----------+--------------+ CFV      Full                                                        +---------+---------------+---------+-----------+----------+--------------+ SFJ      Full                                                        +---------+---------------+---------+-----------+----------+--------------+ FV Prox  Full           Yes      Yes                                 +---------+---------------+---------+-----------+----------+--------------+ FV Mid  Full           Yes      Yes                                 +---------+---------------+---------+-----------+----------+--------------+ FV DistalFull           Yes      Yes                                 +---------+---------------+---------+-----------+----------+--------------+ PFV      Full                                                        +---------+---------------+---------+-----------+----------+--------------+ POP      Full           Yes      Yes                                 +---------+---------------+---------+-----------+----------+--------------+ PTV      Full                                                        +---------+---------------+---------+-----------+----------+--------------+ PERO     Full                                                        +---------+---------------+---------+-----------+----------+--------------+     Summary: BILATERAL: - No evidence of deep vein thrombosis seen in the lower extremities, bilaterally. - No evidence of superficial venous thrombosis in the lower extremities, bilaterally. -No evidence of  popliteal cyst, bilaterally.   *See table(s) above for measurements and observations. Electronically signed by Jamelle Haring on 04/16/2020 at 5:48:36 PM.    Final    ECHOCARDIOGRAM LIMITED  Result Date: 04/23/2020    ECHOCARDIOGRAM LIMITED REPORT   Patient Name:   Elizabeth Chase Date of Exam: 04/23/2020 Medical Rec #:  [id number]     Height:       65.0 in Accession #:    [id number]    Weight:       121.0 lb Date of Birth:  1940/04/25     BSA:          1.598 m Patient Age:    46 years      BP:           123/78 mmHg Patient Gender: F             HR:           68 bpm. Exam Location:  Inpatient Procedure: Limited Echo, Limited Color Doppler, Strain Analysis and Cardiac            Doppler Indications:    breast cancer  History:        Patient has prior history of Echocardiogram examinations, most  recent 01/02/2017. Covid; Risk Factors:Hypertension and                 Dyslipidemia.  Sonographer:    Johny Chess Referring Phys: 60 DAWOOD S Gar[place]  1. Focused echo perfomed in setting of COVID + status.  2. Left ventricular ejection fraction, by estimation, is 55%. The left ventricle has normal function. Left ventricular diastolic parameters are consistent with Grade I diastolic dysfunction (impaired relaxation).  3. Right ventricular systolic function is normal. The right ventricular size is normal.  4. Aortic valve regurgitation is trivial.  5. The inferior vena cava is normal in size with greater than 50% respiratory variability, suggesting right atrial pressure of 3 mmHg. FINDINGS  Left Ventricle: Left ventricular ejection fraction, by estimation, is 55%. The left ventricle has normal function. Global longitudinal strain performed but not reported based on interpreter judgement due to suboptimal tracking. Left ventricular diastolic parameters are consistent with Grade I diastolic dysfunction (impaired relaxation). Right Ventricle: The right ventricular size is normal. No increase in  right ventricular wall thickness. Right ventricular systolic function is normal. Pericardium: Trivial pericardial effusion is present. Aortic Valve: Aortic valve regurgitation is trivial. Venous: The inferior vena cava is normal in size with greater than 50% respiratory variability, suggesting right atrial pressure of 3 mmHg.  Diastology LV e' medial:    4.03 cm/s LV E/e' medial:  16.8 LV e' lateral:   4.46 cm/s LV E/e' lateral: 15.2  IVC IVC diam: 1.10 cm LEFT ATRIUM           Index LA Vol (A4C): 21.3 ml 13.33 ml/m  AORTIC VALVE LVOT Vmax:   90.00 cm/s LVOT Vmean:  58.100 cm/s LVOT VTI:    0.169 m MV E velocity: 67.90 cm/s MV A velocity: 141.00 cm/s  SHUNTS MV E/A ratio:  0.48         Systemic VTI: 0.17 m Cherlynn Kaiser MD Electronically signed by Cherlynn Kaiser MD Signature Date/Time: 04/23/2020/4:37:22 PM    Final     Micro Results    No results found for this or any previous visit (from the past 240 hour(s)).     Today   Subjective:   Harmonii Karle today has no headache,no chest abdominal pain,no new weakness tingling or numbness, feels much better wants to go home today.   Objective:   Blood pressure 122/66, pulse 76, temperature 97.8 F (36.6 C), temperature source Oral, resp. rate 16, height 5\' 5"  (1.651 m), weight 54.9 kg, SpO2 97 %.   Intake/Output Summary (Last 24 hours) at 04/26/2020 1219 Last data filed at 04/25/2020 2100 Gross per 24 hour  Intake 120 ml  Output -  Net 120 ml    Exam Awake Alert, Oriented x 3, No new F.N deficits, Normal affect Symmetrical Chest wall movement, Good air movement bilaterally, CTAB RRR,No Gallops,Rubs or new Murmurs, No Parasternal Heave +ve B.Sounds, Abd Soft, Non tender,  No rebound -guarding or rigidity. No Cyanosis, Clubbing or edema, No new Rash or bruise  Data Review   CBC w Diff:  Lab Results  Component Value Date   WBC 6.1 04/24/2020   HGB 11.0 (L) 04/24/2020   HCT 34.2 (L) 04/24/2020   PLT 427 (H) 04/24/2020   LYMPHOPCT  21 04/20/2020   MONOPCT 9 04/20/2020   EOSPCT 6 04/20/2020   BASOPCT 1 04/20/2020    CMP:  Lab Results  Component Value Date   NA 139 04/24/2020   K 3.8 04/24/2020   CL 105 04/24/2020  CO2 26 04/24/2020   BUN 11 04/24/2020   CREATININE 0.79 04/24/2020   PROT 5.6 (L) 04/20/2020   ALBUMIN 2.7 (L) 04/20/2020   BILITOT 0.5 04/20/2020   ALKPHOS 43 04/20/2020   AST 24 04/20/2020   ALT 11 04/20/2020  .   Total Time in preparing paper work, data evaluation and todays exam - 49 minutes  Phillips Climes M.D on 04/26/2020 at 12:19 PM  [facility]   Office  [phone]

## 2020-04-26 NOTE — Progress Notes (Signed)
  HEMATOLOGY-ONCOLOGY TELEPHONE VISIT PROGRESS NOTE  I connected with Elizabeth Chase on 04/27/2020 at  9:30 AM EDT by telephone and verified that I am speaking with the correct person using two identifiers.  I discussed the limitations, risks, security and privacy concerns of performing an evaluation and management service by telephone and the availability of in person appointments.  I also discussed with the patient that there may be a patient responsible charge related to this service. The patient expressed understanding and agreed to proceed.   History of Present Illness: Elizabeth Chase is a 80 y.o. female with above-mentioned history of HER-2 positive right breast cancer. She presents over the phone today with her daughter to discuss chemotherapy.   Oncology History  Malignant neoplasm of right breast in female, estrogen receptor positive ([place])  04/18/2020 Initial Diagnosis   Patient presented to the ED with complaint of syncope and found to have a fungating right breast mass. CT showed a right breast mass, 8.1cm, and right axillary adenopathy. Patient reported a right breast mass present for past 2-3 years. Biopsy showed IDC, grade 3, HER-2 equivocal by IHC, positive by FISH (ratio 3.56), ER+ 95%, PR+ 70%, ,Ki67 25%.    04/18/2020 Cancer Staging   Staging form: Breast, AJCC 8th Edition - Clinical stage from 04/18/2020: Stage IIIB (cT4b, cN1, cM0, G3, ER+, PR+, HER2+) - Signed by Gardenia Phlegm, NP on 04/27/2020 Stage prefix: Initial diagnosis Histologic grading system: 3 grade system     Observations/Objective:  Patient is currently living with some friends in [place].  She is expecting a trailer to be delivered to [place] sometime next week.   Assessment Plan:  Malignant neoplasm of right breast in female, estrogen receptor positive ([place]) Stage IIIc fungating tumor of the right breast: ER/PR and HER-2 positive 04/15/20: CT CAP: No distant mets  Treatment Plan: 1. Neoadj chemo  with Taxotere Herceptin Perjeta 2. Mastectomy with sentinel node biopsy 3. Adj XRT 4. Adj Anti estrogen therapy  Chemotherapy Counseling: I discussed the risks and benefits of chemotherapy including the risks of nausea/ vomiting, risk of infection from low WBC count, fatigue due to chemo or anemia, bruising or bleeding due to low platelets, mouth sores, loss/ change in taste and decreased appetite. Liver and kidney function will be monitored through out chemotherapy as abnormalities in liver and kidney function may be a side effect of treatment. Cardiac dysfunction due to  Herceptin and Perjeta were discussed in detail. Risk of permanent bone marrow dysfunction and leukemia due to chemo were also discussed.  Plan: Echocardiogram, chemo class [place] has been placed in the hospital Return to clinic in 1 to 2 weeks to start chemo   I discussed the assessment and treatment plan with the patient. The patient was provided an opportunity to ask questions and all were answered. The patient agreed with the plan and demonstrated an understanding of the instructions. The patient was advised to call back or seek an in-person evaluation if the symptoms worsen or if the condition fails to improve as anticipated.   Total time spent: 30 mins including non-face to face time and time spent for planning, charting and coordination of care  Rulon Eisenmenger, MD 04/27/2020    I, Cloyde Reams Dorshimer, am acting as scribe for Nicholas Lose, MD.  I have reviewed the above documentation for accuracy and completeness, and I agree with the above.

## 2020-04-26 NOTE — TOC Transition Note (Signed)
Transition of Care [facility]) - CM/SW Discharge Note   Patient Details  Name: LULABELLE DESTA MRN: [id number] Date of Birth: Dec 25, 1940  [facility] CM/SW Contact:  Carles Collet, RN Phone Number: 04/26/2020, 1:23 PM   Clinical Narrative:    Damaris Schooner w patient over the phone to offer home health services. She declined    Final next level of care: Home/Self Care Barriers to Discharge: No Barriers Identified   Patient Goals and CMS Choice Patient states their goals for this hospitalization and ongoing recovery are:: Feel better CMS Medicare.gov Compare Post Acute Care list provided to:: Patient Choice offered to / list presented to : [place]  Discharge Placement                       Discharge Plan and Services In-house Referral: Clinical Social Work                        HH Arranged: Patient Refused [facility]          Social Determinants of Health (SDOH) Interventions     Readmission Risk Interventions No flowsheet data found.

## 2020-04-26 NOTE — Progress Notes (Signed)
Elizabeth Chase to be D/C'd Home per MD order.  Discussed with the patient and all questions fully answered.  VSS, Skin clean, dry and intact without evidence of skin break down, no evidence of skin tears noted. IV catheter discontinued intact. Site without signs and symptoms of complications. Dressing and pressure applied.  An After Visit Summary was printed and given to the patient. Patient received prescription.  D/c education completed with patient/family including follow up instructions, medication list, d/c activities limitations if indicated, with other d/c instructions as indicated by MD - patient able to verbalize understanding, all questions fully answered.   Patient instructed to return to ED, call 911, or call MD for any changes in condition.   Patient escorted via [place], and D/C home via private auto.  [name] 04/26/2020 3:40 PM

## 2020-04-26 NOTE — Discharge Instructions (Signed)
Follow with Primary MD  in 7 days   Get CBC, CMP,  checked  by Primary MD next visit.    Activity: As tolerated with Full fall precautions use walker/cane & assistance as needed   Disposition Home    Diet: Regular diet   On your next visit with your primary care physician please Get Medicines reviewed and adjusted.   Please request your Prim.MD to go over all Hospital Tests and Procedure/Radiological results at the follow up, please get all Hospital records sent to your Prim MD by signing hospital release before you go home.   If you experience worsening of your admission symptoms, develop shortness of breath, life threatening emergency, suicidal or homicidal thoughts you must seek medical attention immediately by calling 911 or calling your MD immediately  if symptoms less severe.  You Must read complete instructions/literature along with all the possible adverse reactions/side effects for all the Medicines you take and that have been prescribed to you. Take any new Medicines after you have completely understood and accpet all the possible adverse reactions/side effects.   Do not drive, operating heavy machinery, perform activities at heights, swimming or participation in water activities or provide baby sitting services if your were admitted for syncope or siezures until you have seen by Primary MD or a Neurologist and advised to do so again.  Do not drive when taking Pain medications.    Do not take more than prescribed Pain, Sleep and Anxiety Medications  Special Instructions: If you have smoked or chewed Tobacco  in the last 2 yrs please stop smoking, stop any regular Alcohol  and or any Recreational drug use.  Wear Seat belts while driving.   Please note  You were cared for by a hospitalist during your hospital stay. If you have any questions about your discharge medications or the care you received while you were in the hospital after you are discharged, you can call the  unit and asked to speak with the hospitalist on call if the hospitalist that took care of you is not available. Once you are discharged, your primary care physician will handle any further medical issues. Please note that NO REFILLS for any discharge medications will be authorized once you are discharged, as it is imperative that you return to your primary care physician (or establish a relationship with a primary care physician if you do not have one) for your aftercare needs so that they can reassess your need for medications and monitor your lab values.  

## 2020-04-27 ENCOUNTER — Inpatient Hospital Stay: Payer: Medicare HMO | Attending: Hematology and Oncology | Admitting: Hematology and Oncology

## 2020-04-27 DIAGNOSIS — Z170 Estrogen receptor positive status [ER+]: Secondary | ICD-10-CM | POA: Diagnosis not present

## 2020-04-27 DIAGNOSIS — C50911 Malignant neoplasm of unspecified site of right female breast: Secondary | ICD-10-CM | POA: Diagnosis not present

## 2020-04-27 MED ORDER — PROCHLORPERAZINE MALEATE 10 MG PO TABS: 10.0000 mg | ORAL_TABLET | Freq: Four times a day (QID) | ORAL | 1 refills | Status: DC | PRN

## 2020-04-27 MED ORDER — ONDANSETRON HCL 8 MG PO TABS: 8.0000 mg | ORAL_TABLET | Freq: Two times a day (BID) | ORAL | 1 refills | Status: DC | PRN

## 2020-04-27 MED ORDER — LIDOCAINE-PRILOCAINE 2.5-2.5 % EX CREA: TOPICAL_CREAM | CUTANEOUS | 3 refills | Status: DC

## 2020-04-27 NOTE — Progress Notes (Signed)
START OFF PATHWAY REGIMEN - Breast   [id number]:THP (Docetaxel + Pertuzumab/Trastuzumab SUBQ) q21 Days:   Cycle 1: A cycle is 21 days:     Pertuzumab-Trastuzumab-hyaluronidase-zzxf      Docetaxel    Cycles 2 and beyond: A cycle is every 21 days:     Pertuzumab-Trastuzumab-hyaluronidase-zzxf      Docetaxel   **Always confirm dose/schedule in your pharmacy ordering system**  Patient Characteristics: Preoperative or Nonsurgical Candidate (Clinical Staging), Neoadjuvant Therapy followed by Surgery, Invasive Disease, Chemotherapy, HER2 Positive, ER Positive Therapeutic Status: Preoperative or Nonsurgical Candidate (Clinical Staging) AJCC M Category: cM0 AJCC Grade: G3 Breast Surgical Plan: Neoadjuvant Therapy followed by Surgery ER Status: Positive (+) AJCC 8 Stage Grouping: IIIB HER2 Status: Positive (+) AJCC T Category: cT4 AJCC N Category: cN0 PR Status: Positive (+) Intent of Therapy: Curative Intent, Discussed with Patient

## 2020-04-28 ENCOUNTER — Telehealth: Payer: Self-pay | Admitting: Hematology and Oncology

## 2020-04-28 NOTE — Telephone Encounter (Signed)
Scheduled per 3/16 los. Called and spoke with pt daughter [name] [phone] confirmed 3/24, 4/1, and 4/4 appts

## 2020-05-02 ENCOUNTER — Telehealth: Payer: Self-pay | Admitting: *Deleted

## 2020-05-02 ENCOUNTER — Encounter: Payer: Self-pay | Admitting: *Deleted

## 2020-05-02 NOTE — Telephone Encounter (Signed)
Left vm regarding navigation resources and contact information with questions or concerns.

## 2020-05-05 ENCOUNTER — Inpatient Hospital Stay: Payer: Medicare HMO

## 2020-05-06 NOTE — Progress Notes (Signed)
The following biosimilar Kanjinti (trastuzumab-anns) and Ellen Henri have been selected for use in this patient per insurance.  Henreitta Leber, PharmD 05/06/20 @ [phone]

## 2020-05-09 NOTE — Progress Notes (Signed)
Pharmacist Chemotherapy Monitoring - Initial Assessment    Anticipated start date: 05/13/20   Regimen:  . Are orders appropriate based on the patient's diagnosis, regimen, and cycle? Yes . Does the plan date match the patient's scheduled date? Yes . Is the sequencing of drugs appropriate? Yes . Are the premedications appropriate for the patient's regimen? Yes . Prior Authorization for treatment is: Pending o If applicable, is the correct biosimilar selected based on the patient's insurance? yes  Organ Function and Labs: Marland Kitchen Are dose adjustments needed based on the patient's renal function, hepatic function, or hematologic function? No . Are appropriate labs ordered prior to the start of patient's treatment? Yes . Other organ system assessment, if indicated: trastuzumab: Echo/ MUGA and pertuzumab: Echo/ MUGA . The following baseline labs, if indicated, have been ordered: N/A  Dose Assessment: . Are the drug doses appropriate? Yes . Are the following correct: o Drug concentrations Yes o IV fluid compatible with drug Yes o Administration routes Yes o Timing of therapy Yes . If applicable, does the patient have documented access for treatment and/or plans for port-a-cath placement? yes . If applicable, have lifetime cumulative doses been properly documented and assessed? yes Lifetime Dose Tracking  No doses have been documented on this patient for the following tracked chemicals: Doxorubicin, Epirubicin, Idarubicin, Daunorubicin, Mitoxantrone, Bleomycin, Oxaliplatin, Carboplatin, Liposomal Doxorubicin  o   Toxicity Monitoring/Prevention: . The patient has the following take home antiemetics prescribed: Ondansetron and Prochlorperazine . The patient has the following take home medications prescribed: N/A . Medication allergies and previous infusion related reactions, if applicable, have been reviewed and addressed. Yes . The patient's current medication list has been assessed for drug-drug  interactions with their chemotherapy regimen. no significant drug-drug interactions were identified on review.  Order Review: . Are the treatment plan orders signed? Yes . Is the patient scheduled to see a provider prior to their treatment? Yes  I verify that I have reviewed each item in the above checklist and answered each question accordingly.   Kennith Center, Pharm.D., CPP 05/09/2020@4 :20 PM

## 2020-05-11 NOTE — Progress Notes (Signed)
[facility] OFFICE PROGRESS NOTE  No primary care provider on file. No primary provider on file.  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 CT showed a right breast mass, 8.1cm, and right axillary adenopathy. Patient reported a right breast mass present for past 2-3 years. Biopsy showed IDC, grade 3, HER-2 equivocal by IHC, positive by FISH (ratio 3.56), ER+ 95%, PR+ 70%, ,Ki67 25%.    04/18/2020 Cancer Staging   Staging form: Breast, AJCC 8th Edition - Clinical stage from 04/18/2020: Stage IIIB (cT4b, cN1, cM0, G3, ER+, PR+, HER2+) - Signed by Gardenia Phlegm, NP on 04/27/2020 Stage prefix: Initial diagnosis Histologic grading system: 3 grade system   05/13/2020 -  Chemotherapy    Patient is on Treatment Plan: BREAST DOCETAXEL + TRASTUZUMAB + PERTUZUMAB (THP) Q21D        CURRENT THERAPY: Neoadj chemo with Taxotere Herceptin Perjeta. She is here today for cycle #1.   INTERVAL HISTORY: BAMBIE PIZZOLATO 80 y.o. female returns to the clinic today for a follow up visit accompanied by her daughter. The patient is feeling fine today without any concerning complaints except for they have having some social concerns. They have been living with friends and do not have a steady living situation. The patient is living with her daughter who is taking care of her and providing her wound care for her fungating breast mass. She is in need of supplies and cannot afford it. She also cannot afford gas to be going to outpatient appointments for wound care. They have some transportation constraints but do not want to utilize the transportation service here because apparently the patient's daughter cannot come with her. They  also do not want to use the bus system. She is here today for her first cycle of chemotherapy. She had her chemoeducation class and she does not have any questions. Today, she denies any fevers, chills, night sweats, or weight loss. Denies any nausea, vomiting, diarrhea, or constipation. Denies chest pain, shortness of breath, cough, or hemoptysis. She is here for evaluation and repeat blood work before she starts her first cycle of treatment.    MEDICAL HISTORY: Past Medical History:  Diagnosis Date  . Hypertension   . Stroke [facility])     ALLERGIES:  is allergic to bee venom.  MEDICATIONS:  Current Outpatient Medications  Medication Sig Dispense Refill  . atorvastatin (LIPITOR) 20 MG tablet Take 1 tablet (20 mg total) by mouth daily at 6 PM. 30 tablet 0  . cetirizine (ZYRTEC) 10 MG tablet Take 10 mg daily by mouth.    . cyanocobalamin 1000 MCG tablet Take 1,000 mcg by mouth daily.    . ergocalciferol (VITAMIN D2) 1.25 MG (50000 UT) capsule Take 50,000 Units by mouth once a week.    . fluticasone furoate-vilanterol (BREO ELLIPTA) 200-25 MCG/INH AEPB Inhale 1 puff into the lungs daily.    Marland Kitchen lidocaine-prilocaine (EMLA) cream Apply to affected area once 30 g 3  . ondansetron (ZOFRAN) 8 MG tablet Take 1 tablet (8 mg total) by mouth 2 (two) times daily as needed for refractory nausea / vomiting. 30 tablet 1  . prochlorperazine (COMPAZINE) 10 MG tablet Take 1 tablet (10 mg total) by mouth every 6 (six) hours  as needed (Nausea or vomiting). 30 tablet 1  . trazodone (DESYREL) 300 MG tablet Take 300 mg by mouth at bedtime.     No current facility-administered medications for this visit.   Facility-Administered Medications Ordered in Other Visits  Medication Dose Route Frequency Provider Last Rate Last Admin  . DOCEtaxel (TAXOTERE) 80 mg in sodium chloride 0.9 % 250 mL chemo infusion  50 mg/m2 (Treatment Plan Recorded) Intravenous Once Nicholas Lose, MD      . heparin lock flush 100 unit/mL  500  Units Intracatheter Once PRN Nicholas Lose, MD      . pertuzumab (PERJETA) 420 mg in sodium chloride 0.9 % 250 mL chemo infusion  420 mg Intravenous Once Nicholas Lose, MD      . sodium chloride flush (NS) 0.9 % injection 10 mL  10 mL Intracatheter PRN Nicholas Lose, MD      . Theotis [facility]) 450 mg in sodium chloride 0.9 % 250 mL chemo infusion  450 mg Intravenous Once Nicholas Lose, MD        SURGICAL HISTORY:  Past Surgical History:  Procedure Laterality Date  . IR IMAGING GUIDED PORT INSERTION  04/26/2020  . IR US GUIDE VASC ACCESS RIGHT  04/26/2020    REVIEW OF SYSTEMS:   Review of Systems  Constitutional: Negative for appetite change, chills, fatigue, fever and unexpected weight change.  HENT: Negative for mouth sores, nosebleeds, sore throat and trouble swallowing.   Eyes: Negative for eye problems and icterus.  Respiratory: Negative for cough, hemoptysis, shortness of breath and wheezing.   Cardiovascular: Negative for chest pain and leg swelling.  Gastrointestinal: Negative for abdominal pain, constipation, diarrhea, nausea and vomiting.  Genitourinary: Negative for bladder incontinence, difficulty urinating, dysuria, frequency and hematuria.   Musculoskeletal: Negative for back pain, gait problem, neck pain and neck stiffness.  Skin: Positive for breast wound/fungating breast mass. Negative for itching and rash.  Neurological: Negative for dizziness, extremity weakness, gait problem, headaches, light-headedness and seizures.  Hematological: Negative for adenopathy.  Psychiatric/Behavioral: Negative for confusion, depression and sleep disturbance. The patient is not nervous/anxious.     PHYSICAL EXAMINATION:  Blood pressure (!) 164/78, pulse 83, temperature 97.8 F (36.6 C), temperature source Tympanic, resp. rate 14, height $RemoveBe'5\' 5"'JUITwWdbW$  (1.651 m), weight 124 lb 4.8 oz (56.4 kg), SpO2 94 %.  ECOG PERFORMANCE STATUS: 1 - Symptomatic but completely ambulatory  Physical  Exam  Constitutional: Oriented to person, place, and time and well-developed, well-nourished, and in no distress.   HENT:  Head: Normocephalic and atraumatic.  Mouth/Throat: Oropharynx is clear and moist. No oropharyngeal exudate.  Eyes: Conjunctivae are normal. Right eye exhibits no discharge. Left eye exhibits no discharge. No scleral icterus.  Neck: Normal range of motion. Neck supple.  Cardiovascular: Normal rate, regular rhythm, normal heart sounds and intact distal pulses.   Pulmonary/Chest: Effort normal and breath sounds normal. No respiratory distress. No wheezes. No rales.  Abdominal: Soft. Bowel sounds are normal. Exhibits no distension and no mass. There is no tenderness.  Musculoskeletal: Normal range of motion. Exhibits no edema.  Lymphadenopathy:    No cervical adenopathy.  Neurological: Alert and oriented to person, place, and time. Exhibits normal muscle tone. Gait normal. Coordination normal.  Skin: Skin is warm and dry. No rash noted. Not diaphoretic. No erythema. No pallor.  Psychiatric: Mood, memory and judgment normal.  Breast: Fungating breast mass, right breast. See below     LABORATORY DATA: Lab Results  Component Value Date   WBC 6.6  05/13/2020   HGB 11.3 (L) 05/13/2020   HCT 34.9 (L) 05/13/2020   MCV 89.0 05/13/2020   PLT 292 05/13/2020      Chemistry      Component Value Date/Time   NA 142 05/13/2020 0914   K 4.3 05/13/2020 0914   CL 107 05/13/2020 0914   CO2 24 05/13/2020 0914   BUN 12 05/13/2020 0914   CREATININE 0.79 05/13/2020 0914      Component Value Date/Time   CALCIUM 8.7 (L) 05/13/2020 0914   ALKPHOS 68 05/13/2020 0914   AST 15 05/13/2020 0914   ALT 11 05/13/2020 0914   BILITOT 0.4 05/13/2020 0914       RADIOGRAPHIC STUDIES:  DG Wrist Complete Left  Result Date: 04/16/2020 CLINICAL DATA:  Left wrist swelling. EXAM: LEFT WRIST - COMPLETE 3+ VIEW COMPARISON:  None. FINDINGS: Degenerative changes between the base of the first  metacarpal and trapezium. Degenerative changes between the trapezium and scaphoid. Soft tissue swelling best seen on the lateral view. No fractures or bony erosions identified. IMPRESSION: Soft tissue swelling.  Degenerative changes as above. Electronically Signed   By: Gerome Sam III M.D   On: 04/16/2020 12:33   CT Head Wo Contrast  Result Date: 04/15/2020 CLINICAL DATA:  Syncope with normal neuro exam EXAM: CT HEAD WITHOUT CONTRAST TECHNIQUE: Contiguous axial images were obtained from the base of the skull through the vertex without intravenous contrast. COMPARISON:  Brain MRI 01/02/2017 FINDINGS: Brain: No evidence of acute infarction, hemorrhage, hydrocephalus, extra-axial collection or mass effect. 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 Stable calcification in the right posterior frontal white matter age congruent cerebral volume loss. Known meningioma at the right vertex again measuring 9 mm. Vascular: No hyperdense vessel or unexpected calcification. Skull: Normal. Negative for fracture or focal lesion. Sinuses/Orbits: No acute finding. IMPRESSION: No acute finding or change from 2018 Electronically Signed   By: Marnee Spring M.D.   On: 04/15/2020 08:42   CT Angio Chest PE W and/or Wo Contrast  Result Date: 04/15/2020 CLINICAL DATA:  Abdominal pain, nonlocalized, weakness, fatigue, chronic RIGHT breast infection for years drainage EXAM: CT ANGIOGRAPHY CHEST CT ABDOMEN AND PELVIS WITH CONTRAST TECHNIQUE: Multidetector CT imaging of the chest was performed using the standard protocol during bolus administration of intravenous contrast. Multiplanar CT image reconstructions and MIPs were obtained to evaluate the vascular anatomy. Multidetector CT imaging of the abdomen and pelvis was performed using the standard protocol during bolus administration of intravenous contrast. CONTRAST:  OMNIPAQUE IOHEXOL 350 MG/ML SOLN IV. No oral contrast. COMPARISON:   None FINDINGS: CTA CHEST FINDINGS Cardiovascular: Atherosclerotic calcifications aorta, coronary arteries, and proximal great vessels. Aorta normal caliber without aneurysm or dissection. Heart unremarkable. No pericardial effusion. Pulmonary arteries adequately opacified and patent. No evidence of pulmonary embolism. Mediastinum/Nodes: Base of cervical region normal appearance. Esophagus unremarkable. Large lobulated soft tissue mass identified at RIGHT breast extending through skin surface, 8.1 x 4.9 x 7.1 cm in size most consistent with malignancy rather than infection. Mass extends to the chest wall with loss of fat plane between the mass and the underlying pectoralis muscle. Additional small nodule within RIGHT breast 14 x 7 mm question second mass versus lymph node. RIGHT axillary adenopathy with nodes measuring up to 18 mm short axis. Normal sized mediastinal lymph nodes. Lungs/Pleura: Emphysematous changes. Dependent atelectasis. Lungs otherwise clear. No pulmonary infiltrate, pleural effusion, or pneumothorax. No pulmonary mass/nodule. Musculoskeletal: No acute osseous findings. Review of the MIP  images confirms the above findings. CT ABDOMEN and PELVIS FINDINGS Hepatobiliary: Gallbladder and liver normal appearance Pancreas: Atrophic without focal mass Spleen: Normal appearance Adrenals/Urinary Tract: Cortical scarring at inferior pole LEFT kidney. Adrenal glands, kidneys, ureters, and bladder otherwise normal appearance Stomach/Bowel: Appendix not visualized, no pericecal inflammatory process seen. Stomach decompressed. Bowel wall thickening of descending colon with mild surrounding infiltrative changes over a long length favoring descending colitis. Minimal thickening of LEFT lateral conal fascia. Sigmoid diverticulosis without evidence of diverticulitis. Remaining bowel loops unremarkable. Vascular/Lymphatic: Atherosclerotic calcifications aorta, iliac arteries, visceral arteries. Infrarenal abdominal  aortic aneurysm 4.2 x 4.0 cm in greatest axial dimensions extending 6.6 cm length, terminating above bifurcation. Moderate thrombus. No perianeurysmal infiltration/hemorrhage. No adenopathy. Reproductive: Atrophic uterus with unremarkable adnexa Other: No free air or free fluid.  No hernia. Musculoskeletal: Osseous demineralization. Review of the MIP images confirms the above findings. IMPRESSION: No evidence of pulmonary embolism. Large RIGHT breast mass 8.1 cm greatest size most consistent with malignancy with associated RIGHT axillary adenopathy. Additional small RIGHT breast nodule 14 x 7 mm question mass versus intramammary node. COPD. Descending colitis. Sigmoid diverticulosis without evidence of diverticulitis. Infrarenal abdominal aortic aneurysm 4.2 cm in greatest axial dimensions; Recommend follow-up every 12 months and vascular consultation. This recommendation follows ACR consensus guidelines: White Paper of the ACR Incidental Findings Committee II on Vascular Findings. J Am Coll Radiol 2013; 10:789-794. Emphysema (ICD10-J43.9). Aortic Atherosclerosis (ICD10-I70.0). Aortic aneurysm NOS (ICD10-I71.9).: Findings called to Dr.  Francia Greaves on 04/15/2020 at 0857 hours. Electronically Signed   By: Lavonia Dana M.D.   On: 04/15/2020 08:58   CT ABDOMEN PELVIS W CONTRAST  Result Date: 04/15/2020 CLINICAL DATA:  Abdominal pain, nonlocalized, weakness, fatigue, chronic RIGHT breast infection for years drainage EXAM: CT ANGIOGRAPHY CHEST CT ABDOMEN AND PELVIS WITH CONTRAST TECHNIQUE: Multidetector CT imaging of the chest was performed using the standard protocol during bolus administration of intravenous contrast. Multiplanar CT image reconstructions and MIPs were obtained to evaluate the vascular anatomy. Multidetector CT imaging of the abdomen and pelvis was performed using the standard protocol during bolus administration of intravenous contrast. CONTRAST:  128mL OMNIPAQUE IOHEXOL 350 MG/ML SOLN IV. No oral  contrast. COMPARISON:  None FINDINGS: CTA CHEST FINDINGS Cardiovascular: Atherosclerotic calcifications aorta, coronary arteries, and proximal great vessels. Aorta normal caliber without aneurysm or dissection. Heart unremarkable. No pericardial effusion. Pulmonary arteries adequately opacified and patent. No evidence of pulmonary embolism. Mediastinum/Nodes: Base of cervical region normal appearance. Esophagus unremarkable. Large lobulated soft tissue mass identified at RIGHT breast extending through skin surface, 8.1 x 4.9 x 7.1 cm in size most consistent with malignancy rather than infection. Mass extends to the chest wall with loss of fat plane between the mass and the underlying pectoralis muscle. Additional small nodule within RIGHT breast 14 x 7 mm question second mass versus lymph node. RIGHT axillary adenopathy with nodes measuring up to 18 mm short axis. Normal sized mediastinal lymph nodes. Lungs/Pleura: Emphysematous changes. Dependent atelectasis. Lungs otherwise clear. No pulmonary infiltrate, pleural effusion, or pneumothorax. No pulmonary mass/nodule. Musculoskeletal: No acute osseous findings. Review of the MIP images confirms the above findings. CT ABDOMEN and PELVIS FINDINGS Hepatobiliary: Gallbladder and liver normal appearance Pancreas: Atrophic without focal mass Spleen: Normal appearance Adrenals/Urinary Tract: Cortical scarring at inferior pole LEFT kidney. Adrenal glands, kidneys, ureters, and bladder otherwise normal appearance Stomach/Bowel: Appendix not visualized, no pericecal inflammatory process seen. Stomach decompressed. Bowel wall thickening of descending colon with mild surrounding infiltrative changes over a long length favoring descending  colitis. Minimal thickening of LEFT lateral conal fascia. Sigmoid diverticulosis without evidence of diverticulitis. Remaining bowel loops unremarkable. Vascular/Lymphatic: Atherosclerotic calcifications aorta, iliac arteries, visceral arteries.  Infrarenal abdominal aortic aneurysm 4.2 x 4.0 cm in greatest axial dimensions extending 6.6 cm length, terminating above bifurcation. Moderate thrombus. No perianeurysmal infiltration/hemorrhage. No adenopathy. Reproductive: Atrophic uterus with unremarkable adnexa Other: No free air or free fluid.  No hernia. Musculoskeletal: Osseous demineralization. Review of the MIP images confirms the above findings. IMPRESSION: No evidence of pulmonary embolism. Large RIGHT breast mass 8.1 cm greatest size most consistent with malignancy with associated RIGHT axillary adenopathy. Additional small RIGHT breast nodule 14 x 7 mm question mass versus intramammary node. COPD. Descending colitis. Sigmoid diverticulosis without evidence of diverticulitis. Infrarenal abdominal aortic aneurysm 4.2 cm in greatest axial dimensions; Recommend follow-up every 12 months and vascular consultation. This recommendation follows ACR consensus guidelines: White Paper of the ACR Incidental Findings Committee II on Vascular Findings. J Am Coll Radiol 2013; 10:789-794. Emphysema (ICD10-J43.9). Aortic Atherosclerosis (ICD10-I70.0). Aortic aneurysm NOS (ICD10-I71.9).: Findings called to Dr.  Francia Greaves on 04/15/2020 at 0857 hours. Electronically Signed   By: Lavonia Dana M.D.   On: 04/15/2020 08:58   IR US Guide Vasc Access Right  Result Date: 04/26/2020 INDICATION: 80 year old female with advanced right-sided breast cancer and axillary nodal metastatic disease. She presents for port catheter placement to establish durable IV access for palliative chemotherapy. EXAM: IMPLANTED PORT A CATH PLACEMENT WITH ULTRASOUND AND FLUOROSCOPIC GUIDANCE MEDICATIONS: None ANESTHESIA/SEDATION: Versed 2 mg IV; Fentanyl 50 mcg IV; Moderate Sedation Time:  16 minutes The patient was continuously monitored during the procedure by the interventional radiology nurse under my direct supervision. FLUOROSCOPY TIME:  0 minutes, 6 seconds (0 mGy) COMPLICATIONS: None immediate.  PROCEDURE: The right neck and chest was prepped with chlorhexidine, and draped in the usual sterile fashion using maximum barrier technique (cap and mask, sterile gown, sterile gloves, large sterile sheet, hand hygiene and cutaneous antiseptic). Local anesthesia was attained by infiltration with 1% lidocaine with epinephrine. Ultrasound demonstrated patency of the right internal jugular vein, and this was documented with an image. Under real-time ultrasound guidance, this vein was accessed with a 21 gauge micropuncture needle and image documentation was performed. A small dermatotomy was made at the access site with an 11 scalpel. A 0.018" wire was advanced into the SVC and the access needle exchanged for a 67F micropuncture vascular sheath. The 0.018" wire was then removed and a 0.035" wire advanced into the IVC. An appropriate location for the subcutaneous reservoir was selected below the clavicle and an incision was made through the skin and underlying soft tissues. The subcutaneous tissues were then dissected using a combination of blunt and sharp surgical technique and a pocket was formed. A single lumen low-profile power injectable portacatheter was then tunneled through the subcutaneous tissues from the pocket to the dermatotomy and the port reservoir placed within the subcutaneous pocket. The venous access site was then serially dilated and a peel away vascular sheath placed over the wire. The wire was removed and the port catheter advanced into position under fluoroscopic guidance. The catheter tip is positioned in the upper right atrium. This was documented with a spot image. The portacatheter was then tested and found to flush and aspirate well. The port was flushed with saline followed by 100 units/mL heparinized saline. The pocket was then closed in two layers using first subdermal inverted interrupted absorbable sutures followed by a running subcuticular suture. The epidermis was then sealed  with  Dermabond. The dermatotomy at the venous access site was also closed with Dermabond. IMPRESSION: Successful placement of a right IJ approach Power Port with ultrasound and fluoroscopic guidance. The catheter is ready for use. Electronically Signed   By: Jacqulynn Cadet M.D.   On: 04/26/2020 12:36   DG Chest Port 1 View  Result Date: 04/15/2020 CLINICAL DATA:  Recent syncopal episode EXAM: PORTABLE CHEST 1 VIEW COMPARISON:  None. FINDINGS: Cardiac shadows within normal limits. Aortic calcifications are noted. Lungs are hyperinflated bilaterally. Rounded density is noted in the right base measuring proximally 6 cm. This may represent fluid within the major fissure although the possibility of underlying mass deserves consideration. CT of the chest with contrast is recommended for further evaluation. IMPRESSION: Rounded density in the right lung base suspicious for underlying mass. CT is recommended for further evaluation. Electronically Signed   By: Inez Catalina M.D.   On: 04/15/2020 07:36   IR IMAGING GUIDED PORT INSERTION  Result Date: 04/26/2020 INDICATION: 80 year old female with advanced right-sided breast cancer and axillary nodal metastatic disease. She presents for port catheter placement to establish durable IV access for palliative chemotherapy. EXAM: IMPLANTED PORT A CATH PLACEMENT WITH ULTRASOUND AND FLUOROSCOPIC GUIDANCE MEDICATIONS: None ANESTHESIA/SEDATION: Versed 2 mg IV; Fentanyl 50 mcg IV; Moderate Sedation Time:  16 minutes The patient was continuously monitored during the procedure by the interventional radiology nurse under my direct supervision. FLUOROSCOPY TIME:  0 minutes, 6 seconds (0 mGy) COMPLICATIONS: None immediate. PROCEDURE: The right neck and chest was prepped with chlorhexidine, and draped in the usual sterile fashion using maximum barrier technique (cap and mask, sterile gown, sterile gloves, large sterile sheet, hand hygiene and cutaneous antiseptic). Local anesthesia was  attained by infiltration with 1% lidocaine with epinephrine. Ultrasound demonstrated patency of the right internal jugular vein, and this was documented with an image. Under real-time ultrasound guidance, this vein was accessed with a 21 gauge micropuncture needle and image documentation was performed. A small dermatotomy was made at the access site with an 11 scalpel. A 0.018" wire was advanced into the SVC and the access needle exchanged for a 93F micropuncture vascular sheath. The 0.018" wire was then removed and a 0.035" wire advanced into the IVC. An appropriate location for the subcutaneous reservoir was selected below the clavicle and an incision was made through the skin and underlying soft tissues. The subcutaneous tissues were then dissected using a combination of blunt and sharp surgical technique and a pocket was formed. A single lumen low-profile power injectable portacatheter was then tunneled through the subcutaneous tissues from the pocket to the dermatotomy and the port reservoir placed within the subcutaneous pocket. The venous access site was then serially dilated and a peel away vascular sheath placed over the wire. The wire was removed and the port catheter advanced into position under fluoroscopic guidance. The catheter tip is positioned in the upper right atrium. This was documented with a spot image. The portacatheter was then tested and found to flush and aspirate well. The port was flushed with saline followed by 100 units/mL heparinized saline. The pocket was then closed in two layers using first subdermal inverted interrupted absorbable sutures followed by a running subcuticular suture. The epidermis was then sealed with Dermabond. The dermatotomy at the venous access site was also closed with Dermabond. IMPRESSION: Successful placement of a right IJ approach Power Port with ultrasound and fluoroscopic guidance. The catheter is ready for use. Electronically Signed   By: Jacqulynn Cadet  M.D.   On: 04/26/2020 12:36   VAS [place] LOWER EXTREMITY VENOUS (DVT)  Result Date: 04/16/2020  Lower Venous DVT Study Indications: Rapidly rising D-dimer & Covid+.  Comparison Study: No previous exams Performing Technologist: Rogelia Rohrer  Examination Guidelines: A complete evaluation includes B-mode imaging, spectral Doppler, color Doppler, and power Doppler as needed of all accessible portions of each vessel. Bilateral testing is considered an integral part of a complete examination. Limited examinations for reoccurring indications may be performed as noted. The reflux portion of the exam is performed with the patient in reverse Trendelenburg.  +---------+---------------+---------+-----------+----------+--------------+ RIGHT    CompressibilityPhasicitySpontaneityPropertiesThrombus Aging +---------+---------------+---------+-----------+----------+--------------+ CFV      Full           Yes      Yes                                 +---------+---------------+---------+-----------+----------+--------------+ SFJ      Full                                                        +---------+---------------+---------+-----------+----------+--------------+ FV Prox  Full           Yes      Yes                                 +---------+---------------+---------+-----------+----------+--------------+ FV Mid   Full           Yes      Yes                                 +---------+---------------+---------+-----------+----------+--------------+ FV DistalFull           Yes      Yes                                 +---------+---------------+---------+-----------+----------+--------------+ PFV      Full                                                        +---------+---------------+---------+-----------+----------+--------------+ POP      Full           Yes      Yes                                 +---------+---------------+---------+-----------+----------+--------------+ PTV       Full                                                        +---------+---------------+---------+-----------+----------+--------------+ PERO     Full                                                        +---------+---------------+---------+-----------+----------+--------------+   +---------+---------------+---------+-----------+----------+--------------+  LEFT     CompressibilityPhasicitySpontaneityPropertiesThrombus Aging +---------+---------------+---------+-----------+----------+--------------+ CFV      Full                                                        +---------+---------------+---------+-----------+----------+--------------+ SFJ      Full                                                        +---------+---------------+---------+-----------+----------+--------------+ FV Prox  Full           Yes      Yes                                 +---------+---------------+---------+-----------+----------+--------------+ FV Mid   Full           Yes      Yes                                 +---------+---------------+---------+-----------+----------+--------------+ FV DistalFull           Yes      Yes                                 +---------+---------------+---------+-----------+----------+--------------+ PFV      Full                                                        +---------+---------------+---------+-----------+----------+--------------+ POP      Full           Yes      Yes                                 +---------+---------------+---------+-----------+----------+--------------+ PTV      Full                                                        +---------+---------------+---------+-----------+----------+--------------+ PERO     Full                                                        +---------+---------------+---------+-----------+----------+--------------+     Summary: BILATERAL: - No evidence of deep vein  thrombosis seen in the lower extremities, bilaterally. - No evidence of superficial venous thrombosis in the lower extremities, bilaterally. -No evidence of popliteal cyst, bilaterally.   *See table(s) above for measurements and observations. Electronically signed by Jamelle Haring on 04/16/2020 at 5:48:36 PM.  Final    ECHOCARDIOGRAM LIMITED  Result Date: 04/23/2020    ECHOCARDIOGRAM LIMITED REPORT   Patient Name:   MAIDIE STREIGHT Date of Exam: 04/23/2020 Medical Rec #:  [id number]     Height:       65.0 in Accession #:    [id number]    Weight:       121.0 lb Date of Birth:  07-May-1940     BSA:          1.598 m Patient Age:    65 years      BP:           123/78 mmHg Patient Gender: F             HR:           68 bpm. Exam Location:  Inpatient Procedure: Limited Echo, Limited Color Doppler, Strain Analysis and Cardiac            Doppler Indications:    breast cancer  History:        Patient has prior history of Echocardiogram examinations, most                 recent 01/02/2017. Covid; Risk Factors:Hypertension and                 Dyslipidemia.  Sonographer:    Johny Chess Referring Phys: [address] [place]  1. Focused echo perfomed in setting of COVID + status.  2. Left ventricular ejection fraction, by estimation, is 55%. The left ventricle has normal function. Left ventricular diastolic parameters are consistent with Grade I diastolic dysfunction (impaired relaxation).  3. Right ventricular systolic function is normal. The right ventricular size is normal.  4. Aortic valve regurgitation is trivial.  5. The inferior vena cava is normal in size with greater than 50% respiratory variability, suggesting right atrial pressure of 3 mmHg. FINDINGS  Left Ventricle: Left ventricular ejection fraction, by estimation, is 55%. The left ventricle has normal function. Global longitudinal strain performed but not reported based on interpreter judgement due to suboptimal tracking. Left ventricular  diastolic parameters are consistent with Grade I diastolic dysfunction (impaired relaxation). Right Ventricle: The right ventricular size is normal. No increase in right ventricular wall thickness. Right ventricular systolic function is normal. Pericardium: Trivial pericardial effusion is present. Aortic Valve: Aortic valve regurgitation is trivial. Venous: The inferior vena cava is normal in size with greater than 50% respiratory variability, suggesting right atrial pressure of 3 mmHg.  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 She was diagnosed in March 2022.   The plan is to undergo neoadjuvant chemotherapy with Taxotere, herceptin, and perjeta. This will be followed  by mastectomy with sentinel node biopsy. This will follow with adjuvant XRT and then anti-estrogen therapy.   The patient is here today for her first cycle of neoadjuvant chemotherapy. Labs were reviewed with the patient. Her labs are adequate to proceed with cycle #1 today as scheduled.   I reviewed the breast mass/wound with Dr. Lindi Adie who noted she is ok to proceed with treatment today. He did not recommend anti-biotics at this time. The patient does not have leukocytosis. No systemic symptoms such as fevers, chills, or night sweats. However, we will arrange for a 1 week follow up visit next week with him to manage any adverse side effects of  treatment and to closely monitor her breast. The patient was given supplies for wound care. We changed her dressing while in the clinic today. I have referred the patient to home health for wound care. Per the patient's daughter, they are unable to go to outpatient wound care appointments due to transportation constraints and the patient needing assistance to get to appointments. I also wrote out a prescription for the wound care supplies to see if they can be cheaper with a prescription, per patient daughter request.   I also have referred the patient to social work due to her living situation and transportation concerns to see if their are any assistance programs available that she may benefit from.   I have also referred her to nutrition per patient daughter request.   We will see her back for a follow up visit in 1 weeks for evaluation before starting cycle #2.   The patient was advised to call immediately if she has any concerning symptoms in the interval. The patient voices understanding of current disease status and treatment options and is in agreement with the current care plan. All questions were answered. The patient knows to call the clinic with any problems, questions or concerns. We can certainly see the patient much sooner if necessary   Orders Placed This Encounter  Procedures  . Ambulatory Referral to [facility]    Referral Priority:   Routine    Referral Type:   Consultation    Referral Reason:   Specialty Services Required    Number of Visits Requested:   1  . Ambulatory referral to Home Health    Referral Priority:   Routine    Referral Type:   Home Health Care    Referral Reason:   Specialty Services Required    Requested Specialty:   [place]    Number of Visits Requested:   1  . Ambulatory referral to Social Work    Referral Priority:   Routine    Referral Type:   Consultation    Referral Reason:   Specialty Services Required    Number of Visits  Requested:   1     I spent 30-39 minutes in this encounter.   Bich Mchaney L Kortlynn Poust, PA-C 05/13/20

## 2020-05-12 ENCOUNTER — Other Ambulatory Visit: Payer: Self-pay

## 2020-05-12 ENCOUNTER — Inpatient Hospital Stay: Payer: Medicare HMO

## 2020-05-12 ENCOUNTER — Other Ambulatory Visit: Payer: Self-pay | Admitting: *Deleted

## 2020-05-12 DIAGNOSIS — C50911 Malignant neoplasm of unspecified site of right female breast: Secondary | ICD-10-CM

## 2020-05-12 MED ORDER — LIDOCAINE-PRILOCAINE 2.5-2.5 % EX CREA: TOPICAL_CREAM | CUTANEOUS | 3 refills | Status: DC

## 2020-05-12 MED ORDER — PROCHLORPERAZINE MALEATE 10 MG PO TABS: 10.0000 mg | ORAL_TABLET | Freq: Four times a day (QID) | ORAL | 1 refills | Status: DC | PRN

## 2020-05-12 MED ORDER — ONDANSETRON HCL 8 MG PO TABS: 8.0000 mg | ORAL_TABLET | Freq: Two times a day (BID) | ORAL | 1 refills | Status: DC | PRN

## 2020-05-13 ENCOUNTER — Encounter: Payer: Self-pay | Admitting: Physician Assistant

## 2020-05-13 ENCOUNTER — Inpatient Hospital Stay (HOSPITAL_BASED_OUTPATIENT_CLINIC_OR_DEPARTMENT_OTHER): Payer: Medicare HMO | Admitting: Physician Assistant

## 2020-05-13 ENCOUNTER — Inpatient Hospital Stay: Payer: Medicare HMO

## 2020-05-13 ENCOUNTER — Encounter: Payer: Self-pay | Admitting: Licensed Clinical Social Worker

## 2020-05-13 ENCOUNTER — Encounter: Payer: Self-pay | Admitting: *Deleted

## 2020-05-13 ENCOUNTER — Telehealth: Payer: Self-pay | Admitting: Physician Assistant

## 2020-05-13 ENCOUNTER — Other Ambulatory Visit: Payer: Self-pay

## 2020-05-13 ENCOUNTER — Inpatient Hospital Stay: Payer: Medicare HMO | Attending: Hematology and Oncology

## 2020-05-13 VITALS — BP 164/78 | HR 83 | Temp 97.8°F | Resp 14 | Ht 65.0 in | Wt 124.3 lb

## 2020-05-13 VITALS — BP 166/72 | HR 70 | Temp 97.7°F | Resp 17

## 2020-05-13 DIAGNOSIS — Z170 Estrogen receptor positive status [ER+]: Secondary | ICD-10-CM | POA: Diagnosis not present

## 2020-05-13 DIAGNOSIS — Z95828 Presence of other vascular implants and grafts: Secondary | ICD-10-CM

## 2020-05-13 DIAGNOSIS — C50911 Malignant neoplasm of unspecified site of right female breast: Secondary | ICD-10-CM

## 2020-05-13 DIAGNOSIS — S21001D Unspecified open wound of right breast, subsequent encounter: Secondary | ICD-10-CM

## 2020-05-13 DIAGNOSIS — Z5189 Encounter for other specified aftercare: Secondary | ICD-10-CM | POA: Diagnosis not present

## 2020-05-13 DIAGNOSIS — Z5900 Homelessness unspecified: Secondary | ICD-10-CM

## 2020-05-13 DIAGNOSIS — Z5112 Encounter for antineoplastic immunotherapy: Secondary | ICD-10-CM | POA: Insufficient documentation

## 2020-05-13 DIAGNOSIS — S21009A Unspecified open wound of unspecified breast, initial encounter: Secondary | ICD-10-CM | POA: Insufficient documentation

## 2020-05-13 DIAGNOSIS — Z5111 Encounter for antineoplastic chemotherapy: Secondary | ICD-10-CM | POA: Diagnosis present

## 2020-05-13 HISTORY — DX: Unspecified open wound of unspecified breast, initial encounter: S21.009A

## 2020-05-13 HISTORY — DX: Presence of other vascular implants and grafts: Z95.828

## 2020-05-13 HISTORY — DX: Homelessness unspecified: Z59.00

## 2020-05-13 LAB — CBC WITH DIFFERENTIAL (CANCER CENTER ONLY)
Abs Immature Granulocytes: 0.01 K/uL (ref 0.00–0.07)
Basophils Absolute: 0 K/uL (ref 0.0–0.1)
Basophils Relative: 1 %
Eosinophils Absolute: 0.2 K/uL (ref 0.0–0.5)
Eosinophils Relative: 3 %
HCT: 34.9 % — ABNORMAL LOW (ref 36.0–46.0)
Hemoglobin: 11.3 g/dL — ABNORMAL LOW (ref 12.0–15.0)
Immature Granulocytes: 0 %
Lymphocytes Relative: 17 %
Lymphs Abs: 1.1 K/uL (ref 0.7–4.0)
MCH: 28.8 pg (ref 26.0–34.0)
MCHC: 32.4 g/dL (ref 30.0–36.0)
MCV: 89 fL (ref 80.0–100.0)
Monocytes Absolute: 0.5 K/uL (ref 0.1–1.0)
Monocytes Relative: 7 %
Neutro Abs: 4.7 K/uL (ref 1.7–7.7)
Neutrophils Relative %: 72 %
Platelet Count: 292 K/uL (ref 150–400)
RBC: 3.92 MIL/uL (ref 3.87–5.11)
RDW: 13.7 % (ref 11.5–15.5)
WBC Count: 6.6 K/uL (ref 4.0–10.5)
nRBC: 0 % (ref 0.0–0.2)

## 2020-05-13 LAB — CMP (CANCER CENTER ONLY)
ALT: 11 U/L (ref 0–44)
AST: 15 U/L (ref 15–41)
Albumin: 3.6 g/dL (ref 3.5–5.0)
Alkaline Phosphatase: 68 U/L (ref 38–126)
Anion gap: 11 (ref 5–15)
BUN: 12 mg/dL (ref 8–23)
CO2: 24 mmol/L (ref 22–32)
Calcium: 8.7 mg/dL — ABNORMAL LOW (ref 8.9–10.3)
Chloride: 107 mmol/L (ref 98–111)
Creatinine: 0.79 mg/dL (ref 0.44–1.00)
GFR, Estimated: >60 mL/min
Glucose, Bld: 109 mg/dL — ABNORMAL HIGH (ref 70–99)
Potassium: 4.3 mmol/L (ref 3.5–5.1)
Sodium: 142 mmol/L (ref 135–145)
Total Bilirubin: 0.4 mg/dL (ref 0.3–1.2)
Total Protein: 6.8 g/dL (ref 6.5–8.1)

## 2020-05-13 MED ORDER — SODIUM CHLORIDE 0.9 % IV SOLN
10.0000 mg | Freq: Once | INTRAVENOUS | Status: AC
  Administered 2020-05-13: 10 mg via INTRAVENOUS
  Filled 2020-05-13: qty 10

## 2020-05-13 MED ORDER — ACETAMINOPHEN 325 MG PO TABS
650.0000 mg | ORAL_TABLET | Freq: Once | ORAL | Status: AC
  Administered 2020-05-13: 650 mg via ORAL

## 2020-05-13 MED ORDER — SODIUM CHLORIDE 0.9 % IV SOLN
420.0000 mg | Freq: Once | INTRAVENOUS | Status: AC
  Administered 2020-05-13: 420 mg via INTRAVENOUS
  Filled 2020-05-13: qty 14

## 2020-05-13 MED ORDER — HEPARIN SOD (PORK) LOCK FLUSH 100 UNIT/ML IV SOLN
500.0000 [IU] | Freq: Once | INTRAVENOUS | Status: AC | PRN
  Administered 2020-05-13: 500 [IU]
  Filled 2020-05-13: qty 5

## 2020-05-13 MED ORDER — DIPHENHYDRAMINE HCL 25 MG PO CAPS
ORAL_CAPSULE | ORAL | Status: AC
  Filled 2020-05-13: qty 1

## 2020-05-13 MED ORDER — TRASTUZUMAB-ANNS CHEMO 150 MG IV SOLR
450.0000 mg | Freq: Once | INTRAVENOUS | Status: AC
  Administered 2020-05-13: 450 mg via INTRAVENOUS
  Filled 2020-05-13: qty 21.43

## 2020-05-13 MED ORDER — ACETAMINOPHEN 325 MG PO TABS
ORAL_TABLET | ORAL | Status: AC
  Filled 2020-05-13: qty 2

## 2020-05-13 MED ORDER — SODIUM CHLORIDE 0.9 % IV SOLN
50.0000 mg/m2 | Freq: Once | INTRAVENOUS | Status: AC
  Administered 2020-05-13: 80 mg via INTRAVENOUS
  Filled 2020-05-13: qty 8

## 2020-05-13 MED ORDER — SODIUM CHLORIDE 0.9% FLUSH
10.0000 mL | Freq: Once | INTRAVENOUS | Status: AC
  Administered 2020-05-13: 10 mL
  Filled 2020-05-13: qty 10

## 2020-05-13 MED ORDER — SODIUM CHLORIDE 0.9% FLUSH
10.0000 mL | INTRAVENOUS | Status: DC | PRN
  Administered 2020-05-13: 10 mL
  Filled 2020-05-13: qty 10

## 2020-05-13 MED ORDER — DIPHENHYDRAMINE HCL 25 MG PO CAPS
25.0000 mg | ORAL_CAPSULE | Freq: Once | ORAL | Status: AC
  Administered 2020-05-13: 25 mg via ORAL

## 2020-05-13 MED ORDER — SODIUM CHLORIDE 0.9 % IV SOLN
Freq: Once | INTRAVENOUS | Status: AC
  Filled 2020-05-13: qty 250

## 2020-05-13 NOTE — Progress Notes (Signed)
[facility] Work  Initial Assessment   Elizabeth Chase is a 80 y.o. year old female seen in infusion. Clinical Social Work was referred by PA for assessment of psychosocial needs.   SDOH (Social Determinants of Health) assessments performed: Yes SDOH Interventions   Flowsheet Row Most Recent Value  SDOH Interventions   Food Insecurity Interventions Other (Comment)  [bags of food given today, info on food pantries]  Financial Strain Interventions Other (Comment)  [bags from food pantry, list of local food pantry and assistance programs, applications for breast cancer foundations]  Housing Interventions Other (Comment)  [Pt already referred to various housing resources from inpt SW. Also working with DSS.]  Transportation Interventions Other (Comment)  [[employer] fund gas cards]      Distress Screen completed: No No flowsheet data found.    Family/Social Information:  . Housing Arrangement: patient lives with daughter, [name].  They are currently staying with daughter's ex-in-law. Trying to find their own place . Family members/support persons in your life? Daughter . Transportation concerns: yes, worried about gas costs.  . Employment: Retired. Income source: [place] (around $1080/month), daughter has job . Financial concerns: Yes, current concerns o Type of concern: Rent/ mortgage, Chief Executive Officer . Food access concerns: yes, due to cost . Religious or spiritual practice: yes, faith is very important to patient and helps her through this process . Services Currently in place:  Will speak with daughter re: current services on Monday  Coping/ Adjustment to diagnosis: . Patient understands treatment plan and what happens next? yes . Concerns about diagnosis and/or treatment: living arrangement and financial concerns . Patient reported stressors: Housing, Publishing rights manager, Chief Executive Officer . Current coping skills/ strengths: Active sense of humor,  Religious Affiliation and Supportive family/friends    SUMMARY: Current SDOH Barriers:  . Housing barriers and Financial concerns  Clinical Social Work Clinical Goal(s):  Marland Kitchen Patient and daughter will continue working with community agencies to address financial and housing needs  . Patient/ daughter will work on applications for assistance through breast cancer foundations  Interventions: . Informed patient of the support team roles and support services at [facility] . Provided CSW contact information and encouraged patient to call with any questions or concerns . Provided patient with information about local food pantries and provided bags of food from [employer]  . Provided information on [employer], [place], and 1-800-Charity-Cars for other assistance . Provided applications for breast cancer foundations that may be able to offer financial assistance   Follow Up Plan: CSW will speak with pt's daughter by phone on Monday to review information given today (unable to speak today). Patient and daughter will follow-up with resources given Patient verbalizes understanding of plan: Yes    Christeen Douglas , LCSW

## 2020-05-13 NOTE — Telephone Encounter (Signed)
Scheduled appt per 4/1 sch msg. Pt's daughter is aware.

## 2020-05-13 NOTE — Patient Instructions (Addendum)
[facility] Discharge Instructions for Patients Receiving Chemotherapy  Today you received the following chemotherapy agents Traztuzumab(Herceptin), Pertuzumab(Perjeta), Docetaxel(Taxotere).  To help prevent nausea and vomiting after your treatment, we encourage you to take your nausea medication as directed.    If you develop nausea and vomiting that is not controlled by your nausea medication, call the clinic.   BELOW ARE SYMPTOMS THAT SHOULD BE REPORTED IMMEDIATELY:  *FEVER GREATER THAN 100.5 F  *CHILLS WITH OR WITHOUT FEVER  NAUSEA AND VOMITING THAT IS NOT CONTROLLED WITH YOUR NAUSEA MEDICATION  *UNUSUAL SHORTNESS OF BREATH  *UNUSUAL BRUISING OR BLEEDING  TENDERNESS IN MOUTH AND THROAT WITH OR WITHOUT PRESENCE OF ULCERS  *URINARY PROBLEMS  *BOWEL PROBLEMS  UNUSUAL RASH Items with * indicate a potential emergency and should be followed up as soon as possible.  Feel free to call the clinic should you have any questions or concerns. The clinic phone number is [phone].  Please show the [place] at check-in to the Emergency Department and triage nurse.

## 2020-05-16 ENCOUNTER — Other Ambulatory Visit: Payer: Self-pay

## 2020-05-16 ENCOUNTER — Inpatient Hospital Stay: Payer: Medicare HMO | Admitting: Nutrition

## 2020-05-16 ENCOUNTER — Inpatient Hospital Stay: Payer: Medicare HMO

## 2020-05-16 ENCOUNTER — Encounter: Payer: Self-pay | Admitting: Licensed Clinical Social Worker

## 2020-05-16 VITALS — BP 168/85 | HR 82 | Temp 98.4°F | Resp 18

## 2020-05-16 DIAGNOSIS — Z5112 Encounter for antineoplastic immunotherapy: Secondary | ICD-10-CM | POA: Diagnosis not present

## 2020-05-16 DIAGNOSIS — C50911 Malignant neoplasm of unspecified site of right female breast: Secondary | ICD-10-CM

## 2020-05-16 MED ORDER — PEGFILGRASTIM-CBQV 6 MG/0.6ML ~~LOC~~ SOSY
PREFILLED_SYRINGE | SUBCUTANEOUS | Status: AC
  Filled 2020-05-16: qty 0.6

## 2020-05-16 MED ORDER — PEGFILGRASTIM-JMDB 6 MG/0.6ML ~~LOC~~ SOSY
PREFILLED_SYRINGE | SUBCUTANEOUS | Status: AC
  Filled 2020-05-16: qty 0.6

## 2020-05-16 MED ORDER — PEGFILGRASTIM-CBQV 6 MG/0.6ML ~~LOC~~ SOSY
6.0000 mg | PREFILLED_SYRINGE | Freq: Once | SUBCUTANEOUS | Status: AC
  Administered 2020-05-16: 6 mg via SUBCUTANEOUS

## 2020-05-16 NOTE — Progress Notes (Signed)
[facility] CSW Progress Note  Clinical Education officer, museum met with pt's daughter, [name], to review resources given on Friday. Explained breast cancer foundation applications, [employer], and other [employer], including [place]. Patient and daughter will receive help with first and last month's rent through [employer] when they find a new place (waiting on trailer to be ready) and they are also connected with [employer] and [employer] of the [place]. Daughter also agreed to referrals to [facility] and [facility] transportation (if she can accompany her mother on transportation). Referrals made today. CSW also sent message to financial advocate to contact pt/daughter re: [employer].    Christeen Douglas , LCSW

## 2020-05-16 NOTE — Patient Instructions (Signed)
Pegfilgrastim injection What is this medicine? PEGFILGRASTIM (PEG fil gra stim) is a long-acting granulocyte colony-stimulating factor that stimulates the growth of neutrophils, a type of white blood cell important in the body's fight against infection. It is used to reduce the incidence of fever and infection in patients with certain types of cancer who are receiving chemotherapy that affects the bone marrow, and to increase survival after being exposed to high doses of radiation. This medicine may be used for other purposes; ask your health care provider or pharmacist if you have questions. COMMON BRAND NAME(S): Fulphila, Neulasta, Nyvepria, UDENYCA, Ziextenzo What should I tell my health care provider before I take this medicine? They need to know if you have any of these conditions:  kidney disease  latex allergy  ongoing radiation therapy  sickle cell disease  skin reactions to acrylic adhesives (On-Body Injector only)  an unusual or allergic reaction to pegfilgrastim, filgrastim, other medicines, foods, dyes, or preservatives  pregnant or trying to get pregnant  breast-feeding How should I use this medicine? This medicine is for injection under the skin. If you get this medicine at home, you will be taught how to prepare and give the pre-filled syringe or how to use the On-body Injector. Refer to the patient Instructions for Use for detailed instructions. Use exactly as directed. Tell your healthcare provider immediately if you suspect that the On-body Injector may not have performed as intended or if you suspect the use of the On-body Injector resulted in a missed or partial dose. It is important that you put your used needles and syringes in a special sharps container. Do not put them in a trash can. If you do not have a sharps container, call your pharmacist or healthcare provider to get one. Talk to your pediatrician regarding the use of this medicine in children. While this drug  may be prescribed for selected conditions, precautions do apply. Overdosage: If you think you have taken too much of this medicine contact a poison control center or emergency room at once. NOTE: This medicine is only for you. Do not share this medicine with others. What if I miss a dose? It is important not to miss your dose. Call your doctor or health care professional if you miss your dose. If you miss a dose due to an On-body Injector failure or leakage, a new dose should be administered as soon as possible using a single prefilled syringe for manual use. What may interact with this medicine? Interactions have not been studied. This list may not describe all possible interactions. Give your health care provider a list of all the medicines, herbs, non-prescription drugs, or dietary supplements you use. Also tell them if you smoke, drink alcohol, or use illegal drugs. Some items may interact with your medicine. What should I watch for while using this medicine? Your condition will be monitored carefully while you are receiving this medicine. You may need blood work done while you are taking this medicine. Talk to your health care provider about your risk of cancer. You may be more at risk for certain types of cancer if you take this medicine. If you are going to need a MRI, CT scan, or other procedure, tell your doctor that you are using this medicine (On-Body Injector only). What side effects may I notice from receiving this medicine? Side effects that you should report to your doctor or health care professional as soon as possible:  allergic reactions (skin rash, itching or hives, swelling of   the face, lips, or tongue)  back pain  dizziness  fever  pain, redness, or irritation at site where injected  pinpoint red spots on the skin  red or dark-brown urine  shortness of breath or breathing problems  stomach or side pain, or pain at the shoulder  swelling  tiredness  trouble  passing urine or change in the amount of urine  unusual bruising or bleeding Side effects that usually do not require medical attention (report to your doctor or health care professional if they continue or are bothersome):  bone pain  muscle pain This list may not describe all possible side effects. Call your doctor for medical advice about side effects. You may report side effects to FDA at 1-800-FDA-1088. Where should I keep my medicine? Keep out of the reach of children. If you are using this medicine at home, you will be instructed on how to store it. Throw away any unused medicine after the expiration date on the label. NOTE: This sheet is a summary. It may not cover all possible information. If you have questions about this medicine, talk to your doctor, pharmacist, or health care provider.  2021 Elsevier/Gold Standard (2019-02-20 13:20:51)  

## 2020-05-17 ENCOUNTER — Encounter: Payer: Self-pay | Admitting: Nutrition

## 2020-05-17 ENCOUNTER — Telehealth: Payer: Self-pay

## 2020-05-17 NOTE — Progress Notes (Signed)
Patient did not show up for nutrition appointment.  Patient was rescheduled with RD on Friday, April 22 during infusion.  **Disclaimer: This note was dictated with voice recognition software. Similar sounding words can inadvertently be transcribed and this note may contain transcription errors which may not have been corrected upon publication of note.**

## 2020-05-17 NOTE — Telephone Encounter (Signed)
   Elizabeth Chase DOB: 1940-05-23 MRN: [id number]   RIDER WAIVER AND RELEASE OF LIABILITY  For purposes of improving physical access to our facilities, [facility] is pleased to partner with third parties to provide [facility] patients or other authorized individuals the option of convenient, on-demand ground transportation services (the [place]") through use of the technology service that enables users to request on-demand ground transportation from independent third-party providers.  By opting to use and accept these [employer], I, the undersigned, hereby agree on behalf of myself, and on behalf of any minor child using the [employer] for whom I am the parent or legal guardian, as follows:  1. Government social research officer provided to me are provided by independent third-party transportation providers who are not [employer] or employees and who are unaffiliated with [employer]. 2. [facility] is neither a transportation carrier nor a common or public carrier. 3. [facility] has no control over the quality or safety of the transportation that occurs as a result of the [employer]. 4. [facility] cannot guarantee that any third-party transportation provider will complete any arranged transportation service. 5. [facility] makes no representation, warranty, or guarantee regarding the reliability, timeliness, quality, safety, suitability, or availability of any of the Transport Services or that they will be error free. 6. I fully understand that traveling by vehicle involves risks and dangers of serious bodily injury, including permanent disability, paralysis, and death. I agree, on behalf of myself and on behalf of any minor child using the Transport Services for whom I am the parent or legal guardian, that the entire risk arising out of my use of the [employer] remains solely with me, to the maximum extent permitted under applicable law. 7. The [employer] are provided "as is" and "as available." [facility] disclaims all representations and warranties, express, implied or statutory, not expressly set out in these terms, including the implied warranties of merchantability and fitness for a particular purpose. 8. I hereby waive and release [facility], its agents, employees, officers, directors, representatives, insurers, attorneys, assigns, successors, subsidiaries, and affiliates from any and all past, present, or future claims, demands, liabilities, actions, causes of action, or suits of any kind directly or indirectly arising from acceptance and use of the [employer]. 9. I further waive and release [facility] and its affiliates from all present and future liability and responsibility for any injury or death to persons or damages to property caused by or related to the use of the [employer]. 10. I have read this Waiver and Release of Liability, and I understand the terms used in it and their legal significance. This Waiver is freely and voluntarily given with the understanding that my right (as well as the right of any minor child for whom I am the parent or legal guardian using the [employer]) to legal recourse against [facility] in connection with the [employer] is knowingly surrendered in return for use of these services.   I attest that I read the consent document to Elizabeth Chase, gave Elizabeth Chase the opportunity to ask questions and answered the questions asked (if any). I affirm that Elizabeth Chase then provided consent for she's participation in this program.     Legrand Pitts

## 2020-05-19 ENCOUNTER — Encounter: Payer: Self-pay | Admitting: Hematology and Oncology

## 2020-05-19 ENCOUNTER — Encounter: Payer: Self-pay | Admitting: *Deleted

## 2020-05-19 NOTE — Progress Notes (Signed)
Called pt to introduce myself as her Arboriculturist.  Pt has 2 insurances so copay assistance isn't needed.  I spoke to her daughter [name] regarding the [employer], she would like to apply so she will bring pt's income on 05/20/20.  If approved I will give her an expense sheet and my card for any questions or concerns she may have in the future.

## 2020-05-19 NOTE — Progress Notes (Signed)
 Patient Care Team: Pcp, No as PCP - General Stuart, Dawn C, RN as Oncology Nurse Navigator Martini, Keisha N, RN as Oncology Nurse Navigator  DIAGNOSIS:    ICD-10-CM   1. Malignant neoplasm of right breast in female, estrogen receptor positive, unspecified site of breast (HCC)  C50.911    Z17.0     SUMMARY OF ONCOLOGIC HISTORY: Oncology History  Malignant neoplasm of right breast in female, estrogen receptor positive (HCC)  04/18/2020 Initial Diagnosis   Patient presented to the ED with complaint of syncope and found to have a fungating right breast mass. CT showed a right breast mass, 8.1cm, and right axillary adenopathy. Patient reported a right breast mass present for past 2-3 years. Biopsy showed IDC, grade 3, HER-2 equivocal by IHC, positive by FISH (ratio 3.56), ER+ 95%, PR+ 70%, ,Ki67 25%.    04/18/2020 Cancer Staging   Staging form: Breast, AJCC 8th Edition - Clinical stage from 04/18/2020: Stage IIIB (cT4b, cN1, cM0, G3, ER+, PR+, HER2+) - Signed by Causey, Lindsey Cornetto, NP on 04/27/2020 Stage prefix: Initial diagnosis Histologic grading system: 3 grade system   05/13/2020 -  Chemotherapy    Patient is on Treatment Plan: BREAST DOCETAXEL + TRASTUZUMAB + PERTUZUMAB (THP) Q21D        CHIEF COMPLIANT: Cycle 1 TCHP  INTERVAL HISTORY: Elizabeth Chase is a 80 y.o. with above-mentioned history of HER-2 positive right breast cancer currently on neoadjuvant chemotherapy with Taxotere Herceptin Perjeta. She presents to the clinic today for cycle 1.   ALLERGIES:  is allergic to bee venom.  MEDICATIONS:  Current Outpatient Medications  Medication Sig Dispense Refill  . atorvastatin (LIPITOR) 20 MG tablet Take 1 tablet (20 mg total) by mouth daily at 6 PM. 30 tablet 0  . cetirizine (ZYRTEC) 10 MG tablet Take 10 mg daily by mouth.    . cyanocobalamin 1000 MCG tablet Take 1,000 mcg by mouth daily.    . ergocalciferol (VITAMIN D2) 1.25 MG (50000 UT) capsule Take 50,000 Units by mouth  once a week.    . fluticasone furoate-vilanterol (BREO ELLIPTA) 200-25 MCG/INH AEPB Inhale 1 puff into the lungs daily.    . lidocaine-prilocaine (EMLA) cream Apply to affected area once 30 g 3  . ondansetron (ZOFRAN) 8 MG tablet Take 1 tablet (8 mg total) by mouth 2 (two) times daily as needed for refractory nausea / vomiting. 30 tablet 1  . prochlorperazine (COMPAZINE) 10 MG tablet Take 1 tablet (10 mg total) by mouth every 6 (six) hours as needed (Nausea or vomiting). 30 tablet 1  . trazodone (DESYREL) 300 MG tablet Take 300 mg by mouth at bedtime.     No current facility-administered medications for this visit.    PHYSICAL EXAMINATION: ECOG PERFORMANCE STATUS: 1 - Symptomatic but completely ambulatory  There were no vitals filed for this visit. There were no vitals filed for this visit.    LABORATORY DATA:  I have reviewed the data as listed CMP Latest Ref Rng & Units 05/13/2020 04/24/2020 04/20/2020  Glucose 70 - 99 mg/dL 109(H) 107(H) 110(H)  BUN 8 - 23 mg/dL 12 11 13  Creatinine 0.44 - 1.00 mg/dL 0.79 0.79 0.75  Sodium 135 - 145 mmol/L 142 139 138  Potassium 3.5 - 5.1 mmol/L 4.3 3.8 4.2  Chloride 98 - 111 mmol/L 107 105 106  CO2 22 - 32 mmol/L 24 26 21(L)  Calcium 8.9 - 10.3 mg/dL 8.7(L) 8.8(L) 8.4(L)  Total Protein 6.5 - 8.1 g/dL 6.8 - 5.6(L)    Total Bilirubin 0.3 - 1.2 mg/dL 0.4 - 0.5  Alkaline Phos 38 - 126 U/L 68 - 43  AST 15 - 41 U/L 15 - 24  ALT 0 - 44 U/L 11 - 11    Lab Results  Component Value Date   WBC 6.6 05/13/2020   HGB 11.3 (L) 05/13/2020   HCT 34.9 (L) 05/13/2020   MCV 89.0 05/13/2020   PLT 292 05/13/2020   NEUTROABS 4.7 05/13/2020    ASSESSMENT & PLAN:  Malignant neoplasm of right breast in female, estrogen receptor positive (HCC) Stage IIIc fungating tumor of the right breast: ER/PR and HER-2 positive 04/15/20: CT CAP: No distant mets  Treatment Plan: 1. Neoadj chemo with Taxotere Herceptin Perjeta 2. Mastectomy with sentinel node biopsy 3. Adj  XRT 4. Adj Anti estrogen therapy -------------------------------------------------------------------------------------------------- Current treatment: Cycle 1 day 8 Taxotere Herceptin Perjeta Echocardiogram 04/23/2020: EF 55% Chemo toxicities: 1.  Mild nausea 2. Fatigue 3.  Anemia: Probably due to bleeding from the tumor 4.  Thrush: I sent a prescription for nystatin  Poor nutrition: I sent for boost to supplement prescription. Return to clinic in 2 weeks for cycle 2  No orders of the defined types were placed in this encounter.  The patient has a good understanding of the overall plan. she agrees with it. she will call with any problems that may develop before the next visit here.  Total time spent: 30 mins including face to face time and time spent for planning, charting and coordination of care  Vinay K Gudena, MD, MPH 05/20/2020  I, Molly Dorshimer, am acting as scribe for Dr. Vinay Gudena.  I have reviewed the above documentation for accuracy and completeness, and I agree with the above.       

## 2020-05-20 ENCOUNTER — Inpatient Hospital Stay (HOSPITAL_BASED_OUTPATIENT_CLINIC_OR_DEPARTMENT_OTHER): Payer: Medicare HMO | Admitting: Hematology and Oncology

## 2020-05-20 ENCOUNTER — Inpatient Hospital Stay: Payer: Medicare HMO

## 2020-05-20 ENCOUNTER — Other Ambulatory Visit: Payer: Self-pay

## 2020-05-20 DIAGNOSIS — C50911 Malignant neoplasm of unspecified site of right female breast: Secondary | ICD-10-CM | POA: Diagnosis not present

## 2020-05-20 DIAGNOSIS — Z95828 Presence of other vascular implants and grafts: Secondary | ICD-10-CM

## 2020-05-20 DIAGNOSIS — Z170 Estrogen receptor positive status [ER+]: Secondary | ICD-10-CM

## 2020-05-20 DIAGNOSIS — Z5112 Encounter for antineoplastic immunotherapy: Secondary | ICD-10-CM | POA: Diagnosis not present

## 2020-05-20 LAB — CBC WITH DIFFERENTIAL (CANCER CENTER ONLY)
Abs Immature Granulocytes: 1.26 10*3/uL — ABNORMAL HIGH (ref 0.00–0.07)
Basophils Absolute: 0.1 10*3/uL (ref 0.0–0.1)
Basophils Relative: 0 %
Eosinophils Absolute: 0.1 10*3/uL (ref 0.0–0.5)
Eosinophils Relative: 1 %
HCT: 30.1 % — ABNORMAL LOW (ref 36.0–46.0)
Hemoglobin: 9.8 g/dL — ABNORMAL LOW (ref 12.0–15.0)
Immature Granulocytes: 7 %
Lymphocytes Relative: 9 %
Lymphs Abs: 1.6 10*3/uL (ref 0.7–4.0)
MCH: 29 pg (ref 26.0–34.0)
MCHC: 32.6 g/dL (ref 30.0–36.0)
MCV: 89.1 fL (ref 80.0–100.0)
Monocytes Absolute: 2.9 10*3/uL — ABNORMAL HIGH (ref 0.1–1.0)
Monocytes Relative: 16 %
Neutro Abs: 11.7 10*3/uL — ABNORMAL HIGH (ref 1.7–7.7)
Neutrophils Relative %: 67 %
Platelet Count: 316 10*3/uL (ref 150–400)
RBC: 3.38 MIL/uL — ABNORMAL LOW (ref 3.87–5.11)
RDW: 14 % (ref 11.5–15.5)
WBC Count: 17.6 10*3/uL — ABNORMAL HIGH (ref 4.0–10.5)
nRBC: 0.6 % — ABNORMAL HIGH (ref 0.0–0.2)

## 2020-05-20 LAB — CMP (CANCER CENTER ONLY)
ALT: 13 U/L (ref 0–44)
AST: 18 U/L (ref 15–41)
Albumin: 3.3 g/dL — ABNORMAL LOW (ref 3.5–5.0)
Alkaline Phosphatase: 119 U/L (ref 38–126)
Anion gap: 12 (ref 5–15)
BUN: 14 mg/dL (ref 8–23)
CO2: 23 mmol/L (ref 22–32)
Calcium: 8.8 mg/dL — ABNORMAL LOW (ref 8.9–10.3)
Chloride: 104 mmol/L (ref 98–111)
Creatinine: 0.79 mg/dL (ref 0.44–1.00)
GFR, Estimated: >60 mL/min (ref >60)
Glucose, Bld: 95 mg/dL (ref 70–99)
Potassium: 4.1 mmol/L (ref 3.5–5.1)
Sodium: 139 mmol/L (ref 135–145)
Total Bilirubin: 0.4 mg/dL (ref 0.3–1.2)
Total Protein: 6.4 g/dL — ABNORMAL LOW (ref 6.5–8.1)

## 2020-05-20 MED ORDER — BOOST HIGH PROTEIN PO LIQD: 1.0000 | Freq: Three times a day (TID) | ORAL | 6 refills | Status: AC

## 2020-05-20 MED ORDER — ALBUTEROL SULFATE HFA 108 (90 BASE) MCG/ACT IN AERS: 2.0000 | INHALATION_SPRAY | Freq: Four times a day (QID) | RESPIRATORY_TRACT | 2 refills | Status: DC | PRN

## 2020-05-20 MED ORDER — SODIUM CHLORIDE 0.9% FLUSH
10.0000 mL | Freq: Once | INTRAVENOUS | Status: AC
  Administered 2020-05-20: 10 mL
  Filled 2020-05-20: qty 10

## 2020-05-20 MED ORDER — HEPARIN SOD (PORK) LOCK FLUSH 100 UNIT/ML IV SOLN
500.0000 [IU] | Freq: Once | INTRAVENOUS | Status: AC
  Administered 2020-05-20: 500 [IU]
  Filled 2020-05-20: qty 5

## 2020-05-20 MED ORDER — NYSTATIN 100000 UNIT/ML MT SUSP: 5.0000 mL | Freq: Four times a day (QID) | OROMUCOSAL | 0 refills | Status: DC

## 2020-05-20 NOTE — Assessment & Plan Note (Signed)
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 Neoadj chemo with Taxotere Herceptin Perjeta 2. Mastectomy with sentinel node biopsy 3. Adj XRT 4. Adj Anti estrogen therapy -------------------------------------------------------------------------------------------------- Current treatment: Cycle 1 Taxotere Herceptin Perjeta Echocardiogram 04/23/2020: EF 55% Labs reviewed, chemo consent obtained, chemo education completed, antiemetics were reviewed Return to clinic in 3 weeks for cycle 2

## 2020-05-23 ENCOUNTER — Encounter: Payer: Self-pay | Admitting: Licensed Clinical Social Worker

## 2020-05-23 NOTE — Progress Notes (Signed)
TC from daughter, [name], with questions about assistance obtaining boost/ensure and depends. CSW sent message to RD team re: boost/ensure. For the Depends, will inquire if MD is able to write order to see if insurance will cover them.  Per [name], she has spoken with [facility] and is going to meet with them regarding services available.  Their car broke down this weekend and [name] has someone coming to buy it and will hopefully be able to utilize those funds to get another vehicle. CSW reminded of [facility] transport and discussed [facility] [place].     Christeen Douglas, LCSW

## 2020-05-25 ENCOUNTER — Telehealth: Payer: Self-pay

## 2020-05-25 NOTE — Telephone Encounter (Signed)
REFERRAL ONLY FROM [facility] PA-C [phone], SENT REFERRAL TO SCHEDULING

## 2020-06-03 ENCOUNTER — Other Ambulatory Visit: Payer: Self-pay

## 2020-06-03 ENCOUNTER — Encounter: Payer: Self-pay | Admitting: Licensed Clinical Social Worker

## 2020-06-03 ENCOUNTER — Other Ambulatory Visit: Payer: Medicare HMO

## 2020-06-03 ENCOUNTER — Inpatient Hospital Stay: Payer: Medicare HMO

## 2020-06-03 ENCOUNTER — Inpatient Hospital Stay (HOSPITAL_BASED_OUTPATIENT_CLINIC_OR_DEPARTMENT_OTHER): Payer: Medicare HMO | Admitting: Medical

## 2020-06-03 ENCOUNTER — Inpatient Hospital Stay: Payer: Medicare HMO | Admitting: Dietician

## 2020-06-03 VITALS — BP 146/57 | HR 77 | Temp 97.1°F | Resp 19 | Ht 65.0 in | Wt 122.6 lb

## 2020-06-03 DIAGNOSIS — Z170 Estrogen receptor positive status [ER+]: Secondary | ICD-10-CM

## 2020-06-03 DIAGNOSIS — C50911 Malignant neoplasm of unspecified site of right female breast: Secondary | ICD-10-CM

## 2020-06-03 DIAGNOSIS — Z5112 Encounter for antineoplastic immunotherapy: Secondary | ICD-10-CM | POA: Diagnosis not present

## 2020-06-03 DIAGNOSIS — Z95828 Presence of other vascular implants and grafts: Secondary | ICD-10-CM

## 2020-06-03 LAB — CBC WITH DIFFERENTIAL (CANCER CENTER ONLY)
Abs Immature Granulocytes: 0.01 10*3/uL (ref 0.00–0.07)
Basophils Absolute: 0.1 10*3/uL (ref 0.0–0.1)
Basophils Relative: 1 %
Eosinophils Absolute: 0 10*3/uL (ref 0.0–0.5)
Eosinophils Relative: 1 %
HCT: 28 % — ABNORMAL LOW (ref 36.0–46.0)
Hemoglobin: 8.7 g/dL — ABNORMAL LOW (ref 12.0–15.0)
Immature Granulocytes: 0 %
Lymphocytes Relative: 14 %
Lymphs Abs: 0.9 10*3/uL (ref 0.7–4.0)
MCH: 28.5 pg (ref 26.0–34.0)
MCHC: 31.1 g/dL (ref 30.0–36.0)
MCV: 91.8 fL (ref 80.0–100.0)
Monocytes Absolute: 0.5 10*3/uL (ref 0.1–1.0)
Monocytes Relative: 8 %
Neutro Abs: 4.6 10*3/uL (ref 1.7–7.7)
Neutrophils Relative %: 76 %
Platelet Count: 416 10*3/uL — ABNORMAL HIGH (ref 150–400)
RBC: 3.05 MIL/uL — ABNORMAL LOW (ref 3.87–5.11)
RDW: 14.8 % (ref 11.5–15.5)
WBC Count: 6 10*3/uL (ref 4.0–10.5)
nRBC: 0 % (ref 0.0–0.2)

## 2020-06-03 LAB — CMP (CANCER CENTER ONLY)
ALT: 7 U/L (ref 0–44)
AST: 13 U/L — ABNORMAL LOW (ref 15–41)
Albumin: 3.1 g/dL — ABNORMAL LOW (ref 3.5–5.0)
Alkaline Phosphatase: 54 U/L (ref 38–126)
Anion gap: 7 (ref 5–15)
BUN: 16 mg/dL (ref 8–23)
CO2: 26 mmol/L (ref 22–32)
Calcium: 8.3 mg/dL — ABNORMAL LOW (ref 8.9–10.3)
Chloride: 107 mmol/L (ref 98–111)
Creatinine: 0.64 mg/dL (ref 0.44–1.00)
GFR, Estimated: >60 mL/min (ref >60)
Glucose, Bld: 108 mg/dL — ABNORMAL HIGH (ref 70–99)
Potassium: 4.1 mmol/L (ref 3.5–5.1)
Sodium: 140 mmol/L (ref 135–145)
Total Bilirubin: <0.2 mg/dL — ABNORMAL LOW (ref 0.3–1.2)
Total Protein: 6.3 g/dL — ABNORMAL LOW (ref 6.5–8.1)

## 2020-06-03 MED ORDER — ACETAMINOPHEN 325 MG PO TABS
650.0000 mg | ORAL_TABLET | Freq: Once | ORAL | Status: AC
  Administered 2020-06-03: 650 mg via ORAL

## 2020-06-03 MED ORDER — DIPHENHYDRAMINE HCL 25 MG PO CAPS
25.0000 mg | ORAL_CAPSULE | Freq: Once | ORAL | Status: AC
  Administered 2020-06-03: 25 mg via ORAL

## 2020-06-03 MED ORDER — TRASTUZUMAB-ANNS CHEMO 150 MG IV SOLR
6.0000 mg/kg | Freq: Once | INTRAVENOUS | Status: AC
  Administered 2020-06-03: 336 mg via INTRAVENOUS
  Filled 2020-06-03: qty 16

## 2020-06-03 MED ORDER — SODIUM CHLORIDE 0.9 % IV SOLN
Freq: Once | INTRAVENOUS | Status: AC
Start: 2020-06-03 — End: 2020-06-03
  Filled 2020-06-03: qty 250

## 2020-06-03 MED ORDER — HEPARIN SOD (PORK) LOCK FLUSH 100 UNIT/ML IV SOLN
500.0000 [IU] | Freq: Once | INTRAVENOUS | Status: AC | PRN
  Administered 2020-06-03: 500 [IU]
  Filled 2020-06-03: qty 5

## 2020-06-03 MED ORDER — ACETAMINOPHEN 325 MG PO TABS
ORAL_TABLET | ORAL | Status: AC
  Filled 2020-06-03: qty 2

## 2020-06-03 MED ORDER — DIPHENHYDRAMINE HCL 25 MG PO CAPS
ORAL_CAPSULE | ORAL | Status: AC
  Filled 2020-06-03: qty 1

## 2020-06-03 MED ORDER — ALPRAZOLAM 0.25 MG PO TABS: 0.2500 mg | ORAL_TABLET | Freq: Every evening | ORAL | 0 refills | Status: DC | PRN

## 2020-06-03 MED ORDER — SODIUM CHLORIDE 0.9 % IV SOLN
50.0000 mg/m2 | Freq: Once | INTRAVENOUS | Status: AC
  Administered 2020-06-03: 80 mg via INTRAVENOUS
  Filled 2020-06-03: qty 8

## 2020-06-03 MED ORDER — SODIUM CHLORIDE 0.9% FLUSH
10.0000 mL | INTRAVENOUS | Status: DC | PRN
  Administered 2020-06-03: 10 mL
  Filled 2020-06-03: qty 10

## 2020-06-03 MED ORDER — SODIUM CHLORIDE 0.9 % IV SOLN
420.0000 mg | Freq: Once | INTRAVENOUS | Status: AC
  Administered 2020-06-03: 420 mg via INTRAVENOUS
  Filled 2020-06-03: qty 14

## 2020-06-03 MED ORDER — SODIUM CHLORIDE 0.9% FLUSH
10.0000 mL | Freq: Once | INTRAVENOUS | Status: AC
  Administered 2020-06-03: 10 mL
  Filled 2020-06-03: qty 10

## 2020-06-03 MED ORDER — SODIUM CHLORIDE 0.9 % IV SOLN
10.0000 mg | Freq: Once | INTRAVENOUS | Status: AC
  Administered 2020-06-03: 10 mg via INTRAVENOUS
  Filled 2020-06-03: qty 10

## 2020-06-03 NOTE — Patient Instructions (Signed)
Implanted Port Insertion, Care After This sheet gives you information about how to care for yourself after your procedure. Your health care provider may also give you more specific instructions. If you have problems or questions, contact your health care provider. What can I expect after the procedure? After the procedure, it is common to have:  Discomfort at the port insertion site.  Bruising on the skin over the port. This should improve over 3-4 days. Follow these instructions at home: Port care  After your port is placed, you will get a manufacturer's information card. The card has information about your port. Keep this card with you at all times.  Take care of the port as told by your health care provider. Ask your health care provider if you or a family member can get training for taking care of the port at home. A home health care nurse may also take care of the port.  Make sure to remember what type of port you have. Incision care  Follow instructions from your health care provider about how to take care of your port insertion site. Make sure you: ? Wash your hands with soap and water before and after you change your bandage (dressing). If soap and water are not available, use hand sanitizer. ? Change your dressing as told by your health care provider. ? Leave stitches (sutures), skin glue, or adhesive strips in place. These skin closures may need to stay in place for 2 weeks or longer. If adhesive strip edges start to loosen and curl up, you may trim the loose edges. Do not remove adhesive strips completely unless your health care provider tells you to do that.  Check your port insertion site every day for signs of infection. Check for: ? Redness, swelling, or pain. ? Fluid or blood. ? Warmth. ? Pus or a bad smell.      Activity  Return to your normal activities as told by your health care provider. Ask your health care provider what activities are safe for you.  Do not  lift anything that is heavier than 10 lb (4.5 kg), or the limit that you are told, until your health care provider says that it is safe. General instructions  Take over-the-counter and prescription medicines only as told by your health care provider.  Do not take baths, swim, or use a hot tub until your health care provider approves. Ask your health care provider if you may take showers. You may only be allowed to take sponge baths.  Do not drive for 24 hours if you were given a sedative during your procedure.  Wear a medical alert bracelet in case of an emergency. This will tell any health care providers that you have a port.  Keep all follow-up visits as told by your health care provider. This is important. Contact a health care provider if:  You cannot flush your port with saline as directed, or you cannot draw blood from the port.  You have a fever or chills.  You have redness, swelling, or pain around your port insertion site.  You have fluid or blood coming from your port insertion site.  Your port insertion site feels warm to the touch.  You have pus or a bad smell coming from the port insertion site. Get help right away if:  You have chest pain or shortness of breath.  You have bleeding from your port that you cannot control. Summary  Take care of the port as told by your   health care provider. Keep the manufacturer's information card with you at all times.  Change your dressing as told by your health care provider.  Contact a health care provider if you have a fever or chills or if you have redness, swelling, or pain around your port insertion site.  Keep all follow-up visits as told by your health care provider. This information is not intended to replace advice given to you by your health care provider. Make sure you discuss any questions you have with your health care provider. Document Revised: 08/27/2017 Document Reviewed: 08/27/2017 Elsevier Patient Education   2021 Elsevier Inc.  

## 2020-06-03 NOTE — Progress Notes (Signed)
Symptoms Management Clinic Progress Note   Elizabeth Chase [id number] Nov 06, 1940 80 y.o.  Elizabeth Chase is managed by Dr. Nicholas Lose  Actively treated with chemotherapy/immunotherapy/hormonal therapy: yes  Current therapy: Taxotere, [place], and Kanjinti with Udenyca support  Last treated: 05/13/2020 (cycle 1, day 1)  Next scheduled appointment with provider: 06/24/2020  Assessment: Plan:    Malignant neoplasm of right breast in female, estrogen receptor positive, unspecified site of breast ([place])   ER positive malignant neoplasm of the right breast: [name] continues to be managed by Dr. Nicholas Lose and presents to clinic today for cycle 2, day 1 of Taxotere, Perjeta, and Kanjinti which is given with Udenyca support.  We will proceed with treatment today and will have her return to the clinic as scheduled on 06/24/2020 for consideration of further treatment.  Please see After Visit Summary for patient specific instructions.  Future Appointments  Date Time Provider [place]  06/24/2020 10:45 AM [facility] [place] FLUSH [facility] None  06/24/2020 11:15 AM Nicholas Lose, MD [facility] None  06/24/2020 12:00 PM Morrell Riddle, RD [facility] None  06/24/2020 12:15 PM [facility] INFUSION [facility] None  06/27/2020 12:15 PM [facility] [place] None  07/05/2020 10:30 AM [name], MD [facility]    No orders of the defined types were placed in this encounter.      Subjective:   Patient ID:  Elizabeth Chase is a 80 y.o. (DOB 1940-09-05) female.  Chief Complaint: No chief complaint on file.   HPI Elizabeth Chase is a 80 y.o. female with a diagnosis of an ER positive malignant neoplasm of the right breast.  She is managed by Dr. Nicholas Lose and presents to clinic today for cycle 2, day 1 of Taxotere, Perjeta, and Kanjinti which is given with Udenyca support.  She is awaiting home health to come for wound care.  She is running out of  supplies to dress her right chest wall wound.  The patient's daughter reports that the large fungating mass in her right chest wall is smaller in size.  She denies fevers, chills, nausea, vomiting, constipation, or diarrhea.   Medications: I have reviewed the patient's current medications.  Allergies:  Allergies  Allergen Reactions  . Bee Venom     Past Medical History:  Diagnosis Date  . Hypertension   . Stroke [facility])     Past Surgical History:  Procedure Laterality Date  . IR IMAGING GUIDED PORT INSERTION  04/26/2020  . IR US GUIDE VASC ACCESS RIGHT  04/26/2020    Family History  Problem Relation Age of Onset  . Diabetes Mother   . Cancer Father   . Lung cancer Father   . Breast cancer Sister   . Lung cancer Son     Social History   Socioeconomic History  . Marital status: Widowed    Spouse name: Not on file  . Number of children: Not on file  . Years of education: Not on file  . Highest education level: Not on file  Occupational History  . Not on file  Tobacco Use  . Smoking status: Former Smoker    Packs/day: 1.00    Types: Cigarettes    Quit date: 12/31/2016    Years since quitting: 3.4  . Smokeless tobacco: Never Used  Vaping Use  . Vaping Use: Not on file  Substance and Sexual Activity  . Alcohol use: No  . Drug use: No  . Sexual activity: Not on file  Other Topics Concern  . Not on file  Social History Narrative  . Not on file   Social Determinants of Health   Financial Resource Strain: High Risk  . Difficulty of Paying Living Expenses: Very hard  Food Insecurity: Food Insecurity Present  . Worried About Charity fundraiser in the Last Year: Often true  . Ran Out of Food in the Last Year: Not on file  Transportation Needs: Unmet Transportation Needs  . Lack of Transportation (Medical): Yes  . Lack of Transportation (Non-Medical): No  Physical Activity: Not on file  Stress: Not on file  Social Connections: Not on file  Intimate Partner  Violence: Not on file    Past Medical History, Surgical history, Social history, and Family history were reviewed and updated as appropriate.   Please see review of systems for further details on the patient's review from today.   Review of Systems:  Review of Systems  Constitutional: Negative for chills, diaphoresis and fever.  HENT: Negative for trouble swallowing and voice change.   Respiratory: Negative for cough, chest tightness, shortness of breath and wheezing.   Cardiovascular: Negative for chest pain and palpitations.  Gastrointestinal: Negative for abdominal pain, constipation, diarrhea, nausea and vomiting.  Musculoskeletal: Negative for back pain and myalgias.  Skin: Positive for wound.       Fungating mass in the right chest wall is smaller in size.  The patient and her daughters report.  Neurological: Negative for dizziness, light-headedness and headaches.    Objective:   Physical Exam:  BP (!) 146/57 (BP Location: Right Arm, Patient Position: Sitting)   Pulse 77   Temp (!) 97.1 F (36.2 C) (Tympanic)   Resp 19   Ht 5\' 5"  (1.651 m)   Wt 122 lb 9.6 oz (55.6 kg)   SpO2 94%   BMI 20.40 kg/m  ECOG: 0  Physical Exam Constitutional:      General: She is not in acute distress.    Appearance: She is not diaphoretic.  HENT:     Head: Normocephalic and atraumatic.  Eyes:     General: No scleral icterus.       Right eye: No discharge.        Left eye: No discharge.  Cardiovascular:     Rate and Rhythm: Normal rate and regular rhythm.     Heart sounds: Normal heart sounds. No murmur heard. No friction rub. No gallop.   Pulmonary:     Effort: Pulmonary effort is normal. No respiratory distress.     Breath sounds: Normal breath sounds. No wheezing or rales.  Abdominal:     General: Bowel sounds are normal. There is no distension.     Tenderness: There is no abdominal tenderness. There is no guarding.  Skin:    General: Skin is warm and dry.     Findings:  Lesion present. No erythema or rash.     Comments: There is a moist fungating mass in the patient's right chest wall.  According to the patient and her daughter that this area is smaller in size.  The area was evaluated and was redressed with Vaseline gauze, Telfa dressings and a large absorbent pad.  Neurological:     Mental Status: She is alert.     Coordination: Coordination normal.     Gait: Gait normal.  Psychiatric:        Mood and Affect: Mood normal.        Behavior: Behavior normal.  Thought Content: Thought content normal.        Judgment: Judgment normal.     Lab Review:     Component Value Date/Time   NA 140 06/03/2020 1025   K 4.1 06/03/2020 1025   CL 107 06/03/2020 1025   CO2 26 06/03/2020 1025   GLUCOSE 108 (H) 06/03/2020 1025   BUN 16 06/03/2020 1025   CREATININE 0.64 06/03/2020 1025   CALCIUM 8.3 (L) 06/03/2020 1025   PROT 6.3 (L) 06/03/2020 1025   ALBUMIN 3.1 (L) 06/03/2020 1025   AST 13 (L) 06/03/2020 1025   ALT 7 06/03/2020 1025   ALKPHOS 54 06/03/2020 1025   BILITOT <0.2 (L) 06/03/2020 1025   GFRNONAA >60 06/03/2020 1025   GFRAA >60 01/02/2017 0228       Component Value Date/Time   WBC 6.0 06/03/2020 1025   WBC 6.1 04/24/2020 0137   RBC 3.05 (L) 06/03/2020 1025   HGB 8.7 (L) 06/03/2020 1025   HCT 28.0 (L) 06/03/2020 1025   PLT 416 (H) 06/03/2020 1025   MCV 91.8 06/03/2020 1025   MCH 28.5 06/03/2020 1025   MCHC 31.1 06/03/2020 1025   RDW 14.8 06/03/2020 1025   LYMPHSABS 0.9 06/03/2020 1025   MONOABS 0.5 06/03/2020 1025   EOSABS 0.0 06/03/2020 1025   BASOSABS 0.1 06/03/2020 1025   -------------------------------  Imaging from last 24 hours (if applicable):  Radiology interpretation: No results found.    OK to treat.  Sandi Mealy, MHS, PA-C  The patient was unintentionally given a prescription for Xanax 0.25 mg p.o. nightly as needed for insomnia.  #30 were dispensed with no refills.  The pharmacy was contacted however the patient  already picked up this prescription.  Her point of contact [name] who is her daughter was contacted with a message left that her mother should not take this medication but she continue taking her trazodone as previously prescribed.  Sandi Mealy, MHS, PA-C Physician Assistant

## 2020-06-03 NOTE — Progress Notes (Signed)
[facility] CSW Progress Note  Holiday representative met with patient and daughter, [name], in exam room. Provided second set of [employer].  Pt/daughter provided applications for [employer] which CSW submitted today.  They will continue to work on Publishing copy in [employer] and [facility] [employer].  Provided number for Legal Aid to help with summons due to late payments on a loan.  Daughter spoke with [facility], but unsure if they will utilize those services yet as cost will be $50/month.  Car is still not working, so they are using [facility] transport for appts. CSW reminded them that that transportation can be used for any [place] appts, including non-cancer center appts.    Christeen Douglas , LCSW

## 2020-06-03 NOTE — Patient Instructions (Signed)
Trastuzumab injection for infusion What is this medicine? TRASTUZUMAB (tras TOO zoo mab) is a monoclonal antibody. It is used to treat breast cancer and stomach cancer. This medicine may be used for other purposes; ask your health care provider or pharmacist if you have questions. COMMON BRAND NAME(S): Herceptin, Herzuma, KANJINTI, Ogivri, Ontruzant, Trazimera What should I tell my health care provider before I take this medicine? They need to know if you have any of these conditions:  heart disease  heart failure  lung or breathing disease, like asthma  an unusual or allergic reaction to trastuzumab, benzyl alcohol, or other medications, foods, dyes, or preservatives  pregnant or trying to get pregnant  breast-feeding How should I use this medicine? This drug is given as an infusion into a vein. It is administered in a hospital or clinic by a specially trained health care professional. Talk to your pediatrician regarding the use of this medicine in children. This medicine is not approved for use in children. Overdosage: If you think you have taken too much of this medicine contact a poison control center or emergency room at once. NOTE: This medicine is only for you. Do not share this medicine with others. What if I miss a dose? It is important not to miss a dose. Call your doctor or health care professional if you are unable to keep an appointment. What may interact with this medicine? This medicine may interact with the following medications:  certain types of chemotherapy, such as daunorubicin, doxorubicin, epirubicin, and idarubicin This list may not describe all possible interactions. Give your health care provider a list of all the medicines, herbs, non-prescription drugs, or dietary supplements you use. Also tell them if you smoke, drink alcohol, or use illegal drugs. Some items may interact with your medicine. What should I watch for while using this medicine? Visit your  doctor for checks on your progress. Report any side effects. Continue your course of treatment even though you feel ill unless your doctor tells you to stop. Call your doctor or health care professional for advice if you get a fever, chills or sore throat, or other symptoms of a cold or flu. Do not treat yourself. Try to avoid being around people who are sick. You may experience fever, chills and shaking during your first infusion. These effects are usually mild and can be treated with other medicines. Report any side effects during the infusion to your health care professional. Fever and chills usually do not happen with later infusions. Do not become pregnant while taking this medicine or for 7 months after stopping it. Women should inform their doctor if they wish to become pregnant or think they might be pregnant. Women of child-bearing potential will need to have a negative pregnancy test before starting this medicine. There is a potential for serious side effects to an unborn child. Talk to your health care professional or pharmacist for more information. Do not breast-feed an infant while taking this medicine or for 7 months after stopping it. Women must use effective birth control with this medicine. What side effects may I notice from receiving this medicine? Side effects that you should report to your doctor or health care professional as soon as possible:  allergic reactions like skin rash, itching or hives, swelling of the face, lips, or tongue  chest pain or palpitations  cough  dizziness  feeling faint or lightheaded, falls  fever  general ill feeling or flu-like symptoms  signs of worsening heart failure like   breathing problems; swelling in your legs and feet  unusually weak or tired Side effects that usually do not require medical attention (report to your doctor or health care professional if they continue or are bothersome):  bone pain  changes in  taste  diarrhea  joint pain  nausea/vomiting  weight loss This list may not describe all possible side effects. Call your doctor for medical advice about side effects. You may report side effects to FDA at 1-800-FDA-1088. Where should I keep my medicine? This drug is given in a hospital or clinic and will not be stored at home. NOTE: This sheet is a summary. It may not cover all possible information. If you have questions about this medicine, talk to your doctor, pharmacist, or health care provider.  2021 Elsevier/Gold Standard (2016-01-24 14:37:52) Pertuzumab injection What is this medicine? PERTUZUMAB (per TOOZ ue mab) is a monoclonal antibody. It is used to treat breast cancer. This medicine may be used for other purposes; ask your health care provider or pharmacist if you have questions. COMMON BRAND NAME(S): PERJETA What should I tell my health care provider before I take this medicine? They need to know if you have any of these conditions:  heart disease  heart failure  high blood pressure  history of irregular heart beat  recent or ongoing radiation therapy  an unusual or allergic reaction to pertuzumab, other medicines, foods, dyes, or preservatives  pregnant or trying to get pregnant  breast-feeding How should I use this medicine? This medicine is for infusion into a vein. It is given by a health care professional in a hospital or clinic setting. Talk to your pediatrician regarding the use of this medicine in children. Special care may be needed. Overdosage: If you think you have taken too much of this medicine contact a poison control center or emergency room at once. NOTE: This medicine is only for you. Do not share this medicine with others. What if I miss a dose? It is important not to miss your dose. Call your doctor or health care professional if you are unable to keep an appointment. What may interact with this medicine? Interactions are not  expected. Give your health care provider a list of all the medicines, herbs, non-prescription drugs, or dietary supplements you use. Also tell them if you smoke, drink alcohol, or use illegal drugs. Some items may interact with your medicine. This list may not describe all possible interactions. Give your health care provider a list of all the medicines, herbs, non-prescription drugs, or dietary supplements you use. Also tell them if you smoke, drink alcohol, or use illegal drugs. Some items may interact with your medicine. What should I watch for while using this medicine? Your condition will be monitored carefully while you are receiving this medicine. Report any side effects. Continue your course of treatment even though you feel ill unless your doctor tells you to stop. Do not become pregnant while taking this medicine or for 7 months after stopping it. Women should inform their doctor if they wish to become pregnant or think they might be pregnant. Women of child-bearing potential will need to have a negative pregnancy test before starting this medicine. There is a potential for serious side effects to an unborn child. Talk to your health care professional or pharmacist for more information. Do not breast-feed an infant while taking this medicine or for 7 months after stopping it. Women must use effective birth control with this medicine. Call your doctor or health  health care professional for advice if you get a fever, chills or sore throat, or other symptoms of a cold or flu. Do not treat yourself. Try to avoid being around people who are sick. You may experience fever, chills, and headache during the infusion. Report any side effects during the infusion to your health care professional. What side effects may I notice from receiving this medicine? Side effects that you should report to your doctor or health care professional as soon as possible:  breathing problems  chest pain or  palpitations  dizziness  feeling faint or lightheaded  fever or chills  skin rash, itching or hives  sore throat  swelling of the face, lips, or tongue  swelling of the legs or ankles  unusually weak or tired Side effects that usually do not require medical attention (report to your doctor or health care professional if they continue or are bothersome):  diarrhea  hair loss  nausea, vomiting  tiredness This list may not describe all possible side effects. Call your doctor for medical advice about side effects. You may report side effects to FDA at 1-800-FDA-1088. Where should I keep my medicine? This drug is given in a hospital or clinic and will not be stored at home. NOTE: This sheet is a summary. It may not cover all possible information. If you have questions about this medicine, talk to your doctor, pharmacist, or health care provider.  2021 Elsevier/Gold Standard (2015-03-03 12:08:50)  Docetaxel injection What is this medicine? DOCETAXEL (doe se TAX el) is a chemotherapy drug. It targets fast dividing cells, like cancer cells, and causes these cells to die. This medicine is used to treat many types of cancers like breast cancer, certain stomach cancers, head and neck cancer, lung cancer, and prostate cancer. This medicine may be used for other purposes; ask your health care provider or pharmacist if you have questions. COMMON BRAND NAME(S): Docefrez, Taxotere What should I tell my health care provider before I take this medicine? They need to know if you have any of these conditions:  infection (especially a virus infection such as chickenpox, cold sores, or herpes)  liver disease  low blood counts, like low white cell, platelet, or red cell counts  an unusual or allergic reaction to docetaxel, polysorbate 80, other chemotherapy agents, other medicines, foods, dyes, or preservatives  pregnant or trying to get pregnant  breast-feeding How should I use this  medicine? This drug is given as an infusion into a vein. It is administered in a hospital or clinic by a specially trained health care professional. Talk to your pediatrician regarding the use of this medicine in children. Special care may be needed. Overdosage: If you think you have taken too much of this medicine contact a poison control center or emergency room at once. NOTE: This medicine is only for you. Do not share this medicine with others. What if I miss a dose? It is important not to miss your dose. Call your doctor or health care professional if you are unable to keep an appointment. What may interact with this medicine? Do not take this medicine with any of the following medications:  live virus vaccines This medicine may also interact with the following medications:  aprepitant  certain antibiotics like erythromycin or clarithromycin  certain antivirals for HIV or hepatitis  certain medicines for fungal infections like fluconazole, itraconazole, ketoconazole, posaconazole, or voriconazole  cimetidine  ciprofloxacin  conivaptan  cyclosporine  dronedarone  fluvoxamine  grapefruit juice  imatinib    verapamil This list may not describe all possible interactions. Give your health care provider a list of all the medicines, herbs, non-prescription drugs, or dietary supplements you use. Also tell them if you smoke, drink alcohol, or use illegal drugs. Some items may interact with your medicine. What should I watch for while using this medicine? Your condition will be monitored carefully while you are receiving this medicine. You will need important blood work done while you are taking this medicine. Call your doctor or health care professional for advice if you get a fever, chills or sore throat, or other symptoms of a cold or flu. Do not treat yourself. This drug decreases your body's ability to fight infections. Try to avoid being around people who are sick. Some  products may contain alcohol. Ask your health care professional if this medicine contains alcohol. Be sure to tell all health care professionals you are taking this medicine. Certain medicines, like metronidazole and disulfiram, can cause an unpleasant reaction when taken with alcohol. The reaction includes flushing, headache, nausea, vomiting, sweating, and increased thirst. The reaction can last from 30 minutes to several hours. You may get drowsy or dizzy. Do not drive, use machinery, or do anything that needs mental alertness until you know how this medicine affects you. Do not stand or sit up quickly, especially if you are an older patient. This reduces the risk of dizzy or fainting spells. Alcohol may interfere with the effect of this medicine. Talk to your health care professional about your risk of cancer. You may be more at risk for certain types of cancer if you take this medicine. Do not become pregnant while taking this medicine or for 6 months after stopping it. Women should inform their doctor if they wish to become pregnant or think they might be pregnant. There is a potential for serious side effects to an unborn child. Talk to your health care professional or pharmacist for more information. Do not breast-feed an infant while taking this medicine or for 1 week after stopping it. Males who get this medicine must use a condom during sex with females who can get pregnant. If you get a woman pregnant, the baby could have birth defects. The baby could die before they are born. You will need to continue wearing a condom for 3 months after stopping the medicine. Tell your health care provider right away if your partner becomes pregnant while you are taking this medicine. This may interfere with the ability to father a child. You should talk to your doctor or health care professional if you are concerned about your fertility. What side effects may I notice from receiving this medicine? Side effects  that you should report to your doctor or health care professional as soon as possible:  allergic reactions like skin rash, itching or hives, swelling of the face, lips, or tongue  blurred vision  breathing problems  changes in vision  low blood counts - This drug may decrease the number of white blood cells, red blood cells and platelets. You may be at increased risk for infections and bleeding.  nausea and vomiting  pain, redness or irritation at site where injected  pain, tingling, numbness in the hands or feet  redness, blistering, peeling, or loosening of the skin, including inside the mouth  signs of decreased platelets or bleeding - bruising, pinpoint red spots on the skin, black, tarry stools, nosebleeds  signs of decreased red blood cells - unusually weak or tired, fainting spells, lightheadedness    signs of infection - fever or chills, cough, sore throat, pain or difficulty passing urine  swelling of the ankle, feet, hands Side effects that usually do not require medical attention (report to your doctor or health care professional if they continue or are bothersome):  constipation  diarrhea  fingernail or toenail changes  hair loss  loss of appetite  mouth sores  muscle pain This list may not describe all possible side effects. Call your doctor for medical advice about side effects. You may report side effects to FDA at 1-800-FDA-1088. Where should I keep my medicine? This drug is given in a hospital or clinic and will not be stored at home. NOTE: This sheet is a summary. It may not cover all possible information. If you have questions about this medicine, talk to your doctor, pharmacist, or health care provider.  2021 Elsevier/Gold Standard (2018-12-29 19:50:31)

## 2020-06-03 NOTE — Progress Notes (Signed)
Nutrition Assessment   Reason for Assessment: Provider request   ASSESSMENT: 80 year old female with right breast cancer. She is receiving neoadjuvant chemotherapy. Patient followed by Dr. Lindi Adie.  Past medical history of HTN, stoke  Met with patient during infusion. Patient reports good appetite, denies nutrition impact symptoms. She reports 1 prior episode of nausea relieved with medication. Patient reports she eats well when visiting her sister in [facility], was there for a week recently and sees her often. Patient reports she and her daughter [name] are living with Bonnie's x-husband. Yesterday she ate bacon, egg, toast with jelly and milk for breakfast, had a candy bar and Boost Plus in the afternoon, and pintos, fried potatoes, baked beans and a glass of milk for dinner. Patient reports daughter [name]) requesting to meet with nutrition today and provided contact information.    Medications: B12, Vit D, Zofran, Compazine   Labs: Glucose 1.8, Albumin 3.1, Hgb 8.7   Anthropometrics: Patient is 10% under reported usual 135-140 lb; significant  Height: 5'5" Weight: 122 lb 9.6 oz UBW: 135-140 (per pt) BMI: 20.4    NUTRITION DIAGNOSIS: Unintended weight loss related to cancer and related treatments as evidenced by 1.6% decrease in weight over the last 3 weeks; concerning    INTERVENTION:  Met with daughter in office after she returned to campus from eating with a friend Discussed the importance of nutrition/maintaing weight during treatment Encouraged small meals with snacks in between, high calorie, high protein snack ideas provided Discussed strategies for adding calories and protein, handout provided Encouraged 2-3 Ensure supplements daily Complimentary Ensure case and coupons given today Continue taking nausea medication as needed RD contact information given Complimentary support bag given to daughter (includes blanket, puzzles, inspirational quotes,  crackers)   MONITORING, EVALUATION, GOAL: weight trends, intake   Next Visit: Friday May 13 in clinic

## 2020-06-06 ENCOUNTER — Inpatient Hospital Stay: Payer: Medicare HMO

## 2020-06-06 ENCOUNTER — Other Ambulatory Visit: Payer: Self-pay

## 2020-06-06 VITALS — BP 121/62 | HR 96 | Resp 16

## 2020-06-06 DIAGNOSIS — Z170 Estrogen receptor positive status [ER+]: Secondary | ICD-10-CM

## 2020-06-06 DIAGNOSIS — C50911 Malignant neoplasm of unspecified site of right female breast: Secondary | ICD-10-CM

## 2020-06-06 DIAGNOSIS — Z5112 Encounter for antineoplastic immunotherapy: Secondary | ICD-10-CM | POA: Diagnosis not present

## 2020-06-06 MED ORDER — PEGFILGRASTIM-CBQV 6 MG/0.6ML ~~LOC~~ SOSY
6.0000 mg | PREFILLED_SYRINGE | Freq: Once | SUBCUTANEOUS | Status: AC
  Administered 2020-06-06: 6 mg via SUBCUTANEOUS

## 2020-06-06 MED ORDER — PEGFILGRASTIM-CBQV 6 MG/0.6ML ~~LOC~~ SOSY
PREFILLED_SYRINGE | SUBCUTANEOUS | Status: AC
  Filled 2020-06-06: qty 0.6

## 2020-06-06 NOTE — Patient Instructions (Signed)
Pegfilgrastim injection What is this medicine? PEGFILGRASTIM (PEG fil gra stim) is a long-acting granulocyte colony-stimulating factor that stimulates the growth of neutrophils, a type of white blood cell important in the body's fight against infection. It is used to reduce the incidence of fever and infection in patients with certain types of cancer who are receiving chemotherapy that affects the bone marrow, and to increase survival after being exposed to high doses of radiation. This medicine may be used for other purposes; ask your health care provider or pharmacist if you have questions. COMMON BRAND NAME(S): Fulphila, Neulasta, Nyvepria, UDENYCA, Ziextenzo What should I tell my health care provider before I take this medicine? They need to know if you have any of these conditions:  kidney disease  latex allergy  ongoing radiation therapy  sickle cell disease  skin reactions to acrylic adhesives (On-Body Injector only)  an unusual or allergic reaction to pegfilgrastim, filgrastim, other medicines, foods, dyes, or preservatives  pregnant or trying to get pregnant  breast-feeding How should I use this medicine? This medicine is for injection under the skin. If you get this medicine at home, you will be taught how to prepare and give the pre-filled syringe or how to use the On-body Injector. Refer to the patient Instructions for Use for detailed instructions. Use exactly as directed. Tell your healthcare provider immediately if you suspect that the On-body Injector may not have performed as intended or if you suspect the use of the On-body Injector resulted in a missed or partial dose. It is important that you put your used needles and syringes in a special sharps container. Do not put them in a trash can. If you do not have a sharps container, call your pharmacist or healthcare provider to get one. Talk to your pediatrician regarding the use of this medicine in children. While this drug  may be prescribed for selected conditions, precautions do apply. Overdosage: If you think you have taken too much of this medicine contact a poison control center or emergency room at once. NOTE: This medicine is only for you. Do not share this medicine with others. What if I miss a dose? It is important not to miss your dose. Call your doctor or health care professional if you miss your dose. If you miss a dose due to an On-body Injector failure or leakage, a new dose should be administered as soon as possible using a single prefilled syringe for manual use. What may interact with this medicine? Interactions have not been studied. This list may not describe all possible interactions. Give your health care provider a list of all the medicines, herbs, non-prescription drugs, or dietary supplements you use. Also tell them if you smoke, drink alcohol, or use illegal drugs. Some items may interact with your medicine. What should I watch for while using this medicine? Your condition will be monitored carefully while you are receiving this medicine. You may need blood work done while you are taking this medicine. Talk to your health care provider about your risk of cancer. You may be more at risk for certain types of cancer if you take this medicine. If you are going to need a MRI, CT scan, or other procedure, tell your doctor that you are using this medicine (On-Body Injector only). What side effects may I notice from receiving this medicine? Side effects that you should report to your doctor or health care professional as soon as possible:  allergic reactions (skin rash, itching or hives, swelling of   the face, lips, or tongue)  back pain  dizziness  fever  pain, redness, or irritation at site where injected  pinpoint red spots on the skin  red or dark-brown urine  shortness of breath or breathing problems  stomach or side pain, or pain at the shoulder  swelling  tiredness  trouble  passing urine or change in the amount of urine  unusual bruising or bleeding Side effects that usually do not require medical attention (report to your doctor or health care professional if they continue or are bothersome):  bone pain  muscle pain This list may not describe all possible side effects. Call your doctor for medical advice about side effects. You may report side effects to FDA at 1-800-FDA-1088. Where should I keep my medicine? Keep out of the reach of children. If you are using this medicine at home, you will be instructed on how to store it. Throw away any unused medicine after the expiration date on the label. NOTE: This sheet is a summary. It may not cover all possible information. If you have questions about this medicine, talk to your doctor, pharmacist, or health care provider.  2021 Elsevier/Gold Standard (2019-02-20 13:20:51)  

## 2020-06-23 NOTE — Assessment & Plan Note (Signed)
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 Neoadj chemo withTaxotere Herceptin Perjeta 2. Mastectomywith sentinel node biopsy 3. Adj XRT 4. Adj Anti estrogen therapy -------------------------------------------------------------------------------------------------- Current treatment: Cycle 2 Taxotere Herceptin Perjeta Echocardiogram 04/23/2020: EF 55% Chemo toxicities: 1.  Mild nausea 2. Fatigue 3.  Anemia: Probably due to bleeding from the tumor 4.  Thrush: I sent a prescription for nystatin  Poor nutrition: I sent for boost to supplement prescription. Return to clinic in 3 weeks for cycle 3

## 2020-06-23 NOTE — Progress Notes (Signed)
Patient Care Team: Pcp, No as PCP - General Mauro Kaufmann, RN as Oncology Nurse Navigator Rockwell Germany, RN as Oncology Nurse Navigator  DIAGNOSIS:    ICD-10-CM   1. Malignant neoplasm of right breast in female, estrogen receptor positive, unspecified site of breast ([place])  C50.911    Z17.0     SUMMARY OF ONCOLOGIC HISTORY: Oncology History  Malignant neoplasm of right breast in female, estrogen receptor positive ([place])  04/18/2020 Initial Diagnosis   Patient presented to the ED with complaint of syncope and found to have a fungating right breast mass. CT showed a right breast mass, 8.1cm, and right axillary adenopathy. Patient reported a right breast mass present for past 2-3 years. Biopsy showed IDC, grade 3, HER-2 equivocal by IHC, positive by FISH (ratio 3.56), ER+ 95%, PR+ 70%, ,Ki67 25%.    04/18/2020 Cancer Staging   Staging form: Breast, AJCC 8th Edition - Clinical stage from 04/18/2020: Stage IIIB (cT4b, cN1, cM0, G3, ER+, PR+, HER2+) - Signed by Gardenia Phlegm, NP on 04/27/2020 Stage prefix: Initial diagnosis Histologic grading system: 3 grade system   05/13/2020 -  Chemotherapy    Patient is on Treatment Plan: BREAST DOCETAXEL + TRASTUZUMAB + PERTUZUMAB (THP) Q21D        CHIEF COMPLIANT: Cycle 3 TCHP  INTERVAL HISTORY: Elizabeth Chase is a 80 y.o. with above-mentioned history of HER-2 positive right breast cancer currently on neoadjuvant chemotherapy with Taxotere Herceptin Perjeta. She presents to the clinic today for cycle 3.    She tolerated her chemo well. Her breast tumor is getting better.  ALLERGIES:  is allergic to bee venom.  MEDICATIONS:  Current Outpatient Medications  Medication Sig Dispense Refill  . albuterol (VENTOLIN HFA) 108 (90 Base) MCG/ACT inhaler Inhale 2 puffs into the lungs every 6 (six) hours as needed for wheezing or shortness of breath. 8 g 2  . ALPRAZolam (XANAX) 0.25 MG tablet Take 0.25 mg by mouth at bedtime as needed for  anxiety.    Marland Kitchen aspirin EC 325 MG tablet Take 325 mg by mouth daily.    Marland Kitchen atorvastatin (LIPITOR) 20 MG tablet Take 1 tablet (20 mg total) by mouth daily at 6 PM. 30 tablet 0  . cetirizine (ZYRTEC) 10 MG tablet Take 10 mg daily by mouth.    . cyanocobalamin 1000 MCG tablet Take 1,000 mcg by mouth daily.    . ergocalciferol (VITAMIN D2) 1.25 MG (50000 UT) capsule Take 50,000 Units by mouth once a week.    . feeding supplement (BOOST HIGH PROTEIN) LIQD Take 237 mLs by mouth 3 (three) times daily between meals. Please give her 90 bottles 237 mL 6  . ferrous gluconate (FERGON) 324 MG tablet Take 324 mg by mouth daily with breakfast.    . fluticasone furoate-vilanterol (BREO ELLIPTA) 200-25 MCG/INH AEPB Inhale 1 puff into the lungs daily.    Marland Kitchen lidocaine-prilocaine (EMLA) cream Apply to affected area once 30 g 3  . lisinopril (ZESTRIL) 40 MG tablet Take 40 mg by mouth daily.    Marland Kitchen nystatin (MYCOSTATIN) 100000 UNIT/ML suspension Take 5 mLs (500,000 Units total) by mouth 4 (four) times daily. 60 mL 0  . nystatin (NYSTATIN) powder Apply 1 application topically 3 (three) times daily.    . ondansetron (ZOFRAN) 8 MG tablet Take 1 tablet (8 mg total) by mouth 2 (two) times daily as needed for refractory nausea / vomiting. 30 tablet 1  . prochlorperazine (COMPAZINE) 10 MG tablet Take 1 tablet (10  mg total) by mouth every 6 (six) hours as needed (Nausea or vomiting). 30 tablet 1  . trazodone (DESYREL) 300 MG tablet Take 300 mg by mouth at bedtime.     No current facility-administered medications for this visit.    PHYSICAL EXAMINATION: ECOG PERFORMANCE STATUS: 1 - Symptomatic but completely ambulatory  Vitals:   06/24/20 1114  BP: (!) 147/59  Pulse: 70  Resp: 16  Temp: (!) 97.3 F (36.3 C)  SpO2: 95%   Filed Weights   06/24/20 1114  Weight: 123 lb 3.2 oz (55.9 kg)    LABORATORY DATA:  I have reviewed the data as listed CMP Latest Ref Rng & Units 06/24/2020 06/03/2020 05/20/2020  Glucose 70 - 99  mg/dL 93 108(H) 95  BUN 8 - 23 mg/dL _0 Creatinine 0.44 - 1.00 mg/dL 0.76 0.64 0.79  Sodium 135 - 145 mmol/L 140 140 139  Potassium 3.5 - 5.1 mmol/L 4.1 4.1 4.1  Chloride 98 - 111 mmol/L 107 107 104  CO2 22 - 32 mmol/L _1 Calcium 8.9 - 10.3 mg/dL 8.8(L) 8.3(L) 8.8(L)  Total Protein 6.5 - 8.1 g/dL 6.6 6.3(L) 6.4(L)  Total Bilirubin 0.3 - 1.2 mg/dL 0.3 <0.2(L) 0.4  Alkaline Phos 38 - 126 U/L 67 54 119  AST 15 - 41 U/L 14(L) 13(L) 18  ALT 0 - 44 U/L _2 Lab Results  Component Value Date   WBC 6.5 06/24/2020   HGB 9.3 (L) 06/24/2020   HCT 29.8 (L) 06/24/2020   MCV 87.1 06/24/2020   PLT 370 06/24/2020   NEUTROABS 4.6 06/24/2020    ASSESSMENT & PLAN:  Malignant neoplasm of right breast in female, estrogen receptor positive ([facility]) Stage IIIc fungating tumor of the right breast: ER/PR and HER-2 positive 04/15/20: CT CAP: No distant mets  Treatment Plan: 1. Neoadj chemo withTaxotere Herceptin Perjeta 2. Mastectomywith sentinel node biopsy 3. Adj XRT 4. Adj Anti estrogen therapy -------------------------------------------------------------------------------------------------- Current treatment: Cycle 2 Taxotere Herceptin Perjeta  Echocardiogram 04/23/2020: EF 55% Chemo toxicities: 1.  Mild nausea 2. Fatigue 3.  Anemia: Probably due to bleeding from the tumor, stable 4.  Thrush: resolved  Poor nutrition: I sent for boost to supplement prescription. Return to clinic in 3 weeks for cycle 3    No orders of the defined types were placed in this encounter.  The patient has a good understanding of the overall plan. she agrees with it. she will call with any problems that may develop before the next visit here.  Total time spent: 30 mins including face to face time and time spent for planning, charting and coordination of care  Rulon Eisenmenger, MD, MPH 06/24/2020  I, Molly Dorshimer, am acting as scribe for Dr. Nicholas Lose.  I have reviewed the above  documentation for accuracy and completeness, and I agree with the above.

## 2020-06-24 ENCOUNTER — Encounter: Payer: Self-pay | Admitting: Licensed Clinical Social Worker

## 2020-06-24 ENCOUNTER — Inpatient Hospital Stay (HOSPITAL_BASED_OUTPATIENT_CLINIC_OR_DEPARTMENT_OTHER): Payer: Medicare HMO | Admitting: Hematology and Oncology

## 2020-06-24 ENCOUNTER — Inpatient Hospital Stay: Payer: Medicare HMO | Admitting: Dietician

## 2020-06-24 ENCOUNTER — Encounter: Payer: Self-pay | Admitting: Hematology and Oncology

## 2020-06-24 ENCOUNTER — Inpatient Hospital Stay: Payer: Medicare HMO | Attending: Hematology and Oncology

## 2020-06-24 ENCOUNTER — Other Ambulatory Visit: Payer: Self-pay

## 2020-06-24 ENCOUNTER — Inpatient Hospital Stay: Payer: Medicare HMO

## 2020-06-24 ENCOUNTER — Other Ambulatory Visit: Payer: Medicare HMO

## 2020-06-24 ENCOUNTER — Encounter: Payer: Self-pay | Admitting: *Deleted

## 2020-06-24 DIAGNOSIS — C50911 Malignant neoplasm of unspecified site of right female breast: Secondary | ICD-10-CM

## 2020-06-24 DIAGNOSIS — Z5111 Encounter for antineoplastic chemotherapy: Secondary | ICD-10-CM | POA: Insufficient documentation

## 2020-06-24 DIAGNOSIS — Z5112 Encounter for antineoplastic immunotherapy: Secondary | ICD-10-CM | POA: Diagnosis not present

## 2020-06-24 DIAGNOSIS — Z170 Estrogen receptor positive status [ER+]: Secondary | ICD-10-CM | POA: Diagnosis not present

## 2020-06-24 DIAGNOSIS — Z5189 Encounter for other specified aftercare: Secondary | ICD-10-CM | POA: Insufficient documentation

## 2020-06-24 DIAGNOSIS — Z95828 Presence of other vascular implants and grafts: Secondary | ICD-10-CM

## 2020-06-24 LAB — CBC WITH DIFFERENTIAL (CANCER CENTER ONLY)
Abs Immature Granulocytes: 0.02 10*3/uL (ref 0.00–0.07)
Basophils Absolute: 0.1 10*3/uL (ref 0.0–0.1)
Basophils Relative: 1 %
Eosinophils Absolute: 0.1 10*3/uL (ref 0.0–0.5)
Eosinophils Relative: 1 %
HCT: 29.8 % — ABNORMAL LOW (ref 36.0–46.0)
Hemoglobin: 9.3 g/dL — ABNORMAL LOW (ref 12.0–15.0)
Immature Granulocytes: 0 %
Lymphocytes Relative: 19 %
Lymphs Abs: 1.2 10*3/uL (ref 0.7–4.0)
MCH: 27.2 pg (ref 26.0–34.0)
MCHC: 31.2 g/dL (ref 30.0–36.0)
MCV: 87.1 fL (ref 80.0–100.0)
Monocytes Absolute: 0.5 10*3/uL (ref 0.1–1.0)
Monocytes Relative: 8 %
Neutro Abs: 4.6 10*3/uL (ref 1.7–7.7)
Neutrophils Relative %: 71 %
Platelet Count: 370 10*3/uL (ref 150–400)
RBC: 3.42 MIL/uL — ABNORMAL LOW (ref 3.87–5.11)
RDW: 15.2 % (ref 11.5–15.5)
WBC Count: 6.5 10*3/uL (ref 4.0–10.5)
nRBC: 0 % (ref 0.0–0.2)

## 2020-06-24 LAB — CMP (CANCER CENTER ONLY)
ALT: 7 U/L (ref 0–44)
AST: 14 U/L — ABNORMAL LOW (ref 15–41)
Albumin: 3.3 g/dL — ABNORMAL LOW (ref 3.5–5.0)
Alkaline Phosphatase: 67 U/L (ref 38–126)
Anion gap: 5 (ref 5–15)
BUN: 16 mg/dL (ref 8–23)
CO2: 28 mmol/L (ref 22–32)
Calcium: 8.8 mg/dL — ABNORMAL LOW (ref 8.9–10.3)
Chloride: 107 mmol/L (ref 98–111)
Creatinine: 0.76 mg/dL (ref 0.44–1.00)
GFR, Estimated: >60 mL/min (ref >60)
Glucose, Bld: 93 mg/dL (ref 70–99)
Potassium: 4.1 mmol/L (ref 3.5–5.1)
Sodium: 140 mmol/L (ref 135–145)
Total Bilirubin: 0.3 mg/dL (ref 0.3–1.2)
Total Protein: 6.6 g/dL (ref 6.5–8.1)

## 2020-06-24 MED ORDER — SODIUM CHLORIDE 0.9% FLUSH
10.0000 mL | Freq: Once | INTRAVENOUS | Status: AC
  Administered 2020-06-24: 10 mL
  Filled 2020-06-24: qty 10

## 2020-06-24 MED ORDER — SODIUM CHLORIDE 0.9 % IV SOLN
420.0000 mg | Freq: Once | INTRAVENOUS | Status: AC
  Administered 2020-06-24: 420 mg via INTRAVENOUS
  Filled 2020-06-24: qty 14

## 2020-06-24 MED ORDER — DIPHENHYDRAMINE HCL 25 MG PO CAPS
ORAL_CAPSULE | ORAL | Status: AC
  Filled 2020-06-24: qty 1

## 2020-06-24 MED ORDER — HEPARIN SOD (PORK) LOCK FLUSH 100 UNIT/ML IV SOLN
500.0000 [IU] | Freq: Once | INTRAVENOUS | Status: AC | PRN
Start: 2020-06-24 — End: 2020-06-24
  Administered 2020-06-24: 500 [IU]
  Filled 2020-06-24: qty 5

## 2020-06-24 MED ORDER — SODIUM CHLORIDE 0.9 % IV SOLN
Freq: Once | INTRAVENOUS | Status: AC
  Filled 2020-06-24: qty 250

## 2020-06-24 MED ORDER — DIPHENHYDRAMINE HCL 25 MG PO CAPS
25.0000 mg | ORAL_CAPSULE | Freq: Once | ORAL | Status: AC
  Administered 2020-06-24: 25 mg via ORAL

## 2020-06-24 MED ORDER — SODIUM CHLORIDE 0.9 % IV SOLN
10.0000 mg | Freq: Once | INTRAVENOUS | Status: AC
  Administered 2020-06-24: 10 mg via INTRAVENOUS
  Filled 2020-06-24: qty 10

## 2020-06-24 MED ORDER — ACETAMINOPHEN 325 MG PO TABS
ORAL_TABLET | ORAL | Status: AC
  Filled 2020-06-24: qty 2

## 2020-06-24 MED ORDER — TRASTUZUMAB-ANNS CHEMO 150 MG IV SOLR
6.0000 mg/kg | Freq: Once | INTRAVENOUS | Status: AC
  Administered 2020-06-24: 336 mg via INTRAVENOUS
  Filled 2020-06-24: qty 16

## 2020-06-24 MED ORDER — SODIUM CHLORIDE 0.9 % IV SOLN
50.0000 mg/m2 | Freq: Once | INTRAVENOUS | Status: AC
  Administered 2020-06-24: 80 mg via INTRAVENOUS
  Filled 2020-06-24: qty 8

## 2020-06-24 MED ORDER — ACETAMINOPHEN 325 MG PO TABS
650.0000 mg | ORAL_TABLET | Freq: Once | ORAL | Status: AC
  Administered 2020-06-24: 650 mg via ORAL

## 2020-06-24 MED ORDER — SODIUM CHLORIDE 0.9% FLUSH
10.0000 mL | INTRAVENOUS | Status: DC | PRN
  Administered 2020-06-24: 10 mL
  Filled 2020-06-24: qty 10

## 2020-06-24 NOTE — Progress Notes (Signed)
[facility] CSW Progress Note  Holiday representative met with patient and daughter to provide 3rd set of [employer]. Daughter also stated that they did receive the check from [facility] yesterday which she hopes to use toward getting another car.  No other social work needs at this time.    Christeen Douglas , LCSW

## 2020-06-24 NOTE — Progress Notes (Signed)
Nutrition Follow-up:  Patient with right breast cancer. She is receiving neoadjuvant chemotherapy with Taxotere/herceptin/Perjeta.  Spoke with daughter [name]) via telephone. She reports patient is doing "really well." Patient's appetite is "awesome" and has been eating  spaghetti, cubed steak, meatloaf. [name] reports they went to [employer] in [place] for dinner last night, patient ate almost all of meal. [name] reports patient has had 2 episodes of diarrhea and a single episode of vomiting that occurred a couple weeks ago. [name] reports patient is drinking 3 Ensure daily. She requested another sample case today at check-in.    Medications: reviewed  Labs: reviewed  Anthropometrics: Weight 123 lb 3.2 lbs today stable  4/22 - 122 lb 9.6 oz 4/8 - 123 lb 6.4 oz 4/1 - 124 lb 4.8 oz 3/4 - 121 lb  NUTRITION DIAGNOSIS: Unintended weight loss stable    INTERVENTION:  Encouraged high protein snacks in between meals, daughter has handout with snack ideas Continue drinking 3 Ensure Plus daily  Complimentary case of Ensure Plus provided today  MD has written Boost prescription  Nausea medication as needed Discussed strategies for diarrhea, foods best tolerated/foods to avoid, importance of hydration    MONITORING, EVALUATION, GOAL: weight trends, intake   NEXT VISIT: f/u in 3 weeks - to be scheduled with treatment

## 2020-06-24 NOTE — Patient Instructions (Signed)
[facility]  Discharge Instructions: Thank you for choosing [facility] to provide your oncology and hematology care.   If you have a lab appointment with the [facility], please go directly to the [facility] and check in at the registration area.   Wear comfortable clothing and clothing appropriate for easy access to any Portacath or PICC line.   We strive to give you quality time with your provider. You may need to reschedule your appointment if you arrive late (15 or more minutes).  Arriving late affects you and other patients whose appointments are after yours.  Also, if you miss three or more appointments without notifying the office, you may be dismissed from the clinic at the provider's discretion.      For prescription refill requests, have your pharmacy contact our office and allow 72 hours for refills to be completed.    Today you received the following chemotherapy and/or immunotherapy agents: trastuzumab, pertuzumab, and docetaxel.      To help prevent nausea and vomiting after your treatment, we encourage you to take your nausea medication as directed.  BELOW ARE SYMPTOMS THAT SHOULD BE REPORTED IMMEDIATELY: . *FEVER GREATER THAN 100.4 F (38 C) OR HIGHER . *CHILLS OR SWEATING . *NAUSEA AND VOMITING THAT IS NOT CONTROLLED WITH YOUR NAUSEA MEDICATION . *UNUSUAL SHORTNESS OF BREATH . *UNUSUAL BRUISING OR BLEEDING . *URINARY PROBLEMS (pain or burning when urinating, or frequent urination) . *BOWEL PROBLEMS (unusual diarrhea, constipation, pain near the anus) . TENDERNESS IN MOUTH AND THROAT WITH OR WITHOUT PRESENCE OF ULCERS (sore throat, sores in mouth, or a toothache) . UNUSUAL RASH, SWELLING OR PAIN  . UNUSUAL VAGINAL DISCHARGE OR ITCHING   Items with * indicate a potential emergency and should be followed up as soon as possible or go to the Emergency Department if any problems should occur.  Please show the CHEMOTHERAPY  ALERT CARD or IMMUNOTHERAPY ALERT CARD at check-in to the Emergency Department and triage nurse.  Should you have questions after your visit or need to cancel or reschedule your appointment, please contact [facility]  Dept: [phone]  and follow the prompts.  Office hours are 8:00 a.m. to 4:30 p.m. Monday - Friday. Please note that voicemails left after 4:00 p.m. may not be returned until the following business day.  We are closed weekends and major holidays. You have access to a nurse at all times for urgent questions. Please call the main number to the clinic Dept: [phone] and follow the prompts.   For any non-urgent questions, you may also contact your provider using MyChart. We now offer e-Visits for anyone 79 and older to request care online for non-urgent symptoms. For details visit [email or web address].   Also download the MyChart app! Go to the app store, search "MyChart", open the app, select [facility], and log in with your MyChart username and password.  Due to Covid, a mask is required upon entering the hospital/clinic. If you do not have a mask, one will be given to you upon arrival. For doctor visits, patients may have 1 support person aged 33 or older with them. For treatment visits, patients cannot have anyone with them due to current Covid guidelines and our immunocompromised population.

## 2020-06-24 NOTE — Progress Notes (Signed)
Pt is approved for the $1000 [employer] grant.  

## 2020-06-27 ENCOUNTER — Other Ambulatory Visit: Payer: Self-pay

## 2020-06-27 ENCOUNTER — Inpatient Hospital Stay: Payer: Medicare HMO

## 2020-06-27 ENCOUNTER — Other Ambulatory Visit: Payer: Self-pay | Admitting: Hematology and Oncology

## 2020-06-27 VITALS — BP 147/99 | HR 94 | Resp 18

## 2020-06-27 DIAGNOSIS — Z5112 Encounter for antineoplastic immunotherapy: Secondary | ICD-10-CM | POA: Diagnosis not present

## 2020-06-27 DIAGNOSIS — Z5111 Encounter for antineoplastic chemotherapy: Secondary | ICD-10-CM | POA: Diagnosis not present

## 2020-06-27 DIAGNOSIS — C50911 Malignant neoplasm of unspecified site of right female breast: Secondary | ICD-10-CM | POA: Diagnosis not present

## 2020-06-27 DIAGNOSIS — Z170 Estrogen receptor positive status [ER+]: Secondary | ICD-10-CM | POA: Diagnosis not present

## 2020-06-27 DIAGNOSIS — Z5189 Encounter for other specified aftercare: Secondary | ICD-10-CM | POA: Diagnosis not present

## 2020-06-27 MED ORDER — PEGFILGRASTIM-CBQV 6 MG/0.6ML ~~LOC~~ SOSY
6.0000 mg | PREFILLED_SYRINGE | Freq: Once | SUBCUTANEOUS | Status: AC
  Administered 2020-06-27: 6 mg via SUBCUTANEOUS

## 2020-06-27 MED ORDER — PEGFILGRASTIM-CBQV 6 MG/0.6ML ~~LOC~~ SOSY
PREFILLED_SYRINGE | SUBCUTANEOUS | Status: AC
  Filled 2020-06-27: qty 0.6

## 2020-06-27 NOTE — Patient Instructions (Signed)
Pegfilgrastim injection What is this medicine? PEGFILGRASTIM (PEG fil gra stim) is a long-acting granulocyte colony-stimulating factor that stimulates the growth of neutrophils, a type of white blood cell important in the body's fight against infection. It is used to reduce the incidence of fever and infection in patients with certain types of cancer who are receiving chemotherapy that affects the bone marrow, and to increase survival after being exposed to high doses of radiation. This medicine may be used for other purposes; ask your health care provider or pharmacist if you have questions. COMMON BRAND NAME(S): Fulphila, Neulasta, Nyvepria, UDENYCA, Ziextenzo What should I tell my health care provider before I take this medicine? They need to know if you have any of these conditions:  kidney disease  latex allergy  ongoing radiation therapy  sickle cell disease  skin reactions to acrylic adhesives (On-Body Injector only)  an unusual or allergic reaction to pegfilgrastim, filgrastim, other medicines, foods, dyes, or preservatives  pregnant or trying to get pregnant  breast-feeding How should I use this medicine? This medicine is for injection under the skin. If you get this medicine at home, you will be taught how to prepare and give the pre-filled syringe or how to use the On-body Injector. Refer to the patient Instructions for Use for detailed instructions. Use exactly as directed. Tell your healthcare provider immediately if you suspect that the On-body Injector may not have performed as intended or if you suspect the use of the On-body Injector resulted in a missed or partial dose. It is important that you put your used needles and syringes in a special sharps container. Do not put them in a trash can. If you do not have a sharps container, call your pharmacist or healthcare provider to get one. Talk to your pediatrician regarding the use of this medicine in children. While this drug  may be prescribed for selected conditions, precautions do apply. Overdosage: If you think you have taken too much of this medicine contact a poison control center or emergency room at once. NOTE: This medicine is only for you. Do not share this medicine with others. What if I miss a dose? It is important not to miss your dose. Call your doctor or health care professional if you miss your dose. If you miss a dose due to an On-body Injector failure or leakage, a new dose should be administered as soon as possible using a single prefilled syringe for manual use. What may interact with this medicine? Interactions have not been studied. This list may not describe all possible interactions. Give your health care provider a list of all the medicines, herbs, non-prescription drugs, or dietary supplements you use. Also tell them if you smoke, drink alcohol, or use illegal drugs. Some items may interact with your medicine. What should I watch for while using this medicine? Your condition will be monitored carefully while you are receiving this medicine. You may need blood work done while you are taking this medicine. Talk to your health care provider about your risk of cancer. You may be more at risk for certain types of cancer if you take this medicine. If you are going to need a MRI, CT scan, or other procedure, tell your doctor that you are using this medicine (On-Body Injector only). What side effects may I notice from receiving this medicine? Side effects that you should report to your doctor or health care professional as soon as possible:  allergic reactions (skin rash, itching or hives, swelling of   the face, lips, or tongue)  back pain  dizziness  fever  pain, redness, or irritation at site where injected  pinpoint red spots on the skin  red or dark-brown urine  shortness of breath or breathing problems  stomach or side pain, or pain at the shoulder  swelling  tiredness  trouble  passing urine or change in the amount of urine  unusual bruising or bleeding Side effects that usually do not require medical attention (report to your doctor or health care professional if they continue or are bothersome):  bone pain  muscle pain This list may not describe all possible side effects. Call your doctor for medical advice about side effects. You may report side effects to FDA at 1-800-FDA-1088. Where should I keep my medicine? Keep out of the reach of children. If you are using this medicine at home, you will be instructed on how to store it. Throw away any unused medicine after the expiration date on the label. NOTE: This sheet is a summary. It may not cover all possible information. If you have questions about this medicine, talk to your doctor, pharmacist, or health care provider.  2021 Elsevier/Gold Standard (2019-02-20 13:20:51)  

## 2020-06-29 ENCOUNTER — Telehealth: Payer: Self-pay | Admitting: Hematology and Oncology

## 2020-06-29 NOTE — Telephone Encounter (Signed)
Received call form pts daughter. Confirmed 6/3,6/4, and 6/6 appts

## 2020-06-30 ENCOUNTER — Encounter: Payer: Self-pay | Admitting: *Deleted

## 2020-06-30 NOTE — Progress Notes (Signed)
RN successfully faxed signed order, certificate of medical necessity, and recent office notes to [facility] [phone])  for the distrubution of incontinence products.

## 2020-07-01 DIAGNOSIS — R32 Unspecified urinary incontinence: Secondary | ICD-10-CM | POA: Diagnosis not present

## 2020-07-05 ENCOUNTER — Encounter: Payer: Self-pay | Admitting: Interventional Cardiology

## 2020-07-05 ENCOUNTER — Other Ambulatory Visit: Payer: Self-pay

## 2020-07-05 ENCOUNTER — Ambulatory Visit (INDEPENDENT_AMBULATORY_CARE_PROVIDER_SITE_OTHER): Payer: Medicare HMO | Admitting: Interventional Cardiology

## 2020-07-05 VITALS — BP 122/56 | HR 83 | Ht 65.5 in | Wt 121.4 lb

## 2020-07-05 DIAGNOSIS — E782 Mixed hyperlipidemia: Secondary | ICD-10-CM

## 2020-07-05 DIAGNOSIS — I10 Essential (primary) hypertension: Secondary | ICD-10-CM

## 2020-07-05 DIAGNOSIS — R011 Cardiac murmur, unspecified: Secondary | ICD-10-CM

## 2020-07-05 DIAGNOSIS — R55 Syncope and collapse: Secondary | ICD-10-CM | POA: Diagnosis not present

## 2020-07-05 NOTE — Patient Instructions (Addendum)
Medication Instructions:  Your physician recommends that you continue on your current medications as directed. Please refer to the Current Medication list given to you today.  *If you need a refill on your cardiac medications before your next appointment, please call your pharmacy*   Lab Work: none If you have labs (blood work) drawn today and your tests are completely normal, you will receive your results only by: Marland Kitchen MyChart Message (if you have MyChart) OR . A paper copy in the mail If you have any lab test that is abnormal or we need to change your treatment, we will call you to review the results.   Testing/Procedures: Your physician has requested that you have an echocardiogram. Echocardiography is a painless test that uses sound waves to create images of your heart. It provides your doctor with information about the size and shape of your heart and how well your heart's chambers and valves are working. This procedure takes approximately one hour. There are no restrictions for this procedure.     Follow-Up: At [facility], you and your health needs are our priority.  As part of our continuing mission to provide you with exceptional heart care, we have created designated Provider Care Teams.  These Care Teams include your primary Cardiologist (physician) and Advanced Practice Providers (APPs -  Physician Assistants and Nurse Practitioners) who all work together to provide you with the care you need, when you need it.  We recommend signing up for the patient portal called "MyChart".  Sign up information is provided on this After Visit Summary.  MyChart is used to connect with patients for Virtual Visits (Telemedicine).  Patients are able to view lab/test results, encounter notes, upcoming appointments, etc.  Non-urgent messages can be sent to your provider as well.   To learn more about what you can do with MyChart, go to [email or web address].    Your next appointment:   As  needed  The format for your next appointment:   In Person  Provider:   You may see Larae Grooms, MD or one of the following Advanced Practice Providers on your designated Care Team:    Melina Copa, PA-C  Ermalinda Barrios, PA-C    Other Instructions Let us know if you are taking lisinopril

## 2020-07-05 NOTE — Progress Notes (Signed)
Cardiology Office Note   Date:  07/05/2020   ID:  Elizabeth Chase, DOB 12-08-1940, MRN [id number]  PCP:  Pcp, No    No chief complaint on file.  murmur  Wt Readings from Last 3 Encounters:  07/05/20 121 lb 6.4 oz (55.1 kg)  06/24/20 123 lb 3.2 oz (55.9 kg)  06/03/20 122 lb 9.6 oz (55.6 kg)       History of Present Illness: Elizabeth Chase is a 80 y.o. female  With a h/o breast cancer.  She had COVID in 04/2020.   04/2020 echo showed: "Focused echo perfomed in setting of COVID + status.  2. Left ventricular ejection fraction, by estimation, is 55%. The left  ventricle has normal function. Left ventricular diastolic parameters are  consistent with Grade I diastolic dysfunction (impaired relaxation).  3. Right ventricular systolic function is normal. The right ventricular  size is normal.  4. Aortic valve regurgitation is trivial.  5. The inferior vena cava is normal in size with greater than 50%  respiratory variability, suggesting right atrial pressure of 3 mmHg. "  Hospitalized in 04/2020: "Syncope due to hypotension.  - Caused by dehydration and possible mild colitis. Currently no diarrhea, has been hydrated with IV fluids and is feeling better already, with good oral intake, no further dizziness or lightheadedness, lisinopril has been discontinued on discharge as blood pressure has been acceptable off medications .  colitis.  -resolved received with IV Flagyl and cephalosporin initially, currently off antibiotics  Breast cancer with fungating right breast mass with right axillary lymphadenopathy.  -History significant for invasive ductal carcinoma. -I have discussed with oncology, patient will need chemotherapy, then followed by surgery and radiation, . -2D echo was obtained per oncology recommendation, IR consulted, port catheter placed Feliz/15/2021.    Left wrist swelling appears to be a hematoma at the site of IV. IV removed, stable X-Ray.    Resolved  Fully vaccinated with incidental Covid infection.  -Treated with 3 days of remdesivir.  Elevated D-dimer.  - CTA lungs is negative. Venous Dopplers is negative for DVT.  Poor social situation. Homeless.  seen by Education officer, museum, have discussed with the patient, reports she will be able to go today with her daughter."  Since leaving the hospital, Denies : Chest pain. Dizziness. Leg edema. Nitroglycerin use. Orthopnea. Palpitations. Paroxysmal nocturnal dyspnea. Syncope.   Started chemo and she is tolerating this well.  Has some DOE.   No early CAD in the family.   Past Medical History:  Diagnosis Date  . Abnormal brain MRI   . Abnormal glucose level   . AKI (acute kidney injury) ([facility]) 04/15/2020  . Anemia   . Blood pressure abnormally low   . Breast wound 05/13/2020  . Colitis 04/15/2020  . COVID-19 virus infection 04/15/2020  . Essential hypertension   . Homelessness 05/13/2020  . Hyperlipidemia   . Hypertension   . Hypocalcemia   . Malignant neoplasm of right breast in female, estrogen receptor positive ([facility])   . Muscle weakness   . Neoplasm of breast, female, malignant ([facility])   . Port-A-Cath in place 05/13/2020  . Prolonged QT interval 04/15/2020  . Right thalamic infarction ([facility]) 01/01/2017  . Stroke ([facility])   . Syncope and collapse 04/15/2020  . Systolic murmur     Past Surgical History:  Procedure Laterality Date  . IR IMAGING GUIDED PORT INSERTION  04/26/2020  . IR US GUIDE VASC ACCESS RIGHT  04/26/2020     Current Outpatient  Medications  Medication Sig Dispense Refill  . albuterol (VENTOLIN HFA) 108 (90 Base) MCG/ACT inhaler Inhale 2 puffs into the lungs every 6 (six) hours as needed for wheezing or shortness of breath. 8 g 2  . atorvastatin (LIPITOR) 20 MG tablet Take 1 tablet (20 mg total) by mouth daily at 6 PM. 30 tablet 0  . cyanocobalamin 1000 MCG tablet Take 1,000 mcg by mouth daily.    . ergocalciferol (VITAMIN D2) 1.25 MG (50000 UT) capsule Take  50,000 Units by mouth once a week.    . feeding supplement (BOOST HIGH PROTEIN) LIQD Take 237 mLs by mouth 3 (three) times daily between meals. Please give her 90 bottles 237 mL 6  . ferrous gluconate (FERGON) 324 MG tablet Take 324 mg by mouth daily with breakfast.    . lidocaine-prilocaine (EMLA) cream Apply to affected area once 30 g 3  . lisinopril (ZESTRIL) 40 MG tablet Take 40 mg by mouth daily.    Marland Kitchen loratadine (CLARITIN) 10 MG tablet Take 10 mg by mouth daily.    Marland Kitchen nystatin (MYCOSTATIN) 100000 UNIT/ML suspension TAKE 5 MLS (500,000 UNITS TOTAL) BY MOUTH 4 (FOUR) TIMES DAILY. 60 mL 0  . nystatin (MYCOSTATIN/NYSTOP) powder Apply 1 application topically 3 (three) times daily.    . ondansetron (ZOFRAN) 8 MG tablet Take 1 tablet (8 mg total) by mouth 2 (two) times daily as needed for refractory nausea / vomiting. 30 tablet 1  . prochlorperazine (COMPAZINE) 10 MG tablet Take 1 tablet (10 mg total) by mouth every 6 (six) hours as needed (Nausea or vomiting). 30 tablet 1  . trazodone (DESYREL) 300 MG tablet Take 300 mg by mouth at bedtime.     No current facility-administered medications for this visit.    Allergies:   Bee venom    Social History:  The patient  reports that she quit smoking about 3 years ago. Her smoking use included cigarettes. She smoked 1.00 pack per day. She has never used smokeless tobacco. She reports that she does not drink alcohol and does not use drugs.   Family History:  The patient's family history includes Breast cancer in her sister; Cancer in her brother and father; Diabetes in her brother, mother, and sister; Lung cancer in her father and son.    ROS:  Please see the history of present illness.   Otherwise, review of systems are positive for improved nutrition and better living situation compared to prior.   All other systems are reviewed and negative.    PHYSICAL EXAM: VS:  BP (!) 122/56   Pulse 83   Ht 5' 5.5" (1.664 m)   Wt 121 lb 6.4 oz (55.1 kg)    BMI 19.89 kg/m  , BMI Body mass index is 19.89 kg/m. GEN: Well nourished, well developed, in no acute distress  HEENT: normal  Neck: no JVD, carotid bruits, or masses Cardiac:RRR; no murmurs, rubs, or gallops,no edema  Respiratory:  clear to auscultation bilaterally, normal work of breathing GI: soft, nontender, nondistended, + BS MS: no deformity or atrophy  Skin: warm and dry, no rash Neuro:  Strength and sensation are intact Psych: euthymic mood, full affect   EKG:   The ekg ordered today demonstrates NSR, no ST changes   Recent Labs: 04/15/2020: TSH 0.779 04/20/2020: B Natriuretic Peptide 35.4; Magnesium 1.8 06/24/2020: ALT 7; BUN 16; Creatinine 0.76; Hemoglobin 9.3; Platelet Count 370; Potassium 4.1; Sodium 140   Lipid Panel    Component Value Date/Time   CHOL  159 01/02/2017 0228   TRIG 109 01/02/2017 0228   HDL 45 01/02/2017 0228   CHOLHDL 3.5 01/02/2017 0228   VLDL 22 01/02/2017 0228   LDLCALC 92 01/02/2017 0228     Other studies Reviewed: Additional studies/ records that were reviewed today with results demonstrating: no recent lipids*.   ASSESSMENT AND PLAN:  1. Syncope/murmur: I personally reviewed the limited echocardiogram.  The aortic valve morphology could not be seen well.  We will plan for full echocardiogram now that she is more medically stable.  Ultimately, she will need surgery for her breast cancer so knowing her cardiac anatomy will be helpful. 2. Hypertension: She states she has been off of her antihypertensives since being in the hospital.  We will verify whether or not she is actually taking the lisinopril which is listed on her medication list. 3. Hyperlipidemia: Continue atorvastatin.   Current medicines are reviewed at length with the patient today.  The patient concerns regarding her medicines were addressed.  The following changes have been made:  No change  Labs/ tests ordered today include:  No orders of the defined types were placed in  this encounter.   Recommend 150 minutes/week of aerobic exercise Low fat, low carb, high fiber diet recommended  Disposition:   FU in for echo   Signed, Larae Grooms, MD  07/05/2020 10:45 AM    [address], [place], [place]  [address] Phone: [phone]; Fax: [phone]

## 2020-07-14 NOTE — Progress Notes (Signed)
Patient Care Team: Pcp, No as PCP - General Corky Crafts, MD as PCP - Cardiology (Cardiology) Pershing Proud, RN as Oncology Nurse Navigator Donnelly Angelica, RN as Oncology Nurse Navigator  DIAGNOSIS:    ICD-10-CM   1. Malignant neoplasm of right breast in female, estrogen receptor positive, unspecified site of breast (HCC)  C50.911    Z17.0     SUMMARY OF ONCOLOGIC HISTORY: Oncology History  Malignant neoplasm of right breast in female, estrogen receptor positive (HCC)  04/18/2020 Initial Diagnosis   Patient presented to the ED with complaint of syncope and found to have a fungating right breast mass. CT showed a right breast mass, 8.1cm, and right axillary adenopathy. Patient reported a right breast mass present for past 2-3 years. Biopsy showed IDC, grade 3, HER-2 equivocal by IHC, positive by FISH (ratio 3.56), ER+ 95%, PR+ 70%, ,Ki67 25%.    04/18/2020 Cancer Staging   Staging form: Breast, AJCC 8th Edition - Clinical stage from 04/18/2020: Stage IIIB (cT4b, cN1, cM0, G3, ER+, PR+, HER2+) - Signed by Loa Socks, NP on 04/27/2020 Stage prefix: Initial diagnosis Histologic grading system: 3 grade system   05/13/2020 -  Chemotherapy    Patient is on Treatment Plan: BREAST DOCETAXEL + TRASTUZUMAB + PERTUZUMAB (THP) Q21D        CHIEF COMPLIANT: Cycle 4 TCHP (tomorrow is cycle 4)  INTERVAL HISTORY: ARLIS EVERLY is a 80 y.o. with above-mentioned history of HER-2 positive right breast cancercurrently on neoadjuvant chemotherapy withTaxotere Herceptin Perjeta. She presentsto the clinictoday for cycle 4.  Overall she has tolerated her chemo extremely well.  The fungating tumor is massively shrunk in size and it is much more manageable and much smaller.  She has not had any nausea or vomiting she has not had any diarrhea.  The chemo is scheduled for tomorrow.    ALLERGIES:  is allergic to bee venom.  MEDICATIONS:  Current Outpatient Medications  Medication  Sig Dispense Refill  . albuterol (VENTOLIN HFA) 108 (90 Base) MCG/ACT inhaler Inhale 2 puffs into the lungs every 6 (six) hours as needed for wheezing or shortness of breath. 8 g 2  . atorvastatin (LIPITOR) 20 MG tablet Take 1 tablet (20 mg total) by mouth daily at 6 PM. 30 tablet 0  . cyanocobalamin 1000 MCG tablet Take 1,000 mcg by mouth daily.    . ergocalciferol (VITAMIN D2) 1.25 MG (50000 UT) capsule Take 50,000 Units by mouth once a week.    . feeding supplement (BOOST HIGH PROTEIN) LIQD Take 237 mLs by mouth 3 (three) times daily between meals. Please give her 90 bottles 237 mL 6  . lidocaine-prilocaine (EMLA) cream Apply to affected area once 30 g 3  . loratadine (CLARITIN) 10 MG tablet Take 10 mg by mouth daily.    Marland Kitchen nystatin (MYCOSTATIN) 100000 UNIT/ML suspension TAKE 5 MLS (500,000 UNITS TOTAL) BY MOUTH 4 (FOUR) TIMES DAILY. 60 mL 0  . nystatin (MYCOSTATIN/NYSTOP) powder Apply 1 application topically 3 (three) times daily.    . ondansetron (ZOFRAN) 8 MG tablet Take 1 tablet (8 mg total) by mouth 2 (two) times daily as needed for refractory nausea / vomiting. 30 tablet 1  . prochlorperazine (COMPAZINE) 10 MG tablet Take 1 tablet (10 mg total) by mouth every 6 (six) hours as needed (Nausea or vomiting). 30 tablet 1  . trazodone (DESYREL) 300 MG tablet Take 300 mg by mouth at bedtime.     No current facility-administered medications for this  visit.    PHYSICAL EXAMINATION: ECOG PERFORMANCE STATUS: 1 - Symptomatic but completely ambulatory  Vitals:   07/15/20 1146  BP: (!) 150/53  Pulse: 70  Resp: 16  Temp: 97.8 F (36.6 C)  SpO2: 94%   Filed Weights   07/15/20 1146  Weight: 121 lb (54.9 kg)    LABORATORY DATA:  I have reviewed the data as listed CMP Latest Ref Rng & Units 06/24/2020 06/03/2020 05/20/2020  Glucose 70 - 99 mg/dL 93 108(H) 95  BUN 8 - 23 mg/dL $Remove'16 16 14  'wYRSgCP$ Creatinine 0.44 - 1.00 mg/dL 0.76 0.64 0.79  Sodium 135 - 145 mmol/L 140 140 139  Potassium 3.5 - 5.1  mmol/L 4.1 4.1 4.1  Chloride 98 - 111 mmol/L 107 107 104  CO2 22 - 32 mmol/L $RemoveB'28 26 23  'FFdXwZZy$ Calcium 8.9 - 10.3 mg/dL 8.8(L) 8.3(L) 8.8(L)  Total Protein 6.5 - 8.1 g/dL 6.6 6.3(L) 6.4(L)  Total Bilirubin 0.3 - 1.2 mg/dL 0.3 <0.2(L) 0.4  Alkaline Phos 38 - 126 U/L 67 54 119  AST 15 - 41 U/L 14(L) 13(L) 18  ALT 0 - 44 U/L $Remo'7 7 13    'CjEnx$ Lab Results  Component Value Date   WBC 6.8 07/15/2020   HGB 9.4 (L) 07/15/2020   HCT 30.1 (L) 07/15/2020   MCV 84.6 07/15/2020   PLT 331 07/15/2020   NEUTROABS 5.0 07/15/2020    ASSESSMENT & PLAN:  Malignant neoplasm of right breast in female, estrogen receptor positive (HCC) Stage IIIc fungating tumor of the right breast: ER/PR and HER-2 positive 04/15/20: CT CAP: No distant mets  Treatment Plan: 1. Neoadj chemo withTaxotere Herceptin Perjeta 2. Mastectomywith sentinel node biopsy 3. Adj XRT 4. Adj Anti estrogen therapy -------------------------------------------------------------------------------------------------- Current treatment: Cycle 4Taxotere Herceptin Perjeta to be done tomorrow  Echocardiogram 04/23/2020: EF 55% Chemo toxicities: 1.Mild nausea 2.Fatigue 3.Anemia: Probably due to bleeding from the tumor, stable 4.Thrush: resolved  Poornutrition: I sent for boost to supplement prescription. Return to clinic in3weeks for cycle 5    No orders of the defined types were placed in this encounter.  The patient has a good understanding of the overall plan. she agrees with it. she will call with any problems that may develop before the next visit here.  Total time spent: 30 mins including face to face time and time spent for planning, charting and coordination of care  Rulon Eisenmenger, MD, MPH 07/15/2020  I, Cloyde Reams Dorshimer, am acting as scribe for Dr. Nicholas Lose.  I have reviewed the above documentation for accuracy and completeness, and I agree with the above.

## 2020-07-15 ENCOUNTER — Other Ambulatory Visit: Payer: Self-pay

## 2020-07-15 ENCOUNTER — Encounter: Payer: Self-pay | Admitting: Nutrition

## 2020-07-15 ENCOUNTER — Inpatient Hospital Stay: Payer: Medicare HMO | Attending: Hematology and Oncology | Admitting: Hematology and Oncology

## 2020-07-15 ENCOUNTER — Inpatient Hospital Stay: Payer: Medicare HMO

## 2020-07-15 ENCOUNTER — Encounter: Payer: Self-pay | Admitting: Licensed Clinical Social Worker

## 2020-07-15 DIAGNOSIS — Z809 Family history of malignant neoplasm, unspecified: Secondary | ICD-10-CM | POA: Diagnosis not present

## 2020-07-15 DIAGNOSIS — J449 Chronic obstructive pulmonary disease, unspecified: Secondary | ICD-10-CM | POA: Insufficient documentation

## 2020-07-15 DIAGNOSIS — R197 Diarrhea, unspecified: Secondary | ICD-10-CM | POA: Insufficient documentation

## 2020-07-15 DIAGNOSIS — Z95828 Presence of other vascular implants and grafts: Secondary | ICD-10-CM

## 2020-07-15 DIAGNOSIS — R5383 Other fatigue: Secondary | ICD-10-CM | POA: Insufficient documentation

## 2020-07-15 DIAGNOSIS — R55 Syncope and collapse: Secondary | ICD-10-CM | POA: Insufficient documentation

## 2020-07-15 DIAGNOSIS — Z79899 Other long term (current) drug therapy: Secondary | ICD-10-CM | POA: Insufficient documentation

## 2020-07-15 DIAGNOSIS — N179 Acute kidney failure, unspecified: Secondary | ICD-10-CM | POA: Diagnosis not present

## 2020-07-15 DIAGNOSIS — C50911 Malignant neoplasm of unspecified site of right female breast: Secondary | ICD-10-CM

## 2020-07-15 DIAGNOSIS — Z5900 Homelessness unspecified: Secondary | ICD-10-CM | POA: Diagnosis not present

## 2020-07-15 DIAGNOSIS — Z833 Family history of diabetes mellitus: Secondary | ICD-10-CM | POA: Insufficient documentation

## 2020-07-15 DIAGNOSIS — Z5189 Encounter for other specified aftercare: Secondary | ICD-10-CM | POA: Insufficient documentation

## 2020-07-15 DIAGNOSIS — Z87891 Personal history of nicotine dependence: Secondary | ICD-10-CM | POA: Diagnosis not present

## 2020-07-15 DIAGNOSIS — Z801 Family history of malignant neoplasm of trachea, bronchus and lung: Secondary | ICD-10-CM | POA: Insufficient documentation

## 2020-07-15 DIAGNOSIS — D649 Anemia, unspecified: Secondary | ICD-10-CM | POA: Diagnosis not present

## 2020-07-15 DIAGNOSIS — Z5112 Encounter for antineoplastic immunotherapy: Secondary | ICD-10-CM | POA: Diagnosis not present

## 2020-07-15 DIAGNOSIS — Z170 Estrogen receptor positive status [ER+]: Secondary | ICD-10-CM | POA: Insufficient documentation

## 2020-07-15 DIAGNOSIS — Z5111 Encounter for antineoplastic chemotherapy: Secondary | ICD-10-CM | POA: Diagnosis not present

## 2020-07-15 LAB — CMP (CANCER CENTER ONLY)
ALT: <6 U/L (ref 0–44)
AST: 11 U/L — ABNORMAL LOW (ref 15–41)
Albumin: 3.4 g/dL — ABNORMAL LOW (ref 3.5–5.0)
Alkaline Phosphatase: 67 U/L (ref 38–126)
Anion gap: 10 (ref 5–15)
BUN: 18 mg/dL (ref 8–23)
CO2: 26 mmol/L (ref 22–32)
Calcium: 8.7 mg/dL — ABNORMAL LOW (ref 8.9–10.3)
Chloride: 106 mmol/L (ref 98–111)
Creatinine: 0.76 mg/dL (ref 0.44–1.00)
GFR, Estimated: >60 mL/min (ref >60)
Glucose, Bld: 148 mg/dL — ABNORMAL HIGH (ref 70–99)
Potassium: 3.9 mmol/L (ref 3.5–5.1)
Sodium: 142 mmol/L (ref 135–145)
Total Bilirubin: 0.4 mg/dL (ref 0.3–1.2)
Total Protein: 6.5 g/dL (ref 6.5–8.1)

## 2020-07-15 LAB — CBC WITH DIFFERENTIAL (CANCER CENTER ONLY)
Abs Immature Granulocytes: 0.02 10*3/uL (ref 0.00–0.07)
Basophils Absolute: 0.1 10*3/uL (ref 0.0–0.1)
Basophils Relative: 1 %
Eosinophils Absolute: 0.1 10*3/uL (ref 0.0–0.5)
Eosinophils Relative: 1 %
HCT: 30.1 % — ABNORMAL LOW (ref 36.0–46.0)
Hemoglobin: 9.4 g/dL — ABNORMAL LOW (ref 12.0–15.0)
Immature Granulocytes: 0 %
Lymphocytes Relative: 16 %
Lymphs Abs: 1.1 10*3/uL (ref 0.7–4.0)
MCH: 26.4 pg (ref 26.0–34.0)
MCHC: 31.2 g/dL (ref 30.0–36.0)
MCV: 84.6 fL (ref 80.0–100.0)
Monocytes Absolute: 0.6 10*3/uL (ref 0.1–1.0)
Monocytes Relative: 8 %
Neutro Abs: 5 10*3/uL (ref 1.7–7.7)
Neutrophils Relative %: 74 %
Platelet Count: 331 10*3/uL (ref 150–400)
RBC: 3.56 MIL/uL — ABNORMAL LOW (ref 3.87–5.11)
RDW: 15.8 % — ABNORMAL HIGH (ref 11.5–15.5)
WBC Count: 6.8 10*3/uL (ref 4.0–10.5)
nRBC: 0 % (ref 0.0–0.2)

## 2020-07-15 MED ORDER — SODIUM CHLORIDE 0.9% FLUSH
10.0000 mL | Freq: Once | INTRAVENOUS | Status: AC
  Administered 2020-07-15: 10 mL
  Filled 2020-07-15: qty 10

## 2020-07-15 MED ORDER — HEPARIN SOD (PORK) LOCK FLUSH 100 UNIT/ML IV SOLN
500.0000 [IU] | Freq: Once | INTRAVENOUS | Status: AC
  Administered 2020-07-15: 500 [IU]
  Filled 2020-07-15: qty 5

## 2020-07-15 NOTE — Assessment & Plan Note (Signed)
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 Neoadj chemo withTaxotere Herceptin Perjeta 2. Mastectomywith sentinel node biopsy 3. Adj XRT 4. Adj Anti estrogen therapy -------------------------------------------------------------------------------------------------- Current treatment: Cycle 3Taxotere Herceptin Perjeta  Echocardiogram 04/23/2020: EF 55% Chemo toxicities: 1.Mild nausea 2.Fatigue 3.Anemia: Probably due to bleeding from the tumor, stable 4.Thrush: resolved  Poornutrition: I sent for boost to supplement prescription. Return to clinic in3weeks for cycle 4

## 2020-07-15 NOTE — Patient Instructions (Signed)
Implanted Port Insertion, Care After This sheet gives you information about how to care for yourself after your procedure. Your health care provider may also give you more specific instructions. If you have problems or questions, contact your health care provider. What can I expect after the procedure? After the procedure, it is common to have:  Discomfort at the port insertion site.  Bruising on the skin over the port. This should improve over 3-4 days. Follow these instructions at home: Port care  After your port is placed, you will get a manufacturer's information card. The card has information about your port. Keep this card with you at all times.  Take care of the port as told by your health care provider. Ask your health care provider if you or a family member can get training for taking care of the port at home. A home health care nurse may also take care of the port.  Make sure to remember what type of port you have. Incision care  Follow instructions from your health care provider about how to take care of your port insertion site. Make sure you: ? Wash your hands with soap and water before and after you change your bandage (dressing). If soap and water are not available, use hand sanitizer. ? Change your dressing as told by your health care provider. ? Leave stitches (sutures), skin glue, or adhesive strips in place. These skin closures may need to stay in place for 2 weeks or longer. If adhesive strip edges start to loosen and curl up, you may trim the loose edges. Do not remove adhesive strips completely unless your health care provider tells you to do that.  Check your port insertion site every day for signs of infection. Check for: ? Redness, swelling, or pain. ? Fluid or blood. ? Warmth. ? Pus or a bad smell.      Activity  Return to your normal activities as told by your health care provider. Ask your health care provider what activities are safe for you.  Do not  lift anything that is heavier than 10 lb (4.5 kg), or the limit that you are told, until your health care provider says that it is safe. General instructions  Take over-the-counter and prescription medicines only as told by your health care provider.  Do not take baths, swim, or use a hot tub until your health care provider approves. Ask your health care provider if you may take showers. You may only be allowed to take sponge baths.  Do not drive for 24 hours if you were given a sedative during your procedure.  Wear a medical alert bracelet in case of an emergency. This will tell any health care providers that you have a port.  Keep all follow-up visits as told by your health care provider. This is important. Contact a health care provider if:  You cannot flush your port with saline as directed, or you cannot draw blood from the port.  You have a fever or chills.  You have redness, swelling, or pain around your port insertion site.  You have fluid or blood coming from your port insertion site.  Your port insertion site feels warm to the touch.  You have pus or a bad smell coming from the port insertion site. Get help right away if:  You have chest pain or shortness of breath.  You have bleeding from your port that you cannot control. Summary  Take care of the port as told by your   health care provider. Keep the manufacturer's information card with you at all times.  Change your dressing as told by your health care provider.  Contact a health care provider if you have a fever or chills or if you have redness, swelling, or pain around your port insertion site.  Keep all follow-up visits as told by your health care provider. This information is not intended to replace advice given to you by your health care provider. Make sure you discuss any questions you have with your health care provider. Document Revised: 08/27/2017 Document Reviewed: 08/27/2017 Elsevier Patient Education   2021 Elsevier Inc.  

## 2020-07-15 NOTE — Progress Notes (Signed)
[facility] CSW Progress Note  Holiday representative met with patient and daughter in exam room. Provided [employer] gift card and application for [employer] today.  The home they will be renting is still not ready. They continue to stay with friends. Pt's daughter is trying to purchase a vehicle so when she can return to work in July (when chemo for pt is completed), she will be ready.  CSW plans to see pt at appt 08/26/20   Christeen Douglas , LCSW

## 2020-07-15 NOTE — Progress Notes (Signed)
Provided 1 complementary case of Ensure Plus. 

## 2020-07-16 ENCOUNTER — Other Ambulatory Visit: Payer: Self-pay

## 2020-07-16 ENCOUNTER — Inpatient Hospital Stay: Payer: Medicare HMO

## 2020-07-16 VITALS — BP 133/61 | HR 77 | Temp 97.6°F | Resp 17

## 2020-07-16 DIAGNOSIS — Z170 Estrogen receptor positive status [ER+]: Secondary | ICD-10-CM | POA: Diagnosis not present

## 2020-07-16 DIAGNOSIS — Z5189 Encounter for other specified aftercare: Secondary | ICD-10-CM | POA: Diagnosis not present

## 2020-07-16 DIAGNOSIS — C50911 Malignant neoplasm of unspecified site of right female breast: Secondary | ICD-10-CM | POA: Diagnosis not present

## 2020-07-16 DIAGNOSIS — R197 Diarrhea, unspecified: Secondary | ICD-10-CM | POA: Diagnosis not present

## 2020-07-16 DIAGNOSIS — Z5111 Encounter for antineoplastic chemotherapy: Secondary | ICD-10-CM | POA: Diagnosis not present

## 2020-07-16 DIAGNOSIS — N179 Acute kidney failure, unspecified: Secondary | ICD-10-CM | POA: Diagnosis not present

## 2020-07-16 DIAGNOSIS — R5383 Other fatigue: Secondary | ICD-10-CM | POA: Diagnosis not present

## 2020-07-16 DIAGNOSIS — Z5112 Encounter for antineoplastic immunotherapy: Secondary | ICD-10-CM | POA: Diagnosis not present

## 2020-07-16 DIAGNOSIS — D649 Anemia, unspecified: Secondary | ICD-10-CM | POA: Diagnosis not present

## 2020-07-16 MED ORDER — ACETAMINOPHEN 325 MG PO TABS
650.0000 mg | ORAL_TABLET | Freq: Once | ORAL | Status: AC
Start: 2020-07-16 — End: 2020-07-16
  Administered 2020-07-16: 650 mg via ORAL

## 2020-07-16 MED ORDER — DIPHENHYDRAMINE HCL 25 MG PO CAPS
ORAL_CAPSULE | ORAL | Status: AC
  Filled 2020-07-16: qty 1

## 2020-07-16 MED ORDER — SODIUM CHLORIDE 0.9 % IV SOLN
Freq: Once | INTRAVENOUS | Status: AC
  Filled 2020-07-16: qty 250

## 2020-07-16 MED ORDER — SODIUM CHLORIDE 0.9% FLUSH
10.0000 mL | INTRAVENOUS | Status: DC | PRN
  Administered 2020-07-16: 10 mL
  Filled 2020-07-16: qty 10

## 2020-07-16 MED ORDER — SODIUM CHLORIDE 0.9 % IV SOLN
420.0000 mg | Freq: Once | INTRAVENOUS | Status: AC
  Administered 2020-07-16: 420 mg via INTRAVENOUS
  Filled 2020-07-16: qty 14

## 2020-07-16 MED ORDER — DOCETAXEL CHEMO INJECTION 160 MG/16ML
50.0000 mg/m2 | Freq: Once | INTRAVENOUS | Status: AC
  Administered 2020-07-16: 80 mg via INTRAVENOUS
  Filled 2020-07-16: qty 8

## 2020-07-16 MED ORDER — ACETAMINOPHEN 325 MG PO TABS
ORAL_TABLET | ORAL | Status: AC
  Filled 2020-07-16: qty 2

## 2020-07-16 MED ORDER — TRASTUZUMAB-ANNS CHEMO 150 MG IV SOLR
6.0000 mg/kg | Freq: Once | INTRAVENOUS | Status: AC
  Administered 2020-07-16: 336 mg via INTRAVENOUS
  Filled 2020-07-16: qty 16

## 2020-07-16 MED ORDER — HEPARIN SOD (PORK) LOCK FLUSH 100 UNIT/ML IV SOLN
500.0000 [IU] | Freq: Once | INTRAVENOUS | Status: AC | PRN
  Administered 2020-07-16: 500 [IU]
  Filled 2020-07-16: qty 5

## 2020-07-16 MED ORDER — SODIUM CHLORIDE 0.9 % IV SOLN
10.0000 mg | Freq: Once | INTRAVENOUS | Status: AC
  Administered 2020-07-16: 10 mg via INTRAVENOUS
  Filled 2020-07-16: qty 10

## 2020-07-16 MED ORDER — DIPHENHYDRAMINE HCL 25 MG PO CAPS
25.0000 mg | ORAL_CAPSULE | Freq: Once | ORAL | Status: AC
Start: 2020-07-16 — End: 2020-07-16
  Administered 2020-07-16: 25 mg via ORAL

## 2020-07-16 NOTE — Patient Instructions (Signed)
[facility]  Discharge Instructions: Thank you for choosing [facility] to provide your oncology and hematology care.   If you have a lab appointment with the [facility], please go directly to the [facility] and check in at the registration area.   Wear comfortable clothing and clothing appropriate for easy access to any Portacath or PICC line.   We strive to give you quality time with your provider. You may need to reschedule your appointment if you arrive late (15 or more minutes).  Arriving late affects you and other patients whose appointments are after yours.  Also, if you miss three or more appointments without notifying the office, you may be dismissed from the clinic at the provider's discretion.      For prescription refill requests, have your pharmacy contact our office and allow 72 hours for refills to be completed.    Today you received the following chemotherapy and/or immunotherapy agents: Herceptin, Taxotere and Perjeta.    To help prevent nausea and vomiting after your treatment, we encourage you to take your nausea medication as directed.  BELOW ARE SYMPTOMS THAT SHOULD BE REPORTED IMMEDIATELY: . *FEVER GREATER THAN 100.4 F (38 C) OR HIGHER . *CHILLS OR SWEATING . *NAUSEA AND VOMITING THAT IS NOT CONTROLLED WITH YOUR NAUSEA MEDICATION . *UNUSUAL SHORTNESS OF BREATH . *UNUSUAL BRUISING OR BLEEDING . *URINARY PROBLEMS (pain or burning when urinating, or frequent urination) . *BOWEL PROBLEMS (unusual diarrhea, constipation, pain near the anus) . TENDERNESS IN MOUTH AND THROAT WITH OR WITHOUT PRESENCE OF ULCERS (sore throat, sores in mouth, or a toothache) . UNUSUAL RASH, SWELLING OR PAIN  . UNUSUAL VAGINAL DISCHARGE OR ITCHING   Items with * indicate a potential emergency and should be followed up as soon as possible or go to the Emergency Department if any problems should occur.  Please show the CHEMOTHERAPY ALERT CARD or  IMMUNOTHERAPY ALERT CARD at check-in to the Emergency Department and triage nurse.  Should you have questions after your visit or need to cancel or reschedule your appointment, please contact [facility]  Dept: [phone]  and follow the prompts.  Office hours are 8:00 a.m. to 4:30 p.m. Monday - Friday. Please note that voicemails left after 4:00 p.m. may not be returned until the following business day.  We are closed weekends and major holidays. You have access to a nurse at all times for urgent questions. Please call the main number to the clinic Dept: [phone] and follow the prompts.   For any non-urgent questions, you may also contact your provider using MyChart. We now offer e-Visits for anyone 5 and older to request care online for non-urgent symptoms. For details visit [email or web address].   Also download the MyChart app! Go to the app store, search "MyChart", open the app, select [facility], and log in with your MyChart username and password.  Due to Covid, a mask is required upon entering the hospital/clinic. If you do not have a mask, one will be given to you upon arrival. For doctor visits, patients may have 1 support person aged 88 or older with them. For treatment visits, patients cannot have anyone with them due to current Covid guidelines and our immunocompromised population.  Trastuzumab injection for infusion What is this medicine? TRASTUZUMAB (tras TOO zoo mab) is a monoclonal antibody. It is used to treat breast cancer and stomach cancer. This medicine may be used for other purposes; ask your health care  provider or pharmacist if you have questions. COMMON BRAND NAME(S): Herceptin, Galvin Proffer, Trazimera What should I tell my health care provider before I take this medicine? They need to know if you have any of these conditions:  heart disease  heart failure  lung or breathing disease, like asthma  an  unusual or allergic reaction to trastuzumab, benzyl alcohol, or other medications, foods, dyes, or preservatives  pregnant or trying to get pregnant  breast-feeding How should I use this medicine? This drug is given as an infusion into a vein. It is administered in a hospital or clinic by a specially trained health care professional. Talk to your pediatrician regarding the use of this medicine in children. This medicine is not approved for use in children. Overdosage: If you think you have taken too much of this medicine contact a poison control center or emergency room at once. NOTE: This medicine is only for you. Do not share this medicine with others. What if I miss a dose? It is important not to miss a dose. Call your doctor or health care professional if you are unable to keep an appointment. What may interact with this medicine? This medicine may interact with the following medications:  certain types of chemotherapy, such as daunorubicin, doxorubicin, epirubicin, and idarubicin This list may not describe all possible interactions. Give your health care provider a list of all the medicines, herbs, non-prescription drugs, or dietary supplements you use. Also tell them if you smoke, drink alcohol, or use illegal drugs. Some items may interact with your medicine. What should I watch for while using this medicine? Visit your doctor for checks on your progress. Report any side effects. Continue your course of treatment even though you feel ill unless your doctor tells you to stop. Call your doctor or health care professional for advice if you get a fever, chills or sore throat, or other symptoms of a cold or flu. Do not treat yourself. Try to avoid being around people who are sick. You may experience fever, chills and shaking during your first infusion. These effects are usually mild and can be treated with other medicines. Report any side effects during the infusion to your health care  professional. Fever and chills usually do not happen with later infusions. Do not become pregnant while taking this medicine or for 7 months after stopping it. Women should inform their doctor if they wish to become pregnant or think they might be pregnant. Women of child-bearing potential will need to have a negative pregnancy test before starting this medicine. There is a potential for serious side effects to an unborn child. Talk to your health care professional or pharmacist for more information. Do not breast-feed an infant while taking this medicine or for 7 months after stopping it. Women must use effective birth control with this medicine. What side effects may I notice from receiving this medicine? Side effects that you should report to your doctor or health care professional as soon as possible:  allergic reactions like skin rash, itching or hives, swelling of the face, lips, or tongue  chest pain or palpitations  cough  dizziness  feeling faint or lightheaded, falls  fever  general ill feeling or flu-like symptoms  signs of worsening heart failure like breathing problems; swelling in your legs and feet  unusually weak or tired Side effects that usually do not require medical attention (report to your doctor or health care professional if they continue or are bothersome):  bone  pain  changes in taste  diarrhea  joint pain  nausea/vomiting  weight loss This list may not describe all possible side effects. Call your doctor for medical advice about side effects. You may report side effects to FDA at 1-800-FDA-1088. Where should I keep my medicine? This drug is given in a hospital or clinic and will not be stored at home. NOTE: This sheet is a summary. It may not cover all possible information. If you have questions about this medicine, talk to your doctor, pharmacist, or health care provider.  2021 Elsevier/Gold Standard (2016-01-24 14:37:52) Pertuzumab  injection What is this medicine? PERTUZUMAB (per TOOZ ue mab) is a monoclonal antibody. It is used to treat breast cancer. This medicine may be used for other purposes; ask your health care provider or pharmacist if you have questions. COMMON BRAND NAME(S): PERJETA What should I tell my health care provider before I take this medicine? They need to know if you have any of these conditions:  heart disease  heart failure  high blood pressure  history of irregular heart beat  recent or ongoing radiation therapy  an unusual or allergic reaction to pertuzumab, other medicines, foods, dyes, or preservatives  pregnant or trying to get pregnant  breast-feeding How should I use this medicine? This medicine is for infusion into a vein. It is given by a health care professional in a hospital or clinic setting. Talk to your pediatrician regarding the use of this medicine in children. Special care may be needed. Overdosage: If you think you have taken too much of this medicine contact a poison control center or emergency room at once. NOTE: This medicine is only for you. Do not share this medicine with others. What if I miss a dose? It is important not to miss your dose. Call your doctor or health care professional if you are unable to keep an appointment. What may interact with this medicine? Interactions are not expected. Give your health care provider a list of all the medicines, herbs, non-prescription drugs, or dietary supplements you use. Also tell them if you smoke, drink alcohol, or use illegal drugs. Some items may interact with your medicine. This list may not describe all possible interactions. Give your health care provider a list of all the medicines, herbs, non-prescription drugs, or dietary supplements you use. Also tell them if you smoke, drink alcohol, or use illegal drugs. Some items may interact with your medicine. What should I watch for while using this medicine? Your  condition will be monitored carefully while you are receiving this medicine. Report any side effects. Continue your course of treatment even though you feel ill unless your doctor tells you to stop. Do not become pregnant while taking this medicine or for 7 months after stopping it. Women should inform their doctor if they wish to become pregnant or think they might be pregnant. Women of child-bearing potential will need to have a negative pregnancy test before starting this medicine. There is a potential for serious side effects to an unborn child. Talk to your health care professional or pharmacist for more information. Do not breast-feed an infant while taking this medicine or for 7 months after stopping it. Women must use effective birth control with this medicine. Call your doctor or health care professional for advice if you get a fever, chills or sore throat, or other symptoms of a cold or flu. Do not treat yourself. Try to avoid being around people who are sick. You may experience fever,  chills, and headache during the infusion. Report any side effects during the infusion to your health care professional. What side effects may I notice from receiving this medicine? Side effects that you should report to your doctor or health care professional as soon as possible:  breathing problems  chest pain or palpitations  dizziness  feeling faint or lightheaded  fever or chills  skin rash, itching or hives  sore throat  swelling of the face, lips, or tongue  swelling of the legs or ankles  unusually weak or tired Side effects that usually do not require medical attention (report to your doctor or health care professional if they continue or are bothersome):  diarrhea  hair loss  nausea, vomiting  tiredness This list may not describe all possible side effects. Call your doctor for medical advice about side effects. You may report side effects to FDA at 1-800-FDA-1088. Where should I  keep my medicine? This drug is given in a hospital or clinic and will not be stored at home. NOTE: This sheet is a summary. It may not cover all possible information. If you have questions about this medicine, talk to your doctor, pharmacist, or health care provider.  2021 Elsevier/Gold Standard (2015-03-03 12:08:50) Docetaxel injection What is this medicine? DOCETAXEL (doe se TAX el) is a chemotherapy drug. It targets fast dividing cells, like cancer cells, and causes these cells to die. This medicine is used to treat many types of cancers like breast cancer, certain stomach cancers, head and neck cancer, lung cancer, and prostate cancer. This medicine may be used for other purposes; ask your health care provider or pharmacist if you have questions. COMMON BRAND NAME(S): Docefrez, Taxotere What should I tell my health care provider before I take this medicine? They need to know if you have any of these conditions:  infection (especially a virus infection such as chickenpox, cold sores, or herpes)  liver disease  low blood counts, like low white cell, platelet, or red cell counts  an unusual or allergic reaction to docetaxel, polysorbate 80, other chemotherapy agents, other medicines, foods, dyes, or preservatives  pregnant or trying to get pregnant  breast-feeding How should I use this medicine? This drug is given as an infusion into a vein. It is administered in a hospital or clinic by a specially trained health care professional. Talk to your pediatrician regarding the use of this medicine in children. Special care may be needed. Overdosage: If you think you have taken too much of this medicine contact a poison control center or emergency room at once. NOTE: This medicine is only for you. Do not share this medicine with others. What if I miss a dose? It is important not to miss your dose. Call your doctor or health care professional if you are unable to keep an appointment. What may  interact with this medicine? Do not take this medicine with any of the following medications:  live virus vaccines This medicine may also interact with the following medications:  aprepitant  certain antibiotics like erythromycin or clarithromycin  certain antivirals for HIV or hepatitis  certain medicines for fungal infections like fluconazole, itraconazole, ketoconazole, posaconazole, or voriconazole  cimetidine  ciprofloxacin  conivaptan  cyclosporine  dronedarone  fluvoxamine  grapefruit juice  imatinib  verapamil This list may not describe all possible interactions. Give your health care provider a list of all the medicines, herbs, non-prescription drugs, or dietary supplements you use. Also tell them if you smoke, drink alcohol, or use illegal  drugs. Some items may interact with your medicine. What should I watch for while using this medicine? Your condition will be monitored carefully while you are receiving this medicine. You will need important blood work done while you are taking this medicine. Call your doctor or health care professional for advice if you get a fever, chills or sore throat, or other symptoms of a cold or flu. Do not treat yourself. This drug decreases your body's ability to fight infections. Try to avoid being around people who are sick. Some products may contain alcohol. Ask your health care professional if this medicine contains alcohol. Be sure to tell all health care professionals you are taking this medicine. Certain medicines, like metronidazole and disulfiram, can cause an unpleasant reaction when taken with alcohol. The reaction includes flushing, headache, nausea, vomiting, sweating, and increased thirst. The reaction can last from 30 minutes to several hours. You may get drowsy or dizzy. Do not drive, use machinery, or do anything that needs mental alertness until you know how this medicine affects you. Do not stand or sit up quickly,  especially if you are an older patient. This reduces the risk of dizzy or fainting spells. Alcohol may interfere with the effect of this medicine. Talk to your health care professional about your risk of cancer. You may be more at risk for certain types of cancer if you take this medicine. Do not become pregnant while taking this medicine or for 6 months after stopping it. Women should inform their doctor if they wish to become pregnant or think they might be pregnant. There is a potential for serious side effects to an unborn child. Talk to your health care professional or pharmacist for more information. Do not breast-feed an infant while taking this medicine or for 1 week after stopping it. Males who get this medicine must use a condom during sex with females who can get pregnant. If you get a woman pregnant, the baby could have birth defects. The baby could die before they are born. You will need to continue wearing a condom for 3 months after stopping the medicine. Tell your health care provider right away if your partner becomes pregnant while you are taking this medicine. This may interfere with the ability to father a child. You should talk to your doctor or health care professional if you are concerned about your fertility. What side effects may I notice from receiving this medicine? Side effects that you should report to your doctor or health care professional as soon as possible:  allergic reactions like skin rash, itching or hives, swelling of the face, lips, or tongue  blurred vision  breathing problems  changes in vision  low blood counts - This drug may decrease the number of white blood cells, red blood cells and platelets. You may be at increased risk for infections and bleeding.  nausea and vomiting  pain, redness or irritation at site where injected  pain, tingling, numbness in the hands or feet  redness, blistering, peeling, or loosening of the skin, including inside the  mouth  signs of decreased platelets or bleeding - bruising, pinpoint red spots on the skin, black, tarry stools, nosebleeds  signs of decreased red blood cells - unusually weak or tired, fainting spells, lightheadedness  signs of infection - fever or chills, cough, sore throat, pain or difficulty passing urine  swelling of the ankle, feet, hands Side effects that usually do not require medical attention (report to your doctor or health care  professional if they continue or are bothersome):  constipation  diarrhea  fingernail or toenail changes  hair loss  loss of appetite  mouth sores  muscle pain This list may not describe all possible side effects. Call your doctor for medical advice about side effects. You may report side effects to FDA at 1-800-FDA-1088. Where should I keep my medicine? This drug is given in a hospital or clinic and will not be stored at home. NOTE: This sheet is a summary. It may not cover all possible information. If you have questions about this medicine, talk to your doctor, pharmacist, or health care provider.  2021 Elsevier/Gold Standard (2018-12-29 19:50:31)

## 2020-07-18 ENCOUNTER — Telehealth: Payer: Self-pay | Admitting: Adult Health

## 2020-07-18 ENCOUNTER — Inpatient Hospital Stay: Payer: Medicare HMO

## 2020-07-18 ENCOUNTER — Other Ambulatory Visit: Payer: Self-pay

## 2020-07-18 VITALS — BP 153/62 | HR 85 | Temp 98.2°F | Resp 16

## 2020-07-18 DIAGNOSIS — N179 Acute kidney failure, unspecified: Secondary | ICD-10-CM | POA: Diagnosis not present

## 2020-07-18 DIAGNOSIS — Z5189 Encounter for other specified aftercare: Secondary | ICD-10-CM | POA: Diagnosis not present

## 2020-07-18 DIAGNOSIS — Z5112 Encounter for antineoplastic immunotherapy: Secondary | ICD-10-CM | POA: Diagnosis not present

## 2020-07-18 DIAGNOSIS — R5383 Other fatigue: Secondary | ICD-10-CM | POA: Diagnosis not present

## 2020-07-18 DIAGNOSIS — Z170 Estrogen receptor positive status [ER+]: Secondary | ICD-10-CM | POA: Diagnosis not present

## 2020-07-18 DIAGNOSIS — Z5111 Encounter for antineoplastic chemotherapy: Secondary | ICD-10-CM | POA: Diagnosis not present

## 2020-07-18 DIAGNOSIS — R197 Diarrhea, unspecified: Secondary | ICD-10-CM | POA: Diagnosis not present

## 2020-07-18 DIAGNOSIS — C50911 Malignant neoplasm of unspecified site of right female breast: Secondary | ICD-10-CM | POA: Diagnosis not present

## 2020-07-18 DIAGNOSIS — D649 Anemia, unspecified: Secondary | ICD-10-CM | POA: Diagnosis not present

## 2020-07-18 MED ORDER — PEGFILGRASTIM-CBQV 6 MG/0.6ML ~~LOC~~ SOSY
6.0000 mg | PREFILLED_SYRINGE | Freq: Once | SUBCUTANEOUS | Status: AC
  Administered 2020-07-18: 6 mg via SUBCUTANEOUS

## 2020-07-18 MED ORDER — PEGFILGRASTIM-CBQV 6 MG/0.6ML ~~LOC~~ SOSY
PREFILLED_SYRINGE | SUBCUTANEOUS | Status: AC
  Filled 2020-07-18: qty 0.6

## 2020-07-18 NOTE — Patient Instructions (Signed)
Pegfilgrastim injection What is this medicine? PEGFILGRASTIM (PEG fil gra stim) is a long-acting granulocyte colony-stimulating factor that stimulates the growth of neutrophils, a type of white blood cell important in the body's fight against infection. It is used to reduce the incidence of fever and infection in patients with certain types of cancer who are receiving chemotherapy that affects the bone marrow, and to increase survival after being exposed to high doses of radiation. This medicine may be used for other purposes; ask your health care provider or pharmacist if you have questions. COMMON BRAND NAME(S): Fulphila, Neulasta, Nyvepria, UDENYCA, Ziextenzo What should I tell my health care provider before I take this medicine? They need to know if you have any of these conditions:  kidney disease  latex allergy  ongoing radiation therapy  sickle cell disease  skin reactions to acrylic adhesives (On-Body Injector only)  an unusual or allergic reaction to pegfilgrastim, filgrastim, other medicines, foods, dyes, or preservatives  pregnant or trying to get pregnant  breast-feeding How should I use this medicine? This medicine is for injection under the skin. If you get this medicine at home, you will be taught how to prepare and give the pre-filled syringe or how to use the On-body Injector. Refer to the patient Instructions for Use for detailed instructions. Use exactly as directed. Tell your healthcare provider immediately if you suspect that the On-body Injector may not have performed as intended or if you suspect the use of the On-body Injector resulted in a missed or partial dose. It is important that you put your used needles and syringes in a special sharps container. Do not put them in a trash can. If you do not have a sharps container, call your pharmacist or healthcare provider to get one. Talk to your pediatrician regarding the use of this medicine in children. While this drug  may be prescribed for selected conditions, precautions do apply. Overdosage: If you think you have taken too much of this medicine contact a poison control center or emergency room at once. NOTE: This medicine is only for you. Do not share this medicine with others. What if I miss a dose? It is important not to miss your dose. Call your doctor or health care professional if you miss your dose. If you miss a dose due to an On-body Injector failure or leakage, a new dose should be administered as soon as possible using a single prefilled syringe for manual use. What may interact with this medicine? Interactions have not been studied. This list may not describe all possible interactions. Give your health care provider a list of all the medicines, herbs, non-prescription drugs, or dietary supplements you use. Also tell them if you smoke, drink alcohol, or use illegal drugs. Some items may interact with your medicine. What should I watch for while using this medicine? Your condition will be monitored carefully while you are receiving this medicine. You may need blood work done while you are taking this medicine. Talk to your health care provider about your risk of cancer. You may be more at risk for certain types of cancer if you take this medicine. If you are going to need a MRI, CT scan, or other procedure, tell your doctor that you are using this medicine (On-Body Injector only). What side effects may I notice from receiving this medicine? Side effects that you should report to your doctor or health care professional as soon as possible:  allergic reactions (skin rash, itching or hives, swelling of   the face, lips, or tongue)  back pain  dizziness  fever  pain, redness, or irritation at site where injected  pinpoint red spots on the skin  red or dark-brown urine  shortness of breath or breathing problems  stomach or side pain, or pain at the shoulder  swelling  tiredness  trouble  passing urine or change in the amount of urine  unusual bruising or bleeding Side effects that usually do not require medical attention (report to your doctor or health care professional if they continue or are bothersome):  bone pain  muscle pain This list may not describe all possible side effects. Call your doctor for medical advice about side effects. You may report side effects to FDA at 1-800-FDA-1088. Where should I keep my medicine? Keep out of the reach of children. If you are using this medicine at home, you will be instructed on how to store it. Throw away any unused medicine after the expiration date on the label. NOTE: This sheet is a summary. It may not cover all possible information. If you have questions about this medicine, talk to your doctor, pharmacist, or health care provider.  2021 Elsevier/Gold Standard (2019-02-20 13:20:51)  

## 2020-07-18 NOTE — Telephone Encounter (Signed)
R/s per 6/6 sch msg, daughter pt aware

## 2020-07-20 ENCOUNTER — Telehealth: Payer: Self-pay | Admitting: Nutrition

## 2020-07-20 ENCOUNTER — Inpatient Hospital Stay: Payer: Medicare HMO | Admitting: Nutrition

## 2020-07-20 NOTE — Telephone Encounter (Signed)
Contacted patient's daughter [name] for nutrition follow-up.  She was currently in the car driving patient to doctor's appointment.  It was very difficult to hear so she offered to return my phone call at a later time.  She has RD contact information.

## 2020-08-04 ENCOUNTER — Ambulatory Visit (HOSPITAL_COMMUNITY): Payer: Medicare HMO | Attending: Cardiology

## 2020-08-04 ENCOUNTER — Other Ambulatory Visit: Payer: Self-pay

## 2020-08-04 DIAGNOSIS — R011 Cardiac murmur, unspecified: Secondary | ICD-10-CM | POA: Insufficient documentation

## 2020-08-05 ENCOUNTER — Encounter: Payer: Self-pay | Admitting: Adult Health

## 2020-08-05 ENCOUNTER — Other Ambulatory Visit: Payer: Self-pay | Admitting: Adult Health

## 2020-08-05 ENCOUNTER — Inpatient Hospital Stay: Payer: Medicare HMO

## 2020-08-05 ENCOUNTER — Other Ambulatory Visit: Payer: Medicare HMO

## 2020-08-05 ENCOUNTER — Inpatient Hospital Stay (HOSPITAL_BASED_OUTPATIENT_CLINIC_OR_DEPARTMENT_OTHER): Payer: Medicare HMO | Admitting: Adult Health

## 2020-08-05 ENCOUNTER — Ambulatory Visit: Payer: Medicare HMO | Admitting: Adult Health

## 2020-08-05 ENCOUNTER — Other Ambulatory Visit: Payer: Self-pay | Admitting: *Deleted

## 2020-08-05 ENCOUNTER — Inpatient Hospital Stay: Payer: Medicare HMO | Admitting: Dietician

## 2020-08-05 ENCOUNTER — Encounter: Payer: Self-pay | Admitting: *Deleted

## 2020-08-05 VITALS — BP 180/72 | HR 67 | Temp 97.7°F | Resp 19 | Ht 65.5 in | Wt 120.2 lb

## 2020-08-05 DIAGNOSIS — C50911 Malignant neoplasm of unspecified site of right female breast: Secondary | ICD-10-CM | POA: Diagnosis not present

## 2020-08-05 DIAGNOSIS — Z5111 Encounter for antineoplastic chemotherapy: Secondary | ICD-10-CM | POA: Diagnosis not present

## 2020-08-05 DIAGNOSIS — R32 Unspecified urinary incontinence: Secondary | ICD-10-CM | POA: Diagnosis not present

## 2020-08-05 DIAGNOSIS — Z5189 Encounter for other specified aftercare: Secondary | ICD-10-CM | POA: Diagnosis not present

## 2020-08-05 DIAGNOSIS — R5383 Other fatigue: Secondary | ICD-10-CM | POA: Diagnosis not present

## 2020-08-05 DIAGNOSIS — R197 Diarrhea, unspecified: Secondary | ICD-10-CM | POA: Diagnosis not present

## 2020-08-05 DIAGNOSIS — Z170 Estrogen receptor positive status [ER+]: Secondary | ICD-10-CM

## 2020-08-05 DIAGNOSIS — Z5112 Encounter for antineoplastic immunotherapy: Secondary | ICD-10-CM | POA: Diagnosis not present

## 2020-08-05 DIAGNOSIS — J449 Chronic obstructive pulmonary disease, unspecified: Secondary | ICD-10-CM

## 2020-08-05 DIAGNOSIS — D649 Anemia, unspecified: Secondary | ICD-10-CM | POA: Diagnosis not present

## 2020-08-05 DIAGNOSIS — N179 Acute kidney failure, unspecified: Secondary | ICD-10-CM | POA: Diagnosis not present

## 2020-08-05 LAB — CBC WITH DIFFERENTIAL (CANCER CENTER ONLY)
Abs Immature Granulocytes: 0.02 10*3/uL (ref 0.00–0.07)
Basophils Absolute: 0.1 10*3/uL (ref 0.0–0.1)
Basophils Relative: 1 %
Eosinophils Absolute: 0 10*3/uL (ref 0.0–0.5)
Eosinophils Relative: 0 %
HCT: 32 % — ABNORMAL LOW (ref 36.0–46.0)
Hemoglobin: 9.9 g/dL — ABNORMAL LOW (ref 12.0–15.0)
Immature Granulocytes: 0 %
Lymphocytes Relative: 16 %
Lymphs Abs: 1.2 10*3/uL (ref 0.7–4.0)
MCH: 25.8 pg — ABNORMAL LOW (ref 26.0–34.0)
MCHC: 30.9 g/dL (ref 30.0–36.0)
MCV: 83.3 fL (ref 80.0–100.0)
Monocytes Absolute: 0.6 10*3/uL (ref 0.1–1.0)
Monocytes Relative: 8 %
Neutro Abs: 5.5 10*3/uL (ref 1.7–7.7)
Neutrophils Relative %: 75 %
Platelet Count: 357 10*3/uL (ref 150–400)
RBC: 3.84 MIL/uL — ABNORMAL LOW (ref 3.87–5.11)
RDW: 17.1 % — ABNORMAL HIGH (ref 11.5–15.5)
WBC Count: 7.4 10*3/uL (ref 4.0–10.5)
nRBC: 0 % (ref 0.0–0.2)

## 2020-08-05 LAB — CMP (CANCER CENTER ONLY)
ALT: <6 U/L (ref 0–44)
AST: 12 U/L — ABNORMAL LOW (ref 15–41)
Albumin: 3.4 g/dL — ABNORMAL LOW (ref 3.5–5.0)
Alkaline Phosphatase: 65 U/L (ref 38–126)
Anion gap: 7 (ref 5–15)
BUN: 16 mg/dL (ref 8–23)
CO2: 24 mmol/L (ref 22–32)
Calcium: 9 mg/dL (ref 8.9–10.3)
Chloride: 108 mmol/L (ref 98–111)
Creatinine: 0.7 mg/dL (ref 0.44–1.00)
GFR, Estimated: >60 mL/min (ref >60)
Glucose, Bld: 100 mg/dL — ABNORMAL HIGH (ref 70–99)
Potassium: 4.3 mmol/L (ref 3.5–5.1)
Sodium: 139 mmol/L (ref 135–145)
Total Bilirubin: 0.4 mg/dL (ref 0.3–1.2)
Total Protein: 6.8 g/dL (ref 6.5–8.1)

## 2020-08-05 LAB — ECHOCARDIOGRAM COMPLETE
AV Mean grad: 8 mmHg
Area-P 1/2: 2.54 cm2
S' Lateral: 2.75 cm

## 2020-08-05 MED ORDER — HEPARIN SOD (PORK) LOCK FLUSH 100 UNIT/ML IV SOLN
500.0000 [IU] | Freq: Once | INTRAVENOUS | Status: AC | PRN
  Administered 2020-08-05: 500 [IU]
  Filled 2020-08-05: qty 5

## 2020-08-05 MED ORDER — DIPHENHYDRAMINE HCL 25 MG PO CAPS
25.0000 mg | ORAL_CAPSULE | Freq: Once | ORAL | Status: AC
Start: 2020-08-05 — End: 2020-08-05
  Administered 2020-08-05: 25 mg via ORAL

## 2020-08-05 MED ORDER — SODIUM CHLORIDE 0.9 % IV SOLN
10.0000 mg | Freq: Once | INTRAVENOUS | Status: AC
  Administered 2020-08-05: 10 mg via INTRAVENOUS
  Filled 2020-08-05: qty 10

## 2020-08-05 MED ORDER — ACETAMINOPHEN 325 MG PO TABS
ORAL_TABLET | ORAL | Status: AC
  Filled 2020-08-05: qty 2

## 2020-08-05 MED ORDER — DIPHENHYDRAMINE HCL 25 MG PO CAPS
ORAL_CAPSULE | ORAL | Status: AC
  Filled 2020-08-05: qty 1

## 2020-08-05 MED ORDER — SODIUM CHLORIDE 0.9 % IV SOLN
50.0000 mg/m2 | Freq: Once | INTRAVENOUS | Status: AC
  Administered 2020-08-05: 80 mg via INTRAVENOUS
  Filled 2020-08-05: qty 8

## 2020-08-05 MED ORDER — TRASTUZUMAB-ANNS CHEMO 150 MG IV SOLR
6.0000 mg/kg | Freq: Once | INTRAVENOUS | Status: AC
  Administered 2020-08-05: 336 mg via INTRAVENOUS
  Filled 2020-08-05: qty 16

## 2020-08-05 MED ORDER — SODIUM CHLORIDE 0.9% FLUSH
10.0000 mL | INTRAVENOUS | Status: DC | PRN
  Administered 2020-08-05: 10 mL
  Filled 2020-08-05: qty 10

## 2020-08-05 MED ORDER — PERTUZUMAB CHEMO INJECTION 420 MG/14ML
420.0000 mg | Freq: Once | INTRAVENOUS | Status: AC
  Administered 2020-08-05: 420 mg via INTRAVENOUS
  Filled 2020-08-05: qty 14

## 2020-08-05 MED ORDER — SODIUM CHLORIDE 0.9 % IV SOLN
Freq: Once | INTRAVENOUS | Status: AC
  Filled 2020-08-05: qty 250

## 2020-08-05 MED ORDER — ACETAMINOPHEN 325 MG PO TABS
650.0000 mg | ORAL_TABLET | Freq: Once | ORAL | Status: AC
  Administered 2020-08-05: 650 mg via ORAL

## 2020-08-05 NOTE — Assessment & Plan Note (Addendum)
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 Neoadj chemo withTaxotere Herceptin Perjeta 2. Mastectomywith sentinel node biopsy 3. Adj XRT 4. Adj Anti estrogen therapy -------------------------------------------------------------------------------------------------- Current treatment: Cycle 5Taxotere Herceptin Perjeta  Echocardiogram 04/23/2020: EF 55%; echo 08/04/2020 shows EF 55% Chemo toxicities: 1.Mild nausea 2.Fatigue 3.Anemia is improving  She continues on boost supplements and is seeing our nutritionist.    I will work on sending in a nebulizer machine for her COPD.  She is not having acute symptoms, she is hoping to be able to use this in the evening instead of her inhaler.    I sent our navigator a message to get the patient in with Dr. Brantley Stage after she completes chemotherapy on 08/27/2020.    We will see her back in 3 weeks for her final chemotherapy cycle.  She knows to call for any questions that may arise between now and her next appointment.  We are happy to see her sooner if needed.

## 2020-08-05 NOTE — Progress Notes (Signed)
Nutrition Follow-up:  Patient with right breast cancer. She is receiving neoadjuvant chemotherapy with Taxotere/herceptin/Perjeta.  Met with patient in infusion. She reports doing well, denies nutrition impact symptoms. He appetite is good, reports eating 3 meals/day. Patient is drinking 1 Ensure/day, says would drink more but can not afford to purchase them. She is asking for additional complimentary case today.  She usually has scrambled eggs, bacon, toast for breakfast, drinks Ensure for snack, has a light lunch and meat, vegetable, starch for dinner. She usually has ice cream or fudge pop for dessert. Yesterday she did not have breakfast because of early doctor's appt. She had bowl of vegetable soup with glass of milk for lunch, and BBQ pork chop, pintos, sliced tomato/onions, cornbread for dinner. Patient reports she had 2 helpings.    Medications: reviewed  Labs: Glucose 100  Anthropometrics: Weight 120 lb 3.2 oz today stable  6/3 - 121 lb 5/13 - 123 lb 3.2 oz 4/22 - 122 lb 9.6 oz   NUTRITION DIAGNOSIS: Unintended weight loss stable   INTERVENTION:  Continue eating high calorie, high protein foods for weight maintenance Encouraged packing "snack bag" and keeping in purse  Continue drinking 1-2 Ensure Enlive/day Complimentary Ensure case provided today Discussed mixing part of supplement with ice cream for high calorie, high protein shakes Coupons given     MONITORING, EVALUATION, GOAL: weight trends, intake   NEXT VISIT: Friday July 15 in infusion

## 2020-08-05 NOTE — Patient Instructions (Signed)
[facility]  Discharge Instructions: Thank you for choosing [facility] to provide your oncology and hematology care.   If you have a lab appointment with the [facility], please go directly to the [facility] and check in at the registration area.   Wear comfortable clothing and clothing appropriate for easy access to any Portacath or PICC line.   We strive to give you quality time with your provider. You may need to reschedule your appointment if you arrive late (15 or more minutes).  Arriving late affects you and other patients whose appointments are after yours.  Also, if you miss three or more appointments without notifying the office, you may be dismissed from the clinic at the provider's discretion.      For prescription refill requests, have your pharmacy contact our office and allow 72 hours for refills to be completed.    Today you received the following chemotherapy and/or immunotherapy agents: Herceptin, Taxotere and Perjeta.    To help prevent nausea and vomiting after your treatment, we encourage you to take your nausea medication as directed.  BELOW ARE SYMPTOMS THAT SHOULD BE REPORTED IMMEDIATELY: *FEVER GREATER THAN 100.4 F (38 C) OR HIGHER *CHILLS OR SWEATING *NAUSEA AND VOMITING THAT IS NOT CONTROLLED WITH YOUR NAUSEA MEDICATION *UNUSUAL SHORTNESS OF BREATH *UNUSUAL BRUISING OR BLEEDING *URINARY PROBLEMS (pain or burning when urinating, or frequent urination) *BOWEL PROBLEMS (unusual diarrhea, constipation, pain near the anus) TENDERNESS IN MOUTH AND THROAT WITH OR WITHOUT PRESENCE OF ULCERS (sore throat, sores in mouth, or a toothache) UNUSUAL RASH, SWELLING OR PAIN  UNUSUAL VAGINAL DISCHARGE OR ITCHING   Items with * indicate a potential emergency and should be followed up as soon as possible or go to the Emergency Department if any problems should occur.  Please show the CHEMOTHERAPY ALERT CARD or IMMUNOTHERAPY ALERT  CARD at check-in to the Emergency Department and triage nurse.  Should you have questions after your visit or need to cancel or reschedule your appointment, please contact [facility]  Dept: [phone]  and follow the prompts.  Office hours are 8:00 a.m. to 4:30 p.m. Monday - Friday. Please note that voicemails left after 4:00 p.m. may not be returned until the following business day.  We are closed weekends and major holidays. You have access to a nurse at all times for urgent questions. Please call the main number to the clinic Dept: [phone] and follow the prompts.   For any non-urgent questions, you may also contact your provider using MyChart. We now offer e-Visits for anyone 50 and older to request care online for non-urgent symptoms. For details visit [email or web address].   Also download the MyChart app! Go to the app store, search "MyChart", open the app, select [facility], and log in with your MyChart username and password.  Due to Covid, a mask is required upon entering the hospital/clinic. If you do not have a mask, one will be given to you upon arrival. For doctor visits, patients may have 1 support person aged 57 or older with them. For treatment visits, patients cannot have anyone with them due to current Covid guidelines and our immunocompromised population.  Trastuzumab injection for infusion What is this medicine? TRASTUZUMAB (tras TOO zoo mab) is a monoclonal antibody. It is used to treat breast cancer and stomach cancer. This medicine may be used for other purposes; ask your health care provider or pharmacist if you have questions. COMMON BRAND NAME(S):  Herceptin, Donnald Garre What should I tell my health care provider before I take this medicine? They need to know if you have any of these conditions: heart disease heart failure lung or breathing disease, like asthma an unusual or allergic reaction to  trastuzumab, benzyl alcohol, or other medications, foods, dyes, or preservatives pregnant or trying to get pregnant breast-feeding How should I use this medicine? This drug is given as an infusion into a vein. It is administered in a hospital or clinic by a specially trained health care professional. Talk to your pediatrician regarding the use of this medicine in children. This medicine is not approved for use in children. Overdosage: If you think you have taken too much of this medicine contact a poison control center or emergency room at once. NOTE: This medicine is only for you. Do not share this medicine with others. What if I miss a dose? It is important not to miss a dose. Call your doctor or health care professional if you are unable to keep an appointment. What may interact with this medicine? This medicine may interact with the following medications: certain types of chemotherapy, such as daunorubicin, doxorubicin, epirubicin, and idarubicin This list may not describe all possible interactions. Give your health care provider a list of all the medicines, herbs, non-prescription drugs, or dietary supplements you use. Also tell them if you smoke, drink alcohol, or use illegal drugs. Some items may interact with your medicine. What should I watch for while using this medicine? Visit your doctor for checks on your progress. Report any side effects. Continue your course of treatment even though you feel ill unless your doctor tells you to stop. Call your doctor or health care professional for advice if you get a fever, chills or sore throat, or other symptoms of a cold or flu. Do not treat yourself. Try to avoid being around people who are sick. You may experience fever, chills and shaking during your first infusion. These effects are usually mild and can be treated with other medicines. Report any side effects during the infusion to your health care professional. Fever and chills usually do not  happen with later infusions. Do not become pregnant while taking this medicine or for 7 months after stopping it. Women should inform their doctor if they wish to become pregnant or think they might be pregnant. Women of child-bearing potential will need to have a negative pregnancy test before starting this medicine. There is a potential for serious side effects to an unborn child. Talk to your health care professional or pharmacist for more information. Do not breast-feed an infant while taking this medicine or for 7 months after stopping it. Women must use effective birth control with this medicine. What side effects may I notice from receiving this medicine? Side effects that you should report to your doctor or health care professional as soon as possible: allergic reactions like skin rash, itching or hives, swelling of the face, lips, or tongue chest pain or palpitations cough dizziness feeling faint or lightheaded, falls fever general ill feeling or flu-like symptoms signs of worsening heart failure like breathing problems; swelling in your legs and feet unusually weak or tired Side effects that usually do not require medical attention (report to your doctor or health care professional if they continue or are bothersome): bone pain changes in taste diarrhea joint pain nausea/vomiting weight loss This list may not describe all possible side effects. Call your doctor for medical advice about side  effects. You may report side effects to FDA at 1-800-FDA-1088. Where should I keep my medicine? This drug is given in a hospital or clinic and will not be stored at home. NOTE: This sheet is a summary. It may not cover all possible information. If you have questions about this medicine, talk to your doctor, pharmacist, or health care provider.  2021 Elsevier/Gold Standard (2016-01-24 14:37:52) Pertuzumab injection What is this medicine? PERTUZUMAB (per TOOZ ue mab) is a monoclonal antibody.  It is used to treat breast cancer. This medicine may be used for other purposes; ask your health care provider or pharmacist if you have questions. COMMON BRAND NAME(S): PERJETA What should I tell my health care provider before I take this medicine? They need to know if you have any of these conditions: heart disease heart failure high blood pressure history of irregular heart beat recent or ongoing radiation therapy an unusual or allergic reaction to pertuzumab, other medicines, foods, dyes, or preservatives pregnant or trying to get pregnant breast-feeding How should I use this medicine? This medicine is for infusion into a vein. It is given by a health care professional in a hospital or clinic setting. Talk to your pediatrician regarding the use of this medicine in children. Special care may be needed. Overdosage: If you think you have taken too much of this medicine contact a poison control center or emergency room at once. NOTE: This medicine is only for you. Do not share this medicine with others. What if I miss a dose? It is important not to miss your dose. Call your doctor or health care professional if you are unable to keep an appointment. What may interact with this medicine? Interactions are not expected. Give your health care provider a list of all the medicines, herbs, non-prescription drugs, or dietary supplements you use. Also tell them if you smoke, drink alcohol, or use illegal drugs. Some items may interact with your medicine. This list may not describe all possible interactions. Give your health care provider a list of all the medicines, herbs, non-prescription drugs, or dietary supplements you use. Also tell them if you smoke, drink alcohol, or use illegal drugs. Some items may interact with your medicine. What should I watch for while using this medicine? Your condition will be monitored carefully while you are receiving this medicine. Report any side effects. Continue  your course of treatment even though you feel ill unless your doctor tells you to stop. Do not become pregnant while taking this medicine or for 7 months after stopping it. Women should inform their doctor if they wish to become pregnant or think they might be pregnant. Women of child-bearing potential will need to have a negative pregnancy test before starting this medicine. There is a potential for serious side effects to an unborn child. Talk to your health care professional or pharmacist for more information. Do not breast-feed an infant while taking this medicine or for 7 months after stopping it. Women must use effective birth control with this medicine. Call your doctor or health care professional for advice if you get a fever, chills or sore throat, or other symptoms of a cold or flu. Do not treat yourself. Try to avoid being around people who are sick. You may experience fever, chills, and headache during the infusion. Report any side effects during the infusion to your health care professional. What side effects may I notice from receiving this medicine? Side effects that you should report to your doctor or health care  professional as soon as possible: breathing problems chest pain or palpitations dizziness feeling faint or lightheaded fever or chills skin rash, itching or hives sore throat swelling of the face, lips, or tongue swelling of the legs or ankles unusually weak or tired Side effects that usually do not require medical attention (report to your doctor or health care professional if they continue or are bothersome): diarrhea hair loss nausea, vomiting tiredness This list may not describe all possible side effects. Call your doctor for medical advice about side effects. You may report side effects to FDA at 1-800-FDA-1088. Where should I keep my medicine? This drug is given in a hospital or clinic and will not be stored at home. NOTE: This sheet is a summary. It may not  cover all possible information. If you have questions about this medicine, talk to your doctor, pharmacist, or health care provider.  2021 Elsevier/Gold Standard (2015-03-03 12:08:50) Docetaxel injection What is this medicine? DOCETAXEL (doe se TAX el) is a chemotherapy drug. It targets fast dividing cells, like cancer cells, and causes these cells to die. This medicine is used to treat many types of cancers like breast cancer, certain stomach cancers, head and neck cancer, lung cancer, and prostate cancer. This medicine may be used for other purposes; ask your health care provider or pharmacist if you have questions. COMMON BRAND NAME(S): Docefrez, Taxotere What should I tell my health care provider before I take this medicine? They need to know if you have any of these conditions: infection (especially a virus infection such as chickenpox, cold sores, or herpes) liver disease low blood counts, like low white cell, platelet, or red cell counts an unusual or allergic reaction to docetaxel, polysorbate 80, other chemotherapy agents, other medicines, foods, dyes, or preservatives pregnant or trying to get pregnant breast-feeding How should I use this medicine? This drug is given as an infusion into a vein. It is administered in a hospital or clinic by a specially trained health care professional. Talk to your pediatrician regarding the use of this medicine in children. Special care may be needed. Overdosage: If you think you have taken too much of this medicine contact a poison control center or emergency room at once. NOTE: This medicine is only for you. Do not share this medicine with others. What if I miss a dose? It is important not to miss your dose. Call your doctor or health care professional if you are unable to keep an appointment. What may interact with this medicine? Do not take this medicine with any of the following medications: live virus vaccines This medicine may also interact  with the following medications: aprepitant certain antibiotics like erythromycin or clarithromycin certain antivirals for HIV or hepatitis certain medicines for fungal infections like fluconazole, itraconazole, ketoconazole, posaconazole, or voriconazole cimetidine ciprofloxacin conivaptan cyclosporine dronedarone fluvoxamine grapefruit juice imatinib verapamil This list may not describe all possible interactions. Give your health care provider a list of all the medicines, herbs, non-prescription drugs, or dietary supplements you use. Also tell them if you smoke, drink alcohol, or use illegal drugs. Some items may interact with your medicine. What should I watch for while using this medicine? Your condition will be monitored carefully while you are receiving this medicine. You will need important blood work done while you are taking this medicine. Call your doctor or health care professional for advice if you get a fever, chills or sore throat, or other symptoms of a cold or flu. Do not treat yourself. This drug  decreases your body's ability to fight infections. Try to avoid being around people who are sick. Some products may contain alcohol. Ask your health care professional if this medicine contains alcohol. Be sure to tell all health care professionals you are taking this medicine. Certain medicines, like metronidazole and disulfiram, can cause an unpleasant reaction when taken with alcohol. The reaction includes flushing, headache, nausea, vomiting, sweating, and increased thirst. The reaction can last from 30 minutes to several hours. You may get drowsy or dizzy. Do not drive, use machinery, or do anything that needs mental alertness until you know how this medicine affects you. Do not stand or sit up quickly, especially if you are an older patient. This reduces the risk of dizzy or fainting spells. Alcohol may interfere with the effect of this medicine. Talk to your health care professional  about your risk of cancer. You may be more at risk for certain types of cancer if you take this medicine. Do not become pregnant while taking this medicine or for 6 months after stopping it. Women should inform their doctor if they wish to become pregnant or think they might be pregnant. There is a potential for serious side effects to an unborn child. Talk to your health care professional or pharmacist for more information. Do not breast-feed an infant while taking this medicine or for 1 week after stopping it. Males who get this medicine must use a condom during sex with females who can get pregnant. If you get a woman pregnant, the baby could have birth defects. The baby could die before they are born. You will need to continue wearing a condom for 3 months after stopping the medicine. Tell your health care provider right away if your partner becomes pregnant while you are taking this medicine. This may interfere with the ability to father a child. You should talk to your doctor or health care professional if you are concerned about your fertility. What side effects may I notice from receiving this medicine? Side effects that you should report to your doctor or health care professional as soon as possible: allergic reactions like skin rash, itching or hives, swelling of the face, lips, or tongue blurred vision breathing problems changes in vision low blood counts - This drug may decrease the number of white blood cells, red blood cells and platelets. You may be at increased risk for infections and bleeding. nausea and vomiting pain, redness or irritation at site where injected pain, tingling, numbness in the hands or feet redness, blistering, peeling, or loosening of the skin, including inside the mouth signs of decreased platelets or bleeding - bruising, pinpoint red spots on the skin, black, tarry stools, nosebleeds signs of decreased red blood cells - unusually weak or tired, fainting spells,  lightheadedness signs of infection - fever or chills, cough, sore throat, pain or difficulty passing urine swelling of the ankle, feet, hands Side effects that usually do not require medical attention (report to your doctor or health care professional if they continue or are bothersome): constipation diarrhea fingernail or toenail changes hair loss loss of appetite mouth sores muscle pain This list may not describe all possible side effects. Call your doctor for medical advice about side effects. You may report side effects to FDA at 1-800-FDA-1088. Where should I keep my medicine? This drug is given in a hospital or clinic and will not be stored at home. NOTE: This sheet is a summary. It may not cover all possible information. If you have  questions about this medicine, talk to your doctor, pharmacist, or health care provider.  2021 Elsevier/Gold Standard (2018-12-29 19:50:31)

## 2020-08-05 NOTE — Progress Notes (Signed)
[name] Cancer Follow up:    Pcp, No No address on file   DIAGNOSIS: Cancer Staging Malignant neoplasm of right breast in female, estrogen receptor positive (HCC) Staging form: Breast, AJCC 8th Edition - Clinical stage from 04/18/2020: Stage IIIB (cT4b, cN1, cM0, G3, ER+, PR+, HER2+) - Signed by Loa Socks, NP on 04/27/2020 Stage prefix: Initial diagnosis Histologic grading system: 3 grade system   SUMMARY OF ONCOLOGIC HISTORY: Oncology History  Malignant neoplasm of right breast in female, estrogen receptor positive (HCC)  04/18/2020 Initial Diagnosis   Patient presented to the ED with complaint of syncope and found to have a fungating right breast mass. CT showed a right breast mass, 8.1cm, and right axillary adenopathy. Patient reported a right breast mass present for past 2-3 years. Biopsy showed IDC, grade 3, HER-2 equivocal by IHC, positive by FISH (ratio 3.56), ER+ 95%, PR+ 70%, ,Ki67 25%.    04/18/2020 Cancer Staging   Staging form: Breast, AJCC 8th Edition - Clinical stage from 04/18/2020: Stage IIIB (cT4b, cN1, cM0, G3, ER+, PR+, HER2+) - Signed by Loa Socks, NP on 04/27/2020  Stage prefix: Initial diagnosis  Histologic grading system: 3 grade system    05/13/2020 -  Chemotherapy    Patient is on Treatment Plan: BREAST DOCETAXEL + TRASTUZUMAB + PERTUZUMAB (THP) Q21D         CURRENT THERAPY: THP  INTERVAL HISTORY: Elizabeth Chase 80 y.o. female returns for evaluation of her right breast cancer prior to receiving cycle 5 of THP.  She is tolerating it well, other than some diarrhea that she manages with imodium.  She denies nausea/vomiting.  She denies any dehydration.  She drinks water and ensure to stay on top of her intake.  Her daughter reports that the tumor is almost completely resolved.    She underwent echocardiogram on 08/04/2020 that showed an EF of 55-60%   Patient Active Problem List   Diagnosis Date Noted   COPD  (chronic obstructive pulmonary disease) (HCC) 08/05/2020   Port-A-Cath in place 05/13/2020   Breast wound 05/13/2020   Homelessness 05/13/2020   Malignant neoplasm of right breast in female, estrogen receptor positive (HCC)    Syncope and collapse 04/15/2020   Breast mass, right 04/15/2020   Prolonged QT interval 04/15/2020   AKI (acute kidney injury) (HCC) 04/15/2020   Colitis 04/15/2020   COVID-19 virus infection 04/15/2020   Hyperlipidemia    Abnormal brain MRI    Meningioma (HCC)    Right thalamic infarction (HCC) 01/01/2017   Essential hypertension     is allergic to bee venom.  MEDICAL HISTORY: Past Medical History:  Diagnosis Date   Abnormal brain MRI    Abnormal glucose level    AKI (acute kidney injury) (HCC) 04/15/2020   Anemia    Blood pressure abnormally low    Breast wound 05/13/2020   Colitis 04/15/2020   COVID-19 virus infection 04/15/2020   Essential hypertension    Homelessness 05/13/2020   Hyperlipidemia    Hypertension    Hypocalcemia    Malignant neoplasm of right breast in female, estrogen receptor positive (HCC)    Muscle weakness    Neoplasm of breast, female, malignant (HCC)    Port-A-Cath in place 05/13/2020   Prolonged QT interval 04/15/2020   Right thalamic infarction (HCC) 01/01/2017   Stroke (HCC)    Syncope and collapse 04/15/2020   Systolic murmur     SURGICAL HISTORY: Past Surgical History:  Procedure Laterality Date  IR IMAGING GUIDED PORT INSERTION  04/26/2020   IR US GUIDE VASC ACCESS RIGHT  04/26/2020    SOCIAL HISTORY: Social History   Socioeconomic History   Marital status: Widowed    Spouse name: Not on file   Number of children: Not on file   Years of education: Not on file   Highest education level: Not on file  Occupational History   Not on file  Tobacco Use   Smoking status: Former    Packs/day: 1.00    Pack years: 0.00    Types: Cigarettes    Quit date: 12/31/2016    Years since quitting: 3.5   Smokeless tobacco: Never   Vaping Use   Vaping Use: Not on file  Substance and Sexual Activity   Alcohol use: No   Drug use: No   Sexual activity: Not on file  Other Topics Concern   Not on file  Social History Narrative   Not on file   Social Determinants of Health   Financial Resource Strain: High Risk   Difficulty of Paying Living Expenses: Very hard  Food Insecurity: Food Insecurity Present   Worried About Programme researcher, broadcasting/film/video in the Last Year: Often true   Barista in the Last Year: Not on file  Transportation Needs: Unmet Transportation Needs   Lack of Transportation (Medical): Yes   Lack of Transportation (Non-Medical): No  Physical Activity: Not on file  Stress: Not on file  Social Connections: Not on file  Intimate Partner Violence: Not on file    FAMILY HISTORY: Family History  Problem Relation Age of Onset   Diabetes Mother    Cancer Father    Lung cancer Father    Breast cancer Sister    Diabetes Sister    Lung cancer Son    Cancer Brother    Diabetes Brother     Review of Systems  Constitutional:  Positive for fatigue. Negative for appetite change, chills, fever and unexpected weight change.  HENT:   Negative for hearing loss, lump/mass and trouble swallowing.   Eyes:  Negative for eye problems and icterus.  Respiratory:  Negative for chest tightness, cough and shortness of breath.   Cardiovascular:  Negative for chest pain, leg swelling and palpitations.  Gastrointestinal:  Positive for diarrhea (per interval history). Negative for abdominal distention, abdominal pain, constipation, nausea and vomiting.  Endocrine: Negative for hot flashes.  Genitourinary:  Negative for difficulty urinating.   Musculoskeletal:  Negative for arthralgias.  Skin:  Negative for itching and rash.  Neurological:  Negative for dizziness, extremity weakness, headaches and numbness.  Hematological:  Negative for adenopathy. Does not bruise/bleed easily.  Psychiatric/Behavioral:  Negative for  depression. The patient is not nervous/anxious.      PHYSICAL EXAMINATION  ECOG PERFORMANCE STATUS: 1 - Symptomatic but completely ambulatory  Vitals:   08/05/20 1015  BP: (!) 180/72  Pulse: 67  Resp: 19  Temp: 97.7 F (36.5 C)  SpO2: 98%    Physical Exam Constitutional:      General: She is not in acute distress.    Appearance: Normal appearance. She is not toxic-appearing.  HENT:     Head: Normocephalic and atraumatic.  Eyes:     General: No scleral icterus. Cardiovascular:     Rate and Rhythm: Normal rate and regular rhythm.     Pulses: Normal pulses.     Heart sounds: Normal heart sounds.  Pulmonary:     Effort: Pulmonary effort is normal.  Breath sounds: Normal breath sounds.  Abdominal:     General: Abdomen is flat. Bowel sounds are normal. There is no distension.     Palpations: Abdomen is soft.     Tenderness: There is no abdominal tenderness.  Musculoskeletal:        General: No swelling.     Cervical back: Neck supple.  Lymphadenopathy:     Cervical: No cervical adenopathy.  Skin:    General: Skin is warm and dry.     Findings: No rash.  Neurological:     General: No focal deficit present.     Mental Status: She is alert.  Psychiatric:        Mood and Affect: Mood normal.        Behavior: Behavior normal.    LABORATORY DATA:  CBC    Component Value Date/Time   WBC 7.4 08/05/2020 1003   WBC 6.1 04/24/2020 0137   RBC 3.84 (L) 08/05/2020 1003   HGB 9.9 (L) 08/05/2020 1003   HCT 32.0 (L) 08/05/2020 1003   PLT 357 08/05/2020 1003   MCV 83.3 08/05/2020 1003   MCH 25.8 (L) 08/05/2020 1003   MCHC 30.9 08/05/2020 1003   RDW 17.1 (H) 08/05/2020 1003   LYMPHSABS 1.2 08/05/2020 1003   MONOABS 0.6 08/05/2020 1003   EOSABS 0.0 08/05/2020 1003   BASOSABS 0.1 08/05/2020 1003    CMP     Component Value Date/Time   NA 139 08/05/2020 1003   K 4.3 08/05/2020 1003   CL 108 08/05/2020 1003   CO2 24 08/05/2020 1003   GLUCOSE 100 (H) 08/05/2020  1003   BUN 16 08/05/2020 1003   CREATININE 0.70 08/05/2020 1003   CALCIUM 9.0 08/05/2020 1003   PROT 6.8 08/05/2020 1003   ALBUMIN 3.4 (L) 08/05/2020 1003   AST 12 (L) 08/05/2020 1003   ALT <6 08/05/2020 1003   ALKPHOS 65 08/05/2020 1003   BILITOT 0.4 08/05/2020 1003   GFRNONAA >60 08/05/2020 1003   GFRAA >60 01/02/2017 0228       ASSESSMENT and PLAN:   Malignant neoplasm of right breast in female, estrogen receptor positive ([place]) Stage IIIc fungating tumor of the right breast: ER/PR and HER-2 positive 04/15/20: CT CAP: No distant mets   Treatment Plan: 1. Neoadj chemo with Taxotere Herceptin Perjeta 2. Mastectomy with sentinel node biopsy 3. Adj XRT 4. Adj Anti estrogen therapy -------------------------------------------------------------------------------------------------- Current treatment: Cycle 5 Taxotere Herceptin Perjeta   Echocardiogram 04/23/2020: EF 55%; echo 08/04/2020 shows EF 55% Chemo toxicities: 1.  Mild nausea 2. Fatigue 3.  Anemia is improving  She continues on boost supplements and is seeing our nutritionist.    I will work on sending in a nebulizer machine for her COPD.  She is not having acute symptoms, she is hoping to be able to use this in the evening instead of her inhaler.    I sent our navigator a message to get the patient in with Dr. Brantley Stage after she completes chemotherapy on 08/27/2020.    We will see her back in 3 weeks for her final chemotherapy cycle.  She knows to call for any questions that may arise between now and her next appointment.  We are happy to see her sooner if needed.   She knows to call for any questions that may arise between now and her next appointment.  We are happy to see her sooner if needed.  Total encounter time: 30 minutes* in chart review, lab review, face to face  visit time, order entry, and documentation of the encounter.   Wilber Bihari, NP 08/05/20 1:39 PM Medical Oncology and Hematology [facility] [place], [place] [address] Tel. [phone]    Fax. [phone]  *Total Encounter Time as defined by the Centers for Medicare and Medicaid Services includes, in addition to the face-to-face time of a patient visit (documented in the note above) non-face-to-face time: obtaining and reviewing outside history, ordering and reviewing medications, tests or procedures, care coordination (communications with other health care professionals or caregivers) and documentation in the medical record.

## 2020-08-05 NOTE — Patient Instructions (Signed)
Implanted Port Home Guide An implanted port is a device that is placed under the skin. It is usually placed in the chest. The device can be used to give IV medicine, to take blood, or for dialysis. You may have an implanted port if: You need IV medicine that would be irritating to the small veins in your hands or arms. You need IV medicines, such as antibiotics, for a long period of time. You need IV nutrition for a long period of time. You need dialysis. When you have a port, your health care provider can choose to use the port instead of veins in your arms for these procedures. You may have fewer limitations when using a port than you would if you used other types of long-term IVs, and you will likely be able to return to normal activities afteryour incision heals. An implanted port has two main parts: Reservoir. The reservoir is the part where a needle is inserted to give medicines or draw blood. The reservoir is round. After it is placed, it appears as a small, raised area under your skin. Catheter. The catheter is a thin, flexible tube that connects the reservoir to a vein. Medicine that is inserted into the reservoir goes into the catheter and then into the vein. How is my port accessed? To access your port: A numbing cream may be placed on the skin over the port site. Your health care provider will put on a mask and sterile gloves. The skin over your port will be cleaned carefully with a germ-killing soap and allowed to dry. Your health care provider will gently pinch the port and insert a needle into it. Your health care provider will check for a blood return to make sure the port is in the vein and is not clogged. If your port needs to remain accessed to get medicine continuously (constant infusion), your health care provider will place a clear bandage (dressing) over the needle site. The dressing and needle will need to be changed every week, or as told by your health care provider. What  is flushing? Flushing helps keep the port from getting clogged. Follow instructions from your health care provider about how and when to flush the port. Ports are usually flushed with saline solution or a medicine called heparin. The need for flushing will depend on how the port is used: If the port is only used from time to time to give medicines or draw blood, the port may need to be flushed: Before and after medicines have been given. Before and after blood has been drawn. As part of routine maintenance. Flushing may be recommended every 4-6 weeks. If a constant infusion is running, the port may not need to be flushed. Throw away any syringes in a disposal container that is meant for sharp items (sharps container). You can buy a sharps container from a pharmacy, or you can make one by using an empty hard plastic bottle with a cover. How long will my port stay implanted? The port can stay in for as long as your health care provider thinks it is needed. When it is time for the port to come out, a surgery will be done to remove it. The surgery will be similar to the procedure that was done to putthe port in. Follow these instructions at home:  Flush your port as told by your health care provider. If you need an infusion over several days, follow instructions from your health care provider about how to take   care of your port site. Make sure you: Wash your hands with soap and water before you change your dressing. If soap and water are not available, use alcohol-based hand sanitizer. Change your dressing as told by your health care provider. Place any used dressings or infusion bags into a plastic bag. Throw that bag in the trash. Keep the dressing that covers the needle clean and dry. Do not get it wet. Do not use scissors or sharp objects near the tube. Keep the tube clamped, unless it is being used. Check your port site every day for signs of infection. Check for: Redness, swelling, or  pain. Fluid or blood. Pus or a bad smell. Protect the skin around the port site. Avoid wearing bra straps that rub or irritate the site. Protect the skin around your port from seat belts. Place a soft pad over your chest if needed. Bathe or shower as told by your health care provider. The site may get wet as long as you are not actively receiving an infusion. Return to your normal activities as told by your health care provider. Ask your health care provider what activities are safe for you. Carry a medical alert card or wear a medical alert bracelet at all times. This will let health care providers know that you have an implanted port in case of an emergency. Get help right away if: You have redness, swelling, or pain at the port site. You have fluid or blood coming from your port site. You have pus or a bad smell coming from the port site. You have a fever. Summary Implanted ports are usually placed in the chest for long-term IV access. Follow instructions from your health care provider about flushing the port and changing bandages (dressings). Take care of the area around your port by avoiding clothing that puts pressure on the area, and by watching for signs of infection. Protect the skin around your port from seat belts. Place a soft pad over your chest if needed. Get help right away if you have a fever or you have redness, swelling, pain, drainage, or a bad smell at the port site. This information is not intended to replace advice given to you by your health care provider. Make sure you discuss any questions you have with your healthcare provider. Document Revised: 06/15/2019 Document Reviewed: 06/15/2019 Elsevier Patient Education  2022 Elsevier Inc.  

## 2020-08-05 NOTE — Progress Notes (Signed)
Nebulizer machine ordered for patient use at home.

## 2020-08-08 ENCOUNTER — Inpatient Hospital Stay: Payer: Medicare HMO

## 2020-08-08 ENCOUNTER — Other Ambulatory Visit: Payer: Self-pay

## 2020-08-08 VITALS — BP 131/59 | HR 101 | Temp 98.3°F | Resp 18

## 2020-08-08 DIAGNOSIS — R197 Diarrhea, unspecified: Secondary | ICD-10-CM | POA: Diagnosis not present

## 2020-08-08 DIAGNOSIS — C50911 Malignant neoplasm of unspecified site of right female breast: Secondary | ICD-10-CM

## 2020-08-08 DIAGNOSIS — Z5112 Encounter for antineoplastic immunotherapy: Secondary | ICD-10-CM | POA: Diagnosis not present

## 2020-08-08 DIAGNOSIS — D649 Anemia, unspecified: Secondary | ICD-10-CM | POA: Diagnosis not present

## 2020-08-08 DIAGNOSIS — N179 Acute kidney failure, unspecified: Secondary | ICD-10-CM | POA: Diagnosis not present

## 2020-08-08 DIAGNOSIS — Z5111 Encounter for antineoplastic chemotherapy: Secondary | ICD-10-CM | POA: Diagnosis not present

## 2020-08-08 DIAGNOSIS — Z5189 Encounter for other specified aftercare: Secondary | ICD-10-CM | POA: Diagnosis not present

## 2020-08-08 DIAGNOSIS — R5383 Other fatigue: Secondary | ICD-10-CM | POA: Diagnosis not present

## 2020-08-08 DIAGNOSIS — Z170 Estrogen receptor positive status [ER+]: Secondary | ICD-10-CM | POA: Diagnosis not present

## 2020-08-08 MED ORDER — PEGFILGRASTIM-CBQV 6 MG/0.6ML ~~LOC~~ SOSY
PREFILLED_SYRINGE | SUBCUTANEOUS | Status: AC
  Filled 2020-08-08: qty 0.6

## 2020-08-08 MED ORDER — PEGFILGRASTIM-CBQV 6 MG/0.6ML ~~LOC~~ SOSY
6.0000 mg | PREFILLED_SYRINGE | Freq: Once | SUBCUTANEOUS | Status: AC
  Administered 2020-08-08: 6 mg via SUBCUTANEOUS

## 2020-08-08 NOTE — Patient Instructions (Signed)

## 2020-08-12 ENCOUNTER — Encounter: Payer: Self-pay | Admitting: *Deleted

## 2020-08-12 ENCOUNTER — Other Ambulatory Visit: Payer: Self-pay | Admitting: Hematology and Oncology

## 2020-08-16 ENCOUNTER — Encounter: Payer: Self-pay | Admitting: *Deleted

## 2020-08-23 ENCOUNTER — Encounter: Payer: Self-pay | Admitting: Hematology and Oncology

## 2020-08-25 NOTE — Progress Notes (Signed)
Patient Care Team: Pcp, No as PCP - General [name], MD as PCP - Cardiology (Cardiology) Mauro Kaufmann, RN as Oncology Nurse Navigator Rockwell Germany, RN as Oncology Nurse Navigator Erroll Luna, MD as Consulting Physician (General Surgery) Nicholas Lose, MD as Consulting Physician (Hematology and Oncology)  DIAGNOSIS:    ICD-10-CM   1. Malignant neoplasm of right breast in female, estrogen receptor positive, unspecified site of breast ([place])  C50.911    Z17.0       SUMMARY OF ONCOLOGIC HISTORY: Oncology History  Malignant neoplasm of right breast in female, estrogen receptor positive ([place])  04/18/2020 Initial Diagnosis   Patient presented to the ED with complaint of syncope and found to have a fungating right breast mass. CT showed a right breast mass, 8.1cm, and right axillary adenopathy. Patient reported a right breast mass present for past 2-3 years. Biopsy showed IDC, grade 3, HER-2 equivocal by IHC, positive by FISH (ratio 3.56), ER+ 95%, PR+ 70%, ,Ki67 25%.    04/18/2020 Cancer Staging   Staging form: Breast, AJCC 8th Edition - Clinical stage from 04/18/2020: Stage IIIB (cT4b, cN1, cM0, G3, ER+, PR+, HER2+) - Signed by Gardenia Phlegm, NP on 04/27/2020  Stage prefix: Initial diagnosis  Histologic grading system: 3 grade system    05/13/2020 -  Chemotherapy    Patient is on Treatment Plan: BREAST DOCETAXEL + TRASTUZUMAB + PERTUZUMAB (THP) Q21D         CHIEF COMPLIANT: Cycle 6 TCHP  INTERVAL HISTORY: Elizabeth Chase is a 80 y.o. with above-mentioned history of HER-2 positive right breast cancer currently on neoadjuvant chemotherapy with Taxotere Herceptin Perjeta. She presents to the clinic today for cycle 6.   ALLERGIES:  is allergic to bee venom.  MEDICATIONS:  Current Outpatient Medications  Medication Sig Dispense Refill   albuterol (VENTOLIN HFA) 108 (90 Base) MCG/ACT inhaler TAKE 2 PUFFS BY MOUTH EVERY 6 HOURS AS NEEDED FOR WHEEZE OR  SHORTNESS OF BREATH 8.5 each 2   atorvastatin (LIPITOR) 20 MG tablet Take 1 tablet (20 mg total) by mouth daily at 6 PM. 30 tablet 0   cyanocobalamin 1000 MCG tablet Take 1,000 mcg by mouth daily.     ergocalciferol (VITAMIN D2) 1.25 MG (50000 UT) capsule Take 50,000 Units by mouth once a week.     feeding supplement (BOOST HIGH PROTEIN) LIQD Take 237 mLs by mouth 3 (three) times daily between meals. Please give her 90 bottles 237 mL 6   lidocaine-prilocaine (EMLA) cream Apply to affected area once 30 g 3   loratadine (CLARITIN) 10 MG tablet Take 10 mg by mouth daily.     nystatin (MYCOSTATIN) 100000 UNIT/ML suspension TAKE 5 MLS (500,000 UNITS TOTAL) BY MOUTH 4 (FOUR) TIMES DAILY. 60 mL 0   nystatin (MYCOSTATIN/NYSTOP) powder Apply 1 application topically 3 (three) times daily.     ondansetron (ZOFRAN) 8 MG tablet Take 1 tablet (8 mg total) by mouth 2 (two) times daily as needed for refractory nausea / vomiting. 30 tablet 1   prochlorperazine (COMPAZINE) 10 MG tablet Take 1 tablet (10 mg total) by mouth every 6 (six) hours as needed (Nausea or vomiting). 30 tablet 1   trazodone (DESYREL) 300 MG tablet Take 300 mg by mouth at bedtime.     No current facility-administered medications for this visit.    PHYSICAL EXAMINATION: ECOG PERFORMANCE STATUS: 1 - Symptomatic but completely ambulatory  There were no vitals filed for this visit. There were no vitals filed  for this visit.  LABORATORY DATA:  I have reviewed the data as listed CMP Latest Ref Rng & Units 08/05/2020 07/15/2020 06/24/2020  Glucose 70 - 99 mg/dL 100(H) 148(H) 93  BUN 8 - 23 mg/dL $Remove'16 18 16  'VyhrvIN$ Creatinine 0.44 - 1.00 mg/dL 0.70 0.76 0.76  Sodium 135 - 145 mmol/L 139 142 140  Potassium 3.5 - 5.1 mmol/L 4.3 3.9 4.1  Chloride 98 - 111 mmol/L 108 106 107  CO2 22 - 32 mmol/L $RemoveB'24 26 28  'nNNHgfmt$ Calcium 8.9 - 10.3 mg/dL 9.0 8.7(L) 8.8(L)  Total Protein 6.5 - 8.1 g/dL 6.8 6.5 6.6  Total Bilirubin 0.3 - 1.2 mg/dL 0.4 0.4 0.3  Alkaline Phos 38 -  126 U/L 65 67 67  AST 15 - 41 U/L 12(L) 11(L) 14(L)  ALT 0 - 44 U/L <6 <6 7    Lab Results  Component Value Date   WBC 7.4 08/05/2020   HGB 9.9 (L) 08/05/2020   HCT 32.0 (L) 08/05/2020   MCV 83.3 08/05/2020   PLT 357 08/05/2020   NEUTROABS 5.5 08/05/2020    ASSESSMENT & PLAN:  Malignant neoplasm of right breast in female, estrogen receptor positive (HCC) Stage IIIc fungating tumor of the right breast: ER/PR and HER-2 positive 04/15/20: CT CAP: No distant mets   Treatment Plan: 1. Neoadj chemo with Taxotere Herceptin Perjeta 2. Mastectomy with sentinel node biopsy 3. Adj XRT 4. Adj Anti estrogen therapy -------------------------------------------------------------------------------------------------- Current treatment: Cycle 6 Taxotere Herceptin Perjeta   Echocardiogram 04/23/2020: EF 55% Chemo toxicities: 1.  Mild nausea 2. Fatigue 3.  Anemia: Probably due to bleeding from the tumor, stable    Patient will see surgery for discussion regarding mastectomy Poor nutrition: Seeing a dietitian today. Once surgery is completed then we will discuss between Herceptin Perjeta maintenance versus Kadcyla maintenance. Return to clinic in 3 weeks for Herceptin and Perjeta      No orders of the defined types were placed in this encounter.  The patient has a good understanding of the overall plan. she agrees with it. she will call with any problems that may develop before the next visit here.  Total time spent: 20 mins including face to face time and time spent for planning, charting and coordination of care  Rulon Eisenmenger, MD, MPH 08/26/2020  I, Thana Ates, am acting as scribe for Dr. Nicholas Lose.  I have reviewed the above documentation for accuracy and completeness, and I agree with the above.

## 2020-08-26 ENCOUNTER — Ambulatory Visit: Payer: Medicare HMO | Admitting: Dietician

## 2020-08-26 ENCOUNTER — Other Ambulatory Visit: Payer: Self-pay

## 2020-08-26 ENCOUNTER — Encounter: Payer: Self-pay | Admitting: *Deleted

## 2020-08-26 ENCOUNTER — Inpatient Hospital Stay: Payer: Medicare HMO

## 2020-08-26 ENCOUNTER — Inpatient Hospital Stay: Payer: Medicare HMO | Attending: Hematology and Oncology

## 2020-08-26 ENCOUNTER — Inpatient Hospital Stay (HOSPITAL_BASED_OUTPATIENT_CLINIC_OR_DEPARTMENT_OTHER): Payer: Medicare HMO | Admitting: Hematology and Oncology

## 2020-08-26 DIAGNOSIS — C50911 Malignant neoplasm of unspecified site of right female breast: Secondary | ICD-10-CM | POA: Diagnosis not present

## 2020-08-26 DIAGNOSIS — Z170 Estrogen receptor positive status [ER+]: Secondary | ICD-10-CM | POA: Diagnosis not present

## 2020-08-26 DIAGNOSIS — Z5112 Encounter for antineoplastic immunotherapy: Secondary | ICD-10-CM | POA: Diagnosis not present

## 2020-08-26 DIAGNOSIS — Z95828 Presence of other vascular implants and grafts: Secondary | ICD-10-CM

## 2020-08-26 DIAGNOSIS — Z5111 Encounter for antineoplastic chemotherapy: Secondary | ICD-10-CM | POA: Diagnosis not present

## 2020-08-26 DIAGNOSIS — Z5189 Encounter for other specified aftercare: Secondary | ICD-10-CM | POA: Insufficient documentation

## 2020-08-26 LAB — CBC WITH DIFFERENTIAL (CANCER CENTER ONLY)
Abs Immature Granulocytes: 0.01 10*3/uL (ref 0.00–0.07)
Basophils Absolute: 0 10*3/uL (ref 0.0–0.1)
Basophils Relative: 1 %
Eosinophils Absolute: 0.1 10*3/uL (ref 0.0–0.5)
Eosinophils Relative: 1 %
HCT: 30 % — ABNORMAL LOW (ref 36.0–46.0)
Hemoglobin: 9.6 g/dL — ABNORMAL LOW (ref 12.0–15.0)
Immature Granulocytes: 0 %
Lymphocytes Relative: 23 %
Lymphs Abs: 1.2 10*3/uL (ref 0.7–4.0)
MCH: 26 pg (ref 26.0–34.0)
MCHC: 32 g/dL (ref 30.0–36.0)
MCV: 81.3 fL (ref 80.0–100.0)
Monocytes Absolute: 0.5 10*3/uL (ref 0.1–1.0)
Monocytes Relative: 10 %
Neutro Abs: 3.5 10*3/uL (ref 1.7–7.7)
Neutrophils Relative %: 65 %
Platelet Count: 292 10*3/uL (ref 150–400)
RBC: 3.69 MIL/uL — ABNORMAL LOW (ref 3.87–5.11)
RDW: 18.5 % — ABNORMAL HIGH (ref 11.5–15.5)
WBC Count: 5.3 10*3/uL (ref 4.0–10.5)
nRBC: 0 % (ref 0.0–0.2)

## 2020-08-26 LAB — CMP (CANCER CENTER ONLY)
ALT: 7 U/L (ref 0–44)
AST: 12 U/L — ABNORMAL LOW (ref 15–41)
Albumin: 3.5 g/dL (ref 3.5–5.0)
Alkaline Phosphatase: 65 U/L (ref 38–126)
Anion gap: 4 — ABNORMAL LOW (ref 5–15)
BUN: 19 mg/dL (ref 8–23)
CO2: 28 mmol/L (ref 22–32)
Calcium: 8.7 mg/dL — ABNORMAL LOW (ref 8.9–10.3)
Chloride: 108 mmol/L (ref 98–111)
Creatinine: 0.73 mg/dL (ref 0.44–1.00)
GFR, Estimated: >60 mL/min (ref >60)
Glucose, Bld: 95 mg/dL (ref 70–99)
Potassium: 4 mmol/L (ref 3.5–5.1)
Sodium: 140 mmol/L (ref 135–145)
Total Bilirubin: 0.4 mg/dL (ref 0.3–1.2)
Total Protein: 6.5 g/dL (ref 6.5–8.1)

## 2020-08-26 MED ORDER — DIPHENHYDRAMINE HCL 25 MG PO CAPS
ORAL_CAPSULE | ORAL | Status: AC
  Filled 2020-08-26: qty 1

## 2020-08-26 MED ORDER — SODIUM CHLORIDE 0.9 % IV SOLN
Freq: Once | INTRAVENOUS | Status: AC
Start: 2020-08-26 — End: 2020-08-26
  Filled 2020-08-26: qty 250

## 2020-08-26 MED ORDER — SODIUM CHLORIDE 0.9 % IV SOLN
420.0000 mg | Freq: Once | INTRAVENOUS | Status: AC
  Administered 2020-08-26: 420 mg via INTRAVENOUS
  Filled 2020-08-26: qty 14

## 2020-08-26 MED ORDER — SODIUM CHLORIDE 0.9 % IV SOLN
10.0000 mg | Freq: Once | INTRAVENOUS | Status: AC
  Administered 2020-08-26: 10 mg via INTRAVENOUS
  Filled 2020-08-26: qty 10

## 2020-08-26 MED ORDER — ACETAMINOPHEN 325 MG PO TABS
ORAL_TABLET | ORAL | Status: AC
  Filled 2020-08-26: qty 2

## 2020-08-26 MED ORDER — ACETAMINOPHEN 325 MG PO TABS
650.0000 mg | ORAL_TABLET | Freq: Once | ORAL | Status: AC
  Administered 2020-08-26: 650 mg via ORAL

## 2020-08-26 MED ORDER — TRASTUZUMAB-ANNS CHEMO 150 MG IV SOLR
6.0000 mg/kg | Freq: Once | INTRAVENOUS | Status: AC
  Administered 2020-08-26: 336 mg via INTRAVENOUS
  Filled 2020-08-26: qty 16

## 2020-08-26 MED ORDER — SODIUM CHLORIDE 0.9 % IV SOLN
50.0000 mg/m2 | Freq: Once | INTRAVENOUS | Status: AC
  Administered 2020-08-26: 80 mg via INTRAVENOUS
  Filled 2020-08-26: qty 8

## 2020-08-26 MED ORDER — DIPHENHYDRAMINE HCL 25 MG PO CAPS
25.0000 mg | ORAL_CAPSULE | Freq: Once | ORAL | Status: AC
  Administered 2020-08-26: 25 mg via ORAL

## 2020-08-26 MED ORDER — SODIUM CHLORIDE 0.9% FLUSH
10.0000 mL | Freq: Once | INTRAVENOUS | Status: AC
Start: 2020-08-26 — End: 2020-08-26
  Administered 2020-08-26: 10 mL
  Filled 2020-08-26: qty 10

## 2020-08-26 NOTE — Progress Notes (Signed)
Nutrition Follow-up:  Patient with right breast cancer. She is receiving neoadjuvant chemotherapy.   Met with patient in infusion. She reports doing well, glad to be finishing with treatment today. Patient reports she will meet with surgeon on Monday. She denies nutrition impact symptoms, says her appetite is good and eats well. Patient continues to drink Ensure supplements. Patient mixes them with milk to make them last longer, she is asking for another complimentary case today. Yesterday she had 1/2 milk 1/2 Ensure with eggs, tomato gravy, and biscuit for breakfast, bologna/cheese sandwich for lunch, bowl of vegetable soup for dinner and other half of Ensure mixed with milk for bedtime snack.   Medications: Zofran, Compazine  Labs: reviewed  Anthropometrics: Weight 120 lbs today stable  6/24 - 120 lb 3.2 oz 5/13 - 123 lb 3.2 oz 4/22 - 122 lb 9.6 oz   NUTRITION DIAGNOSIS: Unintended weight loss stable   INTERVENTION:  Congratulated patient on final chemotherapy treatment Discussed increased calorie and protein needs to support post operative healing Continue eating high calorie, high protein foods for weight maintenance Continue drinking 1-2 Ensure Plus/day for added calories and protein Complimentary case of Ensure Plus provided today Patient has contact information   MONITORING, EVALUATION, GOAL: weight trends, intake   NEXT VISIT: To be scheduled as needed with treatment plan, pt encouraged to contact as needed

## 2020-08-26 NOTE — Patient Instructions (Signed)
[facility]  Discharge Instructions: Thank you for choosing [facility] to provide your oncology and hematology care.   If you have a lab appointment with the [facility], please go directly to the [facility] and check in at the registration area.   Wear comfortable clothing and clothing appropriate for easy access to any Portacath or PICC line.   We strive to give you quality time with your provider. You may need to reschedule your appointment if you arrive late (15 or more minutes).  Arriving late affects you and other patients whose appointments are after yours.  Also, if you miss three or more appointments without notifying the office, you may be dismissed from the clinic at the provider's discretion.      For prescription refill requests, have your pharmacy contact our office and allow 72 hours for refills to be completed.    Today you received the following chemotherapy and/or immunotherapy agents: Herceptin, Taxotere and Perjeta.    To help prevent nausea and vomiting after your treatment, we encourage you to take your nausea medication as directed.  BELOW ARE SYMPTOMS THAT SHOULD BE REPORTED IMMEDIATELY: *FEVER GREATER THAN 100.4 F (38 C) OR HIGHER *CHILLS OR SWEATING *NAUSEA AND VOMITING THAT IS NOT CONTROLLED WITH YOUR NAUSEA MEDICATION *UNUSUAL SHORTNESS OF BREATH *UNUSUAL BRUISING OR BLEEDING *URINARY PROBLEMS (pain or burning when urinating, or frequent urination) *BOWEL PROBLEMS (unusual diarrhea, constipation, pain near the anus) TENDERNESS IN MOUTH AND THROAT WITH OR WITHOUT PRESENCE OF ULCERS (sore throat, sores in mouth, or a toothache) UNUSUAL RASH, SWELLING OR PAIN  UNUSUAL VAGINAL DISCHARGE OR ITCHING   Items with * indicate a potential emergency and should be followed up as soon as possible or go to the Emergency Department if any problems should occur.  Please show the CHEMOTHERAPY ALERT CARD or IMMUNOTHERAPY ALERT  CARD at check-in to the Emergency Department and triage nurse.  Should you have questions after your visit or need to cancel or reschedule your appointment, please contact [facility]  Dept: [phone]  and follow the prompts.  Office hours are 8:00 a.m. to 4:30 p.m. Monday - Friday. Please note that voicemails left after 4:00 p.m. may not be returned until the following business day.  We are closed weekends and major holidays. You have access to a nurse at all times for urgent questions. Please call the main number to the clinic Dept: [phone] and follow the prompts.   For any non-urgent questions, you may also contact your provider using MyChart. We now offer e-Visits for anyone 31 and older to request care online for non-urgent symptoms. For details visit [email or web address].   Also download the MyChart app! Go to the app store, search "MyChart", open the app, select [facility], and log in with your MyChart username and password.  Due to Covid, a mask is required upon entering the hospital/clinic. If you do not have a mask, one will be given to you upon arrival. For doctor visits, patients may have 1 support person aged 88 or older with them. For treatment visits, patients cannot have anyone with them due to current Covid guidelines and our immunocompromised population.  Trastuzumab injection for infusion What is this medicine? TRASTUZUMAB (tras TOO zoo mab) is a monoclonal antibody. It is used to treat breast cancer and stomach cancer. This medicine may be used for other purposes; ask your health care provider or pharmacist if you have questions. COMMON BRAND NAME(S):  Herceptin, Donnald Garre What should I tell my health care provider before I take this medicine? They need to know if you have any of these conditions: heart disease heart failure lung or breathing disease, like asthma an unusual or allergic reaction to  trastuzumab, benzyl alcohol, or other medications, foods, dyes, or preservatives pregnant or trying to get pregnant breast-feeding How should I use this medicine? This drug is given as an infusion into a vein. It is administered in a hospital or clinic by a specially trained health care professional. Talk to your pediatrician regarding the use of this medicine in children. This medicine is not approved for use in children. Overdosage: If you think you have taken too much of this medicine contact a poison control center or emergency room at once. NOTE: This medicine is only for you. Do not share this medicine with others. What if I miss a dose? It is important not to miss a dose. Call your doctor or health care professional if you are unable to keep an appointment. What may interact with this medicine? This medicine may interact with the following medications: certain types of chemotherapy, such as daunorubicin, doxorubicin, epirubicin, and idarubicin This list may not describe all possible interactions. Give your health care provider a list of all the medicines, herbs, non-prescription drugs, or dietary supplements you use. Also tell them if you smoke, drink alcohol, or use illegal drugs. Some items may interact with your medicine. What should I watch for while using this medicine? Visit your doctor for checks on your progress. Report any side effects. Continue your course of treatment even though you feel ill unless your doctor tells you to stop. Call your doctor or health care professional for advice if you get a fever, chills or sore throat, or other symptoms of a cold or flu. Do not treat yourself. Try to avoid being around people who are sick. You may experience fever, chills and shaking during your first infusion. These effects are usually mild and can be treated with other medicines. Report any side effects during the infusion to your health care professional. Fever and chills usually do not  happen with later infusions. Do not become pregnant while taking this medicine or for 7 months after stopping it. Women should inform their doctor if they wish to become pregnant or think they might be pregnant. Women of child-bearing potential will need to have a negative pregnancy test before starting this medicine. There is a potential for serious side effects to an unborn child. Talk to your health care professional or pharmacist for more information. Do not breast-feed an infant while taking this medicine or for 7 months after stopping it. Women must use effective birth control with this medicine. What side effects may I notice from receiving this medicine? Side effects that you should report to your doctor or health care professional as soon as possible: allergic reactions like skin rash, itching or hives, swelling of the face, lips, or tongue chest pain or palpitations cough dizziness feeling faint or lightheaded, falls fever general ill feeling or flu-like symptoms signs of worsening heart failure like breathing problems; swelling in your legs and feet unusually weak or tired Side effects that usually do not require medical attention (report to your doctor or health care professional if they continue or are bothersome): bone pain changes in taste diarrhea joint pain nausea/vomiting weight loss This list may not describe all possible side effects. Call your doctor for medical advice about side  effects. You may report side effects to FDA at 1-800-FDA-1088. Where should I keep my medicine? This drug is given in a hospital or clinic and will not be stored at home. NOTE: This sheet is a summary. It may not cover all possible information. If you have questions about this medicine, talk to your doctor, pharmacist, or health care provider.  2021 Elsevier/Gold Standard (2016-01-24 14:37:52) Pertuzumab injection What is this medicine? PERTUZUMAB (per TOOZ ue mab) is a monoclonal antibody.  It is used to treat breast cancer. This medicine may be used for other purposes; ask your health care provider or pharmacist if you have questions. COMMON BRAND NAME(S): PERJETA What should I tell my health care provider before I take this medicine? They need to know if you have any of these conditions: heart disease heart failure high blood pressure history of irregular heart beat recent or ongoing radiation therapy an unusual or allergic reaction to pertuzumab, other medicines, foods, dyes, or preservatives pregnant or trying to get pregnant breast-feeding How should I use this medicine? This medicine is for infusion into a vein. It is given by a health care professional in a hospital or clinic setting. Talk to your pediatrician regarding the use of this medicine in children. Special care may be needed. Overdosage: If you think you have taken too much of this medicine contact a poison control center or emergency room at once. NOTE: This medicine is only for you. Do not share this medicine with others. What if I miss a dose? It is important not to miss your dose. Call your doctor or health care professional if you are unable to keep an appointment. What may interact with this medicine? Interactions are not expected. Give your health care provider a list of all the medicines, herbs, non-prescription drugs, or dietary supplements you use. Also tell them if you smoke, drink alcohol, or use illegal drugs. Some items may interact with your medicine. This list may not describe all possible interactions. Give your health care provider a list of all the medicines, herbs, non-prescription drugs, or dietary supplements you use. Also tell them if you smoke, drink alcohol, or use illegal drugs. Some items may interact with your medicine. What should I watch for while using this medicine? Your condition will be monitored carefully while you are receiving this medicine. Report any side effects. Continue  your course of treatment even though you feel ill unless your doctor tells you to stop. Do not become pregnant while taking this medicine or for 7 months after stopping it. Women should inform their doctor if they wish to become pregnant or think they might be pregnant. Women of child-bearing potential will need to have a negative pregnancy test before starting this medicine. There is a potential for serious side effects to an unborn child. Talk to your health care professional or pharmacist for more information. Do not breast-feed an infant while taking this medicine or for 7 months after stopping it. Women must use effective birth control with this medicine. Call your doctor or health care professional for advice if you get a fever, chills or sore throat, or other symptoms of a cold or flu. Do not treat yourself. Try to avoid being around people who are sick. You may experience fever, chills, and headache during the infusion. Report any side effects during the infusion to your health care professional. What side effects may I notice from receiving this medicine? Side effects that you should report to your doctor or health care  professional as soon as possible: breathing problems chest pain or palpitations dizziness feeling faint or lightheaded fever or chills skin rash, itching or hives sore throat swelling of the face, lips, or tongue swelling of the legs or ankles unusually weak or tired Side effects that usually do not require medical attention (report to your doctor or health care professional if they continue or are bothersome): diarrhea hair loss nausea, vomiting tiredness This list may not describe all possible side effects. Call your doctor for medical advice about side effects. You may report side effects to FDA at 1-800-FDA-1088. Where should I keep my medicine? This drug is given in a hospital or clinic and will not be stored at home. NOTE: This sheet is a summary. It may not  cover all possible information. If you have questions about this medicine, talk to your doctor, pharmacist, or health care provider.  2021 Elsevier/Gold Standard (2015-03-03 12:08:50) Docetaxel injection What is this medicine? DOCETAXEL (doe se TAX el) is a chemotherapy drug. It targets fast dividing cells, like cancer cells, and causes these cells to die. This medicine is used to treat many types of cancers like breast cancer, certain stomach cancers, head and neck cancer, lung cancer, and prostate cancer. This medicine may be used for other purposes; ask your health care provider or pharmacist if you have questions. COMMON BRAND NAME(S): Docefrez, Taxotere What should I tell my health care provider before I take this medicine? They need to know if you have any of these conditions: infection (especially a virus infection such as chickenpox, cold sores, or herpes) liver disease low blood counts, like low white cell, platelet, or red cell counts an unusual or allergic reaction to docetaxel, polysorbate 80, other chemotherapy agents, other medicines, foods, dyes, or preservatives pregnant or trying to get pregnant breast-feeding How should I use this medicine? This drug is given as an infusion into a vein. It is administered in a hospital or clinic by a specially trained health care professional. Talk to your pediatrician regarding the use of this medicine in children. Special care may be needed. Overdosage: If you think you have taken too much of this medicine contact a poison control center or emergency room at once. NOTE: This medicine is only for you. Do not share this medicine with others. What if I miss a dose? It is important not to miss your dose. Call your doctor or health care professional if you are unable to keep an appointment. What may interact with this medicine? Do not take this medicine with any of the following medications: live virus vaccines This medicine may also interact  with the following medications: aprepitant certain antibiotics like erythromycin or clarithromycin certain antivirals for HIV or hepatitis certain medicines for fungal infections like fluconazole, itraconazole, ketoconazole, posaconazole, or voriconazole cimetidine ciprofloxacin conivaptan cyclosporine dronedarone fluvoxamine grapefruit juice imatinib verapamil This list may not describe all possible interactions. Give your health care provider a list of all the medicines, herbs, non-prescription drugs, or dietary supplements you use. Also tell them if you smoke, drink alcohol, or use illegal drugs. Some items may interact with your medicine. What should I watch for while using this medicine? Your condition will be monitored carefully while you are receiving this medicine. You will need important blood work done while you are taking this medicine. Call your doctor or health care professional for advice if you get a fever, chills or sore throat, or other symptoms of a cold or flu. Do not treat yourself. This drug  decreases your body's ability to fight infections. Try to avoid being around people who are sick. Some products may contain alcohol. Ask your health care professional if this medicine contains alcohol. Be sure to tell all health care professionals you are taking this medicine. Certain medicines, like metronidazole and disulfiram, can cause an unpleasant reaction when taken with alcohol. The reaction includes flushing, headache, nausea, vomiting, sweating, and increased thirst. The reaction can last from 30 minutes to several hours. You may get drowsy or dizzy. Do not drive, use machinery, or do anything that needs mental alertness until you know how this medicine affects you. Do not stand or sit up quickly, especially if you are an older patient. This reduces the risk of dizzy or fainting spells. Alcohol may interfere with the effect of this medicine. Talk to your health care professional  about your risk of cancer. You may be more at risk for certain types of cancer if you take this medicine. Do not become pregnant while taking this medicine or for 6 months after stopping it. Women should inform their doctor if they wish to become pregnant or think they might be pregnant. There is a potential for serious side effects to an unborn child. Talk to your health care professional or pharmacist for more information. Do not breast-feed an infant while taking this medicine or for 1 week after stopping it. Males who get this medicine must use a condom during sex with females who can get pregnant. If you get a woman pregnant, the baby could have birth defects. The baby could die before they are born. You will need to continue wearing a condom for 3 months after stopping the medicine. Tell your health care provider right away if your partner becomes pregnant while you are taking this medicine. This may interfere with the ability to father a child. You should talk to your doctor or health care professional if you are concerned about your fertility. What side effects may I notice from receiving this medicine? Side effects that you should report to your doctor or health care professional as soon as possible: allergic reactions like skin rash, itching or hives, swelling of the face, lips, or tongue blurred vision breathing problems changes in vision low blood counts - This drug may decrease the number of white blood cells, red blood cells and platelets. You may be at increased risk for infections and bleeding. nausea and vomiting pain, redness or irritation at site where injected pain, tingling, numbness in the hands or feet redness, blistering, peeling, or loosening of the skin, including inside the mouth signs of decreased platelets or bleeding - bruising, pinpoint red spots on the skin, black, tarry stools, nosebleeds signs of decreased red blood cells - unusually weak or tired, fainting spells,  lightheadedness signs of infection - fever or chills, cough, sore throat, pain or difficulty passing urine swelling of the ankle, feet, hands Side effects that usually do not require medical attention (report to your doctor or health care professional if they continue or are bothersome): constipation diarrhea fingernail or toenail changes hair loss loss of appetite mouth sores muscle pain This list may not describe all possible side effects. Call your doctor for medical advice about side effects. You may report side effects to FDA at 1-800-FDA-1088. Where should I keep my medicine? This drug is given in a hospital or clinic and will not be stored at home. NOTE: This sheet is a summary. It may not cover all possible information. If you have  questions about this medicine, talk to your doctor, pharmacist, or health care provider.  2021 Elsevier/Gold Standard (2018-12-29 19:50:31)

## 2020-08-26 NOTE — Assessment & Plan Note (Signed)
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 Neoadj chemo withTaxotere Herceptin Perjeta 2. Mastectomywith sentinel node biopsy 3. Adj XRT 4. Adj Anti estrogen therapy -------------------------------------------------------------------------------------------------- Current treatment: Cycle6Taxotere Herceptin Perjeta to be done tomorrow  Echocardiogram 04/23/2020: EF 55% Chemo toxicities: 1.Mild nausea 2.Fatigue 3.Anemia: Probably due to bleeding from the tumor, stable 4.Thrush:resolved  Patient will see surgery for discussion regarding mastectomy Poornutrition: I sent for boost to supplement prescription. Return to clinic in3weeks for Herceptin and Perjeta

## 2020-08-26 NOTE — Patient Instructions (Signed)

## 2020-08-29 ENCOUNTER — Ambulatory Visit: Payer: Self-pay | Admitting: Surgery

## 2020-08-29 ENCOUNTER — Other Ambulatory Visit: Payer: Self-pay

## 2020-08-29 ENCOUNTER — Inpatient Hospital Stay: Payer: Medicare HMO

## 2020-08-29 VITALS — BP 144/66 | HR 98 | Temp 98.4°F | Resp 18 | Ht 65.5 in | Wt 118.2 lb

## 2020-08-29 DIAGNOSIS — C50911 Malignant neoplasm of unspecified site of right female breast: Secondary | ICD-10-CM

## 2020-08-29 DIAGNOSIS — C50111 Malignant neoplasm of central portion of right female breast: Secondary | ICD-10-CM | POA: Diagnosis not present

## 2020-08-29 DIAGNOSIS — Z5112 Encounter for antineoplastic immunotherapy: Secondary | ICD-10-CM | POA: Diagnosis not present

## 2020-08-29 DIAGNOSIS — Z5189 Encounter for other specified aftercare: Secondary | ICD-10-CM | POA: Diagnosis not present

## 2020-08-29 DIAGNOSIS — Z170 Estrogen receptor positive status [ER+]: Secondary | ICD-10-CM | POA: Diagnosis not present

## 2020-08-29 DIAGNOSIS — Z5111 Encounter for antineoplastic chemotherapy: Secondary | ICD-10-CM | POA: Diagnosis not present

## 2020-08-29 MED ORDER — PEGFILGRASTIM-CBQV 6 MG/0.6ML ~~LOC~~ SOSY
6.0000 mg | PREFILLED_SYRINGE | Freq: Once | SUBCUTANEOUS | Status: AC
  Administered 2020-08-29: 6 mg via SUBCUTANEOUS

## 2020-08-29 MED ORDER — PEGFILGRASTIM-CBQV 6 MG/0.6ML ~~LOC~~ SOSY
PREFILLED_SYRINGE | SUBCUTANEOUS | Status: AC
  Filled 2020-08-29: qty 0.6

## 2020-08-29 NOTE — Patient Instructions (Signed)

## 2020-09-08 ENCOUNTER — Encounter: Payer: Self-pay | Admitting: *Deleted

## 2020-09-08 ENCOUNTER — Encounter: Payer: Self-pay | Admitting: Hematology and Oncology

## 2020-09-09 ENCOUNTER — Telehealth: Payer: Self-pay | Admitting: Hematology and Oncology

## 2020-09-09 NOTE — Telephone Encounter (Signed)
Scheduled appt per 7/28 sch msg. Called pt, no answer and mailbox was full, unable to leave a msg. Will try to call pt again on Monday.

## 2020-09-12 ENCOUNTER — Telehealth: Payer: Self-pay | Admitting: Hematology and Oncology

## 2020-09-12 ENCOUNTER — Telehealth: Payer: Self-pay | Admitting: *Deleted

## 2020-09-12 ENCOUNTER — Other Ambulatory Visit: Payer: Self-pay | Admitting: Hematology and Oncology

## 2020-09-12 MED ORDER — ATORVASTATIN CALCIUM 20 MG PO TABS: 20.0000 mg | ORAL_TABLET | Freq: Every day | ORAL | 2 refills | Status: DC

## 2020-09-12 MED ORDER — TRAZODONE HCL 300 MG PO TABS: 300.0000 mg | ORAL_TABLET | Freq: Every day | ORAL | 2 refills | Status: DC

## 2020-09-12 NOTE — Telephone Encounter (Signed)
Called pt again today about appts scheduled on 7/28, no answer and VM was full, unable to leave a msg. Mailed updated calendar to pt.

## 2020-09-12 NOTE — Telephone Encounter (Signed)
Daughter called to see if Dr Lindi Adie can fill Lipitor and Trazadone for 1 month. Has changed insurance and cannot get in to see new PCP until September. To see Dr Lindi Adie on 09/29/20

## 2020-09-19 ENCOUNTER — Other Ambulatory Visit: Payer: Self-pay

## 2020-09-19 ENCOUNTER — Encounter (HOSPITAL_COMMUNITY): Payer: Self-pay | Admitting: Surgery

## 2020-09-19 NOTE — Progress Notes (Signed)
PCP - Pt daughter [name]) - pt is to see new provider in septemer Cardiologist - Dr. Irish Lack  EKG - 07/05/20 Chest x-ray -  ECHO - 08/04/20 Cardiac Cath -  CPAP -   ERAS Protcol - yes clears 1130  COVID TEST- DOS  Anesthesia review: yes  -------------  SDW INSTRUCTIONS:  Your procedure is scheduled on Tuesday 8/9. Please report to [facility] Entrance "A" at 1200 PM., and check in at the Admitting office. Call this number if you have problems the morning of surgery: [phone]   Remember: Do not eat after midnight the night before your surgery  You may drink clear liquids until 1130 the morning of your surgery.   Clear liquids allowed are: Water, Non-Citrus Juices (without pulp), Carbonated Beverages, Clear Tea, Black Coffee Only, and Gatorade   Medications to take morning of surgery with a sip of water include: Inhaler --- Please bring all inhalers with you the day of surgery.  atorvastatin (LIPITOR)  Claritin  Zofran   As of today, STOP taking any Aspirin (unless otherwise instructed by your surgeon), Aleve, Naproxen, Ibuprofen, Motrin, Advil, Goody's, BC's, all herbal medications, fish oil, and all vitamins.    The Morning of Surgery Do not wear jewelry, make-up or nail polish. Do not wear lotions, powders, or perfumes, or deodorant Do not shave 48 hours prior to surgery.   Do not bring valuables to the hospital. [facility] is not responsible for any belongings or valuables.  If you are a smoker, DO NOT Smoke 24 hours prior to surgery  If you wear a CPAP at night please bring your mask the morning of surgery   Remember that you must have someone to transport you home after your surgery, and remain with you for 24 hours if you are discharged the same day.  Please bring cases for contacts, glasses, hearing aids, dentures or bridgework because it cannot be worn into surgery.   Patients discharged the day of surgery will not be allowed to drive home.   Please  shower the NIGHT BEFORE/MORNING OF SURGERY (use antibacterial soap like DIAL soap if possible). Wear comfortable clothes the morning of surgery. Oral Hygiene is also important to reduce your risk of infection.  Remember - BRUSH YOUR TEETH THE MORNING OF SURGERY WITH YOUR REGULAR TOOTHPASTE  Patient denies shortness of breath, fever, cough and chest pain.

## 2020-09-19 NOTE — Progress Notes (Signed)
Anesthesia Chart Review: Same day workup  Recent admission March 2022 for syncopal event.  COVID-19 screening positive on admission.  Syncope ultimately felt due to dehydration secondary to mild colitis.  Rapidly improved with IV hydration.  Colitis treated with IV Flagyl and cephalosporin.  Chest x-ray incidentally showed right lung base density concerning for underlying mass.  CT scan of the chest, abdomen, and pelvis were significant for right breast mass measuring 8.1 cm with associated right axillary adenopathy, right breast nodule 14 x 7 mm versus intramammary node. Oncology was consulted during admission, neoadjuvant chemotherapy followed by mastectomy was recommended.  Port catheter was placed 04/26/2020 by IR.  CMP and CBC from 08/26/2020 notable for anemia with hemoglobin 9.6 (appears stable compared with recent labs): Otherwise unremarkable.  Patient will need day of surgery evaluation.  EKG 07/05/2020: NSR.  Rate 83.  TTE 08/04/2020: 1. Left ventricular ejection fraction, by estimation, is 55 to 60%. Left  ventricular ejection fraction by 3D volume is 56 %. The left ventricle has  normal function. The left ventricle has no regional wall motion  abnormalities. There is mild left  ventricular hypertrophy. Left ventricular diastolic parameters are  consistent with Grade I diastolic dysfunction (impaired relaxation).   2. Right ventricular systolic function is normal. The right ventricular  size is normal. There is normal pulmonary artery systolic pressure. The  estimated right ventricular systolic pressure is 0000000 mmHg.   3. Left atrial size was mildly dilated.   4. The mitral valve is grossly normal. Trivial mitral valve  regurgitation. No evidence of mitral stenosis.   5. The aortic valve was not well visualized. There is mild calcification  of the aortic valve. Aortic valve regurgitation is not visualized. Mild to  moderate aortic valve sclerosis/calcification is present, without  any  evidence of aortic stenosis.   6. The inferior vena cava is normal in size with greater than 50%  respiratory variability, suggesting right atrial pressure of 3 mmHg.    Wynonia Musty [facility] Short Stay Center/Anesthesiology Phone [phone] 09/19/2020 1:40 PM

## 2020-09-19 NOTE — Anesthesia Preprocedure Evaluation (Addendum)
Anesthesia Evaluation  Patient identified by MRN, date of birth, ID band Patient awake    Reviewed: Allergy & Precautions, NPO status , Patient's Chart, lab work & pertinent test results  Airway Mallampati: II  TM Distance: >3 FB Neck ROM: Full    Dental  (+) Edentulous Upper, Edentulous Lower, Dental Advisory Given   Pulmonary COPD, former smoker,    Pulmonary exam normal breath sounds clear to auscultation       Cardiovascular hypertension, Pt. on medications Normal cardiovascular exam+ Valvular Problems/Murmurs  Rhythm:Regular Rate:Normal  Echo 07/2020 1. Left ventricular ejection fraction, by estimation, is 55 to 60%. Left ventricular ejection fraction by 3D volume is 56 %. The left ventricle has normal function. The left ventricle has no regional wall motion abnormalities. There is mild left ventricular hypertrophy. Left ventricular diastolic parameters are consistent with Grade I diastolic dysfunction (impaired relaxation).  2. Right ventricular systolic function is normal. The right ventricular size is normal. There is normal pulmonary artery systolic pressure. The estimated right ventricular systolic pressure is 0000000 mmHg.  3. Left atrial size was mildly dilated.  4. The mitral valve is grossly normal. Trivial mitral valve  regurgitation. No evidence of mitral stenosis.  5. The aortic valve was not well visualized. There is mild calcification of the aortic valve. Aortic valve regurgitation is not visualized. Mild to moderate aortic valve sclerosis/calcification is present, without any evidence of aortic stenosis.  6. The inferior vena cava is normal in size with greater than 50% respiratory variability, suggesting right atrial pressure of 3 mmHg.    Neuro/Psych CVA    GI/Hepatic negative GI ROS, Neg liver ROS,   Endo/Other  negative endocrine ROS  Renal/GU Renal disease     Musculoskeletal negative musculoskeletal  ROS (+)   Abdominal   Peds  Hematology negative hematology ROS (+) anemia ,   Anesthesia Other Findings   Reproductive/Obstetrics                           Anesthesia Physical Anesthesia Plan  ASA: 3  Anesthesia Plan: General   Post-op Pain Management: GA combined w/ Regional for post-op pain   Induction: Intravenous  PONV Risk Score and Plan: 4 or greater and Ondansetron, Dexamethasone, Treatment may vary due to age or medical condition and Midazolam  Airway Management Planned: LMA  Additional Equipment: None  Intra-op Plan:   Post-operative Plan: Extubation in OR  Informed Consent: I have reviewed the patients History and Physical, chart, labs and discussed the procedure including the risks, benefits and alternatives for the proposed anesthesia with the patient or authorized representative who has indicated his/her understanding and acceptance.     Dental advisory given  Plan Discussed with: CRNA  Anesthesia Plan Comments: (PAT note by Karoline Caldwell, PA-C: Recent admission March 2022 for syncopal event.  COVID-19 screening positive on admission.  Syncope ultimately felt due to dehydration secondary to mild colitis.  Rapidly improved with IV hydration.  Colitis treated with IV Flagyl and cephalosporin.  Chest x-ray incidentally showed right lung base density concerning for underlying mass.  CT scan of the chest, abdomen, and pelvis were significant for right breast mass measuring 8.1 cm with associated right axillary adenopathy, right breast nodule 14 x 7 mm versus intramammary node. Oncology was consulted during admission, neoadjuvant chemotherapy followed by mastectomy was recommended.  Port catheter was placed 04/26/2020 by IR.  CMP and CBC from 08/26/2020 notable for anemia with hemoglobin 9.6 (appears stable  compared with recent labs): Otherwise unremarkable.  Patient will need day of surgery evaluation.  EKG 07/05/2020: NSR.  Rate 83.  TTE  08/04/2020: 1. Left ventricular ejection fraction, by estimation, is 55 to 60%. Left  ventricular ejection fraction by 3D volume is 56 %. The left ventricle has  normal function. The left ventricle has no regional wall motion  abnormalities. There is mild left  ventricular hypertrophy. Left ventricular diastolic parameters are  consistent with Grade I diastolic dysfunction (impaired relaxation).  2. Right ventricular systolic function is normal. The right ventricular  size is normal. There is normal pulmonary artery systolic pressure. The  estimated right ventricular systolic pressure is 0000000 mmHg.  3. Left atrial size was mildly dilated.  4. The mitral valve is grossly normal. Trivial mitral valve  regurgitation. No evidence of mitral stenosis.  5. The aortic valve was not well visualized. There is mild calcification  of the aortic valve. Aortic valve regurgitation is not visualized. Mild to  moderate aortic valve sclerosis/calcification is present, without any  evidence of aortic stenosis.  6. The inferior vena cava is normal in size with greater than 50%  respiratory variability, suggesting right atrial pressure of 3 mmHg.   )     Anesthesia Quick Evaluation

## 2020-09-20 ENCOUNTER — Ambulatory Visit (HOSPITAL_COMMUNITY): Payer: Medicare HMO | Admitting: Physician Assistant

## 2020-09-20 ENCOUNTER — Encounter (HOSPITAL_COMMUNITY)
Admission: RE | Disposition: A | Discharge status: Home / Self Care | Payer: Self-pay | Source: Home / Self Care | Attending: Surgery

## 2020-09-20 ENCOUNTER — Observation Stay (HOSPITAL_COMMUNITY)
Admission: RE | Admit: 2020-09-20 | Discharge: 2020-09-21 | Disposition: A | Discharge status: Home / Self Care | Payer: Medicare HMO | Attending: Surgery | Admitting: Surgery

## 2020-09-20 DIAGNOSIS — C50111 Malignant neoplasm of central portion of right female breast: Secondary | ICD-10-CM | POA: Diagnosis not present

## 2020-09-20 DIAGNOSIS — Z79899 Other long term (current) drug therapy: Secondary | ICD-10-CM | POA: Insufficient documentation

## 2020-09-20 DIAGNOSIS — Z170 Estrogen receptor positive status [ER+]: Secondary | ICD-10-CM | POA: Diagnosis not present

## 2020-09-20 DIAGNOSIS — C50911 Malignant neoplasm of unspecified site of right female breast: Principal | ICD-10-CM | POA: Insufficient documentation

## 2020-09-20 DIAGNOSIS — C773 Secondary and unspecified malignant neoplasm of axilla and upper limb lymph nodes: Secondary | ICD-10-CM | POA: Diagnosis not present

## 2020-09-20 DIAGNOSIS — C50011 Malignant neoplasm of nipple and areola, right female breast: Secondary | ICD-10-CM | POA: Diagnosis not present

## 2020-09-20 DIAGNOSIS — J449 Chronic obstructive pulmonary disease, unspecified: Secondary | ICD-10-CM | POA: Insufficient documentation

## 2020-09-20 DIAGNOSIS — E785 Hyperlipidemia, unspecified: Secondary | ICD-10-CM | POA: Diagnosis not present

## 2020-09-20 DIAGNOSIS — G8918 Other acute postprocedural pain: Secondary | ICD-10-CM | POA: Diagnosis not present

## 2020-09-20 DIAGNOSIS — C779 Secondary and unspecified malignant neoplasm of lymph node, unspecified: Secondary | ICD-10-CM | POA: Insufficient documentation

## 2020-09-20 DIAGNOSIS — Z87891 Personal history of nicotine dependence: Secondary | ICD-10-CM | POA: Insufficient documentation

## 2020-09-20 DIAGNOSIS — L98499 Non-pressure chronic ulcer of skin of other sites with unspecified severity: Secondary | ICD-10-CM | POA: Diagnosis not present

## 2020-09-20 DIAGNOSIS — Z20822 Contact with and (suspected) exposure to covid-19: Secondary | ICD-10-CM | POA: Diagnosis not present

## 2020-09-20 HISTORY — PX: MODIFIED MASTECTOMY: SHX5268

## 2020-09-20 LAB — CBC WITH DIFFERENTIAL/PLATELET
Abs Immature Granulocytes: 0.02 10*3/uL (ref 0.00–0.07)
Basophils Absolute: 0.1 10*3/uL (ref 0.0–0.1)
Basophils Relative: 1 %
Eosinophils Absolute: 0.1 10*3/uL (ref 0.0–0.5)
Eosinophils Relative: 1 %
HCT: 34.4 % — ABNORMAL LOW (ref 36.0–46.0)
Hemoglobin: 10.2 g/dL — ABNORMAL LOW (ref 12.0–15.0)
Immature Granulocytes: 0 %
Lymphocytes Relative: 14 %
Lymphs Abs: 1 10*3/uL (ref 0.7–4.0)
MCH: 25.2 pg — ABNORMAL LOW (ref 26.0–34.0)
MCHC: 29.7 g/dL — ABNORMAL LOW (ref 30.0–36.0)
MCV: 85.1 fL (ref 80.0–100.0)
Monocytes Absolute: 0.5 10*3/uL (ref 0.1–1.0)
Monocytes Relative: 7 %
Neutro Abs: 5.3 10*3/uL (ref 1.7–7.7)
Neutrophils Relative %: 77 %
Platelets: 265 10*3/uL (ref 150–400)
RBC: 4.04 MIL/uL (ref 3.87–5.11)
RDW: 18.9 % — ABNORMAL HIGH (ref 11.5–15.5)
WBC: 6.9 10*3/uL (ref 4.0–10.5)
nRBC: 0 % (ref 0.0–0.2)

## 2020-09-20 LAB — SARS CORONAVIRUS 2 BY RT PCR (HOSPITAL ORDER, PERFORMED IN ~~LOC~~ HOSPITAL LAB): SARS Coronavirus 2: NEGATIVE

## 2020-09-20 LAB — COMPREHENSIVE METABOLIC PANEL
ALT: 12 U/L (ref 0–44)
AST: 16 U/L (ref 15–41)
Albumin: 3.4 g/dL — ABNORMAL LOW (ref 3.5–5.0)
Alkaline Phosphatase: 58 U/L (ref 38–126)
Anion gap: 8 (ref 5–15)
BUN: 13 mg/dL (ref 8–23)
CO2: 27 mmol/L (ref 22–32)
Calcium: 9.1 mg/dL (ref 8.9–10.3)
Chloride: 104 mmol/L (ref 98–111)
Creatinine, Ser: 0.76 mg/dL (ref 0.44–1.00)
GFR, Estimated: >60 mL/min (ref >60)
Glucose, Bld: 110 mg/dL — ABNORMAL HIGH (ref 70–99)
Potassium: 3.7 mmol/L (ref 3.5–5.1)
Sodium: 139 mmol/L (ref 135–145)
Total Bilirubin: 0.2 mg/dL — ABNORMAL LOW (ref 0.3–1.2)
Total Protein: 6.5 g/dL (ref 6.5–8.1)

## 2020-09-20 SURGERY — MODIFIED MASTECTOMY
Anesthesia: General | Site: Breast | Laterality: Right

## 2020-09-20 MED ORDER — ACETAMINOPHEN 500 MG PO TABS
1000.0000 mg | ORAL_TABLET | Freq: Four times a day (QID) | ORAL | Status: DC
  Administered 2020-09-20 – 2020-09-21 (×2): 1000 mg via ORAL
  Filled 2020-09-20 (×2): qty 2

## 2020-09-20 MED ORDER — LACTATED RINGERS IV SOLN: INTRAVENOUS | Status: DC

## 2020-09-20 MED ORDER — OXYCODONE-ACETAMINOPHEN 5-325 MG PO TABS
1.0000 | ORAL_TABLET | ORAL | Status: DC | PRN
  Administered 2020-09-20 – 2020-09-21 (×2): 1 via ORAL
  Filled 2020-09-20 (×2): qty 1

## 2020-09-20 MED ORDER — ORAL CARE MOUTH RINSE: 15.0000 mL | Freq: Once | OROMUCOSAL | Status: AC

## 2020-09-20 MED ORDER — CEFAZOLIN SODIUM-DEXTROSE 2-4 GM/100ML-% IV SOLN
2.0000 g | INTRAVENOUS | Status: AC
  Administered 2020-09-20: 2 g via INTRAVENOUS
  Filled 2020-09-20: qty 100

## 2020-09-20 MED ORDER — METHOCARBAMOL 500 MG PO TABS
500.0000 mg | ORAL_TABLET | Freq: Four times a day (QID) | ORAL | Status: DC | PRN
  Administered 2020-09-20 – 2020-09-21 (×2): 500 mg via ORAL
  Filled 2020-09-20 (×2): qty 1

## 2020-09-20 MED ORDER — PROPOFOL 10 MG/ML IV BOLUS
INTRAVENOUS | Status: DC | PRN
  Administered 2020-09-20: 100 mg via INTRAVENOUS

## 2020-09-20 MED ORDER — ONDANSETRON HCL 4 MG/2ML IJ SOLN
INTRAMUSCULAR | Status: DC | PRN
  Administered 2020-09-20: 4 mg via INTRAVENOUS

## 2020-09-20 MED ORDER — BUPIVACAINE-EPINEPHRINE (PF) 0.5% -1:200000 IJ SOLN
INTRAMUSCULAR | Status: DC | PRN
  Administered 2020-09-20: 30 mL

## 2020-09-20 MED ORDER — AMISULPRIDE (ANTIEMETIC) 5 MG/2ML IV SOLN: 10.0000 mg | Freq: Once | INTRAVENOUS | Status: DC | PRN

## 2020-09-20 MED ORDER — DEXAMETHASONE SODIUM PHOSPHATE 10 MG/ML IJ SOLN
INTRAMUSCULAR | Status: DC | PRN
  Administered 2020-09-20: 4 mg via INTRAVENOUS

## 2020-09-20 MED ORDER — DEXTROSE-NACL 5-0.9 % IV SOLN: INTRAVENOUS | Status: DC

## 2020-09-20 MED ORDER — DEXAMETHASONE SODIUM PHOSPHATE 4 MG/ML IJ SOLN
INTRAMUSCULAR | Status: DC | PRN
  Administered 2020-09-20: 4 mg

## 2020-09-20 MED ORDER — HYDROMORPHONE HCL 1 MG/ML IJ SOLN: 1.0000 mg | INTRAMUSCULAR | Status: DC | PRN

## 2020-09-20 MED ORDER — MEPERIDINE HCL 25 MG/ML IJ SOLN: 6.2500 mg | INTRAMUSCULAR | Status: DC | PRN

## 2020-09-20 MED ORDER — DIPHENHYDRAMINE HCL 12.5 MG/5ML PO ELIX: 12.5000 mg | ORAL_SOLUTION | Freq: Four times a day (QID) | ORAL | Status: DC | PRN

## 2020-09-20 MED ORDER — METOPROLOL TARTRATE 5 MG/5ML IV SOLN: 5.0000 mg | Freq: Four times a day (QID) | INTRAVENOUS | Status: DC | PRN

## 2020-09-20 MED ORDER — PROCHLORPERAZINE MALEATE 10 MG PO TABS
10.0000 mg | ORAL_TABLET | Freq: Four times a day (QID) | ORAL | Status: DC | PRN
  Filled 2020-09-20: qty 1

## 2020-09-20 MED ORDER — EPHEDRINE SULFATE-NACL 50-0.9 MG/10ML-% IV SOSY
PREFILLED_SYRINGE | INTRAVENOUS | Status: DC | PRN
  Administered 2020-09-20: 5 mg via INTRAVENOUS

## 2020-09-20 MED ORDER — CHLORHEXIDINE GLUCONATE 0.12 % MT SOLN
OROMUCOSAL | Status: AC
  Administered 2020-09-20: 15 mL via OROMUCOSAL
  Filled 2020-09-20: qty 15

## 2020-09-20 MED ORDER — FENTANYL CITRATE (PF) 100 MCG/2ML IJ SOLN: 50.0000 ug | Freq: Once | INTRAMUSCULAR | Status: AC

## 2020-09-20 MED ORDER — FENTANYL CITRATE (PF) 100 MCG/2ML IJ SOLN
INTRAMUSCULAR | Status: DC | PRN
  Administered 2020-09-20 (×2): 25 ug via INTRAVENOUS

## 2020-09-20 MED ORDER — CHLORHEXIDINE GLUCONATE 0.12 % MT SOLN: 15.0000 mL | Freq: Once | OROMUCOSAL | Status: AC

## 2020-09-20 MED ORDER — FENTANYL CITRATE (PF) 100 MCG/2ML IJ SOLN
INTRAMUSCULAR | Status: AC
  Filled 2020-09-20: qty 2

## 2020-09-20 MED ORDER — ALBUTEROL SULFATE (2.5 MG/3ML) 0.083% IN NEBU: 2.5000 mg | INHALATION_SOLUTION | Freq: Four times a day (QID) | RESPIRATORY_TRACT | Status: DC | PRN

## 2020-09-20 MED ORDER — HEMOSTATIC AGENTS (NO CHARGE) OPTIME
TOPICAL | Status: DC | PRN
  Administered 2020-09-20: 4 via TOPICAL

## 2020-09-20 MED ORDER — ENOXAPARIN SODIUM 40 MG/0.4ML IJ SOSY
40.0000 mg | PREFILLED_SYRINGE | INTRAMUSCULAR | Status: DC
  Administered 2020-09-21: 40 mg via SUBCUTANEOUS
  Filled 2020-09-20: qty 0.4

## 2020-09-20 MED ORDER — DIPHENHYDRAMINE HCL 50 MG/ML IJ SOLN: 12.5000 mg | Freq: Four times a day (QID) | INTRAMUSCULAR | Status: DC | PRN

## 2020-09-20 MED ORDER — CLONIDINE HCL (ANALGESIA) 100 MCG/ML EP SOLN
EPIDURAL | Status: DC | PRN
  Administered 2020-09-20: 60 ug

## 2020-09-20 MED ORDER — CHLORHEXIDINE GLUCONATE CLOTH 2 % EX PADS
6.0000 | MEDICATED_PAD | Freq: Once | CUTANEOUS | Status: AC
  Administered 2020-09-20: 6 via TOPICAL

## 2020-09-20 MED ORDER — LIDOCAINE 2% (20 MG/ML) 5 ML SYRINGE
INTRAMUSCULAR | Status: DC | PRN
  Administered 2020-09-20: 80 mg via INTRAVENOUS

## 2020-09-20 MED ORDER — FENTANYL CITRATE (PF) 250 MCG/5ML IJ SOLN
INTRAMUSCULAR | Status: AC
  Filled 2020-09-20: qty 5

## 2020-09-20 MED ORDER — CHLORHEXIDINE GLUCONATE CLOTH 2 % EX PADS: 6.0000 | MEDICATED_PAD | Freq: Once | CUTANEOUS | Status: DC

## 2020-09-20 MED ORDER — PROCHLORPERAZINE EDISYLATE 10 MG/2ML IJ SOLN: 5.0000 mg | Freq: Four times a day (QID) | INTRAMUSCULAR | Status: DC | PRN

## 2020-09-20 MED ORDER — FENTANYL CITRATE (PF) 100 MCG/2ML IJ SOLN
INTRAMUSCULAR | Status: AC
  Administered 2020-09-20: 50 ug via INTRAVENOUS
  Filled 2020-09-20: qty 2

## 2020-09-20 MED ORDER — 0.9 % SODIUM CHLORIDE (POUR BTL) OPTIME
TOPICAL | Status: DC | PRN
  Administered 2020-09-20: 1000 mL

## 2020-09-20 MED ORDER — FENTANYL CITRATE (PF) 100 MCG/2ML IJ SOLN
25.0000 ug | INTRAMUSCULAR | Status: DC | PRN
  Administered 2020-09-20: 25 ug via INTRAVENOUS

## 2020-09-20 MED ORDER — MIDAZOLAM HCL 2 MG/2ML IJ SOLN
INTRAMUSCULAR | Status: AC
  Filled 2020-09-20: qty 2

## 2020-09-20 MED ORDER — TRAZODONE HCL 150 MG PO TABS
300.0000 mg | ORAL_TABLET | Freq: Every day | ORAL | Status: DC
  Administered 2020-09-20: 300 mg via ORAL
  Filled 2020-09-20: qty 2

## 2020-09-20 SURGICAL SUPPLY — 48 items
ADH SKN CLS APL DERMABOND .7 (GAUZE/BANDAGES/DRESSINGS) ×1
APL PRP STRL LF DISP 70% ISPRP (MISCELLANEOUS) ×1
APPLIER CLIP 9.375 MED OPEN (MISCELLANEOUS) ×6
APR CLP MED 9.3 20 MLT OPN (MISCELLANEOUS) ×3
BAG COUNTER SPONGE SURGICOUNT (BAG) ×2 IMPLANT
BAG SPNG CNTER NS LX DISP (BAG) ×1
BINDER BREAST LRG (GAUZE/BANDAGES/DRESSINGS) ×1 IMPLANT
BINDER BREAST XLRG (GAUZE/BANDAGES/DRESSINGS) IMPLANT
CANISTER SUCT 3000ML PPV (MISCELLANEOUS) ×2 IMPLANT
CHLORAPREP W/TINT 26 (MISCELLANEOUS) ×2 IMPLANT
CLIP APPLIE 9.375 MED OPEN (MISCELLANEOUS) ×1 IMPLANT
COVER SURGICAL LIGHT HANDLE (MISCELLANEOUS) ×2 IMPLANT
DERMABOND ADVANCED (GAUZE/BANDAGES/DRESSINGS) ×1
DERMABOND ADVANCED .7 DNX12 (GAUZE/BANDAGES/DRESSINGS) ×1 IMPLANT
DEVICE DISSECT PLASMABLAD 3.0S (MISCELLANEOUS) ×1 IMPLANT
DRAIN CHANNEL 19F RND (DRAIN) ×2 IMPLANT
DRAPE LAPAROSCOPIC ABDOMINAL (DRAPES) ×2 IMPLANT
ELECT CAUTERY BLADE 6.4 (BLADE) ×2 IMPLANT
ELECT REM PT RETURN 9FT ADLT (ELECTROSURGICAL) ×2
ELECTRODE REM PT RTRN 9FT ADLT (ELECTROSURGICAL) ×1 IMPLANT
EVACUATOR SILICONE 100CC (DRAIN) ×2 IMPLANT
GAUZE SPONGE 4X4 12PLY STRL (GAUZE/BANDAGES/DRESSINGS) ×1 IMPLANT
GLOVE SRG 8 PF TXTR STRL LF DI (GLOVE) ×1 IMPLANT
GLOVE SURG ENC MOIS LTX SZ8 (GLOVE) ×2 IMPLANT
GLOVE SURG UNDER POLY LF SZ8 (GLOVE) ×2
GOWN STRL REUS W/ TWL LRG LVL3 (GOWN DISPOSABLE) ×2 IMPLANT
GOWN STRL REUS W/ TWL XL LVL3 (GOWN DISPOSABLE) ×1 IMPLANT
GOWN STRL REUS W/TWL LRG LVL3 (GOWN DISPOSABLE) ×4
GOWN STRL REUS W/TWL XL LVL3 (GOWN DISPOSABLE) ×2
HEMOSTAT ARISTA ABSORB 3G PWDR (HEMOSTASIS) ×4 IMPLANT
KIT BASIN OR (CUSTOM PROCEDURE TRAY) ×2 IMPLANT
KIT TURNOVER KIT B (KITS) ×2 IMPLANT
NS IRRIG 1000ML POUR BTL (IV SOLUTION) ×2 IMPLANT
PACK GENERAL/GYN (CUSTOM PROCEDURE TRAY) ×2 IMPLANT
PAD ARMBOARD 7.5X6 YLW CONV (MISCELLANEOUS) ×3 IMPLANT
PENCIL SMOKE EVACUATOR (MISCELLANEOUS) ×2 IMPLANT
PLASMABLADE 3.0S (MISCELLANEOUS)
SPECIMEN JAR X LARGE (MISCELLANEOUS) ×2 IMPLANT
SPONGE DRAIN TRACH 4X4 STRL 2S (GAUZE/BANDAGES/DRESSINGS) ×1 IMPLANT
SPONGE T-LAP 18X18 ~~LOC~~+RFID (SPONGE) ×4 IMPLANT
SUT ETHILON 3 0 FSL (SUTURE) ×2 IMPLANT
SUT MNCRL AB 4-0 PS2 18 (SUTURE) ×2 IMPLANT
SUT VIC AB 0 CT1 18XCR BRD8 (SUTURE) IMPLANT
SUT VIC AB 0 CT1 8-18 (SUTURE) ×4
SUT VIC AB 3-0 SH 18 (SUTURE) ×2 IMPLANT
TAPE CLOTH 4X10 WHT NS (GAUZE/BANDAGES/DRESSINGS) ×1 IMPLANT
TOWEL GREEN STERILE (TOWEL DISPOSABLE) ×2 IMPLANT
TOWEL GREEN STERILE FF (TOWEL DISPOSABLE) ×2 IMPLANT

## 2020-09-20 NOTE — Anesthesia Procedure Notes (Signed)
Procedure Name: LMA Insertion Date/Time: 09/20/2020 2:58 PM Performed by: Annamary Carolin, CRNA Pre-anesthesia Checklist: Patient identified, Emergency Drugs available, Suction available and Patient being monitored Patient Re-evaluated:Patient Re-evaluated prior to induction Oxygen Delivery Method: Circle System Utilized Preoxygenation: Pre-oxygenation with 100% oxygen Induction Type: IV induction Ventilation: Mask ventilation without difficulty LMA: LMA inserted LMA Size: 4.0 Tube type: Oral Number of attempts: 1 Airway Equipment and Method: Stylet and Oral airway Placement Confirmation: ETT inserted through vocal cords under direct vision, positive ETCO2 and breath sounds checked- equal and bilateral Tube secured with: Tape Dental Injury: Teeth and Oropharynx as per pre-operative assessment

## 2020-09-20 NOTE — Op Note (Signed)
Right Modified Radical Mastectomy Procedure Note  Indications: This patient presents for surgical treatment of Breast Cancer.  Patient has completed neoadjuvant chemotherapy for a locally advanced right breast cancer which was invading the chest wall in the skin.  She had a reasonable response but still had bulky adenopathy therefore recommended right modified radical mastectomy.The surgical and non surgical options have been discussed with the patient.  Risks of surgery include bleeding,  Infection,  Flap necrosis,  Tissue loss,  Chronic pain, death, Numbness,  And the need for additional procedures.  Reconstruction options also have been discussed with the patient as well.  The patient agrees to proceed.   Pre-operative Diagnosis: right breast cancer  Post-operative Diagnosis: right breast cancer  Surgeon: Turner Daniels  MD   Assistants: Zacarias Pontes MD   Anesthesia: General endotracheal anesthesia and pec block   ASA Class: 3  Procedure Details  The patient was seen again in the Holding Room. The risks, benefits, indications, potential complications, treatment options, and expected outcomes were discussed with the patient. The possibilities of reaction to medication, pulmonary aspiration, bleeding, recurrent infection, the need for additional procedures, failure to diagnose a condition, and creating a complication requiring transfusion or further operation were discussed with the patient. The patient and/or family concurred with the proposed plan, giving informed consent. The site of surgery was properly noted/marked. The patient was taken to the Operating Room, identified as Elizabeth Chase and the procedure verified as right modified radical mastectomy. A Time Out was held and the above information confirmed.  The patient was placed supine and general anesthetic was administered. The right arm, right breast, and chest were prepped and draped in standard fashion. An oblique elliptical  incision was made encompassing the nipple. Skin flaps were created meticulously to preserve the subdermal blood supply and maintain a clear margin of resection around the tumor. Dissection was carried down to the pectoralis fascia, which was included with the specimen, and an axillary dissection was performed in continuity with the mastectomy.  Of note part of the pectoralis major muscle was resected since this tumor was fixed to it but not all within the chest wall.  This included levels I and II. This was accomplished by exposing the axillary vein anteriorly and inferiorly to the level of the pectoralis minor and laterally. Small venous tributaries, lymphatics, and vessels were clipped and ligated or cauterized and divided. The subscapularis muscle was skeletonized. The long thoracic and thoracodorsal neurovascular bundles were identified and preserved.    The specimen was submitted to pathology. The wound was irrigated. One  J-P drains was  placed. One was placed within the lower flap and second was placed laterally in the axillary dissection region.  Arista applied to the l operative cavity.  The skin incision was closed in layers with a 4-0 Vicryl subcuticular closure. The drains were secured with 2-0 nylon sutures. Dermabond  was applied along with a dry bulky gauze dressing.  Instrument, sponge, and needle counts were correct at closure and at the conclusion of the case.   Findings: See above  Estimated Blood Loss: less than 100 mL         Drains: One JP drain placed as above         Total IV Fluids: Per anesthesia record         Specimens: Breast with axillary contents right  Complications: None; patient tolerated the procedure well.         Disposition: PACU - hemodynamically  stable.         Condition: stable

## 2020-09-20 NOTE — Anesthesia Procedure Notes (Signed)
Anesthesia Regional Block: Pectoralis block   Pre-Anesthetic Checklist: , timeout performed,  Correct Patient, Correct Site, Correct Laterality,  Correct Procedure, Correct Position, site marked,  Risks and benefits discussed,  Surgical consent,  Pre-op evaluation,  At surgeon's request and post-op pain management  Laterality: Right  Prep: chloraprep       Needles:   Needle Type: Stimiplex     Needle Length: 9cm      Additional Needles:   Procedures:,,,, ultrasound used (permanent image in chart),,    Narrative:  Start time: 09/20/2020 1:55 PM End time: 09/20/2020 2:20 PM Injection made incrementally with aspirations every 5 mL.  Performed by: Personally  Anesthesiologist: Nolon Nations, MD  Additional Notes: BP cuff, SpO2 and EKG monitors applied. Sedation begun.  Anesthetic injected incrementally, slowly, and after neg aspirations under direct ultrasound guidance. Good fascial spread noted. Patient tolerated well.

## 2020-09-20 NOTE — H&P (Signed)
MRN: [id number] DOB: 1940/04/17 DATE OF ENCOUNTER: 08/29/2020 Subjective   Chief Complaint: No chief complaint on file.   History of Present Illness: Elizabeth Chase is a 80 y.o. female who is seen today for follow-up right breast cancer locally advanced with ulceration. Elizabeth Chase is finished chemotherapy and has significant reduction in tumor size. Elizabeth Chase still has some element of right axillary adenopathy after treatment. Elizabeth Chase tolerated chemotherapy well..  Review of Systems: A complete review of systems was obtained from the patient. I have reviewed this information and discussed as appropriate with the patient. See HPI as well for other ROS.  Review of Systems  Constitutional: Positive for malaise/fatigue and weight loss.  HENT: Negative.  Eyes: Negative.  Respiratory: Negative.  Cardiovascular: Negative.  Skin: Negative.    Medical History: Past Medical History:  Diagnosis Date   COPD (chronic obstructive pulmonary disease) (CMS-HCC)   GERD (gastroesophageal reflux disease)   History of cancer   History of stroke   There is no problem list on file for this patient.  History reviewed. No pertinent surgical history.   Allergies  Allergen Reactions   Venom-Honey Bee Swelling   Current Outpatient Medications on File Prior to Visit  Medication Sig Dispense Refill   cyanocobalamin (VITAMIN B12) 1000 MCG tablet Take by mouth   albuterol 90 mcg/actuation inhaler TAKE 2 PUFFS BY MOUTH EVERY 6 HOURS AS NEEDED FOR WHEEZE OR SHORTNESS OF BREATH   atorvastatin (LIPITOR) 20 MG tablet Take 20 mg by mouth once daily   traZODone (DESYREL) 300 MG tablet Take 300 mg by mouth nightly   No current facility-administered medications on file prior to visit.   Family History  Problem Relation Age of Onset   Hyperlipidemia (Elevated cholesterol) Mother   Diabetes Mother   Skin cancer Father   Hyperlipidemia (Elevated cholesterol) Father   Hyperlipidemia (Elevated cholesterol) Sister   Diabetes  Sister   Breast cancer Sister    Social History   Tobacco Use  Smoking Status Former Smoker  Smokeless Tobacco Never Used    Social History   Socioeconomic History   Marital status: Widowed  Tobacco Use   Smoking status: Former Smoker   Smokeless tobacco: Never Used  Substance and Sexual Activity   Alcohol use: Never   Drug use: Never   Objective:   There were no vitals filed for this visit.  There is no height or weight on file to calculate BMI.  Physical Exam Constitutional:  Appearance: Normal appearance.  HENT:  Head: Normocephalic and atraumatic.  Mouth/Throat:  Mouth: Mucous membranes are moist.  Eyes:  Pupils: Pupils are equal, round, and reactive to light.  Cardiovascular:  Rate and Rhythm: Normal rate and regular rhythm.  Pulmonary:  Effort: Pulmonary effort is normal.  Breath sounds: Normal breath sounds.  Chest:  Breasts:  Left: Normal.   Lymphadenopathy:  Upper Body:  Right upper body: Axillary adenopathy present.  Left upper body: No axillary adenopathy.  Neurological:  Mental Status: Elizabeth Chase is alert.     Labs, Imaging and Diagnostic Testing:  Malignant neoplasm of right breast in female, estrogen receptor positive (Needles) Stage IIIc fungating tumor of the right breast: ER/PR and HER-2 positive 04/15/20: CT CAP: No distant mets   Treatment Plan: 1. Neoadj chemo with Taxotere Herceptin Perjeta 2. Mastectomy with sentinel node biopsy 3. Adj XRT 4. Adj Anti estrogen therapy  Assessment and Plan:  There are no diagnoses linked to this encounter.  Stage IIIc right breast cancer status post neoadjuvant chemotherapy Patient  has completed chemotherapy. Elizabeth Chase more likely had stage IIIc right breast cancer that responded well to chemo with significant reduction but has significant right axillary adenopathy.  Recommend right modified radical mastectomy for clearance of disease given physical exam findings. Risks and benefits of surgery discussed.  he  surgical and non surgical options have been discussed with the patient. Risks of surgery include bleeding, Infection, Flap necrosis, Tissue loss, Chronic pain, death, Numbness,  Arm swelling And the need for additional procedures. Reconstruction options also have been discussed with the patient as well. The patient agrees to proceed.  No follow-ups on file.  Kennieth Francois, MD   I had direct face-to-face contact with the patient for a total of 30 minutes and greater than 50% of that time was spent providing counseling and/or coordination of care for the patient regardingbreast cancer.

## 2020-09-20 NOTE — Interval H&P Note (Signed)
History and Physical Interval Note:  09/20/2020 1:35 PM  Elizabeth Chase  has presented today for surgery, with the diagnosis of RIGHT BREAST CANCER.  The various methods of treatment have been discussed with the patient and family. After consideration of risks, benefits and other options for treatment, the patient has consented to  Procedure(s): RIGHT MODIFIED RADICAL MASTECTOMY (Right) as a surgical intervention.  The patient's history has been reviewed, patient examined, no change in status, stable for surgery.  I have reviewed the patient's chart and labs.  Questions were answered to the patient's satisfaction.     [place]

## 2020-09-20 NOTE — Progress Notes (Signed)
Charge Nurse, Suezanne Jacquet, checked in pt at [phone]

## 2020-09-20 NOTE — Transfer of Care (Signed)
Immediate Anesthesia Transfer of Care Note  Patient: Elizabeth Chase  Procedure(s) Performed: RIGHT MODIFIED RADICAL MASTECTOMY (Right: Breast)  Patient Location: PACU  Anesthesia Type:General  Level of Consciousness: awake, alert  and oriented  Airway & Oxygen Therapy: Patient Spontanous Breathing  Post-op Assessment: Report given to RN and Post -op Vital signs reviewed and stable  Post vital signs: Reviewed and stable  Last Vitals:  Vitals Value Taken Time  BP 144/58 09/20/20 1738  Temp    Pulse 87 09/20/20 1741  Resp 15 09/20/20 1741  SpO2 92 % 09/20/20 1741  Vitals shown include unvalidated device data.  Last Pain:  Vitals:   09/20/20 1420  TempSrc:   PainSc: 0-No pain         Complications: No notable events documented.

## 2020-09-21 ENCOUNTER — Encounter (HOSPITAL_COMMUNITY): Payer: Self-pay | Admitting: Surgery

## 2020-09-21 DIAGNOSIS — C779 Secondary and unspecified malignant neoplasm of lymph node, unspecified: Secondary | ICD-10-CM | POA: Diagnosis not present

## 2020-09-21 DIAGNOSIS — J449 Chronic obstructive pulmonary disease, unspecified: Secondary | ICD-10-CM | POA: Diagnosis not present

## 2020-09-21 DIAGNOSIS — Z87891 Personal history of nicotine dependence: Secondary | ICD-10-CM | POA: Diagnosis not present

## 2020-09-21 DIAGNOSIS — C50911 Malignant neoplasm of unspecified site of right female breast: Secondary | ICD-10-CM | POA: Diagnosis not present

## 2020-09-21 DIAGNOSIS — Z20822 Contact with and (suspected) exposure to covid-19: Secondary | ICD-10-CM | POA: Diagnosis not present

## 2020-09-21 DIAGNOSIS — Z79899 Other long term (current) drug therapy: Secondary | ICD-10-CM | POA: Diagnosis not present

## 2020-09-21 MED ORDER — OXYCODONE-ACETAMINOPHEN 5-325 MG PO TABS: 1.0000 | ORAL_TABLET | ORAL | 0 refills | Status: DC | PRN

## 2020-09-21 NOTE — Discharge Summary (Signed)
Physician Discharge Summary  Patient ID: Elizabeth Chase MRN: [id number] DOB/AGE: Oct 24, 1940 80 y.o.  Admit date: 09/20/2020 Discharge date: 09/21/2020  Admission [place] 3 breast cancer  Discharge Diagnoses:  Active Problems:   Breast cancer, stage 3, right [facility])   Discharged Condition: good  Hospital Course: pt did well after right MRM.  Flaps were viable, she had no hematoma, and good pain control.  She tolerated her diet and could ambulate.    Consults: None    Treatments: surgery: Right MRM  Discharge Exam: Blood pressure (!) 98/54, pulse 61, temperature 98.6 F (37 C), temperature source Oral, resp. rate 18, height '5\' 5"'$  (1.651 m), weight 54.9 kg, SpO2 95 %. General appearance: alert and cooperative Resp: clear to auscultation bilaterally Breasts: right mastectomy wound CDI flaps viable no hematoma noted drainage serosanguinous  Cardio: regular rate and rhythm, S1, S2 normal, no murmur, click, rub or gallop Skin: Skin color, texture, turgor normal. No rashes or lesions Neurologic: Grossly normal  Disposition: Discharge disposition: 01-Home or Self Care       Discharge Instructions     Diet - low sodium heart healthy   Complete by: As directed    Increase activity slowly   Complete by: As directed       Allergies as of 09/21/2020       Reactions   Bee Venom    unknown        Medication List     TAKE these medications    cholecalciferol 25 MCG (1000 UNIT) tablet Commonly known as: VITAMIN D Take 1,000 Units by mouth in the morning.   loratadine 10 MG tablet Commonly known as: CLARITIN Take 10 mg by mouth See admin instructions. Take 1 tablet (10 mg) by mouth for 4 days after chemotherapy   oxyCODONE-acetaminophen 5-325 MG tablet Commonly known as: PERCOCET/ROXICET Take 1 tablet by mouth every 4 (four) hours as needed for moderate pain.   trazodone 300 MG tablet Commonly known as: DESYREL Take 1 tablet (300 mg total) by mouth at  bedtime.       ASK your doctor about these medications    albuterol 108 (90 Base) MCG/ACT inhaler Commonly known as: VENTOLIN HFA TAKE 2 PUFFS BY MOUTH EVERY 6 HOURS AS NEEDED FOR WHEEZE OR SHORTNESS OF BREATH   atorvastatin 20 MG tablet Commonly known as: LIPITOR Take 1 tablet (20 mg total) by mouth daily at 6 PM.   feeding supplement Liqd Take 237 mLs by mouth 3 (three) times daily between meals. Please give her 90 bottles   lidocaine-prilocaine cream Commonly known as: EMLA Apply to affected area once   ondansetron 8 MG tablet Commonly known as: Zofran Take 1 tablet (8 mg total) by mouth 2 (two) times daily as needed for refractory nausea / vomiting.         Signed: Turner Daniels MD  09/21/2020, 8:28 AM

## 2020-09-21 NOTE — Plan of Care (Signed)

## 2020-09-21 NOTE — Progress Notes (Signed)
Elizabeth Chase to be D/C'd  per MD order.  Discussed with the patient and all questions fully answered.  VSS, Skin clean, dry and intact without evidence of skin break down, no evidence of skin tears noted.  IV catheter discontinued intact. Site without signs and symptoms of complications. Dressing and pressure applied.  An After Visit Summary was printed and given to the patient. Patient received prescription.  D/c education completed with patient/family including follow up instructions, medication list, d/c activities limitations if indicated, with other d/c instructions as indicated by MD - patient able to verbalize understanding, all questions fully answered.   Patient instructed to return to ED, call 911, or call MD for any changes in condition.   Patient to be escorted via [place], and D/C home via private auto.

## 2020-09-21 NOTE — Discharge Instructions (Signed)
[facility]___[facility] [phone]  MASTECTOMY: POST OP INSTRUCTIONS  Always review your discharge instruction sheet given to you by the facility where your surgery was performed. IF YOU HAVE DISABILITY OR FAMILY LEAVE FORMS, YOU MUST BRING THEM TO THE OFFICE FOR PROCESSING.   DO NOT GIVE THEM TO YOUR DOCTOR. A prescription for pain medication may be given to you upon discharge.  Take your pain medication as prescribed, if needed.  If narcotic pain medicine is not needed, then you may take acetaminophen (Tylenol) or ibuprofen (Advil) as needed. Take your usually prescribed medications unless otherwise directed. If you need a refill on your pain medication, please contact your pharmacy.  They will contact our office to request authorization.  Prescriptions will not be filled after 5pm or on week-ends. You should follow a light diet the first few days after arrival home, such as soup and crackers, etc.  Resume your normal diet the day after surgery. Most patients will experience some swelling and bruising on the chest and underarm.  Ice packs will help.  Swelling and bruising can take several days to resolve.  It is common to experience some constipation if taking pain medication after surgery.  Increasing fluid intake and taking a stool softener (such as Colace) will usually help or prevent this problem from occurring.  A mild laxative (Milk of Magnesia or Miralax) should be taken according to package instructions if there are no bowel movements after 48 hours. Unless discharge instructions indicate otherwise, leave your bandage dry and in place until your next appointment in 3-5 days.  You may take a limited sponge bath.  No tube baths or showers until the drains are removed.  You may have steri-strips (small skin tapes) in place directly over the incision.  These strips should be left on the skin for 7-10 days.  If your surgeon used skin glue on the incision, you may shower in 24 hours.   The glue will flake off over the next 2-3 weeks.  Any sutures or staples will be removed at the office during your follow-up visit. DRAINS:  If you have drains in place, it is important to keep a list of the amount of drainage produced each day in your drains.  Before leaving the hospital, you should be instructed on drain care.  Call our office if you have any questions about your drains. ACTIVITIES:  You may resume regular (light) daily activities beginning the next day--such as daily self-care, walking, climbing stairs--gradually increasing activities as tolerated.  You may have sexual intercourse when it is comfortable.  Refrain from any heavy lifting or straining until approved by your doctor. You may drive when you are no longer taking prescription pain medication, you can comfortably wear a seatbelt, and you can safely maneuver your car and apply brakes. RETURN TO WORK:  __________________________________________________________ You should see your doctor in the office for a follow-up appointment approximately 3-5 days after your surgery.  Your doctor's nurse will typically make your follow-up appointment when she calls you with your pathology report.  Expect your pathology report 2-3 business days after your surgery.  You may call to check if you do not hear from us after three days.   OTHER INSTRUCTIONS: ______________________________________________________________________________________________ ____________________________________________________________________________________________ WHEN TO CALL YOUR DOCTOR: Fever over 101.0 Nausea and/or vomiting Extreme swelling or bruising Continued bleeding from incision. Increased pain, redness, or drainage from the incision. The clinic staff is available to answer your questions during regular business hours.  Please don't hesitate   to call and ask to speak to one of the nurses for clinical concerns.  If you have a medical emergency, go to the  nearest emergency room or call 911.  A surgeon from [facility] is always on call at the hospital. [address], [address], [place], [place]  [address] ? [address] [address], [place], [place]   [address] [phone] ? [phone] ? FAX [phone] Web site: [email or web address]  

## 2020-09-21 NOTE — Anesthesia Postprocedure Evaluation (Signed)
Anesthesia Post Note  Patient: Elizabeth Chase  Procedure(s) Performed: RIGHT MODIFIED RADICAL MASTECTOMY (Right: Breast)     Patient location during evaluation: PACU Anesthesia Type: General Level of consciousness: sedated and patient cooperative Pain management: pain level controlled Vital Signs Assessment: post-procedure vital signs reviewed and stable Respiratory status: spontaneous breathing Cardiovascular status: stable Anesthetic complications: no   No notable events documented.  Last Vitals:  Vitals:   09/21/20 0444 09/21/20 0545  BP: (!) 109/56 (!) 98/54  Pulse: 68 61  Resp: 18 18  Temp: 36.7 C 37 C  SpO2: 93% 95%    Last Pain:  Vitals:   09/21/20 0806  TempSrc:   PainSc: [place]

## 2020-09-26 ENCOUNTER — Encounter: Payer: Self-pay | Admitting: *Deleted

## 2020-09-28 NOTE — Progress Notes (Signed)
Patient Care Team: Pcp, No as PCP - General [name], MD as PCP - Cardiology (Cardiology) Mauro Kaufmann, RN as Oncology Nurse Navigator Rockwell Germany, RN as Oncology Nurse Navigator Erroll Luna, MD as Consulting Physician (General Surgery) Nicholas Lose, MD as Consulting Physician (Hematology and Oncology)  DIAGNOSIS:    ICD-10-CM   1. Malignant neoplasm of right breast in female, estrogen receptor positive, unspecified site of breast ([place])  C50.911    Z17.0       SUMMARY OF ONCOLOGIC HISTORY: Oncology History  Malignant neoplasm of right breast in female, estrogen receptor positive ([place])  04/18/2020 Initial Diagnosis   Patient presented to the ED with complaint of syncope and found to have a fungating right breast mass. CT showed a right breast mass, 8.1cm, and right axillary adenopathy. Patient reported a right breast mass present for past 2-3 years. Biopsy showed IDC, grade 3, HER-2 equivocal by IHC, positive by FISH (ratio 3.56), ER+ 95%, PR+ 70%, ,Ki67 25%.    04/18/2020 Cancer Staging   Staging form: Breast, AJCC 8th Edition - Clinical stage from 04/18/2020: Stage IIIB (cT4b, cN1, cM0, G3, ER+, PR+, HER2+) - Signed by Gardenia Phlegm, NP on 04/27/2020 Stage prefix: Initial diagnosis Histologic grading system: 3 grade system   05/13/2020 -  Chemotherapy    Patient is on Treatment Plan: BREAST DOCETAXEL + TRASTUZUMAB + PERTUZUMAB (THP) Q21D         CHIEF COMPLIANT:  Herceptin and Perjeta maintenance  INTERVAL HISTORY: Elizabeth Chase is a 80 y.o. with above-mentioned history of HER-2 positive right breast cancer currently on chemotherapy with Herceptin and Perjeta. She presents to the clinic today for treatment.   ALLERGIES:  is allergic to bee venom.  MEDICATIONS:  Current Outpatient Medications  Medication Sig Dispense Refill   albuterol (VENTOLIN HFA) 108 (90 Base) MCG/ACT inhaler TAKE 2 PUFFS BY MOUTH EVERY 6 HOURS AS NEEDED FOR WHEEZE OR  SHORTNESS OF BREATH (Patient taking differently: Inhale 2 puffs into the lungs every 6 (six) hours as needed for wheezing or shortness of breath.) 8.5 each 2   atorvastatin (LIPITOR) 20 MG tablet Take 1 tablet (20 mg total) by mouth daily at 6 PM. (Patient taking differently: Take 20 mg by mouth in the morning.) 30 tablet 2   cholecalciferol (VITAMIN D) 25 MCG (1000 UNIT) tablet Take 1,000 Units by mouth in the morning.     feeding supplement (BOOST HIGH PROTEIN) LIQD Take 237 mLs by mouth 3 (three) times daily between meals. Please give her 90 bottles (Patient taking differently: Take 0.5 Containers by mouth in the morning and at bedtime. Please give her 90 bottles) 237 mL 6   lidocaine-prilocaine (EMLA) cream Apply to affected area once (Patient taking differently: Apply 1 application topically daily as needed (prior to port being accessed). Apply to affected area once) 30 g 3   loratadine (CLARITIN) 10 MG tablet Take 10 mg by mouth See admin instructions. Take 1 tablet (10 mg) by mouth for 4 days after chemotherapy     ondansetron (ZOFRAN) 8 MG tablet Take 1 tablet (8 mg total) by mouth 2 (two) times daily as needed for refractory nausea / vomiting. (Patient not taking: Reported on 09/19/2020) 30 tablet 1   oxyCODONE-acetaminophen (PERCOCET/ROXICET) 5-325 MG tablet Take 1 tablet by mouth every 4 (four) hours as needed for moderate pain. 15 tablet 0   trazodone (DESYREL) 300 MG tablet Take 1 tablet (300 mg total) by mouth at bedtime. 30 tablet  2   No current facility-administered medications for this visit.    PHYSICAL EXAMINATION: ECOG PERFORMANCE STATUS: 1 - Symptomatic but completely ambulatory  Vitals:   09/29/20 1422  BP: 140/73  Pulse: 77  Resp: 18  Temp: (!) 97.2 F (36.2 C)  SpO2: 94%   Filed Weights   09/29/20 1422  Weight: 119 lb 4.8 oz (54.1 kg)    LABORATORY DATA:  I have reviewed the data as listed CMP Latest Ref Rng & Units 09/20/2020 08/26/2020 08/05/2020  Glucose 70 - 99  mg/dL 110(H) 95 100(H)  BUN 8 - 23 mg/dL _0 Creatinine 0.44 - 1.00 mg/dL 0.76 0.73 0.70  Sodium 135 - 145 mmol/L 139 140 139  Potassium 3.5 - 5.1 mmol/L 3.7 4.0 4.3  Chloride 98 - 111 mmol/L 104 108 108  CO2 22 - 32 mmol/L _1 Calcium 8.9 - 10.3 mg/dL 9.1 8.7(L) 9.0  Total Protein 6.5 - 8.1 g/dL 6.5 6.5 6.8  Total Bilirubin 0.3 - 1.2 mg/dL 0.2(L) 0.4 0.4  Alkaline Phos 38 - 126 U/L 58 65 65  AST 15 - 41 U/L 16 12(L) 12(L)  ALT 0 - 44 U/L 12 7 <6    Lab Results  Component Value Date   WBC 6.9 09/20/2020   HGB 10.2 (L) 09/20/2020   HCT 34.4 (L) 09/20/2020   MCV 85.1 09/20/2020   PLT 265 09/20/2020   NEUTROABS 5.3 09/20/2020    ASSESSMENT & PLAN:  Malignant neoplasm of right breast in female, estrogen receptor positive ([place]) Malignant neoplasm of right breast in female, estrogen receptor positive ([place]) Stage IIIc fungating tumor of the right breast: ER/PR and HER-2 positive 04/15/20: CT CAP: No distant mets   Treatment Plan: 1. Neoadj chemo with Taxotere Herceptin Perjeta x6 cycles completed 08/26/2020 followed by [employer] maintenance 2. 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 Adj XRT 4. Adj Anti estrogen therapy -------------------------------------------------------------------------------------------------- Pathology counseling: I discussed the final pathology report of the patient provided  a copy of this report. I discussed the margins as well as lymph node surgeries. We also discussed the final staging along with previously performed ER/PR and HER-2/neu testing.  Recommendation: 1.  Kadcyla maintenance 2. adjuvant radiation therapy 3.  Followed by adjuvant antiestrogen therapy    No orders of the defined types were placed in this  encounter.  The patient has a good understanding of the overall plan. she agrees with it. she will call with any problems that may develop before the next visit here.  Total time spent: 30 mins including face to face time and time spent for planning, charting and coordination of care  Rulon Eisenmenger, MD, MPH 09/29/2020  I, Thana Ates, am acting as scribe for Dr. Nicholas Lose.  I have reviewed the above documentation for accuracy and completeness, and I agree with the above.

## 2020-09-29 ENCOUNTER — Encounter: Payer: Self-pay | Admitting: *Deleted

## 2020-09-29 ENCOUNTER — Encounter: Payer: Self-pay | Admitting: Licensed Clinical Social Worker

## 2020-09-29 ENCOUNTER — Other Ambulatory Visit: Payer: Self-pay

## 2020-09-29 ENCOUNTER — Inpatient Hospital Stay: Payer: Medicare HMO | Attending: Hematology and Oncology | Admitting: Hematology and Oncology

## 2020-09-29 DIAGNOSIS — Z5112 Encounter for antineoplastic immunotherapy: Secondary | ICD-10-CM | POA: Diagnosis not present

## 2020-09-29 DIAGNOSIS — C50911 Malignant neoplasm of unspecified site of right female breast: Secondary | ICD-10-CM | POA: Insufficient documentation

## 2020-09-29 DIAGNOSIS — Z170 Estrogen receptor positive status [ER+]: Secondary | ICD-10-CM | POA: Diagnosis not present

## 2020-09-29 NOTE — Assessment & Plan Note (Signed)
Malignant neoplasm of right breast in female, estrogen receptor positive ([place]) Stage IIIc fungating tumor of the right breast: ER/PR and HER-2 positive 04/15/20: CT CAP: No distant mets  Treatment Plan: 1. Neoadj chemo withTaxotere Herceptin Perjeta x6 cycles completed 08/26/2020 followed by [employer] maintenance 2. Mastectomywith sentinel node biopsy: 09/20/2020: Residual invasive ductal carcinoma 7.4 cm invades dermis and skin and skeletal muscle, 4/14 lymph nodes positive, margins negative, lymphovascular invasion present, ER 95%, PR 20%, HER2 positive, Ki-67 25% 3. Adj XRT 4. Adj Anti estrogen therapy -------------------------------------------------------------------------------------------------- Pathology counseling: I discussed the final pathology report of the patient provided  a copy of this report. I discussed the margins as well as lymph node surgeries. We also discussed the final staging along with previously performed ER/PR and HER-2/neu testing.  Recommendation: 1.  Kadcyla maintenance 2. adjuvant radiation therapy 3.  Followed by adjuvant antiestrogen therapy

## 2020-09-29 NOTE — Progress Notes (Signed)
DISCONTINUE OFF PATHWAY REGIMEN - Breast   [id number]:THP (Docetaxel + Pertuzumab/Trastuzumab SUBQ) q21 Days:   Cycle 1: A cycle is 21 days:     Pertuzumab-Trastuzumab-hyaluronidase-zzxf      Docetaxel    Cycles 2 and beyond: A cycle is every 21 days:     Pertuzumab-Trastuzumab-hyaluronidase-zzxf      Docetaxel   **Always confirm dose/schedule in your pharmacy ordering system**  REASON: Other Reason PRIOR TREATMENT: Off Pathway: THP (Docetaxel + Pertuzumab/Trastuzumab SUBQ) q21 Days TREATMENT RESPONSE: Partial Response (PR)  START ON PATHWAY REGIMEN - Breast     A cycle is every 21 days:     Ado-trastuzumab emtansine   **Always confirm dose/schedule in your pharmacy ordering system**  Patient Characteristics: Post-Neoadjuvant Therapy and Resection, HER2 Positive, ER Positive, Residual Disease, Adjuvant Targeted Therapy After Neoadjuvant Chemo/Targeted Therapy Therapeutic Status: Post-Neoadjuvant Therapy and Resection Residual Invasive Disease Post-Neoadjuvant Therapy<= Yes ER Status: Positive (+) HER2 Status: Positive (+) PR Status: Positive (+) Intent of Therapy: Curative Intent, Discussed with Patient

## 2020-09-29 NOTE — Progress Notes (Signed)
[place] CSW Progress Note  Holiday representative met with patient and daughter in exam room. Provided last set of [employer].  Pt's daughter updated that they now have a vehicle. She asked about assistance paying for the insurance and stated she will check with Lenise (financial advocate) on if she can receive help from the [employer].  No other needs at this time.    Christeen Douglas , LCSW

## 2020-09-30 ENCOUNTER — Emergency Department (HOSPITAL_BASED_OUTPATIENT_CLINIC_OR_DEPARTMENT_OTHER)
Admission: EM | Admit: 2020-09-30 | Discharge: 2020-09-30 | Disposition: A | Discharge status: Home / Self Care | Payer: Medicare HMO | Attending: Emergency Medicine | Admitting: Emergency Medicine

## 2020-09-30 ENCOUNTER — Other Ambulatory Visit: Payer: Self-pay

## 2020-09-30 ENCOUNTER — Encounter (HOSPITAL_BASED_OUTPATIENT_CLINIC_OR_DEPARTMENT_OTHER): Payer: Self-pay | Admitting: Emergency Medicine

## 2020-09-30 DIAGNOSIS — J449 Chronic obstructive pulmonary disease, unspecified: Secondary | ICD-10-CM | POA: Insufficient documentation

## 2020-09-30 DIAGNOSIS — I10 Essential (primary) hypertension: Secondary | ICD-10-CM | POA: Diagnosis not present

## 2020-09-30 DIAGNOSIS — X58XXXA Exposure to other specified factors, initial encounter: Secondary | ICD-10-CM | POA: Diagnosis not present

## 2020-09-30 DIAGNOSIS — Z8616 Personal history of COVID-19: Secondary | ICD-10-CM | POA: Insufficient documentation

## 2020-09-30 DIAGNOSIS — Z853 Personal history of malignant neoplasm of breast: Secondary | ICD-10-CM | POA: Insufficient documentation

## 2020-09-30 DIAGNOSIS — S21111A Laceration without foreign body of right front wall of thorax without penetration into thoracic cavity, initial encounter: Secondary | ICD-10-CM | POA: Diagnosis not present

## 2020-09-30 DIAGNOSIS — Z87891 Personal history of nicotine dependence: Secondary | ICD-10-CM | POA: Diagnosis not present

## 2020-09-30 DIAGNOSIS — S299XXA Unspecified injury of thorax, initial encounter: Secondary | ICD-10-CM | POA: Diagnosis present

## 2020-09-30 LAB — CBC
HCT: 30.9 % — ABNORMAL LOW (ref 36.0–46.0)
Hemoglobin: 9.5 g/dL — ABNORMAL LOW (ref 12.0–15.0)
MCH: 25.8 pg — ABNORMAL LOW (ref 26.0–34.0)
MCHC: 30.7 g/dL (ref 30.0–36.0)
MCV: 84 fL (ref 80.0–100.0)
Platelets: 276 10*3/uL (ref 150–400)
RBC: 3.68 MIL/uL — ABNORMAL LOW (ref 3.87–5.11)
RDW: 19.8 % — ABNORMAL HIGH (ref 11.5–15.5)
WBC: 5.2 10*3/uL (ref 4.0–10.5)
nRBC: 0 % (ref 0.0–0.2)

## 2020-09-30 LAB — BASIC METABOLIC PANEL
Anion gap: 6 (ref 5–15)
BUN: 18 mg/dL (ref 8–23)
CO2: 28 mmol/L (ref 22–32)
Calcium: 8.7 mg/dL — ABNORMAL LOW (ref 8.9–10.3)
Chloride: 107 mmol/L (ref 98–111)
Creatinine, Ser: 0.76 mg/dL (ref 0.44–1.00)
GFR, Estimated: >60 mL/min (ref >60)
Glucose, Bld: 104 mg/dL — ABNORMAL HIGH (ref 70–99)
Potassium: 4 mmol/L (ref 3.5–5.1)
Sodium: 141 mmol/L (ref 135–145)

## 2020-09-30 MED ORDER — LIDOCAINE-EPINEPHRINE (PF) 2 %-1:200000 IJ SOLN
10.0000 mL | Freq: Once | INTRAMUSCULAR | Status: AC
  Administered 2020-09-30: 10 mL
  Filled 2020-09-30: qty 20

## 2020-09-30 NOTE — ED Triage Notes (Signed)
Patient presents with daughter stating had right breast mastectomy last week, had drain removed this past Wednesday and changed the dressing yesterday and bleeding has not stopped. Daughter placed a binder and bleeding has gone through binder and shirt. Was unable to be seen at [facility] without an appt. Patient is A&Ox4 and ambulatory. No blood thinners. Daughter states patient was stumbling a little when first getting up this morning and normally very steady gait.

## 2020-09-30 NOTE — ED Provider Notes (Signed)
[facility] EMERGENCY DEPT Provider Note   CSN: [id number] Arrival date & time: 09/30/20  1132     History Chief Complaint  Patient presents with   Post-op Problem    Elizabeth Chase is a 80 y.o. female.  Pt presents to the ED today with bleeding from drainage tube site.  Pt had a mastectomy on 8/9 at [facility].  She was d/c on 8/10.  She had a drain that was removed on the 10th.  Since then, it has been bleeding a lot.  It bled through her shirt and her binder.  Pt said she called [facility] and they told her to come to the ED.      Past Medical History:  Diagnosis Date   Abnormal brain MRI    Abnormal glucose level    AKI (acute kidney injury) ([place]) 04/15/2020   Anemia    Blood pressure abnormally low    Breast wound 05/13/2020   Colitis 04/15/2020   COVID-19 virus infection 04/15/2020   Essential hypertension    Homelessness 05/13/2020   Hyperlipidemia    Hypertension    Hypocalcemia    Malignant neoplasm of right breast in female, estrogen receptor positive ([place])    Muscle weakness    Neoplasm of breast, female, malignant ([place])    Port-A-Cath in place 05/13/2020   Prolonged QT interval 04/15/2020   Right thalamic infarction ([place]) 01/01/2017   Stroke ([place])    Syncope and collapse [id number]   Systolic murmur     Patient Active Problem List   Diagnosis Date Noted   Breast cancer, stage 3, right ([place]) 09/20/2020   COPD (chronic obstructive pulmonary disease) ([place]) 08/05/2020   Port-A-Cath in place 05/13/2020   Breast wound 05/13/2020   Homelessness 05/13/2020   Malignant neoplasm of right breast in female, estrogen receptor positive ([place])    Syncope and collapse 04/15/2020   Breast mass, right 04/15/2020   Prolonged QT interval 04/15/2020   AKI (acute kidney injury) ([place]) 04/15/2020   Colitis 04/15/2020   COVID-19 virus infection 04/15/2020   Hyperlipidemia    Abnormal brain MRI    Meningioma ([place])    Right thalamic infarction ([place])  01/01/2017   Essential hypertension     Past Surgical History:  Procedure Laterality Date   IR IMAGING GUIDED PORT INSERTION  04/26/2020   IR US GUIDE VASC ACCESS RIGHT  04/26/2020   MODIFIED MASTECTOMY Right 09/20/2020   Procedure: RIGHT MODIFIED RADICAL MASTECTOMY;  Surgeon: Erroll Luna, MD;  Location: [facility];  Service: General;  Laterality: Right;     OB History   No obstetric history on file.     Family History  Problem Relation Age of Onset   Diabetes Mother    Cancer Father    Lung cancer Father    Breast cancer Sister    Diabetes Sister    Lung cancer Son    Cancer Brother    Diabetes Brother     Social History   Tobacco Use   Smoking status: Former    Packs/day: 1.00    Types: Cigarettes    Quit date: 12/31/2016    Years since quitting: 3.7   Smokeless tobacco: Never  Substance Use Topics   Alcohol use: No   Drug use: No    Home Medications Prior to Admission medications   Medication Sig Start Date End Date Taking? Authorizing Provider  albuterol (VENTOLIN HFA) 108 (90 Base) MCG/ACT inhaler TAKE 2 PUFFS BY MOUTH EVERY 6 HOURS AS NEEDED  FOR WHEEZE OR SHORTNESS OF BREATH Patient taking differently: Inhale 2 puffs into the lungs every 6 (six) hours as needed for wheezing or shortness of breath. 08/12/20   Nicholas Lose, MD  atorvastatin (LIPITOR) 20 MG tablet Take 1 tablet (20 mg total) by mouth daily at 6 PM. Patient taking differently: Take 20 mg by mouth in the morning. 09/12/20   Nicholas Lose, MD  cholecalciferol (VITAMIN D) 25 MCG (1000 UNIT) tablet Take 1,000 Units by mouth in the morning.    [provider]  feeding supplement (BOOST HIGH PROTEIN) LIQD Take 237 mLs by mouth 3 (three) times daily between meals. Please give her 90 bottles Patient taking differently: Take 0.5 Containers by mouth in the morning and at bedtime. Please give her 90 bottles 05/20/20   Nicholas Lose, MD  loratadine (CLARITIN) 10 MG tablet Take 10 mg by mouth See admin  instructions. Take 1 tablet (10 mg) by mouth for 4 days after chemotherapy    [provider]  oxyCODONE-acetaminophen (PERCOCET/ROXICET) 5-325 MG tablet Take 1 tablet by mouth every 4 (four) hours as needed for moderate pain. 09/21/20   Erroll Luna, MD  trazodone (DESYREL) 300 MG tablet Take 1 tablet (300 mg total) by mouth at bedtime. 09/12/20   Nicholas Lose, MD    Allergies    Bee venom  Review of Systems   Review of Systems  Skin:  Positive for wound.  All other systems reviewed and are negative.  Physical Exam Updated Vital Signs BP (!) 145/71 (BP Location: Left Arm)   Pulse 69   Temp 98.2 F (36.8 C) (Oral)   Resp 18   Ht '5\' 5"'$  (1.651 m)   Wt 53.5 kg   SpO2 95%   BMI 19.64 kg/m   Physical Exam Vitals and nursing note reviewed.  Constitutional:      Appearance: Normal appearance.  HENT:     Head: Normocephalic and atraumatic.     Right Ear: External ear normal.     Left Ear: External ear normal.     Nose: Nose normal.     Mouth/Throat:     Mouth: Mucous membranes are moist.     Pharynx: Oropharynx is clear.  Eyes:     Extraocular Movements: Extraocular movements intact.     Conjunctiva/sclera: Conjunctivae normal.     Pupils: Pupils are equal, round, and reactive to light.  Cardiovascular:     Rate and Rhythm: Normal rate and regular rhythm.     Pulses: Normal pulses.     Heart sounds: Normal heart sounds.  Pulmonary:     Effort: Pulmonary effort is normal.     Breath sounds: Normal breath sounds.  Abdominal:     General: Abdomen is flat. Bowel sounds are normal.     Palpations: Abdomen is soft.  Musculoskeletal:        General: Normal range of motion.     Cervical back: Normal range of motion and neck supple.  Skin:    Capillary Refill: Capillary refill takes less than 2 seconds.     Comments: Mastectomy wound looks good.  Drainage site is bleeding.  Neurological:     General: No focal deficit present.     Mental Status: She is alert and  oriented to person, place, and time.  Psychiatric:        Mood and Affect: Mood normal.        Behavior: Behavior normal.    ED Results / Procedures / Treatments   Labs (  all labs ordered are listed, but only abnormal results are displayed) Labs Reviewed  CBC - Abnormal; Notable for the following components:      Result Value   RBC 3.68 (*)    Hemoglobin 9.5 (*)    HCT 30.9 (*)    MCH 25.8 (*)    RDW 19.8 (*)    All other components within normal limits  BASIC METABOLIC PANEL - Abnormal; Notable for the following components:   Glucose, Bld 104 (*)    Calcium 8.7 (*)    All other components within normal limits    EKG None  Radiology No results found.  Procedures .Marland KitchenLaceration Repair  Date/Time: 09/30/2020 12:22 PM Performed by: Isla Pence, MD Authorized by: Isla Pence, MD   Consent:    Consent obtained:  Verbal   Consent given by:  Patient   Risks, benefits, and alternatives were discussed: yes   Universal protocol:    Patient identity confirmed:  Verbally with patient Anesthesia:    Anesthesia method:  Local infiltration   Local anesthetic:  Lidocaine 2% WITH epi Laceration details:    Location:  Trunk   Trunk location:  R chest   Length (cm):  1 Pre-procedure details:    Preparation:  Patient was prepped and draped in usual sterile fashion Exploration:    Hemostasis achieved with:  Epinephrine   Contaminated: no   Treatment:    Area cleansed with:  Povidone-iodine and saline   Amount of cleaning:  Standard Skin repair:    Repair method:  Sutures   Suture size:  4-0   Suture material:  Nylon   Suture technique:  Simple interrupted Post-procedure details:    Dressing:  Non-adherent dressing   Procedure completion:  Tolerated well, no immediate complications   Medications Ordered in ED Medications  lidocaine-EPINEPHrine (XYLOCAINE W/EPI) 2 %-1:200000 (PF) injection 10 mL (10 mLs Infiltration Given 09/30/20 1210)    ED Course  I have  reviewed the triage vital signs and the nursing notes.  Pertinent labs & imaging results that were available during my care of the patient were reviewed by me and considered in my medical decision making (see chart for details).    MDM Rules/Calculators/A&P                           Hgb 9.5.  This is in her normal range.  Pt is instructed to return in 7-10 days for suture removal.   Final Clinical Impression(s) / ED Diagnoses Final diagnoses:  Laceration of chest wall, right, initial encounter    Rx / DC Orders ED Discharge Orders     None        Isla Pence, MD 09/30/20 1227

## 2020-10-03 ENCOUNTER — Telehealth: Payer: Self-pay | Admitting: Hematology and Oncology

## 2020-10-03 NOTE — Telephone Encounter (Signed)
Scheduled per 8/18 los. Called pt and left a msg

## 2020-10-04 ENCOUNTER — Other Ambulatory Visit: Payer: Self-pay | Admitting: *Deleted

## 2020-10-04 ENCOUNTER — Encounter: Payer: Self-pay | Admitting: *Deleted

## 2020-10-04 DIAGNOSIS — C50911 Malignant neoplasm of unspecified site of right female breast: Secondary | ICD-10-CM

## 2020-10-04 DIAGNOSIS — Z170 Estrogen receptor positive status [ER+]: Secondary | ICD-10-CM

## 2020-10-06 ENCOUNTER — Other Ambulatory Visit: Payer: Self-pay | Admitting: *Deleted

## 2020-10-06 DIAGNOSIS — Z170 Estrogen receptor positive status [ER+]: Secondary | ICD-10-CM

## 2020-10-06 DIAGNOSIS — C50911 Malignant neoplasm of unspecified site of right female breast: Secondary | ICD-10-CM

## 2020-10-07 ENCOUNTER — Ambulatory Visit: Payer: Medicare HMO

## 2020-10-07 ENCOUNTER — Other Ambulatory Visit: Payer: Medicare HMO

## 2020-10-07 DIAGNOSIS — Z4801 Encounter for change or removal of surgical wound dressing: Secondary | ICD-10-CM | POA: Diagnosis not present

## 2020-10-07 LAB — SURGICAL PATHOLOGY

## 2020-10-09 ENCOUNTER — Other Ambulatory Visit: Payer: Self-pay | Admitting: Hematology and Oncology

## 2020-10-11 ENCOUNTER — Encounter: Payer: Self-pay | Admitting: *Deleted

## 2020-10-11 ENCOUNTER — Other Ambulatory Visit: Payer: Self-pay

## 2020-10-11 ENCOUNTER — Encounter: Payer: Self-pay | Admitting: Hematology and Oncology

## 2020-10-11 ENCOUNTER — Inpatient Hospital Stay: Payer: Medicare HMO

## 2020-10-11 VITALS — BP 154/71 | HR 71 | Temp 97.8°F | Resp 20

## 2020-10-11 DIAGNOSIS — C50911 Malignant neoplasm of unspecified site of right female breast: Secondary | ICD-10-CM

## 2020-10-11 DIAGNOSIS — Z5112 Encounter for antineoplastic immunotherapy: Secondary | ICD-10-CM | POA: Diagnosis not present

## 2020-10-11 DIAGNOSIS — Z170 Estrogen receptor positive status [ER+]: Secondary | ICD-10-CM | POA: Diagnosis not present

## 2020-10-11 DIAGNOSIS — Z95828 Presence of other vascular implants and grafts: Secondary | ICD-10-CM

## 2020-10-11 LAB — CMP (CANCER CENTER ONLY)
ALT: 11 U/L (ref 0–44)
AST: 15 U/L (ref 15–41)
Albumin: 3.6 g/dL (ref 3.5–5.0)
Alkaline Phosphatase: 74 U/L (ref 38–126)
Anion gap: 8 (ref 5–15)
BUN: 15 mg/dL (ref 8–23)
CO2: 26 mmol/L (ref 22–32)
Calcium: 9 mg/dL (ref 8.9–10.3)
Chloride: 107 mmol/L (ref 98–111)
Creatinine: 0.76 mg/dL (ref 0.44–1.00)
GFR, Estimated: >60 mL/min (ref >60)
Glucose, Bld: 91 mg/dL (ref 70–99)
Potassium: 4.1 mmol/L (ref 3.5–5.1)
Sodium: 141 mmol/L (ref 135–145)
Total Bilirubin: 0.4 mg/dL (ref 0.3–1.2)
Total Protein: 6.9 g/dL (ref 6.5–8.1)

## 2020-10-11 LAB — CBC WITH DIFFERENTIAL (CANCER CENTER ONLY)
Abs Immature Granulocytes: 0 10*3/uL (ref 0.00–0.07)
Basophils Absolute: 0 10*3/uL (ref 0.0–0.1)
Basophils Relative: 1 %
Eosinophils Absolute: 0.2 10*3/uL (ref 0.0–0.5)
Eosinophils Relative: 3 %
HCT: 33.7 % — ABNORMAL LOW (ref 36.0–46.0)
Hemoglobin: 10.2 g/dL — ABNORMAL LOW (ref 12.0–15.0)
Immature Granulocytes: 0 %
Lymphocytes Relative: 22 %
Lymphs Abs: 1.3 10*3/uL (ref 0.7–4.0)
MCH: 25.2 pg — ABNORMAL LOW (ref 26.0–34.0)
MCHC: 30.3 g/dL (ref 30.0–36.0)
MCV: 83.2 fL (ref 80.0–100.0)
Monocytes Absolute: 0.4 10*3/uL (ref 0.1–1.0)
Monocytes Relative: 7 %
Neutro Abs: 3.9 10*3/uL (ref 1.7–7.7)
Neutrophils Relative %: 67 %
Platelet Count: 259 10*3/uL (ref 150–400)
RBC: 4.05 MIL/uL (ref 3.87–5.11)
RDW: 17.5 % — ABNORMAL HIGH (ref 11.5–15.5)
WBC Count: 5.8 10*3/uL (ref 4.0–10.5)
nRBC: 0 % (ref 0.0–0.2)

## 2020-10-11 MED ORDER — SODIUM CHLORIDE 0.9% FLUSH
10.0000 mL | INTRAVENOUS | Status: DC | PRN
  Administered 2020-10-11: 10 mL

## 2020-10-11 MED ORDER — DIPHENHYDRAMINE HCL 25 MG PO CAPS
25.0000 mg | ORAL_CAPSULE | Freq: Once | ORAL | Status: AC
  Administered 2020-10-11: 25 mg via ORAL
  Filled 2020-10-11: qty 1

## 2020-10-11 MED ORDER — ACETAMINOPHEN 325 MG PO TABS
650.0000 mg | ORAL_TABLET | Freq: Once | ORAL | Status: AC
  Administered 2020-10-11: 650 mg via ORAL
  Filled 2020-10-11: qty 2

## 2020-10-11 MED ORDER — SODIUM CHLORIDE 0.9% FLUSH
10.0000 mL | Freq: Once | INTRAVENOUS | Status: AC
  Administered 2020-10-11: 10 mL

## 2020-10-11 MED ORDER — SODIUM CHLORIDE 0.9 % IV SOLN
3.6000 mg/kg | Freq: Once | INTRAVENOUS | Status: AC
  Administered 2020-10-11: 200 mg via INTRAVENOUS
  Filled 2020-10-11: qty 10

## 2020-10-11 MED ORDER — SODIUM CHLORIDE 0.9 % IV SOLN: Freq: Once | INTRAVENOUS | Status: AC

## 2020-10-11 MED ORDER — HEPARIN SOD (PORK) LOCK FLUSH 100 UNIT/ML IV SOLN
500.0000 [IU] | Freq: Once | INTRAVENOUS | Status: AC | PRN
  Administered 2020-10-11: 500 [IU]

## 2020-10-11 NOTE — Progress Notes (Signed)
Pt observed for 1 hr & 1/2 with no signs of distress post Kadcyla.  Tol. Well & d/c to home with daughter.

## 2020-10-11 NOTE — Patient Instructions (Signed)
[facility]  Discharge Instructions: Thank you for choosing [facility] to provide your oncology and hematology care.   If you have a lab appointment with the Cancer Center, please go directly to the Cancer Center and check in at the registration area.   Wear comfortable clothing and clothing appropriate for easy access to any Portacath or PICC line.   We strive to give you quality time with your provider. You may need to reschedule your appointment if you arrive late (15 or more minutes).  Arriving late affects you and other patients whose appointments are after yours.  Also, if you miss three or more appointments without notifying the office, you may be dismissed from the clinic at the provider's discretion.      For prescription refill requests, have your pharmacy contact our office and allow 72 hours for refills to be completed.    Today you received the following chemotherapy and/or immunotherapy agents: Kadcyla.      To help prevent nausea and vomiting after your treatment, we encourage you to take your nausea medication as directed.  BELOW ARE SYMPTOMS THAT SHOULD BE REPORTED IMMEDIATELY: *FEVER GREATER THAN 100.4 F (38 C) OR HIGHER *CHILLS OR SWEATING *NAUSEA AND VOMITING THAT IS NOT CONTROLLED WITH YOUR NAUSEA MEDICATION *UNUSUAL SHORTNESS OF BREATH *UNUSUAL BRUISING OR BLEEDING *URINARY PROBLEMS (pain or burning when urinating, or frequent urination) *BOWEL PROBLEMS (unusual diarrhea, constipation, pain near the anus) TENDERNESS IN MOUTH AND THROAT WITH OR WITHOUT PRESENCE OF ULCERS (sore throat, sores in mouth, or a toothache) UNUSUAL RASH, SWELLING OR PAIN  UNUSUAL VAGINAL DISCHARGE OR ITCHING   Items with * indicate a potential emergency and should be followed up as soon as possible or go to the Emergency Department if any problems should occur.  Please show the CHEMOTHERAPY ALERT CARD or IMMUNOTHERAPY ALERT CARD at check-in to  the Emergency Department and triage nurse.  Should you have questions after your visit or need to cancel or reschedule your appointment, please contact [facility]  Dept: [phone]  and follow the prompts.  Office hours are 8:00 a.m. to 4:30 p.m. Monday - Friday. Please note that voicemails left after 4:00 p.m. may not be returned until the following business day.  We are closed weekends and major holidays. You have access to a nurse at all times for urgent questions. Please call the main number to the clinic Dept: [phone] and follow the prompts.   For any non-urgent questions, you may also contact your provider using MyChart. We now offer e-Visits for anyone 18 and older to request care online for non-urgent symptoms. For details visit [email or web address].   Also download the MyChart app! Go to the app store, search "MyChart", open the app, select [place], and log in with your MyChart username and password.  Due to Covid, a mask is required upon entering the hospital/clinic. If you do not have a mask, one will be given to you upon arrival. For doctor visits, patients may have 1 support person aged 18 or older with them. For treatment visits, patients cannot have anyone with them due to current Covid guidelines and our immunocompromised population.   Ado-Trastuzumab Emtansine for injection What is this medication? ADO-TRASTUZUMAB EMTANSINE (ADD oh traz TOO zuh mab em TAN zine) is a monoclonalantibody combined with chemotherapy. It is used to treat breast cancer. This medicine may be used for other purposes; ask your health care provider orpharmacist if you have   questions. COMMON BRAND NAME(S): Kadcyla What should I tell my care team before I take this medication? They need to know if you have any of these conditions: heart disease heart failure infection (especially a virus infection such as chickenpox, cold sores, or herpes) liver disease lung or  breathing disease, like asthma tingling of the fingers or toes, or other nerve disorder an unusual or allergic reaction to ado-trastuzumab emtansine, other medications, foods, dyes, or preservatives pregnant or trying to get pregnant breast-feeding How should I use this medication? This medicine is for infusion into a vein. It is given by a health careprofessional in a hospital or clinic setting. Talk to your pediatrician regarding the use of this medicine in children.Special care may be needed. Overdosage: If you think you have taken too much of this medicine contact apoison control center or emergency room at once. NOTE: This medicine is only for you. Do not share this medicine with others. What if I miss a dose? It is important not to miss your dose. Call your doctor or health careprofessional if you are unable to keep an appointment. What may interact with this medication? This medicine may also interact with the following medications: atazanavir boceprevir clarithromycin delavirdine indinavir dalfopristin; quinupristin isoniazid, INH itraconazole ketoconazole nefazodone nelfinavir ritonavir telaprevir telithromycin tipranavir voriconazole This list may not describe all possible interactions. Give your health care provider a list of all the medicines, herbs, non-prescription drugs, or dietary supplements you use. Also tell them if you smoke, drink alcohol, or use illegaldrugs. Some items may interact with your medicine. What should I watch for while using this medication? Visit your doctor for checks on your progress. This drug may make you feel generally unwell. This is not uncommon, as chemotherapy can affect healthy cells as well as cancer cells. Report any side effects. Continue your course oftreatment even though you feel ill unless your doctor tells you to stop. You may need blood work done while you are taking this medicine. Call your doctor or health care professional  for advice if you get a fever, chills or sore throat, or other symptoms of a cold or flu. Do not treat yourself. This drug decreases your body's ability to fight infections. Try toavoid being around people who are sick. Be careful brushing and flossing your teeth or using a toothpick because you may get an infection or bleed more easily. If you have any dental work done,tell your dentist you are receiving this medicine. Avoid taking products that contain aspirin, acetaminophen, ibuprofen, naproxen, or ketoprofen unless instructed by your doctor. These medicines may hide afever. Do not become pregnant while taking this medicine or for 7 months after stopping it, men with female partners should use contraception during treatment and for 4 months after the last dose. Women should inform their doctor if they wish to become pregnant or think they might be pregnant. There is a potential for serious side effects to an unborn child. Do not breast-feed an infant whiletaking this medicine or for 7 months after the last dose. Men who have a partner who is pregnant or who is capable of becoming pregnant should use a condom during sexual activity while taking this medicine and for 4 months after stopping it. Men should inform their doctors if they wish to father a child. This medicine may lower sperm counts. Talk to your health careprofessional or pharmacist for more information. What side effects may I notice from receiving this medication? Side effects that you should report to your   doctor or health care professionalas soon as possible: allergic reactions like skin rash, itching or hives, swelling of the face, lips, or tongue breathing problems chest pain or palpitations fever or chills, sore throat general ill feeling or flu-like symptoms light-colored stools nausea, vomiting pain, tingling, numbness in the hands or feet signs and symptoms of bleeding such as bloody or black, tarry stools; red or dark-brown  urine; spitting up blood or brown material that looks like coffee grounds; red spots on the skin; unusual bruising or bleeding from the eye, gums, or nose swelling of the legs or ankles yellowing of the eyes or skin Side effects that usually do not require medical attention (report to yourdoctor or health care professional if they continue or are bothersome): changes in taste constipation dizziness headache joint pain muscle pain trouble sleeping unusually weak or tired This list may not describe all possible side effects. Call your doctor for medical advice about side effects. You may report side effects to FDA at1-800-FDA-1088. Where should I keep my medication? This drug is given in a hospital or clinic and will not be stored at home. NOTE: This sheet is a summary. It may not cover all possible information. If you have questions about this medicine, talk to your doctor, pharmacist, orhealth care provider.  2022 Elsevier/Gold Standard (2017-06-28 10:03:15)  

## 2020-10-13 ENCOUNTER — Telehealth: Payer: Self-pay

## 2020-10-13 ENCOUNTER — Telehealth: Payer: Self-pay | Admitting: *Deleted

## 2020-10-13 NOTE — Telephone Encounter (Signed)
RN received VM from pt daughter [name].  Attempt x1 to return call.  No answer, unable to LVM due to VM being full.

## 2020-10-13 NOTE — Telephone Encounter (Signed)
Pt's daughter called and states pt's right eye is "slightly swollen and a little red." Denies drainage/pain/fever. [name] asks if this is a s/e from chemo. Ilda Mori to [place] warm wet compress to eye and if this does not help or worsens to call us back for appt with Wilber Bihari, LPN tomorrow. [name] verbalized thanks and understanding.

## 2020-10-18 NOTE — Progress Notes (Signed)
Radiation Oncology         [phone] ________________________________  Initial Outpatient Consultation  Name: Elizabeth Chase MRN: [id number]  Date: 10/19/2020  DOB: 1940/04/30  CC:Pcp, No  Serena Croissant, MD   REFERRING PHYSICIAN: Serena Croissant, MD  DIAGNOSIS:    ICD-10-CM   1. Malignant neoplasm of right breast in female, estrogen receptor positive, unspecified site of breast (HCC)  C50.911    Z17.0      Cancer Staging Malignant neoplasm of right breast in female, estrogen receptor positive (HCC) Staging form: Breast, AJCC 8th Edition - Clinical stage from 04/18/2020: Stage IIIB (cT4b, cN1, cM0, G3, ER+, PR+, HER2+) - Signed by Loa Socks, NP on 04/27/2020 Stage prefix: Initial diagnosis Histologic grading system: 3 grade system  ypT4b, pN2a   CHIEF COMPLAINT: Here to discuss management of right breast cancer  HISTORY OF PRESENT ILLNESS::Elizabeth Chase is a 80 y.o. female who presented with breast abnormality when she presented to the ED on 04/14/20 with the chief complaint of syncope. Chest CT taken during her ED course revealed a large right breast mass measuring 8.1 cm as consistent with malignancy and with associated right axillary adenopathy. An additional small right breast nodule measuring 14 x 7 mm was visualized as well.  CT of chest abdomen and pelvis were negative in March for obvious metastatic disease.  CT of the chest was positive however for large breast tumor as depicted below as well as axillary adenopathy.   Biopsy performed on 04/18/20 revealed grade 3 invasive ductal carcinoma. With prognostic indicators significant for ER: 95%, positive PR: 70%, positive, both with strong staining intensity; Her2: positive; Ki67: 25%; Grade 3.   Subsequently, the patient was referred to Dr. Pamelia Hoit on 04/27/20 for consultation. During this visit, Dr. Pamelia Hoit recommended for her to undergo neoadjuvant chemotherapy with Taxotere, Herceptin, and Perjeta, followed by  mastectomy w/ sentinel node biopsy, radiation, and adjuvant anti-estrogen therapy. The patient began her first infusion on 05/13/20. During a follow-up visit with Dr. Pamelia Hoit on 07/15/20, the patient was noted to be tolerating her chemotherapy treatment very well. The tumor was reported to have substantially decreased in size at this time.  The patient proceeded to undergo right modified radical mastectomy on 09/20/20 under the care of Dr. Luisa Hart. Pathology from the procedure revealed: residual invasive ductal carcinoma status (post neoadjuvant therapy), measuring 7.4 cm. Carcinoma was found to directly invade the dermis (with skin ulceration present) and the skeletal muscle. Metastatic carcinoma was also found to involve 4/14 lymph nodes; margins uninvolved by carcinoma, with the closest being 0.6 cm from the posterior margin. ER: 95%, positive, PR: 5% positive, both with strong staining intensity; Her2: negative; Ki67: 25%; Grade 2.  ypT4b, pN2a   Not long after her procedure, the patient presented to the ED on 09/30/20 with bleeding from her drainage tube site (drain was removed on 08/10). The patient stated that the bleeding was excessive since then; having bled through her shirt and binder. She subsequently underwent repair of the laceration on that same date.   She is not in touch w/ PT.  She has f/u with Dr Luisa Hart 9/13.  She is here with her daughter today who reports financial insecurity.  They have not been in touch with her work.  They report food insecurity as well.  They live in [place]  Current Systemic Therapy: docetaxel+ trastuzumab+ pertuzumab (THP) Q21D - first infusion on 05/13/20 under the care of Dr. Pamelia Hoit  PREVIOUS RADIATION THERAPY: No  PAST MEDICAL HISTORY:  has a past medical history of Abnormal brain MRI, Abnormal glucose level, AKI (acute kidney injury) ([place]) (04/15/2020), Anemia, Blood pressure abnormally low, Breast wound (05/13/2020), Colitis (04/15/2020),  COVID-19 virus infection (04/15/2020), Essential hypertension, Homelessness (05/13/2020), Hyperlipidemia, Hypertension, Hypocalcemia, Malignant neoplasm of right breast in female, estrogen receptor positive ([place]), Muscle weakness, Neoplasm of breast, female, malignant ([place]), Port-A-Cath in place (05/13/2020), Prolonged QT interval (04/15/2020), Right thalamic infarction ([place]) (01/01/2017), Stroke ([place]), Syncope and collapse (61/44/3154), and Systolic murmur.    PAST SURGICAL HISTORY: Past Surgical History:  Procedure Laterality Date   IR IMAGING GUIDED PORT INSERTION  04/26/2020   IR US GUIDE VASC ACCESS RIGHT  04/26/2020   MODIFIED MASTECTOMY Right 09/20/2020   Procedure: RIGHT MODIFIED RADICAL MASTECTOMY;  Surgeon: Erroll Luna, MD;  Location: [facility];  Service: General;  Laterality: Right;    FAMILY HISTORY: family history includes Breast cancer in her sister; Cancer in her brother and father; Diabetes in her brother, mother, and sister; Lung cancer in her father and son.  SOCIAL HISTORY:  reports that she quit smoking about 3 years ago. Her smoking use included cigarettes. She smoked an average of 1 pack per day. She has never used smokeless tobacco. She reports that she does not drink alcohol and does not use drugs.  ALLERGIES: Bee venom  MEDICATIONS:  Current Outpatient Medications  Medication Sig Dispense Refill   albuterol (VENTOLIN HFA) 108 (90 Base) MCG/ACT inhaler TAKE 2 PUFFS BY MOUTH EVERY 6 HOURS AS NEEDED FOR WHEEZE OR SHORTNESS OF BREATH (Patient taking differently: Inhale 2 puffs into the lungs every 6 (six) hours as needed for wheezing or shortness of breath.) 8.5 each 2   atorvastatin (LIPITOR) 20 MG tablet Take 1 tablet (20 mg total) by mouth daily at 6 PM. (Patient taking differently: Take 20 mg by mouth in the morning.) 30 tablet 2   cholecalciferol (VITAMIN D) 25 MCG (1000 UNIT) tablet Take 1,000 Units by mouth in the morning.     feeding supplement (BOOST HIGH PROTEIN)  LIQD Take 237 mLs by mouth 3 (three) times daily between meals. Please give her 90 bottles (Patient taking differently: Take 0.5 Containers by mouth in the morning and at bedtime. Please give her 90 bottles) 237 mL 6   loratadine (CLARITIN) 10 MG tablet Take 10 mg by mouth See admin instructions. Take 1 tablet (10 mg) by mouth for 4 days after chemotherapy     oxyCODONE-acetaminophen (PERCOCET/ROXICET) 5-325 MG tablet Take 1 tablet by mouth every 4 (four) hours as needed for moderate pain. 15 tablet 0   trazodone (DESYREL) 300 MG tablet TAKE 1 TABLET BY MOUTH EVERYDAY AT BEDTIME 90 tablet 0   No current facility-administered medications for this encounter.    REVIEW OF SYSTEMS: As above   PHYSICAL EXAM:  height is $RemoveB'5\' 5"'eyUNdvYD$  (1.651 m) and weight is 117 lb 12.8 oz (53.4 kg). Her temperature is 98.7 F (37.1 C). Her blood pressure is 161/74 (abnormal) and her pulse is 73. Her respiration is 20 and oxygen saturation is 94%.   General: Alert and oriented, in no acute distress HEENT: Head is normocephalic. Extraocular movements are intact.  Resolving alopecia. Neck: Neck is supple, no palpable cervical or supraclavicular lymphadenopathy. Heart: Regular in rate and rhythm with no murmurs, rubs, or gallops. Chest: Clear to auscultation bilaterally, with no rhonchi, wheezes, or rales. Extremities: No cyanosis or edema. Lymphatics: see Neck Exam Psychiatric: Judgment and insight are intact. Affect is appropriate. Breasts: Status post right  mastectomy with intact mastectomy scar.  There is a significant amount of fluid in the mastectomy bed. MSK: Good range of motion in shoulders  ECOG = 1  0 - Asymptomatic (Fully active, able to carry on all predisease activities without restriction)  1 - Symptomatic but completely ambulatory (Restricted in physically strenuous activity but ambulatory and able to carry out work of a light or sedentary nature. For example, light housework, office work)  2 -  Symptomatic, <50% in bed during the day (Ambulatory and capable of all self care but unable to carry out any work activities. Up and about more than 50% of waking hours)  3 - Symptomatic, >50% in bed, but not bedbound (Capable of only limited self-care, confined to bed or chair 50% or more of waking hours)  4 - Bedbound (Completely disabled. Cannot carry on any self-care. Totally confined to bed or chair)  5 - Death   Eustace Pen MM, Creech RH, Tormey DC, et al. [phone]). "Toxicity and response criteria of the [facility]". [place] Oncol. 5 (6): 649-55   LABORATORY DATA:  Lab Results  Component Value Date   WBC 5.8 10/11/2020   HGB 10.2 (L) 10/11/2020   HCT 33.7 (L) 10/11/2020   MCV 83.2 10/11/2020   PLT 259 10/11/2020   CMP     Component Value Date/Time   NA 141 10/11/2020 1122   K 4.1 10/11/2020 1122   CL 107 10/11/2020 1122   CO2 26 10/11/2020 1122   GLUCOSE 91 10/11/2020 1122   BUN 15 10/11/2020 1122   CREATININE 0.76 10/11/2020 1122   CALCIUM 9.0 10/11/2020 1122   PROT 6.9 10/11/2020 1122   ALBUMIN 3.6 10/11/2020 1122   AST 15 10/11/2020 1122   ALT 11 10/11/2020 1122   ALKPHOS 74 10/11/2020 1122   BILITOT 0.4 10/11/2020 1122   GFRNONAA >60 10/11/2020 1122   GFRAA >60 01/02/2017 0228        RADIOGRAPHY: As above.  I personally reviewed her imaging    IMPRESSION/PLAN: Locally advanced right breast cancer  It was a pleasure meeting the patient today. We discussed the risks, benefits, and side effects of radiotherapy. I recommend radiotherapy to the right chest wall and regional nodes to reduce her risk of locoregional recurrence by 2/3.  We discussed that radiation would take approximately 6 weeks to complete.  I have contacted Dr. Brantley Stage about the fluid in her surgical bed.  I have asked him to move her up in his schedule if feasible.  She may need to have this fluid drained before we plan her radiation.  We will tentatively plan her radiation CT  simulation in approximately 3 weeks but this can be moved back if needed.  I will also refer to physical therapy for lymphedema prevention  Today I contacted our social worker who spoke with the patient and her daughter about extra layers of financial support and nutritional support.  Today we spoke about acute effects including skin irritation/peeling and fatigue as well as much less common late effects including internal tissue /organ injury or irritation (such as injury to the lung, heart, nerves, rib cage). We spoke about the latest technology that is used to minimize the risk of late effects for patients undergoing radiotherapy to the breast or chest wall. No guarantees of treatment were given. The patient is enthusiastic about proceeding with treatment. I look forward to participating in the patient's care.  Consent form was signed and placed in her chart  Will schedule in 3 weeks for CT simulation but may move this back if needed based on Dr. Josetta Huddle feedback.  On date of service, in total, I spent 60 minutes on this encounter. Patient was seen in person.   __________________________________________   Eppie Gibson, MD  This document serves as a record of services personally performed by Eppie Gibson, MD. It was created on her behalf by Roney Mans, a trained medical scribe. The creation of this record is based on the scribe's personal observations and the provider's statements to them. This document has been checked and approved by the attending provider.

## 2020-10-19 ENCOUNTER — Encounter: Payer: Self-pay | Admitting: *Deleted

## 2020-10-19 ENCOUNTER — Encounter: Payer: Self-pay | Admitting: Radiation Oncology

## 2020-10-19 ENCOUNTER — Other Ambulatory Visit: Payer: Self-pay

## 2020-10-19 ENCOUNTER — Ambulatory Visit
Admission: RE | Admit: 2020-10-19 | Discharge: 2020-10-19 | Disposition: A | Discharge status: Home / Self Care | Payer: Medicare HMO | Source: Ambulatory Visit | Attending: Radiation Oncology | Admitting: Radiation Oncology

## 2020-10-19 VITALS — BP 161/74 | HR 73 | Temp 98.7°F | Resp 20 | Ht 65.0 in | Wt 117.8 lb

## 2020-10-19 DIAGNOSIS — C50111 Malignant neoplasm of central portion of right female breast: Secondary | ICD-10-CM

## 2020-10-19 DIAGNOSIS — Z87891 Personal history of nicotine dependence: Secondary | ICD-10-CM | POA: Insufficient documentation

## 2020-10-19 DIAGNOSIS — Z8673 Personal history of transient ischemic attack (TIA), and cerebral infarction without residual deficits: Secondary | ICD-10-CM | POA: Diagnosis not present

## 2020-10-19 DIAGNOSIS — Z803 Family history of malignant neoplasm of breast: Secondary | ICD-10-CM | POA: Insufficient documentation

## 2020-10-19 DIAGNOSIS — C50911 Malignant neoplasm of unspecified site of right female breast: Secondary | ICD-10-CM

## 2020-10-19 DIAGNOSIS — Z9011 Acquired absence of right breast and nipple: Secondary | ICD-10-CM | POA: Insufficient documentation

## 2020-10-19 DIAGNOSIS — Z5941 Food insecurity: Secondary | ICD-10-CM | POA: Diagnosis not present

## 2020-10-19 DIAGNOSIS — Z809 Family history of malignant neoplasm, unspecified: Secondary | ICD-10-CM | POA: Diagnosis not present

## 2020-10-19 DIAGNOSIS — N179 Acute kidney failure, unspecified: Secondary | ICD-10-CM | POA: Diagnosis not present

## 2020-10-19 DIAGNOSIS — Z170 Estrogen receptor positive status [ER+]: Secondary | ICD-10-CM | POA: Diagnosis not present

## 2020-10-19 DIAGNOSIS — Z8616 Personal history of COVID-19: Secondary | ICD-10-CM | POA: Diagnosis not present

## 2020-10-19 DIAGNOSIS — R011 Cardiac murmur, unspecified: Secondary | ICD-10-CM | POA: Insufficient documentation

## 2020-10-19 DIAGNOSIS — D649 Anemia, unspecified: Secondary | ICD-10-CM | POA: Diagnosis not present

## 2020-10-19 DIAGNOSIS — I10 Essential (primary) hypertension: Secondary | ICD-10-CM | POA: Insufficient documentation

## 2020-10-19 DIAGNOSIS — Z79899 Other long term (current) drug therapy: Secondary | ICD-10-CM | POA: Diagnosis not present

## 2020-10-19 DIAGNOSIS — Z801 Family history of malignant neoplasm of trachea, bronchus and lung: Secondary | ICD-10-CM | POA: Diagnosis not present

## 2020-10-19 DIAGNOSIS — E785 Hyperlipidemia, unspecified: Secondary | ICD-10-CM | POA: Insufficient documentation

## 2020-10-19 NOTE — Progress Notes (Signed)
New Breast Cancer Diagnosis: Right Breast  Did patient present with symptoms (if so, please note symptoms) or screening mammography?: Patient presented to the ED in March 2022 with complaint of syncope and was found to have a fungating right breast mass.  She reports the right breast mass was present for the past 2-3 years.   Location and Extent of disease :right breast, measured  8.1 cm in greatest dimension. Adenopathy yes.  Histology per Pathology Report: grade 3, Invasive Ductal Carcinoma  Receptor Status: ER(positive), PR (positive), Her2-neu (positive), Ki-(25%)  Surgeon and surgical plan, if any:  Dr. Brantley Stage -Right Modified Radical Mastectomy 09/20/2020 -Drain was removed 09/30/2020 -Follow-up 10/25/2020   Medical oncologist, treatment if any:   Dr. Lindi Adie 09/29/2020 -Treatment Plan: 1. Neoadj chemo with Taxotere Herceptin Perjeta x6 cycles completed 08/26/2020 followed by [employer] maintenance 2. 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 Adj XRT 4. Adj Anti estrogen therapy   Family History of Breast/Ovarian/Prostate Cancer: Sister had breast cancer  Lymphedema issues, if any: No      Pain issues, if any: No     SAFETY ISSUES: Prior radiation? No Pacemaker/ICD? No Possible current pregnancy? Postmenopausal Is the patient on methotrexate? No  Current Complaints / other details:

## 2020-10-19 NOTE — Progress Notes (Signed)
[place] Work  Clinical Social Work met with patient and patient's daughter in Radiation Oncology exam room. Patient's daughter, [name], requested [employer] gas card. CSW notified patient/daughter that they exceeded their limit. Patient/ daughter plan to follow up with financial counselors regarding [employer].  CSW provided patient with a [employer] bag and Ensure samples. CSW made referral to [place] through [facility].    Gwinda Maine, LCSW  Clinical Social Worker [facility]

## 2020-10-25 ENCOUNTER — Encounter: Payer: Self-pay | Admitting: *Deleted

## 2020-10-27 ENCOUNTER — Ambulatory Visit: Payer: Medicare HMO | Admitting: Hematology and Oncology

## 2020-10-27 ENCOUNTER — Ambulatory Visit: Payer: Medicare HMO

## 2020-10-27 ENCOUNTER — Other Ambulatory Visit: Payer: Medicare HMO

## 2020-10-29 ENCOUNTER — Other Ambulatory Visit: Payer: Self-pay | Admitting: Hematology and Oncology

## 2020-11-02 ENCOUNTER — Other Ambulatory Visit: Payer: Self-pay

## 2020-11-02 DIAGNOSIS — C50911 Malignant neoplasm of unspecified site of right female breast: Secondary | ICD-10-CM

## 2020-11-03 NOTE — Progress Notes (Signed)
Patient Care Team: Pcp, No as PCP - General Corky Crafts, MD as PCP - Cardiology (Cardiology) Pershing Proud, RN as Oncology Nurse Navigator Donnelly Angelica, RN as Oncology Nurse Navigator Harriette Bouillon, MD as Consulting Physician (General Surgery) Serena Croissant, MD as Consulting Physician (Hematology and Oncology)  DIAGNOSIS:    ICD-10-CM   1. Malignant neoplasm of right breast in female, estrogen receptor positive, unspecified site of breast (HCC)  C50.911    Z17.0       SUMMARY OF ONCOLOGIC HISTORY: Oncology History  Malignant neoplasm of right breast in female, estrogen receptor positive (HCC)  04/18/2020 Initial Diagnosis   Patient presented to the ED with complaint of syncope and found to have a fungating right breast mass. CT showed a right breast mass, 8.1cm, and right axillary adenopathy. Patient reported a right breast mass present for past 2-3 years. Biopsy showed IDC, grade 3, HER-2 equivocal by IHC, positive by FISH (ratio 3.56), ER+ 95%, PR+ 70%, ,Ki67 25%.    04/18/2020 Cancer Staging   Staging form: Breast, AJCC 8th Edition - Clinical stage from 04/18/2020: Stage IIIB (cT4b, cN1, cM0, G3, ER+, PR+, HER2+) - Signed by Loa Socks, NP on 04/27/2020 Stage prefix: Initial diagnosis Histologic grading system: 3 grade system   05/13/2020 - 08/29/2020 Chemotherapy          10/11/2020 -  Chemotherapy    Patient is on Treatment Plan: BREAST ADO-TRASTUZUMAB EMTANSINE (KADCYLA) Q21D         CHIEF COMPLIANT: Kadcyla cycle 2 maintenance  INTERVAL HISTORY: Elizabeth Chase is a 80 y.o. with above-mentioned history of HER-2 positive right breast cancer currently on chemotherapy with Kadcyla. She presents to the clinic today for treatment.  Today cycle 2 of her treatment and she tolerated cycle 1 extremely well without any problems or concerns.  ALLERGIES:  is allergic to bee venom.  MEDICATIONS:  Current Outpatient Medications  Medication Sig Dispense  Refill   albuterol (VENTOLIN HFA) 108 (90 Base) MCG/ACT inhaler TAKE 2 PUFFS BY MOUTH EVERY 6 HOURS AS NEEDED FOR WHEEZE OR SHORTNESS OF BREATH 8.5 each 2   atorvastatin (LIPITOR) 20 MG tablet Take 1 tablet (20 mg total) by mouth daily at 6 PM. (Patient taking differently: Take 20 mg by mouth in the morning.) 30 tablet 2   cholecalciferol (VITAMIN D) 25 MCG (1000 UNIT) tablet Take 1,000 Units by mouth in the morning.     feeding supplement (BOOST HIGH PROTEIN) LIQD Take 237 mLs by mouth 3 (three) times daily between meals. Please give her 90 bottles (Patient taking differently: Take 0.5 Containers by mouth in the morning and at bedtime. Please give her 90 bottles) 237 mL 6   loratadine (CLARITIN) 10 MG tablet Take 10 mg by mouth See admin instructions. Take 1 tablet (10 mg) by mouth for 4 days after chemotherapy     trazodone (DESYREL) 300 MG tablet TAKE 1 TABLET BY MOUTH EVERYDAY AT BEDTIME 90 tablet 0   No current facility-administered medications for this visit.    PHYSICAL EXAMINATION: ECOG PERFORMANCE STATUS: 1 - Symptomatic but completely ambulatory  Vitals:   11/04/20 0916  BP: (!) 171/61  Pulse: 74  Resp: 17  Temp: (!) 97.3 F (36.3 C)  SpO2: 95%   Filed Weights   11/04/20 0916  Weight: 117 lb 3.2 oz (53.2 kg)    LABORATORY DATA:  I have reviewed the data as listed CMP Latest Ref Rng & Units 11/04/2020 10/11/2020 09/30/2020  Glucose  70 - 99 mg/dL 101(H) 91 104(H)  BUN 8 - 23 mg/dL $Remove'21 15 18  'NcNGnvH$ Creatinine 0.44 - 1.00 mg/dL 0.78 0.76 0.76  Sodium 135 - 145 mmol/L 141 141 141  Potassium 3.5 - 5.1 mmol/L 3.9 4.1 4.0  Chloride 98 - 111 mmol/L 107 107 107  CO2 22 - 32 mmol/L $RemoveB'26 26 28  'SikldNIe$ Calcium 8.9 - 10.3 mg/dL 9.0 9.0 8.7(L)  Total Protein 6.5 - 8.1 g/dL 6.8 6.9 -  Total Bilirubin 0.3 - 1.2 mg/dL 0.3 0.4 -  Alkaline Phos 38 - 126 U/L 71 74 -  AST 15 - 41 U/L 18 15 -  ALT 0 - 44 U/L 13 11 -    Lab Results  Component Value Date   WBC 4.1 11/04/2020   HGB 10.2 (L) 11/04/2020    HCT 33.1 (L) 11/04/2020   MCV 81.7 11/04/2020   PLT 232 11/04/2020   NEUTROABS 2.6 11/04/2020    ASSESSMENT & PLAN:  Malignant neoplasm of right breast in female, estrogen receptor positive ([place]) Stage IIIc fungating tumor of the right breast: ER/PR and HER-2 positive 04/15/20: CT CAP: No distant mets   Treatment Plan: 1. Neoadj chemo with Taxotere Herceptin Perjeta x6 cycles completed 08/26/2020 followed by Steward Drone maintenance 2. 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 Adj XRT 4. Adj Anti estrogen therapy -------------------------------------------------------------------------------------------------- Current treatment: Kadcyla maintenance started 10/11/2020 Kadcyla toxicities: Denies any adverse effects to Kadcyla Chemotherapy-induced anemia: Monitoring Monitoring for neuropathy as well.  Return to clinic every 3 weeks for Kadcyla and every 6 weeks with follow-up with me.    No orders of the defined types were placed in this encounter.  The patient has a good understanding of the overall plan. she agrees with it. she will call with any problems that may develop before the next visit here.  Total time spent: 30 mins including face to face time and time spent for planning, charting and coordination of care  Rulon Eisenmenger, MD, MPH 11/04/2020  I, Thana Ates, am acting as scribe for Dr. Nicholas Lose.  I have reviewed the above documentation for accuracy and completeness, and I agree with the above.

## 2020-11-04 ENCOUNTER — Encounter: Payer: Self-pay | Admitting: Dietician

## 2020-11-04 ENCOUNTER — Inpatient Hospital Stay (HOSPITAL_BASED_OUTPATIENT_CLINIC_OR_DEPARTMENT_OTHER): Payer: Medicare HMO | Admitting: Hematology and Oncology

## 2020-11-04 ENCOUNTER — Other Ambulatory Visit: Payer: Self-pay

## 2020-11-04 ENCOUNTER — Inpatient Hospital Stay: Payer: Medicare HMO

## 2020-11-04 ENCOUNTER — Inpatient Hospital Stay: Payer: Medicare HMO | Attending: Hematology and Oncology

## 2020-11-04 DIAGNOSIS — Z170 Estrogen receptor positive status [ER+]: Secondary | ICD-10-CM | POA: Diagnosis not present

## 2020-11-04 DIAGNOSIS — C50911 Malignant neoplasm of unspecified site of right female breast: Secondary | ICD-10-CM

## 2020-11-04 DIAGNOSIS — Z95828 Presence of other vascular implants and grafts: Secondary | ICD-10-CM

## 2020-11-04 DIAGNOSIS — Z5112 Encounter for antineoplastic immunotherapy: Secondary | ICD-10-CM | POA: Insufficient documentation

## 2020-11-04 LAB — CBC WITH DIFFERENTIAL (CANCER CENTER ONLY)
Abs Immature Granulocytes: 0.01 10*3/uL (ref 0.00–0.07)
Basophils Absolute: 0 10*3/uL (ref 0.0–0.1)
Basophils Relative: 1 %
Eosinophils Absolute: 0.1 10*3/uL (ref 0.0–0.5)
Eosinophils Relative: 3 %
HCT: 33.1 % — ABNORMAL LOW (ref 36.0–46.0)
Hemoglobin: 10.2 g/dL — ABNORMAL LOW (ref 12.0–15.0)
Immature Granulocytes: 0 %
Lymphocytes Relative: 25 %
Lymphs Abs: 1 10*3/uL (ref 0.7–4.0)
MCH: 25.2 pg — ABNORMAL LOW (ref 26.0–34.0)
MCHC: 30.8 g/dL (ref 30.0–36.0)
MCV: 81.7 fL (ref 80.0–100.0)
Monocytes Absolute: 0.3 10*3/uL (ref 0.1–1.0)
Monocytes Relative: 8 %
Neutro Abs: 2.6 10*3/uL (ref 1.7–7.7)
Neutrophils Relative %: 63 %
Platelet Count: 232 10*3/uL (ref 150–400)
RBC: 4.05 MIL/uL (ref 3.87–5.11)
RDW: 15.7 % — ABNORMAL HIGH (ref 11.5–15.5)
WBC Count: 4.1 10*3/uL (ref 4.0–10.5)
nRBC: 0 % (ref 0.0–0.2)

## 2020-11-04 LAB — CMP (CANCER CENTER ONLY)
ALT: 13 U/L (ref 0–44)
AST: 18 U/L (ref 15–41)
Albumin: 3.5 g/dL (ref 3.5–5.0)
Alkaline Phosphatase: 71 U/L (ref 38–126)
Anion gap: 8 (ref 5–15)
BUN: 21 mg/dL (ref 8–23)
CO2: 26 mmol/L (ref 22–32)
Calcium: 9 mg/dL (ref 8.9–10.3)
Chloride: 107 mmol/L (ref 98–111)
Creatinine: 0.78 mg/dL (ref 0.44–1.00)
GFR, Estimated: >60 mL/min (ref >60)
Glucose, Bld: 101 mg/dL — ABNORMAL HIGH (ref 70–99)
Potassium: 3.9 mmol/L (ref 3.5–5.1)
Sodium: 141 mmol/L (ref 135–145)
Total Bilirubin: 0.3 mg/dL (ref 0.3–1.2)
Total Protein: 6.8 g/dL (ref 6.5–8.1)

## 2020-11-04 MED ORDER — ACETAMINOPHEN 325 MG PO TABS
650.0000 mg | ORAL_TABLET | Freq: Once | ORAL | Status: AC
  Administered 2020-11-04: 650 mg via ORAL

## 2020-11-04 MED ORDER — ACETAMINOPHEN 325 MG PO TABS
ORAL_TABLET | ORAL | Status: AC
  Filled 2020-11-04: qty 2

## 2020-11-04 MED ORDER — SODIUM CHLORIDE 0.9% FLUSH
10.0000 mL | Freq: Once | INTRAVENOUS | Status: AC
  Administered 2020-11-04: 10 mL

## 2020-11-04 MED ORDER — SODIUM CHLORIDE 0.9 % IV SOLN: Freq: Once | INTRAVENOUS | Status: AC

## 2020-11-04 MED ORDER — HEPARIN SOD (PORK) LOCK FLUSH 100 UNIT/ML IV SOLN
500.0000 [IU] | Freq: Once | INTRAVENOUS | Status: AC | PRN
  Administered 2020-11-04: 500 [IU]

## 2020-11-04 MED ORDER — SODIUM CHLORIDE 0.9% FLUSH
10.0000 mL | INTRAVENOUS | Status: DC | PRN
  Administered 2020-11-04: 10 mL

## 2020-11-04 MED ORDER — DIPHENHYDRAMINE HCL 25 MG PO CAPS
ORAL_CAPSULE | ORAL | Status: AC
  Filled 2020-11-04: qty 1

## 2020-11-04 MED ORDER — DIPHENHYDRAMINE HCL 25 MG PO CAPS
25.0000 mg | ORAL_CAPSULE | Freq: Once | ORAL | Status: AC
  Administered 2020-11-04: 25 mg via ORAL

## 2020-11-04 MED ORDER — SODIUM CHLORIDE 0.9 % IV SOLN
3.6000 mg/kg | Freq: Once | INTRAVENOUS | Status: AC
  Administered 2020-11-04: 200 mg via INTRAVENOUS
  Filled 2020-11-04: qty 10

## 2020-11-04 NOTE — Patient Instructions (Signed)
[facility]  Discharge Instructions: Thank you for choosing [facility] to provide your oncology and hematology care.   If you have a lab appointment with the Cancer Center, please go directly to the Cancer Center and check in at the registration area.   Wear comfortable clothing and clothing appropriate for easy access to any Portacath or PICC line.   We strive to give you quality time with your provider. You may need to reschedule your appointment if you arrive late (15 or more minutes).  Arriving late affects you and other patients whose appointments are after yours.  Also, if you miss three or more appointments without notifying the office, you may be dismissed from the clinic at the provider's discretion.      For prescription refill requests, have your pharmacy contact our office and allow 72 hours for refills to be completed.    Today you received the following chemotherapy and/or immunotherapy agents Ado-Trastuzumab Emtansine.      To help prevent nausea and vomiting after your treatment, we encourage you to take your nausea medication as directed.  BELOW ARE SYMPTOMS THAT SHOULD BE REPORTED IMMEDIATELY: *FEVER GREATER THAN 100.4 F (38 C) OR HIGHER *CHILLS OR SWEATING *NAUSEA AND VOMITING THAT IS NOT CONTROLLED WITH YOUR NAUSEA MEDICATION *UNUSUAL SHORTNESS OF BREATH *UNUSUAL BRUISING OR BLEEDING *URINARY PROBLEMS (pain or burning when urinating, or frequent urination) *BOWEL PROBLEMS (unusual diarrhea, constipation, pain near the anus) TENDERNESS IN MOUTH AND THROAT WITH OR WITHOUT PRESENCE OF ULCERS (sore throat, sores in mouth, or a toothache) UNUSUAL RASH, SWELLING OR PAIN  UNUSUAL VAGINAL DISCHARGE OR ITCHING   Items with * indicate a potential emergency and should be followed up as soon as possible or go to the Emergency Department if any problems should occur.  Please show the CHEMOTHERAPY ALERT CARD or IMMUNOTHERAPY ALERT CARD  at check-in to the Emergency Department and triage nurse.  Should you have questions after your visit or need to cancel or reschedule your appointment, please contact [facility]  Dept: [phone]  and follow the prompts.  Office hours are 8:00 a.m. to 4:30 p.m. Monday - Friday. Please note that voicemails left after 4:00 p.m. may not be returned until the following business day.  We are closed weekends and major holidays. You have access to a nurse at all times for urgent questions. Please call the main number to the clinic Dept: [phone] and follow the prompts.   For any non-urgent questions, you may also contact your provider using MyChart. We now offer e-Visits for anyone 18 and older to request care online for non-urgent symptoms. For details visit [email or web address].   Also download the MyChart app! Go to the app store, search "MyChart", open the app, select [place] [facility], and log in with your MyChart username and password.  Due to Covid, a mask is required upon entering the hospital/clinic. If you do not have a mask, one will be given to you upon arrival. For doctor visits, patients may have 1 support person aged 18 or older with them. For treatment visits, patients cannot have anyone with them due to current Covid guidelines and our immunocompromised population.   

## 2020-11-04 NOTE — Assessment & Plan Note (Signed)
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 Neoadj chemo withTaxotere Herceptin Perjeta x6 cycles completed 08/26/2020 followed by Steward Drone maintenance 2. Mastectomywith sentinel node biopsy: 09/20/2020: Residual invasive ductal carcinoma 7.4 cm invades dermis and skin and skeletal muscle, 4/14 lymph nodes positive, margins negative, lymphovascular invasion present, ER 95%, PR 20%, HER2 positive, Ki-67 25% 3. Adj XRT 4. Adj Anti estrogen therapy -------------------------------------------------------------------------------------------------- Current treatment: Kadcyla maintenance started 10/11/2020 Kadcyla toxicities:  Return to clinic every 3 weeks for Kadcyla and every 6 weeks with follow-up with me.

## 2020-11-04 NOTE — Progress Notes (Signed)
Nutrition  Patient provided complimentary case of Ensure Plus

## 2020-11-09 ENCOUNTER — Ambulatory Visit: Payer: Medicare HMO | Attending: Radiation Oncology | Admitting: Radiation Oncology

## 2020-11-15 ENCOUNTER — Encounter: Payer: Self-pay | Admitting: *Deleted

## 2020-11-16 ENCOUNTER — Ambulatory Visit: Payer: Medicare HMO | Admitting: Radiation Oncology

## 2020-11-17 ENCOUNTER — Ambulatory Visit: Payer: Medicare HMO

## 2020-11-17 ENCOUNTER — Other Ambulatory Visit: Payer: Medicare HMO

## 2020-11-18 ENCOUNTER — Ambulatory Visit: Payer: Medicare HMO

## 2020-11-21 ENCOUNTER — Ambulatory Visit: Payer: Medicare HMO

## 2020-11-22 ENCOUNTER — Ambulatory Visit: Payer: Medicare HMO

## 2020-11-22 ENCOUNTER — Other Ambulatory Visit: Payer: Self-pay | Admitting: *Deleted

## 2020-11-22 DIAGNOSIS — C50911 Malignant neoplasm of unspecified site of right female breast: Secondary | ICD-10-CM

## 2020-11-23 ENCOUNTER — Ambulatory Visit: Payer: Medicare HMO

## 2020-11-24 ENCOUNTER — Other Ambulatory Visit: Payer: Self-pay | Admitting: *Deleted

## 2020-11-24 ENCOUNTER — Inpatient Hospital Stay: Payer: Medicare HMO

## 2020-11-24 ENCOUNTER — Ambulatory Visit: Payer: Medicare HMO | Admitting: Dietician

## 2020-11-24 ENCOUNTER — Other Ambulatory Visit: Payer: Self-pay

## 2020-11-24 ENCOUNTER — Inpatient Hospital Stay: Payer: Medicare HMO | Attending: Hematology and Oncology

## 2020-11-24 ENCOUNTER — Ambulatory Visit: Payer: Medicare HMO

## 2020-11-24 VITALS — BP 166/80 | HR 65 | Temp 97.6°F | Resp 16 | Wt 119.5 lb

## 2020-11-24 DIAGNOSIS — C50911 Malignant neoplasm of unspecified site of right female breast: Secondary | ICD-10-CM | POA: Insufficient documentation

## 2020-11-24 DIAGNOSIS — C773 Secondary and unspecified malignant neoplasm of axilla and upper limb lymph nodes: Secondary | ICD-10-CM | POA: Diagnosis not present

## 2020-11-24 DIAGNOSIS — Z170 Estrogen receptor positive status [ER+]: Secondary | ICD-10-CM | POA: Diagnosis not present

## 2020-11-24 DIAGNOSIS — Z5112 Encounter for antineoplastic immunotherapy: Secondary | ICD-10-CM | POA: Diagnosis not present

## 2020-11-24 DIAGNOSIS — Z5181 Encounter for therapeutic drug level monitoring: Secondary | ICD-10-CM

## 2020-11-24 DIAGNOSIS — Z95828 Presence of other vascular implants and grafts: Secondary | ICD-10-CM

## 2020-11-24 LAB — CBC WITH DIFFERENTIAL (CANCER CENTER ONLY)
Abs Immature Granulocytes: 0.01 10*3/uL (ref 0.00–0.07)
Basophils Absolute: 0 10*3/uL (ref 0.0–0.1)
Basophils Relative: 1 %
Eosinophils Absolute: 0.2 10*3/uL (ref 0.0–0.5)
Eosinophils Relative: 5 %
HCT: 33.3 % — ABNORMAL LOW (ref 36.0–46.0)
Hemoglobin: 10.3 g/dL — ABNORMAL LOW (ref 12.0–15.0)
Immature Granulocytes: 0 %
Lymphocytes Relative: 24 %
Lymphs Abs: 1.1 10*3/uL (ref 0.7–4.0)
MCH: 25.1 pg — ABNORMAL LOW (ref 26.0–34.0)
MCHC: 30.9 g/dL (ref 30.0–36.0)
MCV: 81.2 fL (ref 80.0–100.0)
Monocytes Absolute: 0.4 10*3/uL (ref 0.1–1.0)
Monocytes Relative: 10 %
Neutro Abs: 2.8 10*3/uL (ref 1.7–7.7)
Neutrophils Relative %: 60 %
Platelet Count: 183 10*3/uL (ref 150–400)
RBC: 4.1 MIL/uL (ref 3.87–5.11)
RDW: 15.9 % — ABNORMAL HIGH (ref 11.5–15.5)
WBC Count: 4.6 10*3/uL (ref 4.0–10.5)
nRBC: 0 % (ref 0.0–0.2)

## 2020-11-24 LAB — CMP (CANCER CENTER ONLY)
ALT: 19 U/L (ref 0–44)
AST: 26 U/L (ref 15–41)
Albumin: 3.6 g/dL (ref 3.5–5.0)
Alkaline Phosphatase: 69 U/L (ref 38–126)
Anion gap: 6 (ref 5–15)
BUN: 21 mg/dL (ref 8–23)
CO2: 28 mmol/L (ref 22–32)
Calcium: 8.7 mg/dL — ABNORMAL LOW (ref 8.9–10.3)
Chloride: 105 mmol/L (ref 98–111)
Creatinine: 0.74 mg/dL (ref 0.44–1.00)
GFR, Estimated: >60 mL/min (ref >60)
Glucose, Bld: 96 mg/dL (ref 70–99)
Potassium: 3.9 mmol/L (ref 3.5–5.1)
Sodium: 139 mmol/L (ref 135–145)
Total Bilirubin: 0.3 mg/dL (ref 0.3–1.2)
Total Protein: 7 g/dL (ref 6.5–8.1)

## 2020-11-24 MED ORDER — SODIUM CHLORIDE 0.9 % IV SOLN
3.6000 mg/kg | Freq: Once | INTRAVENOUS | Status: AC
  Administered 2020-11-24: 200 mg via INTRAVENOUS
  Filled 2020-11-24: qty 10

## 2020-11-24 MED ORDER — ACETAMINOPHEN 325 MG PO TABS
ORAL_TABLET | ORAL | Status: AC
  Filled 2020-11-24: qty 2

## 2020-11-24 MED ORDER — DIPHENHYDRAMINE HCL 25 MG PO CAPS
25.0000 mg | ORAL_CAPSULE | Freq: Once | ORAL | Status: AC
  Administered 2020-11-24: 25 mg via ORAL

## 2020-11-24 MED ORDER — SODIUM CHLORIDE 0.9 % IV SOLN: Freq: Once | INTRAVENOUS | Status: AC

## 2020-11-24 MED ORDER — SODIUM CHLORIDE 0.9% FLUSH
10.0000 mL | INTRAVENOUS | Status: DC | PRN
  Administered 2020-11-24: 10 mL

## 2020-11-24 MED ORDER — HEPARIN SOD (PORK) LOCK FLUSH 100 UNIT/ML IV SOLN
500.0000 [IU] | Freq: Once | INTRAVENOUS | Status: AC | PRN
  Administered 2020-11-24: 500 [IU]

## 2020-11-24 MED ORDER — DIPHENHYDRAMINE HCL 25 MG PO CAPS
ORAL_CAPSULE | ORAL | Status: AC
  Filled 2020-11-24: qty 1

## 2020-11-24 MED ORDER — SODIUM CHLORIDE 0.9% FLUSH
10.0000 mL | Freq: Once | INTRAVENOUS | Status: AC
  Administered 2020-11-24: 10 mL

## 2020-11-24 MED ORDER — ACETAMINOPHEN 325 MG PO TABS
650.0000 mg | ORAL_TABLET | Freq: Once | ORAL | Status: AC
  Administered 2020-11-24: 650 mg via ORAL

## 2020-11-24 NOTE — Progress Notes (Signed)
OK to trt w/ echo from 08/04/2020 per MD  OK to proceed w/ treatment today using CMP from 11/04/2020 per MD

## 2020-11-24 NOTE — Patient Instructions (Signed)
[facility]  Discharge Instructions: °Thank you for choosing [facility] to provide your oncology and hematology care.  ° °If you have a lab appointment with the Cancer Center, please go directly to the Cancer Center and check in at the registration area. °  °Wear comfortable clothing and clothing appropriate for easy access to any Portacath or PICC line.  ° °We strive to give you quality time with your provider. You may need to reschedule your appointment if you arrive late (15 or more minutes).  Arriving late affects you and other patients whose appointments are after yours.  Also, if you miss three or more appointments without notifying the office, you may be dismissed from the clinic at the provider’s discretion.    °  °For prescription refill requests, have your pharmacy contact our office and allow 72 hours for refills to be completed.   ° °Today you received the following chemotherapy and/or immunotherapy agents Kadcyla    °  °To help prevent nausea and vomiting after your treatment, we encourage you to take your nausea medication as directed. ° °BELOW ARE SYMPTOMS THAT SHOULD BE REPORTED IMMEDIATELY: °*FEVER GREATER THAN 100.4 F (38 °C) OR HIGHER °*CHILLS OR SWEATING °*NAUSEA AND VOMITING THAT IS NOT CONTROLLED WITH YOUR NAUSEA MEDICATION °*UNUSUAL SHORTNESS OF BREATH °*UNUSUAL BRUISING OR BLEEDING °*URINARY PROBLEMS (pain or burning when urinating, or frequent urination) °*BOWEL PROBLEMS (unusual diarrhea, constipation, pain near the anus) °TENDERNESS IN MOUTH AND THROAT WITH OR WITHOUT PRESENCE OF ULCERS (sore throat, sores in mouth, or a toothache) °UNUSUAL RASH, SWELLING OR PAIN  °UNUSUAL VAGINAL DISCHARGE OR ITCHING  ° °Items with * indicate a potential emergency and should be followed up as soon as possible or go to the Emergency Department if any problems should occur. ° °Please show the CHEMOTHERAPY ALERT CARD or IMMUNOTHERAPY ALERT CARD at check-in to the  Emergency Department and triage nurse. ° °Should you have questions after your visit or need to cancel or reschedule your appointment, please contact [facility]  Dept: [phone]  and follow the prompts.  Office hours are 8:00 a.m. to 4:30 p.m. Monday - Friday. Please note that voicemails left after 4:00 p.m. may not be returned until the following business day.  We are closed weekends and major holidays. You have access to a nurse at all times for urgent questions. Please call the main number to the clinic Dept: [phone] and follow the prompts. ° ° °For any non-urgent questions, you may also contact your provider using MyChart. We now offer e-Visits for anyone 18 and older to request care online for non-urgent symptoms. For details visit [email or web address]. °  °Also download the MyChart app! Go to the app store, search "MyChart", open the app, select , and log in with your MyChart username and password. ° °Due to Covid, a mask is required upon entering the hospital/clinic. If you do not have a mask, one will be given to you upon arrival. For doctor visits, patients may have 1 support person aged 18 or older with them. For treatment visits, patients cannot have anyone with them due to current Covid guidelines and our immunocompromised population.  ° °

## 2020-11-24 NOTE — Progress Notes (Signed)
Nutrition Follow-up:  Patient receiving maintenance Kadcyla for right breast cancer. She completed 6 cycles neoadjuvant chemotherapy with Taxotere Herceptin Perjecta 7/15.   Met with patient during infusion. She reports having a good appetite and eating well. Patient recalls burgers, egg sandwiches, pork chops, hamburger steak, pintos, fried pots/onions, drinking water, whole milk, and juices. Patient continues drinking Ensure daily, usually with breakfast. She is requesting additional complimentary Ensure case today. Patient denies nutrition impact symptoms.   Medications: reviewed  Labs: reviewed  Anthropometrics: Weight 119 lb 8 oz today stable   9/23 - 117 lb 3.2 oz 9/7 - 117 lb 12.8 oz 8/19 - 118 lb   NUTRITION DIAGNOSIS: Unintended weight loss stable   INTERVENTION:  Continue eating high calorie, high protein foods for weight maintenance Encouraged drinking Ensure Plus/equivalent as needed with decreased appetite Complimentary Ensure Enlive case provided today per pt request Patient has contact information    MONITORING, EVALUATION, GOAL: weight trends, intake    NEXT VISIT: To be scheduled as needed with treatment

## 2020-11-25 ENCOUNTER — Ambulatory Visit: Payer: Medicare HMO

## 2020-11-28 ENCOUNTER — Ambulatory Visit: Payer: Medicare HMO

## 2020-11-29 ENCOUNTER — Ambulatory Visit: Payer: Medicare HMO

## 2020-11-29 ENCOUNTER — Other Ambulatory Visit (HOSPITAL_COMMUNITY): Payer: Medicare HMO

## 2020-11-29 ENCOUNTER — Other Ambulatory Visit: Payer: Self-pay | Admitting: Hematology and Oncology

## 2020-11-30 ENCOUNTER — Encounter: Payer: Self-pay | Admitting: Hematology and Oncology

## 2020-11-30 ENCOUNTER — Ambulatory Visit: Payer: Medicare HMO

## 2020-12-01 ENCOUNTER — Ambulatory Visit: Payer: Medicare HMO

## 2020-12-02 ENCOUNTER — Ambulatory Visit: Payer: Medicare HMO

## 2020-12-02 ENCOUNTER — Encounter: Payer: Self-pay | Admitting: *Deleted

## 2020-12-05 ENCOUNTER — Ambulatory Visit: Payer: Medicare HMO

## 2020-12-06 ENCOUNTER — Other Ambulatory Visit: Payer: Self-pay

## 2020-12-06 ENCOUNTER — Ambulatory Visit (HOSPITAL_COMMUNITY)
Admission: RE | Admit: 2020-12-06 | Discharge: 2020-12-06 | Disposition: A | Discharge status: Home / Self Care | Payer: Medicare HMO | Source: Ambulatory Visit | Attending: Hematology and Oncology | Admitting: Hematology and Oncology

## 2020-12-06 ENCOUNTER — Encounter: Payer: Self-pay | Admitting: *Deleted

## 2020-12-06 ENCOUNTER — Ambulatory Visit: Payer: Medicare HMO

## 2020-12-06 DIAGNOSIS — Z0189 Encounter for other specified special examinations: Secondary | ICD-10-CM

## 2020-12-06 DIAGNOSIS — Z5181 Encounter for therapeutic drug level monitoring: Secondary | ICD-10-CM | POA: Insufficient documentation

## 2020-12-06 DIAGNOSIS — Z79899 Other long term (current) drug therapy: Secondary | ICD-10-CM | POA: Insufficient documentation

## 2020-12-06 LAB — ECHOCARDIOGRAM COMPLETE
Area-P 1/2: 3.16 cm2
S' Lateral: 2.5 cm

## 2020-12-06 NOTE — Progress Notes (Signed)
  Echocardiogram 2D Echocardiogram has been performed.  Elizabeth Chase 12/06/2020, 9:56 AM

## 2020-12-07 ENCOUNTER — Ambulatory Visit: Payer: Medicare HMO

## 2020-12-08 ENCOUNTER — Ambulatory Visit: Payer: Medicare HMO

## 2020-12-09 ENCOUNTER — Ambulatory Visit: Payer: Medicare HMO

## 2020-12-09 ENCOUNTER — Other Ambulatory Visit: Payer: Self-pay

## 2020-12-09 ENCOUNTER — Ambulatory Visit
Admission: RE | Admit: 2020-12-09 | Discharge: 2020-12-09 | Disposition: A | Discharge status: Home / Self Care | Payer: Medicare HMO | Source: Ambulatory Visit | Attending: Radiation Oncology | Admitting: Radiation Oncology

## 2020-12-09 DIAGNOSIS — Z170 Estrogen receptor positive status [ER+]: Secondary | ICD-10-CM | POA: Diagnosis not present

## 2020-12-09 DIAGNOSIS — C50911 Malignant neoplasm of unspecified site of right female breast: Secondary | ICD-10-CM | POA: Diagnosis not present

## 2020-12-09 DIAGNOSIS — C50111 Malignant neoplasm of central portion of right female breast: Secondary | ICD-10-CM | POA: Diagnosis not present

## 2020-12-12 ENCOUNTER — Ambulatory Visit: Payer: Medicare HMO

## 2020-12-12 ENCOUNTER — Ambulatory Visit: Payer: Medicare HMO | Admitting: Radiation Oncology

## 2020-12-13 ENCOUNTER — Ambulatory Visit: Payer: Medicare HMO

## 2020-12-14 ENCOUNTER — Ambulatory Visit: Payer: Medicare HMO

## 2020-12-15 ENCOUNTER — Inpatient Hospital Stay (HOSPITAL_BASED_OUTPATIENT_CLINIC_OR_DEPARTMENT_OTHER): Payer: Medicare HMO | Admitting: Adult Health

## 2020-12-15 ENCOUNTER — Inpatient Hospital Stay: Payer: Medicare HMO | Attending: Hematology and Oncology

## 2020-12-15 ENCOUNTER — Encounter: Payer: Self-pay | Admitting: *Deleted

## 2020-12-15 ENCOUNTER — Other Ambulatory Visit: Payer: Self-pay

## 2020-12-15 ENCOUNTER — Ambulatory Visit: Payer: Medicare HMO

## 2020-12-15 ENCOUNTER — Encounter: Payer: Self-pay | Admitting: Adult Health

## 2020-12-15 ENCOUNTER — Inpatient Hospital Stay: Payer: Medicare HMO

## 2020-12-15 VITALS — BP 141/62 | HR 87 | Temp 97.2°F | Resp 18 | Ht 65.0 in | Wt 117.2 lb

## 2020-12-15 DIAGNOSIS — Z5112 Encounter for antineoplastic immunotherapy: Secondary | ICD-10-CM | POA: Insufficient documentation

## 2020-12-15 DIAGNOSIS — C50011 Malignant neoplasm of nipple and areola, right female breast: Secondary | ICD-10-CM | POA: Diagnosis not present

## 2020-12-15 DIAGNOSIS — C773 Secondary and unspecified malignant neoplasm of axilla and upper limb lymph nodes: Secondary | ICD-10-CM | POA: Diagnosis not present

## 2020-12-15 DIAGNOSIS — C50911 Malignant neoplasm of unspecified site of right female breast: Secondary | ICD-10-CM | POA: Diagnosis not present

## 2020-12-15 DIAGNOSIS — Z170 Estrogen receptor positive status [ER+]: Secondary | ICD-10-CM

## 2020-12-15 DIAGNOSIS — Z23 Encounter for immunization: Secondary | ICD-10-CM | POA: Insufficient documentation

## 2020-12-15 DIAGNOSIS — Z95828 Presence of other vascular implants and grafts: Secondary | ICD-10-CM

## 2020-12-15 DIAGNOSIS — J438 Other emphysema: Secondary | ICD-10-CM

## 2020-12-15 LAB — CMP (CANCER CENTER ONLY)
ALT: 10 U/L (ref 0–44)
AST: 17 U/L (ref 15–41)
Albumin: 3.3 g/dL — ABNORMAL LOW (ref 3.5–5.0)
Alkaline Phosphatase: 68 U/L (ref 38–126)
Anion gap: 9 (ref 5–15)
BUN: 13 mg/dL (ref 8–23)
CO2: 27 mmol/L (ref 22–32)
Calcium: 8.6 mg/dL — ABNORMAL LOW (ref 8.9–10.3)
Chloride: 105 mmol/L (ref 98–111)
Creatinine: 0.75 mg/dL (ref 0.44–1.00)
GFR, Estimated: >60 mL/min (ref >60)
Glucose, Bld: 117 mg/dL — ABNORMAL HIGH (ref 70–99)
Potassium: 3.5 mmol/L (ref 3.5–5.1)
Sodium: 141 mmol/L (ref 135–145)
Total Bilirubin: 0.4 mg/dL (ref 0.3–1.2)
Total Protein: 6.8 g/dL (ref 6.5–8.1)

## 2020-12-15 LAB — CBC WITH DIFFERENTIAL (CANCER CENTER ONLY)
Abs Immature Granulocytes: 0.01 10*3/uL (ref 0.00–0.07)
Basophils Absolute: 0 10*3/uL (ref 0.0–0.1)
Basophils Relative: 1 %
Eosinophils Absolute: 0.1 10*3/uL (ref 0.0–0.5)
Eosinophils Relative: 3 %
HCT: 34.5 % — ABNORMAL LOW (ref 36.0–46.0)
Hemoglobin: 10.7 g/dL — ABNORMAL LOW (ref 12.0–15.0)
Immature Granulocytes: 0 %
Lymphocytes Relative: 22 %
Lymphs Abs: 0.9 10*3/uL (ref 0.7–4.0)
MCH: 25.1 pg — ABNORMAL LOW (ref 26.0–34.0)
MCHC: 31 g/dL (ref 30.0–36.0)
MCV: 81 fL (ref 80.0–100.0)
Monocytes Absolute: 0.5 10*3/uL (ref 0.1–1.0)
Monocytes Relative: 10 %
Neutro Abs: 2.8 10*3/uL (ref 1.7–7.7)
Neutrophils Relative %: 64 %
Platelet Count: 189 10*3/uL (ref 150–400)
RBC: 4.26 MIL/uL (ref 3.87–5.11)
RDW: 16.3 % — ABNORMAL HIGH (ref 11.5–15.5)
WBC Count: 4.3 10*3/uL (ref 4.0–10.5)
nRBC: 0 % (ref 0.0–0.2)

## 2020-12-15 MED ORDER — ACETAMINOPHEN 325 MG PO TABS
650.0000 mg | ORAL_TABLET | Freq: Once | ORAL | Status: AC
  Administered 2020-12-15: 650 mg via ORAL
  Filled 2020-12-15: qty 2

## 2020-12-15 MED ORDER — SODIUM CHLORIDE 0.9 % IV SOLN
3.6000 mg/kg | Freq: Once | INTRAVENOUS | Status: AC
  Administered 2020-12-15: 200 mg via INTRAVENOUS
  Filled 2020-12-15: qty 10

## 2020-12-15 MED ORDER — ALBUTEROL SULFATE HFA 108 (90 BASE) MCG/ACT IN AERS: INHALATION_SPRAY | RESPIRATORY_TRACT | 2 refills | Status: DC

## 2020-12-15 MED ORDER — SODIUM CHLORIDE 0.9% FLUSH
10.0000 mL | INTRAVENOUS | Status: DC | PRN
  Administered 2020-12-15: 10 mL

## 2020-12-15 MED ORDER — SODIUM CHLORIDE 0.9 % IV SOLN: Freq: Once | INTRAVENOUS | Status: AC

## 2020-12-15 MED ORDER — SODIUM CHLORIDE 0.9% FLUSH
10.0000 mL | Freq: Once | INTRAVENOUS | Status: AC
  Administered 2020-12-15: 10 mL

## 2020-12-15 MED ORDER — HEPARIN SOD (PORK) LOCK FLUSH 100 UNIT/ML IV SOLN
500.0000 [IU] | Freq: Once | INTRAVENOUS | Status: AC | PRN
  Administered 2020-12-15: 500 [IU]

## 2020-12-15 MED ORDER — INFLUENZA VAC A&B SA ADJ QUAD 0.5 ML IM PRSY
0.5000 mL | PREFILLED_SYRINGE | Freq: Once | INTRAMUSCULAR | Status: AC
  Administered 2020-12-15: 0.5 mL via INTRAMUSCULAR
  Filled 2020-12-15: qty 0.5

## 2020-12-15 MED ORDER — DIPHENHYDRAMINE HCL 25 MG PO CAPS
25.0000 mg | ORAL_CAPSULE | Freq: Once | ORAL | Status: AC
  Administered 2020-12-15: 25 mg via ORAL
  Filled 2020-12-15: qty 1

## 2020-12-15 NOTE — Patient Instructions (Addendum)
[facility] ONCOLOG[facility]  Discharge Instructions: Thank you for choosing [facility] to provide your oncology and hematology care.   If you have a lab appointment with the [facility], please go directly to the [facility] and check in at the registration area.   Wear comfortable clothing and clothing appropriate for easy access to any Portacath or PICC line.   We strive to give you quality time with your provider. You may need to reschedule your appointment if you arrive late (15 or more minutes).  Arriving late affects you and other patients whose appointments are after yours.  Also, if you miss three or more appointments without notifying the office, you may be dismissed from the clinic at the provider's discretion.      For prescription refill requests, have your pharmacy contact our office and allow 72 hours for refills to be completed.    Today you received the following chemotherapy and/or immunotherapy agents Kadcyla      To help prevent nausea and vomiting after your treatment, we encourage you to take your nausea medication as directed.  BELOW ARE SYMPTOMS THAT SHOULD BE REPORTED IMMEDIATELY: *FEVER GREATER THAN 100.4 F (38 C) OR HIGHER *CHILLS OR SWEATING *NAUSEA AND VOMITING THAT IS NOT CONTROLLED WITH YOUR NAUSEA MEDICATION *UNUSUAL SHORTNESS OF BREATH *UNUSUAL BRUISING OR BLEEDING *URINARY PROBLEMS (pain or burning when urinating, or frequent urination) *BOWEL PROBLEMS (unusual diarrhea, constipation, pain near the anus) TENDERNESS IN MOUTH AND THROAT WITH OR WITHOUT PRESENCE OF ULCERS (sore throat, sores in mouth, or a toothache) UNUSUAL RASH, SWELLING OR PAIN  UNUSUAL VAGINAL DISCHARGE OR ITCHING   Items with * indicate a potential emergency and should be followed up as soon as possible or go to the Emergency Department if any problems should occur.  Please show the CHEMOTHERAPY ALERT CARD or IMMUNOTHERAPY ALERT CARD at check-in to the  Emergency Department and triage nurse.  Should you have questions after your visit or need to cancel or reschedule your appointment, please contact [facility]  Dept: [phone]  and follow the prompts.  Office hours are 8:00 a.m. to 4:30 p.m. Monday - Friday. Please note that voicemails left after 4:00 p.m. may not be returned until the following business day.  We are closed weekends and major holidays. You have access to a nurse at all times for urgent questions. Please call the main number to the clinic Dept: [phone] and follow the prompts.   For any non-urgent questions, you may also contact your provider using MyChart. We now offer e-Visits for anyone 48 and older to request care online for non-urgent symptoms. For details visit [email or web address].   Also download the MyChart app! Go to the app store, search "MyChart", open the app, select [facility], and log in with your MyChart username and password.  Due to Covid, a mask is required upon entering the hospital/clinic. If you do not have a mask, one will be given to you upon arrival. For doctor visits, patients may have 1 support person aged 80 or older with them. For treatment visits, patients cannot have anyone with them due to current Covid guidelines and our immunocompromised population.  Influenza (Flu) Vaccine (Inactivated or Recombinant): What You Need to Know 1. Why get vaccinated? Influenza vaccine can prevent influenza (flu). Flu is a contagious disease that spreads around the [place] every year, usually between October and May. Anyone can get the flu, but it is more dangerous for  some people. Infants and young children, people 68 years and older, pregnant people, and people with certain health conditions or a weakened immune system are at greatest risk of flu complications. Pneumonia, bronchitis, sinus infections, and ear infections are examples of flu-related complications. If you have  a medical condition, such as heart disease, cancer, or diabetes, flu can make it worse. Flu can cause fever and chills, sore throat, muscle aches, fatigue, cough, headache, and runny or stuffy nose. Some people may have vomiting and diarrhea, though this is more common in children than adults. In an average year, thousands of people in the [place] die from flu, and many more are hospitalized. Flu vaccine prevents millions of illnesses and flu-related visits to the doctor each year. 2. Influenza vaccines CDC recommends everyone 6 months and older get vaccinated every flu season. Children 6 months through 42 years of age may need 2 doses during a single flu season. Everyone else needs only 1 dose each flu season. It takes about 2 weeks for protection to develop after vaccination. There are many flu viruses, and they are always changing. Each year a new flu vaccine is made to protect against the influenza viruses believed to be likely to cause disease in the upcoming flu season. Even when the vaccine doesn't exactly match these viruses, it may still provide some protection. Influenza vaccine does not cause flu. Influenza vaccine may be given at the same time as other vaccines. 3. Talk with your health care provider Tell your vaccination provider if the person getting the vaccine: Has had an allergic reaction after a previous dose of influenza vaccine, or has any severe, life-threatening allergies Has ever had Guillain-Barr Syndrome (also called "GBS") In some cases, your health care provider may decide to postpone influenza vaccination until a future visit. Influenza vaccine can be administered at any time during pregnancy. People who are or will be pregnant during influenza season should receive inactivated influenza vaccine. People with minor illnesses, such as a cold, may be vaccinated. People who are moderately or severely ill should usually wait until they recover before getting influenza  vaccine. Your health care provider can give you more information. 4. Risks of a vaccine reaction Soreness, redness, and swelling where the shot is given, fever, muscle aches, and headache can happen after influenza vaccination. There may be a very small increased risk of Guillain-Barr Syndrome (GBS) after inactivated influenza vaccine (the flu shot). Young children who get the flu shot along with pneumococcal vaccine (PCV13) and/or DTaP vaccine at the same time might be slightly more likely to have a seizure caused by fever. Tell your health care provider if a child who is getting flu vaccine has ever had a seizure. People sometimes faint after medical procedures, including vaccination. Tell your provider if you feel dizzy or have vision changes or ringing in the ears. As with any medicine, there is a very remote chance of a vaccine causing a severe allergic reaction, other serious injury, or death. 5. What if there is a serious problem? An allergic reaction could occur after the vaccinated person leaves the clinic. If you see signs of a severe allergic reaction (hives, swelling of the face and throat, difficulty breathing, a fast heartbeat, dizziness, or weakness), call 9-1-1 and get the person to the nearest hospital. For other signs that concern you, call your health care provider. Adverse reactions should be reported to the Vaccine Adverse Event Reporting System (VAERS). Your health care provider will usually file this report,  or you can do it yourself. Visit the VAERS website at www.vaers.SamedayNews.es or call 636-059-5230. VAERS is only for reporting reactions, and VAERS staff members do not give medical advice. 6. The National Vaccine Injury Compensation Program The [employer] Vaccine Injury Compensation Program (VICP) is a federal program that was created to compensate people who may have been injured by certain vaccines. Claims regarding alleged injury or death due to vaccination have a time limit  for filing, which may be as short as two years. Visit the VICP website at GoldCloset.com.ee or call 502-279-7524 to learn about the program and about filing a claim. 7. How can I learn more? Ask your health care provider. Call your local or state health department. Visit the website of the Food and Drug Administration (FDA) for vaccine package inserts and additional information at TraderRating.uy. Contact the Centers for Disease Control and Prevention (CDC): Call 872-793-9931 (1-800-CDC-INFO) or Visit CDC's website at https://gibson.com/. Vaccine Information Statement Inactivated Influenza Vaccine (09/18/2019) This information is not intended to replace advice given to you by your health care provider. Make sure you discuss any questions you have with your health care provider. Document Revised: 11/05/2019 Document Reviewed: 11/05/2019 Elsevier Patient Education  2022 [employer].

## 2020-12-15 NOTE — Progress Notes (Signed)
[facility] Follow up:    Pcp, No No address on file   DIAGNOSIS: Cancer Staging Malignant neoplasm of right breast in female, estrogen receptor positive ([place]) Staging form: Breast, AJCC 8th Edition - Clinical stage from 04/18/2020: Stage IIIB (cT4b, cN1, cM0, G3, ER+, PR+, HER2+) - Signed by Gardenia Phlegm, NP on 04/27/2020 Stage prefix: Initial diagnosis Histologic grading system: 3 grade system   SUMMARY OF ONCOLOGIC HISTORY: Oncology History  Malignant neoplasm of right breast in female, estrogen receptor positive ([place])  04/18/2020 Initial Diagnosis   Patient presented to the ED with complaint of syncope and found to have a fungating right breast mass. CT showed a right breast mass, 8.1cm, and right axillary adenopathy. Patient reported a right breast mass present for past 2-3 years. Biopsy showed IDC, grade 3, HER-2 equivocal by IHC, positive by FISH (ratio 3.56), ER+ 95%, PR+ 70%, ,Ki67 25%.    04/18/2020 Cancer Staging   Staging form: Breast, AJCC 8th Edition - Clinical stage from 04/18/2020: Stage IIIB (cT4b, cN1, cM0, G3, ER+, PR+, HER2+) - Signed by Gardenia Phlegm, NP on 04/27/2020 Stage prefix: Initial diagnosis Histologic grading system: 3 grade system    05/13/2020 - 08/29/2020 Chemotherapy          10/11/2020 -  Chemotherapy   Patient is on Treatment Plan : BREAST ADO-Trastuzumab Emtansine (Kadcyla) q21d       CURRENT THERAPY: Kadcyla; adjuvant radiation therapy  INTERVAL HISTORY: Elizabeth Chase 80 y.o. female returns for evaluation prior to receiving adjuvant Kadcyla.  Her most recent echocardiogram was completed on December 06, 2020.  It showed a left ventricular ejection fraction of 55 to 60%.  Elizabeth Chase notes that she is doing well today.  She occasionally feels chilly.  And sometimes she has very mild clearing her throat sensation after eating.  She denies any cough or shortness of breath.  She does have a history of COPD requiring  asthma inhaler and is requesting a refill today.  They are also requesting permanent disability parking placard.  Elizabeth Chase remains active.  She is tolerating her treatment well and has no other concerns today.  She will begin radiation on Monday.   Patient Active Problem List   Diagnosis Date Noted   Cancer of central portion of right breast ([place]) 10/19/2020   Breast cancer, stage 3, right ([place]) 09/20/2020   COPD (chronic obstructive pulmonary disease) ([place]) 08/05/2020   Port-A-Cath in place 05/13/2020   Breast wound 05/13/2020   Homelessness 05/13/2020   Malignant neoplasm of right breast in female, estrogen receptor positive ([place])    Syncope and collapse 04/15/2020   Breast mass, right 04/15/2020   Prolonged QT interval 04/15/2020   AKI (acute kidney injury) ([place]) 04/15/2020   Colitis 04/15/2020   COVID-19 virus infection 04/15/2020   Hyperlipidemia    Abnormal brain MRI    Meningioma ([place])    Right thalamic infarction ([place]) 01/01/2017   Essential hypertension     is allergic to bee venom.  MEDICAL HISTORY: Past Medical History:  Diagnosis Date   Abnormal brain MRI    Abnormal glucose level    AKI (acute kidney injury) ([place]) 04/15/2020   Anemia    Blood pressure abnormally low    Breast wound 05/13/2020   Colitis 04/15/2020   COVID-19 virus infection 04/15/2020   Essential hypertension    Homelessness 05/13/2020   Hyperlipidemia    Hypertension    Hypocalcemia    Malignant neoplasm of right breast in  female, estrogen receptor positive ([place])    Muscle weakness    Neoplasm of breast, female, malignant ([place])    Port-A-Cath in place 05/13/2020   Prolonged QT interval 04/15/2020   Right thalamic infarction ([place]) 01/01/2017   Stroke ([place])    Syncope and collapse 59/45/8592   Systolic murmur     SURGICAL HISTORY: Past Surgical History:  Procedure Laterality Date   IR IMAGING GUIDED PORT INSERTION  04/26/2020   IR US GUIDE VASC ACCESS RIGHT  04/26/2020   MODIFIED  MASTECTOMY Right 09/20/2020   Procedure: RIGHT MODIFIED RADICAL MASTECTOMY;  Surgeon: Erroll Luna, MD;  Location: [facility];  Service: General;  Laterality: Right;    SOCIAL HISTORY: Social History   Socioeconomic History   Marital status: Widowed    Spouse name: Not on file   Number of children: Not on file   Years of education: Not on file   Highest education level: Not on file  Occupational History   Not on file  Tobacco Use   Smoking status: Former    Packs/day: 1.00    Types: Cigarettes    Quit date: 12/31/2016    Years since quitting: 3.9   Smokeless tobacco: Never  Vaping Use   Vaping Use: Not on file  Substance and Sexual Activity   Alcohol use: No   Drug use: No   Sexual activity: Not on file  Other Topics Concern   Not on file  Social History Narrative   Not on file   Social Determinants of Health   Financial Resource Strain: High Risk   Difficulty of Paying Living Expenses: Very hard  Food Insecurity: Food Insecurity Present   Worried About Charity fundraiser in the Last Year: Often true   Arboriculturist in the Last Year: Often true  Transportation Needs: Unmet Transportation Needs   Lack of Transportation (Medical): Yes   Lack of Transportation (Non-Medical): No  Physical Activity: Not on file  Stress: Not on file  Social Connections: Not on file  Intimate Partner Violence: Not on file    FAMILY HISTORY: Family History  Problem Relation Age of Onset   Diabetes Mother    Cancer Father    Lung cancer Father    Breast cancer Sister    Diabetes Sister    Lung cancer Son    Cancer Brother    Diabetes Brother     Review of Systems  Constitutional:  Positive for fatigue. Negative for appetite change, chills, fever and unexpected weight change.  HENT:   Negative for hearing loss, lump/mass and trouble swallowing.   Eyes:  Negative for eye problems and icterus.  Respiratory:  Negative for chest tightness, cough and shortness of breath.    Cardiovascular:  Negative for chest pain, leg swelling and palpitations.  Gastrointestinal:  Negative for abdominal distention, abdominal pain, constipation, diarrhea, nausea and vomiting.  Endocrine: Negative for hot flashes.  Genitourinary:  Negative for difficulty urinating.   Musculoskeletal:  Negative for arthralgias.  Skin:  Negative for itching and rash.  Neurological:  Negative for dizziness, extremity weakness, headaches and numbness.  Hematological:  Negative for adenopathy. Does not bruise/bleed easily.  Psychiatric/Behavioral:  Negative for depression. The patient is not nervous/anxious.      PHYSICAL EXAMINATION  ECOG PERFORMANCE STATUS: 1 - Symptomatic but completely ambulatory  Vitals:   12/15/20 0948  BP: (!) 141/62  Pulse: 87  Resp: 18  Temp: (!) 97.2 F (36.2 C)  SpO2: 93%  Physical Exam Constitutional:      General: She is not in acute distress.    Appearance: Normal appearance. She is not toxic-appearing.  HENT:     Head: Normocephalic and atraumatic.  Eyes:     General: No scleral icterus. Cardiovascular:     Rate and Rhythm: Normal rate and regular rhythm.     Pulses: Normal pulses.     Heart sounds: Normal heart sounds.  Pulmonary:     Effort: Pulmonary effort is normal.     Breath sounds: Normal breath sounds.  Abdominal:     General: Abdomen is flat. Bowel sounds are normal. There is no distension.     Palpations: Abdomen is soft.     Tenderness: There is no abdominal tenderness.  Musculoskeletal:        General: No swelling.     Cervical back: Neck supple.  Lymphadenopathy:     Cervical: No cervical adenopathy.  Skin:    General: Skin is warm and dry.     Findings: No rash.  Neurological:     General: No focal deficit present.     Mental Status: She is alert.  Psychiatric:        Mood and Affect: Mood normal.        Behavior: Behavior normal.    LABORATORY DATA:  CBC    Component Value Date/Time   WBC 4.3 12/15/2020 0935    WBC 5.2 09/30/2020 1154   RBC 4.26 12/15/2020 0935   HGB 10.7 (L) 12/15/2020 0935   HCT 34.5 (L) 12/15/2020 0935   PLT 189 12/15/2020 0935   MCV 81.0 12/15/2020 0935   MCH 25.1 (L) 12/15/2020 0935   MCHC 31.0 12/15/2020 0935   RDW 16.3 (H) 12/15/2020 0935   LYMPHSABS 0.9 12/15/2020 0935   MONOABS 0.5 12/15/2020 0935   EOSABS 0.1 12/15/2020 0935   BASOSABS 0.0 12/15/2020 0935    CMP     Component Value Date/Time   NA 139 11/24/2020 0951   K 3.9 11/24/2020 0951   CL 105 11/24/2020 0951   CO2 28 11/24/2020 0951   GLUCOSE 96 11/24/2020 0951   BUN 21 11/24/2020 0951   CREATININE 0.74 11/24/2020 0951   CALCIUM 8.7 (L) 11/24/2020 0951   PROT 7.0 11/24/2020 0951   ALBUMIN 3.6 11/24/2020 0951   AST 26 11/24/2020 0951   ALT 19 11/24/2020 0951   ALKPHOS 69 11/24/2020 0951   BILITOT 0.3 11/24/2020 0951   GFRNONAA >60 11/24/2020 0951   GFRAA >60 01/02/2017 0228       ASSESSMENT and THERAPY PLAN:   Malignant neoplasm of right breast in female, estrogen receptor positive ([place]) Stage IIIc fungating tumor of the right breast: ER/PR and HER-2 positive 04/15/20: CT CAP: No distant mets   Treatment Plan: 1. Neoadj chemo with Taxotere Herceptin Perjeta x6 cycles completed 08/26/2020 followed by [employer] maintenance 2. 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 Adj XRT 4. Adj Anti estrogen therapy -------------------------------------------------------------------------------------------------- Current treatment: Kadcyla maintenance started 10/11/2020 Kadcyla toxicities: Elizabeth Chase is tolerating her treatment well.  Her labs are pending at the time of the encounter completion.  She will proceed with her treatment today so long as her labs are within parameters.  Her echo is  up-to-date.  She will follow-up with her PCP regarding her COPD and asthma.  I  did refill her inhaler today.  I requested scheduling of additional appointments.  And also completed a disability parking placard for them.  Return to clinic every 3 weeks for Kadcyla and every 6 weeks with follow-up with me.    All questions were answered. The patient knows to call the clinic with any problems, questions or concerns. We can certainly see the patient much sooner if necessary. This note was electronically signed.  Total encounter time: 25 minutes in face-to-face visit time, chart review, lab review, order entry, form completion, care coordination, and documentation of the encounter.  Wilber Bihari, NP 12/15/20 10:11 AM Medical Oncology and Hematology [facility] [place], [place] [address] Tel. [phone]    Fax. [phone]  *Total Encounter Time as defined by the Centers for Medicare and Medicaid Services includes, in addition to the face-to-face time of a patient visit (documented in the note above) non-face-to-face time: obtaining and reviewing outside history, ordering and reviewing medications, tests or procedures, care coordination (communications with other health care professionals or caregivers) and documentation in the medical record.

## 2020-12-15 NOTE — Assessment & Plan Note (Signed)
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 Neoadj chemo withTaxotere Herceptin Perjeta x6 cycles completed 08/26/2020 followed by [employer]one maintenance 2. Mastectomywith sentinel node biopsy: 09/20/2020: Residual invasive ductal carcinoma 7.4 cm invades dermis and skin and skeletal muscle, 4/14 lymph nodes positive, margins negative, lymphovascular invasion present, ER 95%, PR 20%, HER2 positive, Ki-67 25% 3. Adj XRT 4. Adj Anti estrogen therapy -------------------------------------------------------------------------------------------------- Current treatment: Kadcyla maintenance started 10/11/2020 Kadcyla toxicities: Elizabeth Chase is tolerating her treatment well.  Her labs are pending at the time of the encounter completion.  She will proceed with her treatment today so long as her labs are within parameters.  Her echo is up-to-date.  She will follow-up with her PCP regarding her COPD and asthma.  I did refill her inhaler today.  I requested scheduling of additional appointments.  And also completed a disability parking placard for them.  Return to clinic every 3 weeks for Kadcyla and every 6 weeks with follow-up with me.

## 2020-12-16 ENCOUNTER — Ambulatory Visit: Payer: Medicare HMO

## 2020-12-19 ENCOUNTER — Ambulatory Visit: Payer: Medicare HMO | Admitting: Radiation Oncology

## 2020-12-19 ENCOUNTER — Ambulatory Visit: Payer: Medicare HMO

## 2020-12-19 ENCOUNTER — Ambulatory Visit
Admission: RE | Admit: 2020-12-19 | Discharge: 2020-12-19 | Disposition: A | Discharge status: Home / Self Care | Payer: Medicare HMO | Source: Ambulatory Visit | Attending: Radiation Oncology | Admitting: Radiation Oncology

## 2020-12-19 DIAGNOSIS — C50111 Malignant neoplasm of central portion of right female breast: Secondary | ICD-10-CM | POA: Insufficient documentation

## 2020-12-19 DIAGNOSIS — C50011 Malignant neoplasm of nipple and areola, right female breast: Secondary | ICD-10-CM | POA: Insufficient documentation

## 2020-12-19 DIAGNOSIS — Z170 Estrogen receptor positive status [ER+]: Secondary | ICD-10-CM | POA: Insufficient documentation

## 2020-12-20 ENCOUNTER — Ambulatory Visit: Payer: Medicare HMO

## 2020-12-20 DIAGNOSIS — C50111 Malignant neoplasm of central portion of right female breast: Secondary | ICD-10-CM | POA: Diagnosis not present

## 2020-12-20 DIAGNOSIS — C50011 Malignant neoplasm of nipple and areola, right female breast: Secondary | ICD-10-CM | POA: Diagnosis not present

## 2020-12-20 DIAGNOSIS — Z170 Estrogen receptor positive status [ER+]: Secondary | ICD-10-CM | POA: Diagnosis not present

## 2020-12-21 ENCOUNTER — Ambulatory Visit: Payer: Medicare HMO

## 2020-12-21 ENCOUNTER — Ambulatory Visit: Payer: Medicare HMO | Admitting: Radiation Oncology

## 2020-12-22 ENCOUNTER — Ambulatory Visit: Payer: Medicare HMO

## 2020-12-23 ENCOUNTER — Ambulatory Visit: Payer: Medicare HMO

## 2020-12-26 ENCOUNTER — Ambulatory Visit: Payer: Medicare HMO | Admitting: Radiation Oncology

## 2020-12-26 ENCOUNTER — Ambulatory Visit: Payer: Medicare HMO

## 2020-12-27 ENCOUNTER — Ambulatory Visit: Payer: Medicare HMO

## 2020-12-27 ENCOUNTER — Other Ambulatory Visit: Payer: Self-pay

## 2020-12-27 ENCOUNTER — Ambulatory Visit
Admission: RE | Admit: 2020-12-27 | Discharge: 2020-12-27 | Disposition: A | Discharge status: Home / Self Care | Payer: Medicare HMO | Source: Ambulatory Visit | Attending: Radiation Oncology | Admitting: Radiation Oncology

## 2020-12-27 DIAGNOSIS — Z170 Estrogen receptor positive status [ER+]: Secondary | ICD-10-CM | POA: Diagnosis not present

## 2020-12-27 DIAGNOSIS — C50011 Malignant neoplasm of nipple and areola, right female breast: Secondary | ICD-10-CM | POA: Diagnosis not present

## 2020-12-27 DIAGNOSIS — C50111 Malignant neoplasm of central portion of right female breast: Secondary | ICD-10-CM

## 2020-12-27 MED ORDER — RADIAPLEXRX EX GEL: Freq: Once | CUTANEOUS | Status: AC

## 2020-12-27 MED ORDER — ALRA NON-METALLIC DEODORANT (RAD-ONC)
1.0000 "application " | Freq: Once | TOPICAL | Status: AC
  Administered 2020-12-27: 1 via TOPICAL

## 2020-12-27 NOTE — Progress Notes (Signed)

## 2020-12-28 ENCOUNTER — Ambulatory Visit
Admission: RE | Admit: 2020-12-28 | Discharge: 2020-12-28 | Disposition: A | Discharge status: Home / Self Care | Payer: Medicare HMO | Source: Ambulatory Visit | Attending: Radiation Oncology | Admitting: Radiation Oncology

## 2020-12-28 DIAGNOSIS — C50011 Malignant neoplasm of nipple and areola, right female breast: Secondary | ICD-10-CM | POA: Diagnosis not present

## 2020-12-28 DIAGNOSIS — Z170 Estrogen receptor positive status [ER+]: Secondary | ICD-10-CM | POA: Diagnosis not present

## 2020-12-28 DIAGNOSIS — C50111 Malignant neoplasm of central portion of right female breast: Secondary | ICD-10-CM | POA: Diagnosis not present

## 2020-12-29 ENCOUNTER — Other Ambulatory Visit: Payer: Self-pay

## 2020-12-29 ENCOUNTER — Ambulatory Visit
Admission: RE | Admit: 2020-12-29 | Discharge: 2020-12-29 | Disposition: A | Discharge status: Home / Self Care | Payer: Medicare HMO | Source: Ambulatory Visit | Attending: Radiation Oncology | Admitting: Radiation Oncology

## 2020-12-29 DIAGNOSIS — Z170 Estrogen receptor positive status [ER+]: Secondary | ICD-10-CM | POA: Diagnosis not present

## 2020-12-29 DIAGNOSIS — C50011 Malignant neoplasm of nipple and areola, right female breast: Secondary | ICD-10-CM | POA: Diagnosis not present

## 2020-12-29 DIAGNOSIS — C50111 Malignant neoplasm of central portion of right female breast: Secondary | ICD-10-CM | POA: Diagnosis not present

## 2020-12-30 ENCOUNTER — Ambulatory Visit
Admission: RE | Admit: 2020-12-30 | Discharge: 2020-12-30 | Disposition: A | Discharge status: Home / Self Care | Payer: Medicare HMO | Source: Ambulatory Visit | Attending: Radiation Oncology | Admitting: Radiation Oncology

## 2020-12-30 DIAGNOSIS — C50011 Malignant neoplasm of nipple and areola, right female breast: Secondary | ICD-10-CM | POA: Diagnosis not present

## 2020-12-30 DIAGNOSIS — C50111 Malignant neoplasm of central portion of right female breast: Secondary | ICD-10-CM | POA: Diagnosis not present

## 2020-12-30 DIAGNOSIS — Z170 Estrogen receptor positive status [ER+]: Secondary | ICD-10-CM | POA: Diagnosis not present

## 2021-01-02 ENCOUNTER — Other Ambulatory Visit: Payer: Self-pay

## 2021-01-02 ENCOUNTER — Ambulatory Visit
Admission: RE | Admit: 2021-01-02 | Discharge: 2021-01-02 | Disposition: A | Discharge status: Home / Self Care | Payer: Medicare HMO | Source: Ambulatory Visit | Attending: Radiation Oncology | Admitting: Radiation Oncology

## 2021-01-02 DIAGNOSIS — C50011 Malignant neoplasm of nipple and areola, right female breast: Secondary | ICD-10-CM | POA: Diagnosis not present

## 2021-01-02 DIAGNOSIS — C50111 Malignant neoplasm of central portion of right female breast: Secondary | ICD-10-CM | POA: Diagnosis not present

## 2021-01-02 DIAGNOSIS — Z170 Estrogen receptor positive status [ER+]: Secondary | ICD-10-CM | POA: Diagnosis not present

## 2021-01-03 ENCOUNTER — Ambulatory Visit
Admission: RE | Admit: 2021-01-03 | Discharge: 2021-01-03 | Disposition: A | Discharge status: Home / Self Care | Payer: Medicare HMO | Source: Ambulatory Visit | Attending: Radiation Oncology | Admitting: Radiation Oncology

## 2021-01-03 DIAGNOSIS — C50111 Malignant neoplasm of central portion of right female breast: Secondary | ICD-10-CM | POA: Diagnosis not present

## 2021-01-03 DIAGNOSIS — Z170 Estrogen receptor positive status [ER+]: Secondary | ICD-10-CM | POA: Diagnosis not present

## 2021-01-03 DIAGNOSIS — C50011 Malignant neoplasm of nipple and areola, right female breast: Secondary | ICD-10-CM | POA: Diagnosis not present

## 2021-01-04 ENCOUNTER — Ambulatory Visit
Admission: RE | Admit: 2021-01-04 | Discharge: 2021-01-04 | Disposition: A | Discharge status: Home / Self Care | Payer: Medicare HMO | Source: Ambulatory Visit | Attending: Radiation Oncology | Admitting: Radiation Oncology

## 2021-01-04 ENCOUNTER — Other Ambulatory Visit: Payer: Self-pay

## 2021-01-04 DIAGNOSIS — C50011 Malignant neoplasm of nipple and areola, right female breast: Secondary | ICD-10-CM | POA: Diagnosis not present

## 2021-01-04 DIAGNOSIS — C50111 Malignant neoplasm of central portion of right female breast: Secondary | ICD-10-CM | POA: Diagnosis not present

## 2021-01-04 DIAGNOSIS — Z170 Estrogen receptor positive status [ER+]: Secondary | ICD-10-CM | POA: Diagnosis not present

## 2021-01-06 ENCOUNTER — Inpatient Hospital Stay: Payer: Medicare HMO

## 2021-01-06 ENCOUNTER — Other Ambulatory Visit: Payer: Self-pay

## 2021-01-06 VITALS — BP 151/68 | HR 73 | Temp 98.3°F | Resp 16 | Wt 117.0 lb

## 2021-01-06 DIAGNOSIS — Z95828 Presence of other vascular implants and grafts: Secondary | ICD-10-CM

## 2021-01-06 DIAGNOSIS — C50911 Malignant neoplasm of unspecified site of right female breast: Secondary | ICD-10-CM

## 2021-01-06 DIAGNOSIS — Z170 Estrogen receptor positive status [ER+]: Secondary | ICD-10-CM | POA: Diagnosis not present

## 2021-01-06 DIAGNOSIS — Z5112 Encounter for antineoplastic immunotherapy: Secondary | ICD-10-CM | POA: Diagnosis not present

## 2021-01-06 DIAGNOSIS — Z23 Encounter for immunization: Secondary | ICD-10-CM | POA: Diagnosis not present

## 2021-01-06 DIAGNOSIS — C773 Secondary and unspecified malignant neoplasm of axilla and upper limb lymph nodes: Secondary | ICD-10-CM | POA: Diagnosis not present

## 2021-01-06 LAB — CBC WITH DIFFERENTIAL (CANCER CENTER ONLY)
Abs Immature Granulocytes: 0.01 10*3/uL (ref 0.00–0.07)
Basophils Absolute: 0 10*3/uL (ref 0.0–0.1)
Basophils Relative: 1 %
Eosinophils Absolute: 0.1 10*3/uL (ref 0.0–0.5)
Eosinophils Relative: 3 %
HCT: 35.9 % — ABNORMAL LOW (ref 36.0–46.0)
Hemoglobin: 11.3 g/dL — ABNORMAL LOW (ref 12.0–15.0)
Immature Granulocytes: 0 %
Lymphocytes Relative: 22 %
Lymphs Abs: 0.9 10*3/uL (ref 0.7–4.0)
MCH: 25.5 pg — ABNORMAL LOW (ref 26.0–34.0)
MCHC: 31.5 g/dL (ref 30.0–36.0)
MCV: 81 fL (ref 80.0–100.0)
Monocytes Absolute: 0.4 10*3/uL (ref 0.1–1.0)
Monocytes Relative: 10 %
Neutro Abs: 2.6 10*3/uL (ref 1.7–7.7)
Neutrophils Relative %: 64 %
Platelet Count: 139 10*3/uL — ABNORMAL LOW (ref 150–400)
RBC: 4.43 MIL/uL (ref 3.87–5.11)
RDW: 17.8 % — ABNORMAL HIGH (ref 11.5–15.5)
WBC Count: 4 10*3/uL (ref 4.0–10.5)
nRBC: 0 % (ref 0.0–0.2)

## 2021-01-06 LAB — CMP (CANCER CENTER ONLY)
ALT: 11 U/L (ref 0–44)
AST: 18 U/L (ref 15–41)
Albumin: 3.4 g/dL — ABNORMAL LOW (ref 3.5–5.0)
Alkaline Phosphatase: 62 U/L (ref 38–126)
Anion gap: 8 (ref 5–15)
BUN: 17 mg/dL (ref 8–23)
CO2: 26 mmol/L (ref 22–32)
Calcium: 8.8 mg/dL — ABNORMAL LOW (ref 8.9–10.3)
Chloride: 107 mmol/L (ref 98–111)
Creatinine: 0.79 mg/dL (ref 0.44–1.00)
GFR, Estimated: >60 mL/min (ref >60)
Glucose, Bld: 98 mg/dL (ref 70–99)
Potassium: 3.8 mmol/L (ref 3.5–5.1)
Sodium: 141 mmol/L (ref 135–145)
Total Bilirubin: 0.5 mg/dL (ref 0.3–1.2)
Total Protein: 7.1 g/dL (ref 6.5–8.1)

## 2021-01-06 MED ORDER — DIPHENHYDRAMINE HCL 25 MG PO CAPS
25.0000 mg | ORAL_CAPSULE | Freq: Once | ORAL | Status: AC
  Administered 2021-01-06: 25 mg via ORAL
  Filled 2021-01-06: qty 1

## 2021-01-06 MED ORDER — SODIUM CHLORIDE 0.9% FLUSH
10.0000 mL | INTRAVENOUS | Status: DC | PRN
  Administered 2021-01-06: 10 mL

## 2021-01-06 MED ORDER — ACETAMINOPHEN 325 MG PO TABS
650.0000 mg | ORAL_TABLET | Freq: Once | ORAL | Status: AC
  Administered 2021-01-06: 650 mg via ORAL
  Filled 2021-01-06: qty 2

## 2021-01-06 MED ORDER — SODIUM CHLORIDE 0.9 % IV SOLN: Freq: Once | INTRAVENOUS | Status: AC

## 2021-01-06 MED ORDER — HEPARIN SOD (PORK) LOCK FLUSH 100 UNIT/ML IV SOLN
500.0000 [IU] | Freq: Once | INTRAVENOUS | Status: AC | PRN
  Administered 2021-01-06: 500 [IU]

## 2021-01-06 MED ORDER — SODIUM CHLORIDE 0.9 % IV SOLN
3.6000 mg/kg | Freq: Once | INTRAVENOUS | Status: AC
  Administered 2021-01-06: 200 mg via INTRAVENOUS
  Filled 2021-01-06: qty 10

## 2021-01-06 MED ORDER — SODIUM CHLORIDE 0.9% FLUSH
10.0000 mL | Freq: Once | INTRAVENOUS | Status: AC
  Administered 2021-01-06: 10 mL

## 2021-01-06 NOTE — Patient Instructions (Signed)
[facility] MEDICAL ONCOLOGY  Discharge Instructions: ?Thank you for choosing [facility] to provide your oncology and hematology care.  ? ?If you have a lab appointment with the Cancer Center, please go directly to the Cancer Center and check in at the registration area. ?  ?Wear comfortable clothing and clothing appropriate for easy access to any Portacath or PICC line.  ? ?We strive to give you quality time with your provider. You may need to reschedule your appointment if you arrive late (15 or more minutes).  Arriving late affects you and other patients whose appointments are after yours.  Also, if you miss three or more appointments without notifying the office, you may be dismissed from the clinic at the provider?s discretion.    ?  ?For prescription refill requests, have your pharmacy contact our office and allow 72 hours for refills to be completed.   ? ?Today you received the following chemotherapy and/or immunotherapy agent: Kadcyla    ?  ?To help prevent nausea and vomiting after your treatment, we encourage you to take your nausea medication as directed. ? ?BELOW ARE SYMPTOMS THAT SHOULD BE REPORTED IMMEDIATELY: ?*FEVER GREATER THAN 100.4 F (38 ?C) OR HIGHER ?*CHILLS OR SWEATING ?*NAUSEA AND VOMITING THAT IS NOT CONTROLLED WITH YOUR NAUSEA MEDICATION ?*UNUSUAL SHORTNESS OF BREATH ?*UNUSUAL BRUISING OR BLEEDING ?*URINARY PROBLEMS (pain or burning when urinating, or frequent urination) ?*BOWEL PROBLEMS (unusual diarrhea, constipation, pain near the anus) ?TENDERNESS IN MOUTH AND THROAT WITH OR WITHOUT PRESENCE OF ULCERS (sore throat, sores in mouth, or a toothache) ?UNUSUAL RASH, SWELLING OR PAIN  ?UNUSUAL VAGINAL DISCHARGE OR ITCHING  ? ?Items with * indicate a potential emergency and should be followed up as soon as possible or go to the Emergency Department if any problems should occur. ? ?Please show the CHEMOTHERAPY ALERT CARD or IMMUNOTHERAPY ALERT CARD at check-in to the  Emergency Department and triage nurse. ? ?Should you have questions after your visit or need to cancel or reschedule your appointment, please contact [facility]COL[facility]  Dept: [phone]  and follow the prompts.  Office hours are 8:00 a.m. to 4:30 p.m. Monday - Friday. Please note that voicemails left after 4:00 p.m. may not be returned until the following business day.  We are closed weekends and major holidays. You have access to a nurse at all times for urgent questions. Please call the main number to the clinic Dept: [phone] and follow the prompts. ? ? ?For any non-urgent questions, you may also contact your provider using MyChart. We now offer e-Visits for anyone 18 and older to request care online for non-urgent symptoms. For details visit [email or web address]. ?  ?Also download the MyChart app! Go to the app store, search "MyChart", open the app, select , and log in with your MyChart username and password. ? ?Due to Covid, a mask is required upon entering the hospital/clinic. If you do not have a mask, one will be given to you upon arrival. For doctor visits, patients may have 1 support person aged 18 or older with them. For treatment visits, patients cannot have anyone with them due to current Covid guidelines and our immunocompromised population.  ? ?

## 2021-01-09 ENCOUNTER — Other Ambulatory Visit: Payer: Self-pay

## 2021-01-09 ENCOUNTER — Ambulatory Visit
Admission: RE | Admit: 2021-01-09 | Discharge: 2021-01-09 | Disposition: A | Discharge status: Home / Self Care | Payer: Medicare HMO | Source: Ambulatory Visit | Attending: Radiation Oncology | Admitting: Radiation Oncology

## 2021-01-09 DIAGNOSIS — Z170 Estrogen receptor positive status [ER+]: Secondary | ICD-10-CM | POA: Diagnosis not present

## 2021-01-09 DIAGNOSIS — C50011 Malignant neoplasm of nipple and areola, right female breast: Secondary | ICD-10-CM | POA: Diagnosis not present

## 2021-01-09 DIAGNOSIS — C50111 Malignant neoplasm of central portion of right female breast: Secondary | ICD-10-CM | POA: Diagnosis not present

## 2021-01-09 MED ORDER — RADIAPLEXRX EX GEL: Freq: Once | CUTANEOUS | Status: AC

## 2021-01-10 ENCOUNTER — Ambulatory Visit
Admission: RE | Admit: 2021-01-10 | Discharge: 2021-01-10 | Disposition: A | Discharge status: Home / Self Care | Payer: Medicare HMO | Source: Ambulatory Visit | Attending: Radiation Oncology | Admitting: Radiation Oncology

## 2021-01-10 DIAGNOSIS — C50011 Malignant neoplasm of nipple and areola, right female breast: Secondary | ICD-10-CM | POA: Diagnosis not present

## 2021-01-10 DIAGNOSIS — Z170 Estrogen receptor positive status [ER+]: Secondary | ICD-10-CM | POA: Diagnosis not present

## 2021-01-10 DIAGNOSIS — C50111 Malignant neoplasm of central portion of right female breast: Secondary | ICD-10-CM | POA: Diagnosis not present

## 2021-01-11 ENCOUNTER — Ambulatory Visit
Admission: RE | Admit: 2021-01-11 | Discharge: 2021-01-11 | Disposition: A | Discharge status: Home / Self Care | Payer: Medicare HMO | Source: Ambulatory Visit | Attending: Radiation Oncology | Admitting: Radiation Oncology

## 2021-01-11 ENCOUNTER — Other Ambulatory Visit: Payer: Self-pay

## 2021-01-11 DIAGNOSIS — C50011 Malignant neoplasm of nipple and areola, right female breast: Secondary | ICD-10-CM | POA: Diagnosis not present

## 2021-01-11 DIAGNOSIS — C50111 Malignant neoplasm of central portion of right female breast: Secondary | ICD-10-CM | POA: Diagnosis not present

## 2021-01-11 DIAGNOSIS — Z170 Estrogen receptor positive status [ER+]: Secondary | ICD-10-CM | POA: Diagnosis not present

## 2021-01-12 ENCOUNTER — Ambulatory Visit
Admission: RE | Admit: 2021-01-12 | Discharge: 2021-01-12 | Disposition: A | Discharge status: Home / Self Care | Payer: Medicare HMO | Source: Ambulatory Visit | Attending: Radiation Oncology | Admitting: Radiation Oncology

## 2021-01-12 DIAGNOSIS — C50011 Malignant neoplasm of nipple and areola, right female breast: Secondary | ICD-10-CM | POA: Insufficient documentation

## 2021-01-12 DIAGNOSIS — Z170 Estrogen receptor positive status [ER+]: Secondary | ICD-10-CM | POA: Insufficient documentation

## 2021-01-12 DIAGNOSIS — C50111 Malignant neoplasm of central portion of right female breast: Secondary | ICD-10-CM | POA: Insufficient documentation

## 2021-01-12 DIAGNOSIS — Z5112 Encounter for antineoplastic immunotherapy: Secondary | ICD-10-CM | POA: Diagnosis not present

## 2021-01-13 ENCOUNTER — Encounter: Payer: Self-pay | Admitting: *Deleted

## 2021-01-13 ENCOUNTER — Other Ambulatory Visit: Payer: Self-pay

## 2021-01-13 ENCOUNTER — Ambulatory Visit
Admission: RE | Admit: 2021-01-13 | Discharge: 2021-01-13 | Disposition: A | Discharge status: Home / Self Care | Payer: Medicare HMO | Source: Ambulatory Visit | Attending: Radiation Oncology | Admitting: Radiation Oncology

## 2021-01-13 ENCOUNTER — Encounter: Payer: Self-pay | Admitting: General Practice

## 2021-01-13 DIAGNOSIS — Z170 Estrogen receptor positive status [ER+]: Secondary | ICD-10-CM | POA: Diagnosis not present

## 2021-01-13 DIAGNOSIS — C50011 Malignant neoplasm of nipple and areola, right female breast: Secondary | ICD-10-CM | POA: Diagnosis not present

## 2021-01-13 DIAGNOSIS — C50111 Malignant neoplasm of central portion of right female breast: Secondary | ICD-10-CM | POA: Diagnosis not present

## 2021-01-13 DIAGNOSIS — Z5112 Encounter for antineoplastic immunotherapy: Secondary | ICD-10-CM | POA: Diagnosis not present

## 2021-01-13 NOTE — Progress Notes (Signed)
CSW received notification patient and daughter came by support center requesting to see CSW.   CSW contacted patient's daughter for follow up. Daughter unable to speak at time of call- plans to follow up on Monday.  Maryjean Morn, MSW, LCSW, OSW-C Clinical Social Worker [facility] [phone]

## 2021-01-13 NOTE — Progress Notes (Signed)
[facility] Spiritual Care Note  Greeted Jordanne in [employer]. She was unable to catch a Education officer, museum, so I referred her financial concerns to SW. Also introduced Spiritual Care as part of her support team and ensured she knows how to reach me.   [place], [place], [facility] Pager [phone] Voicemail [phone]

## 2021-01-16 ENCOUNTER — Ambulatory Visit: Payer: Medicare HMO

## 2021-01-16 ENCOUNTER — Ambulatory Visit: Payer: Medicare HMO | Admitting: Radiation Oncology

## 2021-01-17 ENCOUNTER — Ambulatory Visit
Admission: RE | Admit: 2021-01-17 | Discharge: 2021-01-17 | Disposition: A | Discharge status: Home / Self Care | Payer: Medicare HMO | Source: Ambulatory Visit | Attending: Radiation Oncology | Admitting: Radiation Oncology

## 2021-01-17 ENCOUNTER — Other Ambulatory Visit: Payer: Self-pay

## 2021-01-17 DIAGNOSIS — C50011 Malignant neoplasm of nipple and areola, right female breast: Secondary | ICD-10-CM | POA: Diagnosis not present

## 2021-01-17 DIAGNOSIS — Z170 Estrogen receptor positive status [ER+]: Secondary | ICD-10-CM | POA: Diagnosis not present

## 2021-01-17 DIAGNOSIS — Z5112 Encounter for antineoplastic immunotherapy: Secondary | ICD-10-CM | POA: Diagnosis not present

## 2021-01-17 DIAGNOSIS — C50111 Malignant neoplasm of central portion of right female breast: Secondary | ICD-10-CM | POA: Diagnosis not present

## 2021-01-17 MED ORDER — RADIAPLEXRX EX GEL: Freq: Once | CUTANEOUS | Status: AC

## 2021-01-18 ENCOUNTER — Ambulatory Visit
Admission: RE | Admit: 2021-01-18 | Discharge: 2021-01-18 | Disposition: A | Discharge status: Home / Self Care | Payer: Medicare HMO | Source: Ambulatory Visit | Attending: Radiation Oncology | Admitting: Radiation Oncology

## 2021-01-18 DIAGNOSIS — Z170 Estrogen receptor positive status [ER+]: Secondary | ICD-10-CM | POA: Diagnosis not present

## 2021-01-18 DIAGNOSIS — Z5112 Encounter for antineoplastic immunotherapy: Secondary | ICD-10-CM | POA: Diagnosis not present

## 2021-01-18 DIAGNOSIS — C50011 Malignant neoplasm of nipple and areola, right female breast: Secondary | ICD-10-CM | POA: Diagnosis not present

## 2021-01-18 DIAGNOSIS — C50111 Malignant neoplasm of central portion of right female breast: Secondary | ICD-10-CM | POA: Diagnosis not present

## 2021-01-19 ENCOUNTER — Ambulatory Visit
Admission: RE | Admit: 2021-01-19 | Discharge: 2021-01-19 | Disposition: A | Discharge status: Home / Self Care | Payer: Medicare HMO | Source: Ambulatory Visit | Attending: Radiation Oncology | Admitting: Radiation Oncology

## 2021-01-19 ENCOUNTER — Other Ambulatory Visit: Payer: Self-pay

## 2021-01-19 DIAGNOSIS — C50011 Malignant neoplasm of nipple and areola, right female breast: Secondary | ICD-10-CM | POA: Diagnosis not present

## 2021-01-19 DIAGNOSIS — Z5112 Encounter for antineoplastic immunotherapy: Secondary | ICD-10-CM | POA: Diagnosis not present

## 2021-01-19 DIAGNOSIS — Z170 Estrogen receptor positive status [ER+]: Secondary | ICD-10-CM | POA: Diagnosis not present

## 2021-01-19 DIAGNOSIS — C50111 Malignant neoplasm of central portion of right female breast: Secondary | ICD-10-CM | POA: Diagnosis not present

## 2021-01-20 ENCOUNTER — Ambulatory Visit
Admission: RE | Admit: 2021-01-20 | Discharge: 2021-01-20 | Disposition: A | Discharge status: Home / Self Care | Payer: Medicare HMO | Source: Ambulatory Visit | Attending: Radiation Oncology | Admitting: Radiation Oncology

## 2021-01-20 DIAGNOSIS — Z170 Estrogen receptor positive status [ER+]: Secondary | ICD-10-CM | POA: Diagnosis not present

## 2021-01-20 DIAGNOSIS — C50011 Malignant neoplasm of nipple and areola, right female breast: Secondary | ICD-10-CM | POA: Diagnosis not present

## 2021-01-20 DIAGNOSIS — C50111 Malignant neoplasm of central portion of right female breast: Secondary | ICD-10-CM | POA: Diagnosis not present

## 2021-01-20 DIAGNOSIS — Z5112 Encounter for antineoplastic immunotherapy: Secondary | ICD-10-CM | POA: Diagnosis not present

## 2021-01-23 ENCOUNTER — Ambulatory Visit: Payer: Medicare HMO | Admitting: Radiation Oncology

## 2021-01-23 ENCOUNTER — Other Ambulatory Visit: Payer: Self-pay

## 2021-01-23 ENCOUNTER — Ambulatory Visit: Payer: Medicare HMO

## 2021-01-23 DIAGNOSIS — C50011 Malignant neoplasm of nipple and areola, right female breast: Secondary | ICD-10-CM | POA: Diagnosis not present

## 2021-01-23 DIAGNOSIS — Z5112 Encounter for antineoplastic immunotherapy: Secondary | ICD-10-CM | POA: Diagnosis not present

## 2021-01-23 DIAGNOSIS — C50111 Malignant neoplasm of central portion of right female breast: Secondary | ICD-10-CM | POA: Diagnosis not present

## 2021-01-23 DIAGNOSIS — Z170 Estrogen receptor positive status [ER+]: Secondary | ICD-10-CM | POA: Diagnosis not present

## 2021-01-24 ENCOUNTER — Ambulatory Visit
Admission: RE | Admit: 2021-01-24 | Discharge: 2021-01-24 | Disposition: A | Discharge status: Home / Self Care | Payer: Medicare HMO | Source: Ambulatory Visit | Attending: Radiation Oncology | Admitting: Radiation Oncology

## 2021-01-24 ENCOUNTER — Ambulatory Visit: Payer: Medicare HMO

## 2021-01-24 DIAGNOSIS — Z5112 Encounter for antineoplastic immunotherapy: Secondary | ICD-10-CM | POA: Diagnosis not present

## 2021-01-24 DIAGNOSIS — C50111 Malignant neoplasm of central portion of right female breast: Secondary | ICD-10-CM | POA: Diagnosis not present

## 2021-01-24 DIAGNOSIS — Z170 Estrogen receptor positive status [ER+]: Secondary | ICD-10-CM | POA: Diagnosis not present

## 2021-01-24 DIAGNOSIS — C50011 Malignant neoplasm of nipple and areola, right female breast: Secondary | ICD-10-CM | POA: Diagnosis not present

## 2021-01-24 NOTE — Progress Notes (Signed)
Patient Care Team: Pcp, No as PCP - General [name], MD as PCP - Cardiology (Cardiology) Mauro Kaufmann, RN as Oncology Nurse Navigator Rockwell Germany, RN as Oncology Nurse Navigator Erroll Luna, MD as Consulting Physician (General Surgery) Nicholas Lose, MD as Consulting Physician (Hematology and Oncology)  DIAGNOSIS:    ICD-10-CM   1. Malignant neoplasm involving both nipple and areola of right breast in female, estrogen receptor positive ([place])  C50.011    Z17.0       SUMMARY OF ONCOLOGIC HISTORY: Oncology History  Malignant neoplasm of right breast in female, estrogen receptor positive ([place])  04/18/2020 Initial Diagnosis   Patient presented to the ED with complaint of syncope and found to have a fungating right breast mass. CT showed a right breast mass, 8.1cm, and right axillary adenopathy. Patient reported a right breast mass present for past 2-3 years. Biopsy showed IDC, grade 3, HER-2 equivocal by IHC, positive by FISH (ratio 3.56), ER+ 95%, PR+ 70%, ,Ki67 25%.    04/18/2020 Cancer Staging   Staging form: Breast, AJCC 8th Edition - Clinical stage from 04/18/2020: Stage IIIB (cT4b, cN1, cM0, G3, ER+, PR+, HER2+) - Signed by Gardenia Phlegm, NP on 04/27/2020 Stage prefix: Initial diagnosis Histologic grading system: 3 grade system    05/13/2020 - 08/29/2020 Chemotherapy          10/11/2020 -  Chemotherapy   Patient is on Treatment Plan : BREAST ADO-Trastuzumab Emtansine (Kadcyla) q21d       CHIEF COMPLIANT: Kadcyla cycle 6 maintenance  INTERVAL HISTORY: Elizabeth Chase is a 80 y.o. with above-mentioned history of HER-2 positive right breast cancer currently on chemotherapy with Kadcyla. She presents to the clinic today for treatment.   ALLERGIES:  is allergic to bee venom.  MEDICATIONS:  Current Outpatient Medications  Medication Sig Dispense Refill   albuterol (VENTOLIN HFA) 108 (90 Base) MCG/ACT inhaler TAKE 2 PUFFS BY MOUTH EVERY 6 HOURS AS  NEEDED FOR WHEEZE OR SHORTNESS OF BREATH 8.5 each 2   atorvastatin (LIPITOR) 20 MG tablet Take 1 tablet (20 mg total) by mouth daily at 6 PM. (Patient taking differently: Take 20 mg by mouth in the morning.) 30 tablet 2   cholecalciferol (VITAMIN D) 25 MCG (1000 UNIT) tablet Take 1,000 Units by mouth in the morning.     feeding supplement (BOOST HIGH PROTEIN) LIQD Take 237 mLs by mouth 3 (three) times daily between meals. Please give her 90 bottles (Patient taking differently: Take 0.5 Containers by mouth in the morning and at bedtime. Please give her 90 bottles) 237 mL 6   loratadine (CLARITIN) 10 MG tablet Take 10 mg by mouth See admin instructions. Take 1 tablet (10 mg) by mouth for 4 days after chemotherapy     trazodone (DESYREL) 300 MG tablet TAKE 1 TABLET BY MOUTH EVERYDAY AT BEDTIME 90 tablet 0   No current facility-administered medications for this visit.    PHYSICAL EXAMINATION: ECOG PERFORMANCE STATUS: 1 - Symptomatic but completely ambulatory  Vitals:   01/25/21 1202  BP: 131/60  Pulse: 83  Resp: 18  Temp: (!) 97.5 F (36.4 C)  SpO2: 93%   Filed Weights   01/25/21 1202  Weight: 118 lb 9.6 oz (53.8 kg)    LABORATORY DATA:  I have reviewed the data as listed CMP Latest Ref Rng & Units 01/25/2021 01/06/2021 12/15/2020  Glucose 70 - 99 mg/dL 82 98 117(H)  BUN 8 - 23 mg/dL $Remove'21 17 13  'zYIwTNl$ Creatinine 0.44 -  1.00 mg/dL 0.78 0.79 0.75  Sodium 135 - 145 mmol/L 141 141 141  Potassium 3.5 - 5.1 mmol/L 3.7 3.8 3.5  Chloride 98 - 111 mmol/L 107 107 105  CO2 22 - 32 mmol/L $RemoveB'25 26 27  'xTpTuNkv$ Calcium 8.9 - 10.3 mg/dL 8.7(L) 8.8(L) 8.6(L)  Total Protein 6.5 - 8.1 g/dL 7.4 7.1 6.8  Total Bilirubin 0.3 - 1.2 mg/dL 0.4 0.5 0.4  Alkaline Phos 38 - 126 U/L 65 62 68  AST 15 - 41 U/L $Remo'21 18 17  'UXscm$ ALT 0 - 44 U/L $Remo'12 11 10    'qTHNh$ Lab Results  Component Value Date   WBC 4.2 01/25/2021   HGB 11.7 (L) 01/25/2021   HCT 36.0 01/25/2021   MCV 80.7 01/25/2021   PLT 140 (L) 01/25/2021   NEUTROABS 3.0  01/25/2021    ASSESSMENT & PLAN:  Malignant neoplasm of right breast in female, estrogen receptor positive ([place]) Stage IIIc fungating tumor of the right breast: ER/PR and HER-2 positive 04/15/20: CT CAP: No distant mets   Treatment Plan: 1. Neoadj chemo with Taxotere Herceptin Perjeta x6 cycles completed 08/26/2020 followed by [employer] Drone maintenance 2. 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 Adj XRT 4. Adj Anti estrogen therapy -------------------------------------------------------------------------------------------------- Current treatment: Kadcyla maintenance started 10/11/2020, ongoing radiation which will be completed 02/10/2021 Kadcyla toxicities: Denies any adverse effects to Kadcyla Chemotherapy-induced anemia: Monitoring today's hemoglobin is 11.7 Monitoring for neuropathy as well: Patient has mild neuropathy in the right hand.   Radiation dermatitis causing itching Return to clinic every 3 weeks for Kadcyla and every 6 weeks with follow-up with me.    No orders of the defined types were placed in this encounter.  The patient has a good understanding of the overall plan. she agrees with it. she will call with any problems that may develop before the next visit here.  Total time spent: 30 mins including face to face time and time spent for planning, charting and coordination of care  Rulon Eisenmenger, MD, MPH 01/25/2021  I, Thana Ates, am acting as scribe for Dr. Nicholas Lose.  I have reviewed the above documentation for accuracy and completeness, and I agree with the above.

## 2021-01-24 NOTE — Assessment & Plan Note (Signed)
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 Neoadj chemo withTaxotere Herceptin Perjetax6 cycles completed 08/26/2020 followed by [employer] Drone maintenance 2. Mastectomywith sentinel node biopsy: 09/20/2020: Residual invasive ductal carcinoma 7.4 cm invades dermis and skin and skeletal muscle, 4/14 lymph nodes positive, margins negative, lymphovascular invasion present, ER 95%, PR 20%, HER2 positive, Ki-67 25% 3. Adj XRT 4. Adj Anti estrogen therapy -------------------------------------------------------------------------------------------------- Current treatment: Kadcyla maintenance started 10/11/2020 Kadcyla toxicities: Denies any adverse effects to Kadcyla Chemotherapy-induced anemia: Monitoring Monitoring for neuropathy as well.  Return to clinic every 3 weeks for Kadcyla and every 6 weeks with follow-up with me.

## 2021-01-25 ENCOUNTER — Other Ambulatory Visit: Payer: Self-pay

## 2021-01-25 ENCOUNTER — Inpatient Hospital Stay: Payer: Medicare HMO

## 2021-01-25 ENCOUNTER — Inpatient Hospital Stay: Payer: Medicare HMO | Attending: Hematology and Oncology

## 2021-01-25 ENCOUNTER — Inpatient Hospital Stay (HOSPITAL_BASED_OUTPATIENT_CLINIC_OR_DEPARTMENT_OTHER): Payer: Medicare HMO | Admitting: Hematology and Oncology

## 2021-01-25 ENCOUNTER — Ambulatory Visit
Admission: RE | Admit: 2021-01-25 | Discharge: 2021-01-25 | Disposition: A | Discharge status: Home / Self Care | Payer: Medicare HMO | Source: Ambulatory Visit | Attending: Radiation Oncology | Admitting: Radiation Oncology

## 2021-01-25 ENCOUNTER — Ambulatory Visit: Payer: Medicare HMO

## 2021-01-25 DIAGNOSIS — C50911 Malignant neoplasm of unspecified site of right female breast: Secondary | ICD-10-CM

## 2021-01-25 DIAGNOSIS — Z170 Estrogen receptor positive status [ER+]: Secondary | ICD-10-CM | POA: Insufficient documentation

## 2021-01-25 DIAGNOSIS — C50111 Malignant neoplasm of central portion of right female breast: Secondary | ICD-10-CM | POA: Diagnosis not present

## 2021-01-25 DIAGNOSIS — C50011 Malignant neoplasm of nipple and areola, right female breast: Secondary | ICD-10-CM | POA: Diagnosis not present

## 2021-01-25 DIAGNOSIS — Z95828 Presence of other vascular implants and grafts: Secondary | ICD-10-CM

## 2021-01-25 DIAGNOSIS — Z5112 Encounter for antineoplastic immunotherapy: Secondary | ICD-10-CM | POA: Insufficient documentation

## 2021-01-25 LAB — CBC WITH DIFFERENTIAL (CANCER CENTER ONLY)
Abs Immature Granulocytes: 0.01 10*3/uL (ref 0.00–0.07)
Basophils Absolute: 0 10*3/uL (ref 0.0–0.1)
Basophils Relative: 1 %
Eosinophils Absolute: 0.1 10*3/uL (ref 0.0–0.5)
Eosinophils Relative: 1 %
HCT: 36 % (ref 36.0–46.0)
Hemoglobin: 11.7 g/dL — ABNORMAL LOW (ref 12.0–15.0)
Immature Granulocytes: 0 %
Lymphocytes Relative: 15 %
Lymphs Abs: 0.6 10*3/uL — ABNORMAL LOW (ref 0.7–4.0)
MCH: 26.2 pg (ref 26.0–34.0)
MCHC: 32.5 g/dL (ref 30.0–36.0)
MCV: 80.7 fL (ref 80.0–100.0)
Monocytes Absolute: 0.5 10*3/uL (ref 0.1–1.0)
Monocytes Relative: 12 %
Neutro Abs: 3 10*3/uL (ref 1.7–7.7)
Neutrophils Relative %: 71 %
Platelet Count: 140 10*3/uL — ABNORMAL LOW (ref 150–400)
RBC: 4.46 MIL/uL (ref 3.87–5.11)
RDW: 19 % — ABNORMAL HIGH (ref 11.5–15.5)
WBC Count: 4.2 10*3/uL (ref 4.0–10.5)
nRBC: 0 % (ref 0.0–0.2)

## 2021-01-25 LAB — CMP (CANCER CENTER ONLY)
ALT: 12 U/L (ref 0–44)
AST: 21 U/L (ref 15–41)
Albumin: 3.4 g/dL — ABNORMAL LOW (ref 3.5–5.0)
Alkaline Phosphatase: 65 U/L (ref 38–126)
Anion gap: 9 (ref 5–15)
BUN: 21 mg/dL (ref 8–23)
CO2: 25 mmol/L (ref 22–32)
Calcium: 8.7 mg/dL — ABNORMAL LOW (ref 8.9–10.3)
Chloride: 107 mmol/L (ref 98–111)
Creatinine: 0.78 mg/dL (ref 0.44–1.00)
GFR, Estimated: >60 mL/min (ref >60)
Glucose, Bld: 82 mg/dL (ref 70–99)
Potassium: 3.7 mmol/L (ref 3.5–5.1)
Sodium: 141 mmol/L (ref 135–145)
Total Bilirubin: 0.4 mg/dL (ref 0.3–1.2)
Total Protein: 7.4 g/dL (ref 6.5–8.1)

## 2021-01-25 MED ORDER — DIPHENHYDRAMINE HCL 25 MG PO CAPS
25.0000 mg | ORAL_CAPSULE | Freq: Once | ORAL | Status: AC
  Administered 2021-01-25: 13:00:00 25 mg via ORAL
  Filled 2021-01-25: qty 1

## 2021-01-25 MED ORDER — RADIAPLEXRX EX GEL: Freq: Once | CUTANEOUS | Status: AC

## 2021-01-25 MED ORDER — SODIUM CHLORIDE 0.9% FLUSH
10.0000 mL | Freq: Once | INTRAVENOUS | Status: AC
Start: 2021-01-25 — End: 2021-01-25
  Administered 2021-01-25: 11:00:00 10 mL

## 2021-01-25 MED ORDER — SODIUM CHLORIDE 0.9% FLUSH
10.0000 mL | INTRAVENOUS | Status: DC | PRN
  Administered 2021-01-25: 15:00:00 10 mL

## 2021-01-25 MED ORDER — SODIUM CHLORIDE 0.9 % IV SOLN
3.6000 mg/kg | Freq: Once | INTRAVENOUS | Status: AC
  Administered 2021-01-25: 14:00:00 200 mg via INTRAVENOUS
  Filled 2021-01-25: qty 10

## 2021-01-25 MED ORDER — HEPARIN SOD (PORK) LOCK FLUSH 100 UNIT/ML IV SOLN
500.0000 [IU] | Freq: Once | INTRAVENOUS | Status: AC | PRN
  Administered 2021-01-25: 15:00:00 500 [IU]

## 2021-01-25 MED ORDER — ACETAMINOPHEN 325 MG PO TABS
650.0000 mg | ORAL_TABLET | Freq: Once | ORAL | Status: AC
  Administered 2021-01-25: 13:00:00 650 mg via ORAL
  Filled 2021-01-25: qty 2

## 2021-01-25 MED ORDER — SODIUM CHLORIDE 0.9 % IV SOLN: Freq: Once | INTRAVENOUS | Status: AC

## 2021-01-25 NOTE — Patient Instructions (Signed)
[facility]  Discharge Instructions: °Thank you for choosing [facility] to provide your oncology and hematology care.  ° °If you have a lab appointment with the Cancer Center, please go directly to the Cancer Center and check in at the registration area. °  °Wear comfortable clothing and clothing appropriate for easy access to any Portacath or PICC line.  ° °We strive to give you quality time with your provider. You may need to reschedule your appointment if you arrive late (15 or more minutes).  Arriving late affects you and other patients whose appointments are after yours.  Also, if you miss three or more appointments without notifying the office, you may be dismissed from the clinic at the provider’s discretion.    °  °For prescription refill requests, have your pharmacy contact our office and allow 72 hours for refills to be completed.   ° °Today you received the following chemotherapy and/or immunotherapy agents Kadcyla    °  °To help prevent nausea and vomiting after your treatment, we encourage you to take your nausea medication as directed. ° °BELOW ARE SYMPTOMS THAT SHOULD BE REPORTED IMMEDIATELY: °*FEVER GREATER THAN 100.4 F (38 °C) OR HIGHER °*CHILLS OR SWEATING °*NAUSEA AND VOMITING THAT IS NOT CONTROLLED WITH YOUR NAUSEA MEDICATION °*UNUSUAL SHORTNESS OF BREATH °*UNUSUAL BRUISING OR BLEEDING °*URINARY PROBLEMS (pain or burning when urinating, or frequent urination) °*BOWEL PROBLEMS (unusual diarrhea, constipation, pain near the anus) °TENDERNESS IN MOUTH AND THROAT WITH OR WITHOUT PRESENCE OF ULCERS (sore throat, sores in mouth, or a toothache) °UNUSUAL RASH, SWELLING OR PAIN  °UNUSUAL VAGINAL DISCHARGE OR ITCHING  ° °Items with * indicate a potential emergency and should be followed up as soon as possible or go to the Emergency Department if any problems should occur. ° °Please show the CHEMOTHERAPY ALERT CARD or IMMUNOTHERAPY ALERT CARD at check-in to the  Emergency Department and triage nurse. ° °Should you have questions after your visit or need to cancel or reschedule your appointment, please contact [facility]  Dept: [phone]  and follow the prompts.  Office hours are 8:00 a.m. to 4:30 p.m. Monday - Friday. Please note that voicemails left after 4:00 p.m. may not be returned until the following business day.  We are closed weekends and major holidays. You have access to a nurse at all times for urgent questions. Please call the main number to the clinic Dept: [phone] and follow the prompts. ° ° °For any non-urgent questions, you may also contact your provider using MyChart. We now offer e-Visits for anyone 18 and older to request care online for non-urgent symptoms. For details visit [email or web address]. °  °Also download the MyChart app! Go to the app store, search "MyChart", open the app, select [facility], and log in with your MyChart username and password. ° °Due to Covid, a mask is required upon entering the hospital/clinic. If you do not have a mask, one will be given to you upon arrival. For doctor visits, patients may have 1 support person aged 18 or older with them. For treatment visits, patients cannot have anyone with them due to current Covid guidelines and our immunocompromised population.  ° °

## 2021-01-26 ENCOUNTER — Other Ambulatory Visit: Payer: Self-pay

## 2021-01-26 ENCOUNTER — Ambulatory Visit
Admission: RE | Admit: 2021-01-26 | Discharge: 2021-01-26 | Disposition: A | Discharge status: Home / Self Care | Payer: Medicare HMO | Source: Ambulatory Visit | Attending: Radiation Oncology | Admitting: Radiation Oncology

## 2021-01-26 DIAGNOSIS — C50011 Malignant neoplasm of nipple and areola, right female breast: Secondary | ICD-10-CM | POA: Diagnosis not present

## 2021-01-26 DIAGNOSIS — C50111 Malignant neoplasm of central portion of right female breast: Secondary | ICD-10-CM | POA: Diagnosis not present

## 2021-01-26 DIAGNOSIS — Z5112 Encounter for antineoplastic immunotherapy: Secondary | ICD-10-CM | POA: Diagnosis not present

## 2021-01-26 DIAGNOSIS — Z170 Estrogen receptor positive status [ER+]: Secondary | ICD-10-CM | POA: Diagnosis not present

## 2021-01-27 ENCOUNTER — Other Ambulatory Visit: Payer: Self-pay

## 2021-01-27 ENCOUNTER — Ambulatory Visit
Admission: RE | Admit: 2021-01-27 | Discharge: 2021-01-27 | Disposition: A | Discharge status: Home / Self Care | Payer: Medicare HMO | Source: Ambulatory Visit | Attending: Radiation Oncology | Admitting: Radiation Oncology

## 2021-01-27 DIAGNOSIS — Z170 Estrogen receptor positive status [ER+]: Secondary | ICD-10-CM | POA: Diagnosis not present

## 2021-01-27 DIAGNOSIS — C50011 Malignant neoplasm of nipple and areola, right female breast: Secondary | ICD-10-CM | POA: Diagnosis not present

## 2021-01-27 DIAGNOSIS — Z5112 Encounter for antineoplastic immunotherapy: Secondary | ICD-10-CM | POA: Diagnosis not present

## 2021-01-27 DIAGNOSIS — C50111 Malignant neoplasm of central portion of right female breast: Secondary | ICD-10-CM | POA: Diagnosis not present

## 2021-01-30 ENCOUNTER — Ambulatory Visit
Admission: RE | Admit: 2021-01-30 | Discharge: 2021-01-30 | Disposition: A | Discharge status: Home / Self Care | Payer: Medicare HMO | Source: Ambulatory Visit | Attending: Radiation Oncology | Admitting: Radiation Oncology

## 2021-01-30 ENCOUNTER — Encounter: Payer: Self-pay | Admitting: *Deleted

## 2021-01-30 ENCOUNTER — Other Ambulatory Visit: Payer: Self-pay

## 2021-01-30 DIAGNOSIS — Z5112 Encounter for antineoplastic immunotherapy: Secondary | ICD-10-CM | POA: Diagnosis not present

## 2021-01-30 DIAGNOSIS — C50111 Malignant neoplasm of central portion of right female breast: Secondary | ICD-10-CM | POA: Diagnosis not present

## 2021-01-30 DIAGNOSIS — Z170 Estrogen receptor positive status [ER+]: Secondary | ICD-10-CM | POA: Diagnosis not present

## 2021-01-30 DIAGNOSIS — C50011 Malignant neoplasm of nipple and areola, right female breast: Secondary | ICD-10-CM | POA: Diagnosis not present

## 2021-01-31 ENCOUNTER — Ambulatory Visit: Payer: Medicare HMO

## 2021-01-31 ENCOUNTER — Ambulatory Visit
Admission: RE | Admit: 2021-01-31 | Discharge: 2021-01-31 | Disposition: A | Discharge status: Home / Self Care | Payer: Medicare HMO | Source: Ambulatory Visit | Attending: Radiation Oncology | Admitting: Radiation Oncology

## 2021-01-31 DIAGNOSIS — Z170 Estrogen receptor positive status [ER+]: Secondary | ICD-10-CM | POA: Diagnosis not present

## 2021-01-31 DIAGNOSIS — C50011 Malignant neoplasm of nipple and areola, right female breast: Secondary | ICD-10-CM | POA: Diagnosis not present

## 2021-01-31 DIAGNOSIS — Z5112 Encounter for antineoplastic immunotherapy: Secondary | ICD-10-CM | POA: Diagnosis not present

## 2021-01-31 DIAGNOSIS — C50111 Malignant neoplasm of central portion of right female breast: Secondary | ICD-10-CM | POA: Diagnosis not present

## 2021-02-01 ENCOUNTER — Other Ambulatory Visit: Payer: Self-pay

## 2021-02-01 ENCOUNTER — Ambulatory Visit: Payer: Medicare HMO

## 2021-02-01 DIAGNOSIS — C50011 Malignant neoplasm of nipple and areola, right female breast: Secondary | ICD-10-CM | POA: Diagnosis not present

## 2021-02-01 DIAGNOSIS — Z5112 Encounter for antineoplastic immunotherapy: Secondary | ICD-10-CM | POA: Diagnosis not present

## 2021-02-01 DIAGNOSIS — C50111 Malignant neoplasm of central portion of right female breast: Secondary | ICD-10-CM | POA: Diagnosis not present

## 2021-02-01 DIAGNOSIS — Z170 Estrogen receptor positive status [ER+]: Secondary | ICD-10-CM | POA: Diagnosis not present

## 2021-02-02 ENCOUNTER — Ambulatory Visit: Payer: Medicare HMO

## 2021-02-02 DIAGNOSIS — Z5112 Encounter for antineoplastic immunotherapy: Secondary | ICD-10-CM | POA: Diagnosis not present

## 2021-02-02 DIAGNOSIS — C50111 Malignant neoplasm of central portion of right female breast: Secondary | ICD-10-CM | POA: Diagnosis not present

## 2021-02-02 DIAGNOSIS — C50011 Malignant neoplasm of nipple and areola, right female breast: Secondary | ICD-10-CM | POA: Diagnosis not present

## 2021-02-02 DIAGNOSIS — Z170 Estrogen receptor positive status [ER+]: Secondary | ICD-10-CM | POA: Diagnosis not present

## 2021-02-03 ENCOUNTER — Ambulatory Visit
Admission: RE | Admit: 2021-02-03 | Discharge: 2021-02-03 | Disposition: A | Discharge status: Home / Self Care | Payer: Medicare HMO | Source: Ambulatory Visit | Attending: Radiation Oncology | Admitting: Radiation Oncology

## 2021-02-03 DIAGNOSIS — Z170 Estrogen receptor positive status [ER+]: Secondary | ICD-10-CM | POA: Diagnosis not present

## 2021-02-03 DIAGNOSIS — Z5112 Encounter for antineoplastic immunotherapy: Secondary | ICD-10-CM | POA: Diagnosis not present

## 2021-02-03 DIAGNOSIS — C50011 Malignant neoplasm of nipple and areola, right female breast: Secondary | ICD-10-CM | POA: Diagnosis not present

## 2021-02-03 DIAGNOSIS — C50111 Malignant neoplasm of central portion of right female breast: Secondary | ICD-10-CM | POA: Diagnosis not present

## 2021-02-07 ENCOUNTER — Ambulatory Visit
Admission: RE | Admit: 2021-02-07 | Discharge: 2021-02-07 | Disposition: A | Discharge status: Home / Self Care | Payer: Medicare HMO | Source: Ambulatory Visit | Attending: Radiation Oncology | Admitting: Radiation Oncology

## 2021-02-07 ENCOUNTER — Ambulatory Visit: Payer: Medicare HMO

## 2021-02-07 ENCOUNTER — Other Ambulatory Visit: Payer: Self-pay

## 2021-02-07 DIAGNOSIS — C50111 Malignant neoplasm of central portion of right female breast: Secondary | ICD-10-CM | POA: Diagnosis not present

## 2021-02-07 DIAGNOSIS — C50011 Malignant neoplasm of nipple and areola, right female breast: Secondary | ICD-10-CM | POA: Diagnosis not present

## 2021-02-07 DIAGNOSIS — Z5112 Encounter for antineoplastic immunotherapy: Secondary | ICD-10-CM | POA: Diagnosis not present

## 2021-02-07 DIAGNOSIS — Z170 Estrogen receptor positive status [ER+]: Secondary | ICD-10-CM | POA: Diagnosis not present

## 2021-02-08 ENCOUNTER — Ambulatory Visit: Payer: Medicare HMO

## 2021-02-08 ENCOUNTER — Ambulatory Visit
Admission: RE | Admit: 2021-02-08 | Discharge: 2021-02-08 | Disposition: A | Discharge status: Home / Self Care | Payer: Medicare HMO | Source: Ambulatory Visit | Attending: Radiation Oncology | Admitting: Radiation Oncology

## 2021-02-08 DIAGNOSIS — C50011 Malignant neoplasm of nipple and areola, right female breast: Secondary | ICD-10-CM | POA: Diagnosis not present

## 2021-02-08 DIAGNOSIS — C50111 Malignant neoplasm of central portion of right female breast: Secondary | ICD-10-CM | POA: Diagnosis not present

## 2021-02-08 DIAGNOSIS — Z170 Estrogen receptor positive status [ER+]: Secondary | ICD-10-CM | POA: Diagnosis not present

## 2021-02-08 DIAGNOSIS — Z5112 Encounter for antineoplastic immunotherapy: Secondary | ICD-10-CM | POA: Diagnosis not present

## 2021-02-09 ENCOUNTER — Other Ambulatory Visit: Payer: Self-pay

## 2021-02-09 ENCOUNTER — Encounter: Payer: Self-pay | Admitting: *Deleted

## 2021-02-09 ENCOUNTER — Ambulatory Visit
Admission: RE | Admit: 2021-02-09 | Discharge: 2021-02-09 | Disposition: A | Discharge status: Home / Self Care | Payer: Medicare HMO | Source: Ambulatory Visit | Attending: Radiation Oncology | Admitting: Radiation Oncology

## 2021-02-09 ENCOUNTER — Ambulatory Visit: Payer: Medicare HMO

## 2021-02-09 DIAGNOSIS — Z170 Estrogen receptor positive status [ER+]: Secondary | ICD-10-CM | POA: Diagnosis not present

## 2021-02-09 DIAGNOSIS — C50111 Malignant neoplasm of central portion of right female breast: Secondary | ICD-10-CM

## 2021-02-09 DIAGNOSIS — Z5112 Encounter for antineoplastic immunotherapy: Secondary | ICD-10-CM | POA: Diagnosis not present

## 2021-02-09 DIAGNOSIS — C50011 Malignant neoplasm of nipple and areola, right female breast: Secondary | ICD-10-CM | POA: Diagnosis not present

## 2021-02-09 MED ORDER — RADIAPLEXRX EX GEL: Freq: Once | CUTANEOUS | Status: AC

## 2021-02-10 ENCOUNTER — Encounter: Payer: Self-pay | Admitting: Radiation Oncology

## 2021-02-10 ENCOUNTER — Ambulatory Visit
Admission: RE | Admit: 2021-02-10 | Discharge: 2021-02-10 | Disposition: A | Discharge status: Home / Self Care | Payer: Medicare HMO | Source: Ambulatory Visit | Attending: Radiation Oncology | Admitting: Radiation Oncology

## 2021-02-10 DIAGNOSIS — Z5112 Encounter for antineoplastic immunotherapy: Secondary | ICD-10-CM | POA: Diagnosis not present

## 2021-02-10 DIAGNOSIS — C50011 Malignant neoplasm of nipple and areola, right female breast: Secondary | ICD-10-CM | POA: Diagnosis not present

## 2021-02-10 DIAGNOSIS — Z170 Estrogen receptor positive status [ER+]: Secondary | ICD-10-CM | POA: Diagnosis not present

## 2021-02-10 DIAGNOSIS — C50111 Malignant neoplasm of central portion of right female breast: Secondary | ICD-10-CM | POA: Diagnosis not present

## 2021-02-16 ENCOUNTER — Other Ambulatory Visit: Payer: Medicare HMO

## 2021-02-16 ENCOUNTER — Ambulatory Visit: Payer: Medicare HMO

## 2021-02-17 ENCOUNTER — Inpatient Hospital Stay: Payer: Medicare HMO | Attending: Hematology and Oncology

## 2021-02-17 ENCOUNTER — Other Ambulatory Visit: Payer: Self-pay

## 2021-02-17 ENCOUNTER — Inpatient Hospital Stay: Payer: Medicare HMO

## 2021-02-17 VITALS — BP 143/76 | HR 76 | Temp 98.2°F | Resp 18 | Wt 117.5 lb

## 2021-02-17 DIAGNOSIS — Z5112 Encounter for antineoplastic immunotherapy: Secondary | ICD-10-CM | POA: Diagnosis not present

## 2021-02-17 DIAGNOSIS — Z170 Estrogen receptor positive status [ER+]: Secondary | ICD-10-CM | POA: Insufficient documentation

## 2021-02-17 DIAGNOSIS — C50911 Malignant neoplasm of unspecified site of right female breast: Secondary | ICD-10-CM

## 2021-02-17 DIAGNOSIS — Z95828 Presence of other vascular implants and grafts: Secondary | ICD-10-CM

## 2021-02-17 DIAGNOSIS — C50011 Malignant neoplasm of nipple and areola, right female breast: Secondary | ICD-10-CM | POA: Insufficient documentation

## 2021-02-17 LAB — CBC WITH DIFFERENTIAL (CANCER CENTER ONLY)
Abs Immature Granulocytes: 0.01 10*3/uL (ref 0.00–0.07)
Basophils Absolute: 0 10*3/uL (ref 0.0–0.1)
Basophils Relative: 1 %
Eosinophils Absolute: 0.1 10*3/uL (ref 0.0–0.5)
Eosinophils Relative: 3 %
HCT: 36.4 % (ref 36.0–46.0)
Hemoglobin: 11.7 g/dL — ABNORMAL LOW (ref 12.0–15.0)
Immature Granulocytes: 0 %
Lymphocytes Relative: 13 %
Lymphs Abs: 0.5 10*3/uL — ABNORMAL LOW (ref 0.7–4.0)
MCH: 26.8 pg (ref 26.0–34.0)
MCHC: 32.1 g/dL (ref 30.0–36.0)
MCV: 83.5 fL (ref 80.0–100.0)
Monocytes Absolute: 0.5 10*3/uL (ref 0.1–1.0)
Monocytes Relative: 12 %
Neutro Abs: 2.9 10*3/uL (ref 1.7–7.7)
Neutrophils Relative %: 71 %
Platelet Count: 116 10*3/uL — ABNORMAL LOW (ref 150–400)
RBC: 4.36 MIL/uL (ref 3.87–5.11)
RDW: 18.3 % — ABNORMAL HIGH (ref 11.5–15.5)
WBC Count: 4 10*3/uL (ref 4.0–10.5)
nRBC: 0 % (ref 0.0–0.2)

## 2021-02-17 LAB — CMP (CANCER CENTER ONLY)
ALT: 11 U/L (ref 0–44)
AST: 19 U/L (ref 15–41)
Albumin: 3.7 g/dL (ref 3.5–5.0)
Alkaline Phosphatase: 64 U/L (ref 38–126)
Anion gap: 6 (ref 5–15)
BUN: 18 mg/dL (ref 8–23)
CO2: 28 mmol/L (ref 22–32)
Calcium: 8.9 mg/dL (ref 8.9–10.3)
Chloride: 105 mmol/L (ref 98–111)
Creatinine: 0.74 mg/dL (ref 0.44–1.00)
GFR, Estimated: >60 mL/min (ref >60)
Glucose, Bld: 83 mg/dL (ref 70–99)
Potassium: 3.9 mmol/L (ref 3.5–5.1)
Sodium: 139 mmol/L (ref 135–145)
Total Bilirubin: 0.6 mg/dL (ref 0.3–1.2)
Total Protein: 7.4 g/dL (ref 6.5–8.1)

## 2021-02-17 MED ORDER — ACETAMINOPHEN 325 MG PO TABS
650.0000 mg | ORAL_TABLET | Freq: Once | ORAL | Status: AC
  Administered 2021-02-17: 650 mg via ORAL
  Filled 2021-02-17: qty 2

## 2021-02-17 MED ORDER — DIPHENHYDRAMINE HCL 25 MG PO CAPS
25.0000 mg | ORAL_CAPSULE | Freq: Once | ORAL | Status: AC
  Administered 2021-02-17: 25 mg via ORAL
  Filled 2021-02-17: qty 1

## 2021-02-17 MED ORDER — HEPARIN SOD (PORK) LOCK FLUSH 100 UNIT/ML IV SOLN
500.0000 [IU] | Freq: Once | INTRAVENOUS | Status: AC | PRN
  Administered 2021-02-17: 500 [IU]

## 2021-02-17 MED ORDER — SODIUM CHLORIDE 0.9% FLUSH
10.0000 mL | INTRAVENOUS | Status: DC | PRN
  Administered 2021-02-17: 10 mL

## 2021-02-17 MED ORDER — SODIUM CHLORIDE 0.9% FLUSH
10.0000 mL | Freq: Once | INTRAVENOUS | Status: AC
  Administered 2021-02-17: 10 mL

## 2021-02-17 MED ORDER — SODIUM CHLORIDE 0.9 % IV SOLN
3.6000 mg/kg | Freq: Once | INTRAVENOUS | Status: AC
  Administered 2021-02-17: 200 mg via INTRAVENOUS
  Filled 2021-02-17: qty 10

## 2021-02-17 MED ORDER — SODIUM CHLORIDE 0.9 % IV SOLN: Freq: Once | INTRAVENOUS | Status: AC

## 2021-02-17 NOTE — Patient Instructions (Signed)
[facility]  Discharge Instructions: °Thank you for choosing [facility] to provide your oncology and hematology care.  ° °If you have a lab appointment with the Cancer Center, please go directly to the Cancer Center and check in at the registration area. °  °Wear comfortable clothing and clothing appropriate for easy access to any Portacath or PICC line.  ° °We strive to give you quality time with your provider. You may need to reschedule your appointment if you arrive late (15 or more minutes).  Arriving late affects you and other patients whose appointments are after yours.  Also, if you miss three or more appointments without notifying the office, you may be dismissed from the clinic at the provider’s discretion.    °  °For prescription refill requests, have your pharmacy contact our office and allow 72 hours for refills to be completed.   ° °Today you received the following chemotherapy and/or immunotherapy agents Kadcyla    °  °To help prevent nausea and vomiting after your treatment, we encourage you to take your nausea medication as directed. ° °BELOW ARE SYMPTOMS THAT SHOULD BE REPORTED IMMEDIATELY: °*FEVER GREATER THAN 100.4 F (38 °C) OR HIGHER °*CHILLS OR SWEATING °*NAUSEA AND VOMITING THAT IS NOT CONTROLLED WITH YOUR NAUSEA MEDICATION °*UNUSUAL SHORTNESS OF BREATH °*UNUSUAL BRUISING OR BLEEDING °*URINARY PROBLEMS (pain or burning when urinating, or frequent urination) °*BOWEL PROBLEMS (unusual diarrhea, constipation, pain near the anus) °TENDERNESS IN MOUTH AND THROAT WITH OR WITHOUT PRESENCE OF ULCERS (sore throat, sores in mouth, or a toothache) °UNUSUAL RASH, SWELLING OR PAIN  °UNUSUAL VAGINAL DISCHARGE OR ITCHING  ° °Items with * indicate a potential emergency and should be followed up as soon as possible or go to the Emergency Department if any problems should occur. ° °Please show the CHEMOTHERAPY ALERT CARD or IMMUNOTHERAPY ALERT CARD at check-in to the  Emergency Department and triage nurse. ° °Should you have questions after your visit or need to cancel or reschedule your appointment, please contact [facility]  Dept: [phone]  and follow the prompts.  Office hours are 8:00 a.m. to 4:30 p.m. Monday - Friday. Please note that voicemails left after 4:00 p.m. may not be returned until the following business day.  We are closed weekends and major holidays. You have access to a nurse at all times for urgent questions. Please call the main number to the clinic Dept: [phone] and follow the prompts. ° ° °For any non-urgent questions, you may also contact your provider using MyChart. We now offer e-Visits for anyone 18 and older to request care online for non-urgent symptoms. For details visit [email or web address]. °  °Also download the MyChart app! Go to the app store, search "MyChart", open the app, select [facility], and log in with your MyChart username and password. ° °Due to Covid, a mask is required upon entering the hospital/clinic. If you do not have a mask, one will be given to you upon arrival. For doctor visits, patients may have 1 support person aged 18 or older with them. For treatment visits, patients cannot have anyone with them due to current Covid guidelines and our immunocompromised population.  ° °

## 2021-03-01 ENCOUNTER — Encounter: Payer: Self-pay | Admitting: Nutrition

## 2021-03-01 ENCOUNTER — Telehealth: Payer: Self-pay | Admitting: *Deleted

## 2021-03-01 MED ORDER — DEXAMETHASONE 1 MG PO TABS: 1.0000 mg | ORAL_TABLET | Freq: Every day | ORAL | 0 refills | Status: DC

## 2021-03-01 NOTE — Telephone Encounter (Signed)
Received call from pt daughter stating pt is experiencing a decrease in appetite x several weeks.  States they are not able to afford ensure and requesting advice from pt.  Pt has been established with our nutritionist in the past, RN sent scheduling message to for pt to f/u with nutritionist. Verbal orders received by MD for pt to be prescribed dexamethasone 1 mg p.o daily x 1 month to help increase appetite.  Pt daughter educated and verbalized understanding.  Prescription sent to pharmacy on file.

## 2021-03-01 NOTE — Progress Notes (Signed)
Provided one complimentary case of ensure plus high protein. 

## 2021-03-08 ENCOUNTER — Encounter: Payer: Self-pay | Admitting: Hematology and Oncology

## 2021-03-08 NOTE — Progress Notes (Signed)
Patient Care Team: Pcp, No as PCP - General [name], MD as PCP - Cardiology (Cardiology) Mauro Kaufmann, RN as Oncology Nurse Navigator Rockwell Germany, RN as Oncology Nurse Navigator Erroll Luna, MD as Consulting Physician (General Surgery) Nicholas Lose, MD as Consulting Physician (Hematology and Oncology)  DIAGNOSIS:    ICD-10-CM   1. Malignant neoplasm involving both nipple and areola of right breast in female, estrogen receptor positive ([place])  C50.011    Z17.0       SUMMARY OF ONCOLOGIC HISTORY: Oncology History  Malignant neoplasm of right breast in female, estrogen receptor positive ([place])  04/18/2020 Initial Diagnosis   Patient presented to the ED with complaint of syncope and found to have a fungating right breast mass. CT showed a right breast mass, 8.1cm, and right axillary adenopathy. Patient reported a right breast mass present for past 2-3 years. Biopsy showed IDC, grade 3, HER-2 equivocal by IHC, positive by FISH (ratio 3.56), ER+ 95%, PR+ 70%, ,Ki67 25%.    04/18/2020 Cancer Staging   Staging form: Breast, AJCC 8th Edition - Clinical stage from 04/18/2020: Stage IIIB (cT4b, cN1, cM0, G3, ER+, PR+, HER2+) - Signed by Gardenia Phlegm, NP on 04/27/2020 Stage prefix: Initial diagnosis Histologic grading system: 3 grade system    05/13/2020 - 08/29/2020 Chemotherapy          10/11/2020 -  Chemotherapy   Patient is on Treatment Plan : BREAST ADO-Trastuzumab Emtansine (Kadcyla) q21d       CHIEF COMPLIANT: Kadcyla cycle 8 maintenance  INTERVAL HISTORY: Elizabeth Chase is a 81 y.o. with above-mentioned history of HER-2 positive right breast cancer currently on chemotherapy with Kadcyla. She presents to the clinic today for treatment.  She is tolerating Kadcyla extremely well without any problems or concerns.  Denies any nausea vomiting.  Neuropathy is relatively stable.  ALLERGIES:  is allergic to bee venom.  MEDICATIONS:  Current Outpatient  Medications  Medication Sig Dispense Refill   albuterol (VENTOLIN HFA) 108 (90 Base) MCG/ACT inhaler TAKE 2 PUFFS BY MOUTH EVERY 6 HOURS AS NEEDED FOR WHEEZE OR SHORTNESS OF BREATH 8.5 each 2   atorvastatin (LIPITOR) 20 MG tablet Take 1 tablet (20 mg total) by mouth daily at 6 PM. (Patient taking differently: Take 20 mg by mouth in the morning.) 30 tablet 2   cholecalciferol (VITAMIN D) 25 MCG (1000 UNIT) tablet Take 1,000 Units by mouth in the morning.     dexamethasone (DECADRON) 1 MG tablet Take 1 tablet (1 mg total) by mouth daily with breakfast. 30 tablet 0   feeding supplement (BOOST HIGH PROTEIN) LIQD Take 237 mLs by mouth 3 (three) times daily between meals. Please give her 90 bottles (Patient taking differently: Take 0.5 Containers by mouth in the morning and at bedtime. Please give her 90 bottles) 237 mL 6   loratadine (CLARITIN) 10 MG tablet Take 10 mg by mouth See admin instructions. Take 1 tablet (10 mg) by mouth for 4 days after chemotherapy     trazodone (DESYREL) 300 MG tablet TAKE 1 TABLET BY MOUTH EVERYDAY AT BEDTIME 90 tablet 0   No current facility-administered medications for this visit.    PHYSICAL EXAMINATION: ECOG PERFORMANCE STATUS: 1 - Symptomatic but completely ambulatory  Vitals:   03/09/21 0926  BP: (!) 157/63  Pulse: 87  Resp: 18  Temp: 97.8 F (36.6 C)  SpO2: 92%   Filed Weights   03/09/21 0926  Weight: 121 lb 6.4 oz (55.1 kg)  LABORATORY DATA:  I have reviewed the data as listed CMP Latest Ref Rng & Units 02/17/2021 01/25/2021 01/06/2021  Glucose 70 - 99 mg/dL 83 82 98  BUN 8 - 23 mg/dL _0 Creatinine 0.44 - 1.00 mg/dL 0.74 0.78 0.79  Sodium 135 - 145 mmol/L 139 141 141  Potassium 3.5 - 5.1 mmol/L 3.9 3.7 3.8  Chloride 98 - 111 mmol/L 105 107 107  CO2 22 - 32 mmol/L _1 Calcium 8.9 - 10.3 mg/dL 8.9 8.7(L) 8.8(L)  Total Protein 6.5 - 8.1 g/dL 7.4 7.4 7.1  Total Bilirubin 0.3 - 1.2 mg/dL 0.6 0.4 0.5  Alkaline Phos 38 - 126 U/L 64  65 62  AST 15 - 41 U/L _2 ALT 0 - 44 U/L _3 Lab Results  Component Value Date   WBC 4.9 03/09/2021   HGB 11.0 (L) 03/09/2021   HCT 34.6 (L) 03/09/2021   MCV 85.0 03/09/2021   PLT 128 (L) 03/09/2021   NEUTROABS 3.5 03/09/2021    ASSESSMENT & PLAN:  Malignant neoplasm of right breast in female, estrogen receptor positive ([place]) Stage IIIc fungating tumor of the right breast: ER/PR and HER-2 positive 04/15/20: CT CAP: No distant mets   Treatment Plan: 1. Neoadj chemo with Taxotere Herceptin Perjeta x6 cycles completed 08/26/2020 followed by [facility] Drone maintenance 2. 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 Adj XRT completed 02/10/2021 4. Adj Anti estrogen therapy -------------------------------------------------------------------------------------------------- Current treatment: Kadcyla maintenance started 10/11/2020, today cycle 8 Kadcyla toxicities: Denies any adverse effects to Kadcyla Chemotherapy-induced anemia: Monitoring today's hemoglobin is 11  Monitoring for neuropathy as well: Patient has mild neuropathy in the right hand.  This is stable    Return to clinic every 3 weeks for Kadcyla and every 6 weeks with follow-up with me.    No orders of the defined types were placed in this encounter.  The patient has a good understanding of the overall plan. she agrees with it. she will call with any problems that may develop before the next visit here.  Total time spent: 30 mins including face to face time and time spent for planning, charting and coordination of care  Rulon Eisenmenger, MD, MPH 03/09/2021  I, Thana Ates, am acting as scribe for Dr. Nicholas Lose.  I have reviewed the above documentation for accuracy and completeness, and I agree with  the above.

## 2021-03-08 NOTE — Progress Notes (Signed)
° °                                                                                                                                                          °  Patient Name: Elizabeth Chase MRN: [id number] DOB: 1941/01/12 Referring Physician: Nicholas Lose (Profile Not Attached) Date of Service: 02/10/2021 [facility], [place]                                                        End Of Treatment Note  Diagnoses: C50.111-Malignant neoplasm of central portion of right female breast C50.911-Malignant neoplasm of unspecified site of right female breast  Cancer Staging:  Cancer Staging  Malignant neoplasm of right breast in female, estrogen receptor positive ([place]) Staging form: Breast, AJCC 8th Edition - Clinical stage from 04/18/2020: Stage IIIB (cT4b, cN1, cM0, G3, ER+, PR+, HER2+) - Signed by Gardenia Phlegm, NP on 04/27/2020 Stage prefix: Initial diagnosis Histologic grading system: 3 grade system  Intent: Curative  Radiation Treatment Dates: 12/27/2020 through 02/10/2021 Site Technique Total Dose (Gy) Dose per Fx (Gy) Completed Fx Beam Energies  Chest Wall, Right: CW_Rt_IMN 3D 50/50 2 25/25 6XFFF  Chest Wall, Right: CW_Rt_PAB_SCV 3D 50/50 2 25/25 6X, 10X  Chest Wall, Right: CW_Rt_Bst Electron 10/10 2 5/5 6E   Narrative: The patient tolerated radiation therapy relatively well.   Plan: The patient will follow-up with radiation oncology in 17mo. -----------------------------------  SEppie Gibson MD

## 2021-03-08 NOTE — Assessment & Plan Note (Signed)
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 Neoadj chemo withTaxotere Herceptin Perjetax6 cycles completed 08/26/2020 followed by Steward Drone maintenance 2. Mastectomywith sentinel node biopsy: 09/20/2020: Residual invasive ductal carcinoma 7.4 cm invades dermis and skin and skeletal muscle, 4/14 lymph nodes positive, margins negative, lymphovascular invasion present, ER 95%, PR 20%, HER2 positive, Ki-67 25% 3. Adj XRT completed 02/10/2021 4. Adj Anti estrogen therapy -------------------------------------------------------------------------------------------------- Current treatment: Kadcyla maintenance started 10/11/2020, today cycle 8 Kadcyla toxicities: Denies any adverse effects to Kadcyla Chemotherapy-induced anemia: Monitoring today's hemoglobin is 11.7 Monitoring for neuropathy as well: Patient has mild neuropathy in the right hand.  Radiation dermatitis causing itching Return to clinic every 3 weeks for Kadcyla and every 6 weeks with follow-up with me.

## 2021-03-09 ENCOUNTER — Inpatient Hospital Stay: Payer: Medicare HMO | Admitting: Nutrition

## 2021-03-09 ENCOUNTER — Inpatient Hospital Stay: Payer: Medicare HMO

## 2021-03-09 ENCOUNTER — Inpatient Hospital Stay (HOSPITAL_BASED_OUTPATIENT_CLINIC_OR_DEPARTMENT_OTHER): Payer: Medicare HMO | Admitting: Hematology and Oncology

## 2021-03-09 ENCOUNTER — Other Ambulatory Visit: Payer: Self-pay | Admitting: *Deleted

## 2021-03-09 ENCOUNTER — Other Ambulatory Visit: Payer: Self-pay

## 2021-03-09 VITALS — BP 140/69 | HR 72 | Temp 97.9°F | Resp 18

## 2021-03-09 DIAGNOSIS — C50011 Malignant neoplasm of nipple and areola, right female breast: Secondary | ICD-10-CM | POA: Diagnosis not present

## 2021-03-09 DIAGNOSIS — C50911 Malignant neoplasm of unspecified site of right female breast: Secondary | ICD-10-CM

## 2021-03-09 DIAGNOSIS — Z95828 Presence of other vascular implants and grafts: Secondary | ICD-10-CM

## 2021-03-09 DIAGNOSIS — Z170 Estrogen receptor positive status [ER+]: Secondary | ICD-10-CM | POA: Diagnosis not present

## 2021-03-09 DIAGNOSIS — Z5112 Encounter for antineoplastic immunotherapy: Secondary | ICD-10-CM | POA: Diagnosis not present

## 2021-03-09 DIAGNOSIS — Z5181 Encounter for therapeutic drug level monitoring: Secondary | ICD-10-CM

## 2021-03-09 DIAGNOSIS — Z79899 Other long term (current) drug therapy: Secondary | ICD-10-CM

## 2021-03-09 LAB — CBC WITH DIFFERENTIAL (CANCER CENTER ONLY)
Abs Immature Granulocytes: 0.01 10*3/uL (ref 0.00–0.07)
Basophils Absolute: 0 10*3/uL (ref 0.0–0.1)
Basophils Relative: 1 %
Eosinophils Absolute: 0.1 10*3/uL (ref 0.0–0.5)
Eosinophils Relative: 3 %
HCT: 34.6 % — ABNORMAL LOW (ref 36.0–46.0)
Hemoglobin: 11 g/dL — ABNORMAL LOW (ref 12.0–15.0)
Immature Granulocytes: 0 %
Lymphocytes Relative: 15 %
Lymphs Abs: 0.7 10*3/uL (ref 0.7–4.0)
MCH: 27 pg (ref 26.0–34.0)
MCHC: 31.8 g/dL (ref 30.0–36.0)
MCV: 85 fL (ref 80.0–100.0)
Monocytes Absolute: 0.5 10*3/uL (ref 0.1–1.0)
Monocytes Relative: 10 %
Neutro Abs: 3.5 10*3/uL (ref 1.7–7.7)
Neutrophils Relative %: 71 %
Platelet Count: 128 10*3/uL — ABNORMAL LOW (ref 150–400)
RBC: 4.07 MIL/uL (ref 3.87–5.11)
RDW: 16.8 % — ABNORMAL HIGH (ref 11.5–15.5)
WBC Count: 4.9 10*3/uL (ref 4.0–10.5)
nRBC: 0 % (ref 0.0–0.2)

## 2021-03-09 LAB — CMP (CANCER CENTER ONLY)
ALT: 10 U/L (ref 0–44)
AST: 16 U/L (ref 15–41)
Albumin: 3.5 g/dL (ref 3.5–5.0)
Alkaline Phosphatase: 54 U/L (ref 38–126)
Anion gap: 4 — ABNORMAL LOW (ref 5–15)
BUN: 21 mg/dL (ref 8–23)
CO2: 30 mmol/L (ref 22–32)
Calcium: 8.7 mg/dL — ABNORMAL LOW (ref 8.9–10.3)
Chloride: 106 mmol/L (ref 98–111)
Creatinine: 0.64 mg/dL (ref 0.44–1.00)
GFR, Estimated: >60 mL/min (ref >60)
Glucose, Bld: 134 mg/dL — ABNORMAL HIGH (ref 70–99)
Potassium: 3.3 mmol/L — ABNORMAL LOW (ref 3.5–5.1)
Sodium: 140 mmol/L (ref 135–145)
Total Bilirubin: 0.3 mg/dL (ref 0.3–1.2)
Total Protein: 7 g/dL (ref 6.5–8.1)

## 2021-03-09 MED ORDER — SODIUM CHLORIDE 0.9% FLUSH
10.0000 mL | INTRAVENOUS | Status: DC | PRN
  Administered 2021-03-09: 10 mL

## 2021-03-09 MED ORDER — ACETAMINOPHEN 325 MG PO TABS
650.0000 mg | ORAL_TABLET | Freq: Once | ORAL | Status: AC
  Administered 2021-03-09: 650 mg via ORAL
  Filled 2021-03-09: qty 2

## 2021-03-09 MED ORDER — SODIUM CHLORIDE 0.9% FLUSH
10.0000 mL | Freq: Once | INTRAVENOUS | Status: AC
  Administered 2021-03-09: 10 mL

## 2021-03-09 MED ORDER — SODIUM CHLORIDE 0.9 % IV SOLN: Freq: Once | INTRAVENOUS | Status: AC

## 2021-03-09 MED ORDER — SODIUM CHLORIDE 0.9 % IV SOLN
3.6000 mg/kg | Freq: Once | INTRAVENOUS | Status: AC
  Administered 2021-03-09: 200 mg via INTRAVENOUS
  Filled 2021-03-09: qty 10

## 2021-03-09 MED ORDER — DEXAMETHASONE 0.5 MG PO TABS: 0.5000 mg | ORAL_TABLET | Freq: Every day | ORAL | 1 refills | Status: DC

## 2021-03-09 MED ORDER — DIPHENHYDRAMINE HCL 25 MG PO CAPS
25.0000 mg | ORAL_CAPSULE | Freq: Once | ORAL | Status: AC
  Administered 2021-03-09: 25 mg via ORAL
  Filled 2021-03-09: qty 1

## 2021-03-09 MED ORDER — HEPARIN SOD (PORK) LOCK FLUSH 100 UNIT/ML IV SOLN
500.0000 [IU] | Freq: Once | INTRAVENOUS | Status: AC | PRN
  Administered 2021-03-09: 500 [IU]

## 2021-03-09 MED ORDER — ALBUTEROL SULFATE (2.5 MG/3ML) 0.083% IN NEBU: 2.5000 mg | INHALATION_SOLUTION | Freq: Four times a day (QID) | RESPIRATORY_TRACT | 12 refills | Status: DC | PRN

## 2021-03-09 NOTE — Patient Instructions (Signed)
[facility]  Discharge Instructions: °Thank you for choosing [facility] to provide your oncology and hematology care.  ° °If you have a lab appointment with the Cancer Center, please go directly to the Cancer Center and check in at the registration area. °  °Wear comfortable clothing and clothing appropriate for easy access to any Portacath or PICC line.  ° °We strive to give you quality time with your provider. You may need to reschedule your appointment if you arrive late (15 or more minutes).  Arriving late affects you and other patients whose appointments are after yours.  Also, if you miss three or more appointments without notifying the office, you may be dismissed from the clinic at the provider’s discretion.    °  °For prescription refill requests, have your pharmacy contact our office and allow 72 hours for refills to be completed.   ° °Today you received the following chemotherapy and/or immunotherapy agents Kadcyla    °  °To help prevent nausea and vomiting after your treatment, we encourage you to take your nausea medication as directed. ° °BELOW ARE SYMPTOMS THAT SHOULD BE REPORTED IMMEDIATELY: °*FEVER GREATER THAN 100.4 F (38 °C) OR HIGHER °*CHILLS OR SWEATING °*NAUSEA AND VOMITING THAT IS NOT CONTROLLED WITH YOUR NAUSEA MEDICATION °*UNUSUAL SHORTNESS OF BREATH °*UNUSUAL BRUISING OR BLEEDING °*URINARY PROBLEMS (pain or burning when urinating, or frequent urination) °*BOWEL PROBLEMS (unusual diarrhea, constipation, pain near the anus) °TENDERNESS IN MOUTH AND THROAT WITH OR WITHOUT PRESENCE OF ULCERS (sore throat, sores in mouth, or a toothache) °UNUSUAL RASH, SWELLING OR PAIN  °UNUSUAL VAGINAL DISCHARGE OR ITCHING  ° °Items with * indicate a potential emergency and should be followed up as soon as possible or go to the Emergency Department if any problems should occur. ° °Please show the CHEMOTHERAPY ALERT CARD or IMMUNOTHERAPY ALERT CARD at check-in to the  Emergency Department and triage nurse. ° °Should you have questions after your visit or need to cancel or reschedule your appointment, please contact [facility]  Dept: [phone]  and follow the prompts.  Office hours are 8:00 a.m. to 4:30 p.m. Monday - Friday. Please note that voicemails left after 4:00 p.m. may not be returned until the following business day.  We are closed weekends and major holidays. You have access to a nurse at all times for urgent questions. Please call the main number to the clinic Dept: [phone] and follow the prompts. ° ° °For any non-urgent questions, you may also contact your provider using MyChart. We now offer e-Visits for anyone 18 and older to request care online for non-urgent symptoms. For details visit [email or web address]. °  °Also download the MyChart app! Go to the app store, search "MyChart", open the app, select [facility], and log in with your MyChart username and password. ° °Due to Covid, a mask is required upon entering the hospital/clinic. If you do not have a mask, one will be given to you upon arrival. For doctor visits, patients may have 1 support person aged 18 or older with them. For treatment visits, patients cannot have anyone with them due to current Covid guidelines and our immunocompromised population.  ° °

## 2021-03-09 NOTE — Progress Notes (Signed)
Nutrition follow-up completed with patient during infusion.  Patient is receiving Kadcyla for right breast cancer.  Weight has improved and was documented as 121.4 pounds on January 26. Patient reports she is drinking 3 cartons of Ensure high-calorie high-protein daily. Denies nausea, vomiting, constipation, and diarrhea. Reports some indigestion.  She takes Pepto-Bismol for this.  Nutrition diagnosis: Unintended weight loss improved.  Intervention: Continue high-calorie, high-protein foods. Continue Ensure Plus or equivalent as needed 3 times daily between meals. Provided nutrition education on managing indigestion. Questions answered.  Monitoring, evaluation, goals: Patient will tolerate adequate calories and protein to minimize weight loss.  Next visit: Thursday, March 30, during infusion.  **Disclaimer: This note was dictated with voice recognition software. Similar sounding words can inadvertently be transcribed and this note may contain transcription errors which may not have been corrected upon publication of note.**

## 2021-03-09 NOTE — Progress Notes (Signed)
Per Dr. Lindi Adie, [place] to trt today w/ ECHO from 12/06/20 EF 55-60%.

## 2021-03-14 NOTE — Progress Notes (Incomplete)
Elizabeth Chase presents today for follow-up after completing radiation to her right breast on 02/10/2021  Pain: Skin:  ROM:  Lymphedema:  MedOnc F/U: Last saw Dr. Lindi Adie on 03/09/2021 and will F/U up with him again on 04/20/2021 before her 10th cycle of Kadcyla; will discuss starting antiestrogen medication at that visit Other issues of note:   Anti-estrogen medication TBD: Pt reports Yes No Comments  Tamoxifen []  []    Letrozole []  []    Anastrazole []  []    Mammogram [x]  Date: TBD [] 

## 2021-03-15 ENCOUNTER — Other Ambulatory Visit: Payer: Self-pay

## 2021-03-15 ENCOUNTER — Ambulatory Visit
Admission: RE | Admit: 2021-03-15 | Discharge: 2021-03-15 | Disposition: A | Discharge status: Home / Self Care | Payer: Medicare HMO | Source: Ambulatory Visit | Attending: Radiation Oncology | Admitting: Radiation Oncology

## 2021-03-15 ENCOUNTER — Encounter: Payer: Self-pay | Admitting: Radiation Oncology

## 2021-03-15 VITALS — BP 149/82 | HR 81 | Temp 96.0°F | Resp 18 | Ht 65.0 in | Wt 121.2 lb

## 2021-03-15 DIAGNOSIS — C50911 Malignant neoplasm of unspecified site of right female breast: Secondary | ICD-10-CM | POA: Diagnosis not present

## 2021-03-15 DIAGNOSIS — Z79899 Other long term (current) drug therapy: Secondary | ICD-10-CM | POA: Insufficient documentation

## 2021-03-15 DIAGNOSIS — Z170 Estrogen receptor positive status [ER+]: Secondary | ICD-10-CM | POA: Diagnosis not present

## 2021-03-15 DIAGNOSIS — Z7901 Long term (current) use of anticoagulants: Secondary | ICD-10-CM | POA: Diagnosis not present

## 2021-03-15 DIAGNOSIS — Z923 Personal history of irradiation: Secondary | ICD-10-CM | POA: Insufficient documentation

## 2021-03-15 NOTE — Progress Notes (Addendum)
Elizabeth Chase presents today for follow-up after completing radiation to her right breast on 02/10/2021  Pain:None Skin: No issues ROM:  No issues  Lymphedema: No\ne MedOnc F/U: Last saw Dr. Lindi Adie on 03/09/2021 and will F/U up with him again on 04/20/2021 before her 10th cycle of Kadcyla; will discuss starting antiestrogen medication at that visit Other issues of note:   Anti-estrogen medication TBD: Pt reports Yes No Comments  Tamoxifen []  [x]    Letrozole []  [x]    Anastrazole []  [x]    Mammogram [x]  Date: TBD []  Do not have one scheduled   Vitals:   03/15/21 1139  BP: (!) 149/82  Pulse: 81  Resp: 18  Temp: (!) 96 F (35.6 C)  TempSrc: Temporal  SpO2: 94%  Weight: 55 kg  Height: 5\' 5"  (1.651 m)

## 2021-03-15 NOTE — Progress Notes (Signed)
Radiation Oncology         [phone]) [phone] ________________________________  Name: Elizabeth Chase MRN: [id number]  Date: 03/15/2021  DOB: Jan 04, 1941  Follow-Up Visit Note  Outpatient  CC: Pcp, No  Nicholas Lose, MD  Diagnosis and Prior Radiotherapy:    ICD-10-CM   1. Malignant neoplasm of right breast in female, estrogen receptor positive, unspecified site of breast [facility])  C50.911    Z17.0       Radiation Treatment Dates: 12/27/2020 through 02/10/2021 Site Technique Total Dose (Gy) Dose per Fx (Gy) Completed Fx Beam Energies  Chest Wall, Right: CW_Rt_IMN 3D 50/50 2 25/25 6XFFF  Chest Wall, Right: CW_Rt_PAB_SCV 3D 50/50 2 25/25 6X, 10X  Chest Wall, Right: CW_Rt_Bst Electron 10/10 2 5/5 6E    CHIEF COMPLAINT: Here for follow-up and surveillance of breast cancer  Narrative:  The patient returns today for routine follow-up.  Elizabeth Chase presents today for follow-up after completing radiation to her right breast on 02/10/2021  Pain:None Skin: No issues ROM:  No issues  Lymphedema: No\ne MedOnc F/U: Last saw Dr. Lindi Adie on 03/09/2021 and will F/U up with him again on 04/20/2021 before her 10th cycle of Kadcyla; will discuss starting antiestrogen medication at that visit Other issues of note:   Anti-estrogen medication TBD: Pt reports Yes No Comments  Tamoxifen []  [x]    Letrozole []  [x]    Anastrazole []  [x]    Mammogram [x]  Date: TBD []  Do not have one scheduled   Vitals:   03/15/21 1139  BP: (!) 149/82  Pulse: 81  Resp: 18  Temp: (!) 96 F (35.6 C)  TempSrc: Temporal  SpO2: 94%  Weight: 121 lb 4 oz (55 kg)  Height: 5\' 5"  (1.651 m)                                 ALLERGIES:  is allergic to bee venom.  Meds: Current Outpatient Medications  Medication Sig Dispense Refill   albuterol (PROVENTIL) (2.5 MG/3ML) 0.083% nebulizer solution Take 3 mLs (2.5 mg total) by nebulization every 6 (six) hours as needed for wheezing or shortness of breath. 75 mL 12   atorvastatin  (LIPITOR) 20 MG tablet Take 1 tablet (20 mg total) by mouth daily at 6 PM. (Patient taking differently: Take 20 mg by mouth in the morning.) 30 tablet 2   cholecalciferol (VITAMIN D) 25 MCG (1000 UNIT) tablet Take 1,000 Units by mouth in the morning.     dexamethasone (DECADRON) 0.5 MG tablet Take 1 tablet (0.5 mg total) by mouth daily with breakfast. 30 tablet 1   feeding supplement (BOOST HIGH PROTEIN) LIQD Take 237 mLs by mouth 3 (three) times daily between meals. Please give her 90 bottles (Patient taking differently: Take 0.5 Containers by mouth in the morning and at bedtime. Please give her 90 bottles) 237 mL 6   trazodone (DESYREL) 300 MG tablet TAKE 1 TABLET BY MOUTH EVERYDAY AT BEDTIME 90 tablet 0   loratadine (CLARITIN) 10 MG tablet Take 10 mg by mouth See admin instructions. Take 1 tablet (10 mg) by mouth for 4 days after chemotherapy (Patient not taking: Reported on 03/15/2021)     No current facility-administered medications for this encounter.    Physical Findings: The patient is in no acute distress. Patient is alert and oriented.  height is 5\' 5"  (1.651 m) and weight is 121 lb 4 oz (55 kg). Her temporal temperature is 96 F (35.6  C) (abnormal). Her blood pressure is 149/82 (abnormal) and her pulse is 81. Her respiration is 18 and oxygen saturation is 94%. .    Satisfactory skin healing in radiotherapy fields. No palpable or visual sign of recurrence of right chest wall   Lab Findings: Lab Results  Component Value Date   WBC 4.9 03/09/2021   HGB 11.0 (L) 03/09/2021   HCT 34.6 (L) 03/09/2021   MCV 85.0 03/09/2021   PLT 128 (L) 03/09/2021    Radiographic Findings: No results found.  Impression/Plan: Healing well from radiotherapy to the breast tissue.  Continue skin care with topical Vitamin E Oil and / or lotion for at least 2 more months for further healing.  I encouraged her to continue with yearly mammography as appropriate (for intact breast tissue) and followup with  medical oncology. She will discuss timing of mammogram for left breast w/ Dr Lindi Adie. I will see her back on an as-needed basis. I have encouraged her to call if she has any issues or concerns in the future. I wished her the very best.  On date of service, in total, I spent 20 minutes on this encounter. Patient was seen in person.  _____________________________________   Eppie Gibson, MD

## 2021-03-16 ENCOUNTER — Other Ambulatory Visit: Payer: Self-pay | Admitting: *Deleted

## 2021-03-16 ENCOUNTER — Encounter: Payer: Self-pay | Admitting: *Deleted

## 2021-03-16 NOTE — Progress Notes (Signed)
RN sucessfully faxed order for nebulizer machine to [facility] [phone]).

## 2021-03-17 ENCOUNTER — Ambulatory Visit: Payer: Self-pay | Admitting: Radiation Oncology

## 2021-03-20 ENCOUNTER — Ambulatory Visit (HOSPITAL_COMMUNITY)
Admission: RE | Admit: 2021-03-20 | Discharge: 2021-03-20 | Disposition: A | Discharge status: Home / Self Care | Payer: Medicare HMO | Source: Ambulatory Visit | Attending: Hematology and Oncology | Admitting: Hematology and Oncology

## 2021-03-20 ENCOUNTER — Other Ambulatory Visit: Payer: Self-pay

## 2021-03-20 DIAGNOSIS — Z5181 Encounter for therapeutic drug level monitoring: Secondary | ICD-10-CM | POA: Diagnosis not present

## 2021-03-20 DIAGNOSIS — Z0189 Encounter for other specified special examinations: Secondary | ICD-10-CM

## 2021-03-20 DIAGNOSIS — I10 Essential (primary) hypertension: Secondary | ICD-10-CM | POA: Insufficient documentation

## 2021-03-20 DIAGNOSIS — Z8673 Personal history of transient ischemic attack (TIA), and cerebral infarction without residual deficits: Secondary | ICD-10-CM | POA: Diagnosis not present

## 2021-03-20 DIAGNOSIS — Z79899 Other long term (current) drug therapy: Secondary | ICD-10-CM | POA: Diagnosis not present

## 2021-03-20 DIAGNOSIS — E785 Hyperlipidemia, unspecified: Secondary | ICD-10-CM | POA: Diagnosis not present

## 2021-03-20 DIAGNOSIS — R0602 Shortness of breath: Secondary | ICD-10-CM | POA: Diagnosis not present

## 2021-03-20 LAB — ECHOCARDIOGRAM COMPLETE
AR max vel: 1.55 cm2
AV Area VTI: 1.47 cm2
AV Area mean vel: 1.53 cm2
AV Mean grad: 7 mmHg
AV Peak grad: 12.8 mmHg
Ao pk vel: 1.79 m/s
Area-P 1/2: 3.89 cm2
MV VTI: 1.78 cm2
S' Lateral: 2.8 cm

## 2021-03-20 NOTE — Progress Notes (Signed)
Echocardiogram 2D Echocardiogram has been performed.  Elizabeth Chase 03/20/2021, 10:06 AM

## 2021-03-29 ENCOUNTER — Telehealth: Payer: Self-pay | Admitting: Hematology and Oncology

## 2021-03-29 ENCOUNTER — Other Ambulatory Visit: Payer: Self-pay | Admitting: *Deleted

## 2021-03-29 DIAGNOSIS — C50011 Malignant neoplasm of nipple and areola, right female breast: Secondary | ICD-10-CM

## 2021-03-29 NOTE — Telephone Encounter (Signed)
R/s per 2/14 inbasket, pt daughter req to r/s, had been sick did not want to expose, pt daughter aware of updated appt

## 2021-03-30 ENCOUNTER — Inpatient Hospital Stay: Payer: Medicare HMO

## 2021-04-06 ENCOUNTER — Other Ambulatory Visit: Payer: Self-pay

## 2021-04-06 ENCOUNTER — Inpatient Hospital Stay: Payer: Medicare HMO

## 2021-04-06 ENCOUNTER — Inpatient Hospital Stay: Payer: Medicare HMO | Attending: Hematology and Oncology

## 2021-04-06 VITALS — BP 160/77 | HR 81 | Temp 97.8°F | Resp 18 | Wt 121.8 lb

## 2021-04-06 DIAGNOSIS — Z5112 Encounter for antineoplastic immunotherapy: Secondary | ICD-10-CM | POA: Insufficient documentation

## 2021-04-06 DIAGNOSIS — C50011 Malignant neoplasm of nipple and areola, right female breast: Secondary | ICD-10-CM | POA: Diagnosis not present

## 2021-04-06 DIAGNOSIS — Z170 Estrogen receptor positive status [ER+]: Secondary | ICD-10-CM | POA: Diagnosis not present

## 2021-04-06 DIAGNOSIS — Z95828 Presence of other vascular implants and grafts: Secondary | ICD-10-CM

## 2021-04-06 DIAGNOSIS — C773 Secondary and unspecified malignant neoplasm of axilla and upper limb lymph nodes: Secondary | ICD-10-CM | POA: Diagnosis not present

## 2021-04-06 DIAGNOSIS — C50911 Malignant neoplasm of unspecified site of right female breast: Secondary | ICD-10-CM

## 2021-04-06 LAB — CMP (CANCER CENTER ONLY)
ALT: 14 U/L (ref 0–44)
AST: 19 U/L (ref 15–41)
Albumin: 3.6 g/dL (ref 3.5–5.0)
Alkaline Phosphatase: 65 U/L (ref 38–126)
Anion gap: 5 (ref 5–15)
BUN: 19 mg/dL (ref 8–23)
CO2: 29 mmol/L (ref 22–32)
Calcium: 9.1 mg/dL (ref 8.9–10.3)
Chloride: 105 mmol/L (ref 98–111)
Creatinine: 0.73 mg/dL (ref 0.44–1.00)
GFR, Estimated: >60 mL/min (ref >60)
Glucose, Bld: 165 mg/dL — ABNORMAL HIGH (ref 70–99)
Potassium: 3.4 mmol/L — ABNORMAL LOW (ref 3.5–5.1)
Sodium: 139 mmol/L (ref 135–145)
Total Bilirubin: 0.5 mg/dL (ref 0.3–1.2)
Total Protein: 7.2 g/dL (ref 6.5–8.1)

## 2021-04-06 LAB — CBC WITH DIFFERENTIAL (CANCER CENTER ONLY)
Abs Immature Granulocytes: 0.01 10*3/uL (ref 0.00–0.07)
Basophils Absolute: 0 10*3/uL (ref 0.0–0.1)
Basophils Relative: 1 %
Eosinophils Absolute: 0.1 10*3/uL (ref 0.0–0.5)
Eosinophils Relative: 3 %
HCT: 36.1 % (ref 36.0–46.0)
Hemoglobin: 11.9 g/dL — ABNORMAL LOW (ref 12.0–15.0)
Immature Granulocytes: 0 %
Lymphocytes Relative: 21 %
Lymphs Abs: 0.8 10*3/uL (ref 0.7–4.0)
MCH: 28.4 pg (ref 26.0–34.0)
MCHC: 33 g/dL (ref 30.0–36.0)
MCV: 86.2 fL (ref 80.0–100.0)
Monocytes Absolute: 0.4 10*3/uL (ref 0.1–1.0)
Monocytes Relative: 10 %
Neutro Abs: 2.6 10*3/uL (ref 1.7–7.7)
Neutrophils Relative %: 65 %
Platelet Count: 118 10*3/uL — ABNORMAL LOW (ref 150–400)
RBC: 4.19 MIL/uL (ref 3.87–5.11)
RDW: 16.4 % — ABNORMAL HIGH (ref 11.5–15.5)
WBC Count: 3.9 10*3/uL — ABNORMAL LOW (ref 4.0–10.5)
nRBC: 0 % (ref 0.0–0.2)

## 2021-04-06 MED ORDER — DIPHENHYDRAMINE HCL 25 MG PO CAPS
25.0000 mg | ORAL_CAPSULE | Freq: Once | ORAL | Status: AC
  Administered 2021-04-06: 25 mg via ORAL
  Filled 2021-04-06: qty 1

## 2021-04-06 MED ORDER — ACETAMINOPHEN 325 MG PO TABS
650.0000 mg | ORAL_TABLET | Freq: Once | ORAL | Status: AC
  Administered 2021-04-06: 650 mg via ORAL
  Filled 2021-04-06: qty 2

## 2021-04-06 MED ORDER — SODIUM CHLORIDE 0.9 % IV SOLN: Freq: Once | INTRAVENOUS | Status: AC

## 2021-04-06 MED ORDER — SODIUM CHLORIDE 0.9% FLUSH
10.0000 mL | INTRAVENOUS | Status: DC | PRN
  Administered 2021-04-06: 10 mL

## 2021-04-06 MED ORDER — SODIUM CHLORIDE 0.9 % IV SOLN
3.6000 mg/kg | Freq: Once | INTRAVENOUS | Status: AC
  Administered 2021-04-06: 200 mg via INTRAVENOUS
  Filled 2021-04-06: qty 10

## 2021-04-06 MED ORDER — SODIUM CHLORIDE 0.9% FLUSH
10.0000 mL | Freq: Once | INTRAVENOUS | Status: AC
  Administered 2021-04-06: 10 mL

## 2021-04-06 MED ORDER — HEPARIN SOD (PORK) LOCK FLUSH 100 UNIT/ML IV SOLN
500.0000 [IU] | Freq: Once | INTRAVENOUS | Status: AC | PRN
  Administered 2021-04-06: 500 [IU]

## 2021-04-06 NOTE — Progress Notes (Signed)
Pt declined to stay for 30 minute post infusion observation.

## 2021-04-06 NOTE — Patient Instructions (Signed)
[facility]  Discharge Instructions: °Thank you for choosing [facility] to provide your oncology and hematology care.  ° °If you have a lab appointment with the Cancer Center, please go directly to the Cancer Center and check in at the registration area. °  °Wear comfortable clothing and clothing appropriate for easy access to any Portacath or PICC line.  ° °We strive to give you quality time with your provider. You may need to reschedule your appointment if you arrive late (15 or more minutes).  Arriving late affects you and other patients whose appointments are after yours.  Also, if you miss three or more appointments without notifying the office, you may be dismissed from the clinic at the provider’s discretion.    °  °For prescription refill requests, have your pharmacy contact our office and allow 72 hours for refills to be completed.   ° °Today you received the following chemotherapy and/or immunotherapy agents Kadcyla    °  °To help prevent nausea and vomiting after your treatment, we encourage you to take your nausea medication as directed. ° °BELOW ARE SYMPTOMS THAT SHOULD BE REPORTED IMMEDIATELY: °*FEVER GREATER THAN 100.4 F (38 °C) OR HIGHER °*CHILLS OR SWEATING °*NAUSEA AND VOMITING THAT IS NOT CONTROLLED WITH YOUR NAUSEA MEDICATION °*UNUSUAL SHORTNESS OF BREATH °*UNUSUAL BRUISING OR BLEEDING °*URINARY PROBLEMS (pain or burning when urinating, or frequent urination) °*BOWEL PROBLEMS (unusual diarrhea, constipation, pain near the anus) °TENDERNESS IN MOUTH AND THROAT WITH OR WITHOUT PRESENCE OF ULCERS (sore throat, sores in mouth, or a toothache) °UNUSUAL RASH, SWELLING OR PAIN  °UNUSUAL VAGINAL DISCHARGE OR ITCHING  ° °Items with * indicate a potential emergency and should be followed up as soon as possible or go to the Emergency Department if any problems should occur. ° °Please show the CHEMOTHERAPY ALERT CARD or IMMUNOTHERAPY ALERT CARD at check-in to the  Emergency Department and triage nurse. ° °Should you have questions after your visit or need to cancel or reschedule your appointment, please contact [facility]  Dept: [phone]  and follow the prompts.  Office hours are 8:00 a.m. to 4:30 p.m. Monday - Friday. Please note that voicemails left after 4:00 p.m. may not be returned until the following business day.  We are closed weekends and major holidays. You have access to a nurse at all times for urgent questions. Please call the main number to the clinic Dept: [phone] and follow the prompts. ° ° °For any non-urgent questions, you may also contact your provider using MyChart. We now offer e-Visits for anyone 18 and older to request care online for non-urgent symptoms. For details visit [email or web address]. °  °Also download the MyChart app! Go to the app store, search "MyChart", open the app, select , and log in with your MyChart username and password. ° °Due to Covid, a mask is required upon entering the hospital/clinic. If you do not have a mask, one will be given to you upon arrival. For doctor visits, patients may have 1 support person aged 18 or older with them. For treatment visits, patients cannot have anyone with them due to current Covid guidelines and our immunocompromised population.  ° °

## 2021-04-09 ENCOUNTER — Other Ambulatory Visit: Payer: Self-pay | Admitting: Hematology and Oncology

## 2021-04-10 ENCOUNTER — Other Ambulatory Visit: Payer: Self-pay | Admitting: *Deleted

## 2021-04-10 ENCOUNTER — Other Ambulatory Visit: Payer: Self-pay | Admitting: Hematology and Oncology

## 2021-04-10 MED ORDER — ALBUTEROL SULFATE (2.5 MG/3ML) 0.083% IN NEBU: 2.5000 mg | INHALATION_SOLUTION | Freq: Four times a day (QID) | RESPIRATORY_TRACT | 12 refills | Status: DC | PRN

## 2021-04-10 MED ORDER — ALBUTEROL SULFATE (2.5 MG/3ML) 0.083% IN NEBU: 1.2500 mg | INHALATION_SOLUTION | Freq: Four times a day (QID) | RESPIRATORY_TRACT | 12 refills | Status: AC | PRN

## 2021-04-20 ENCOUNTER — Inpatient Hospital Stay: Payer: Medicare HMO | Admitting: Hematology and Oncology

## 2021-04-20 ENCOUNTER — Inpatient Hospital Stay: Payer: Medicare HMO

## 2021-04-20 ENCOUNTER — Encounter: Payer: Self-pay | Admitting: *Deleted

## 2021-04-21 ENCOUNTER — Other Ambulatory Visit: Payer: Self-pay | Admitting: Hematology and Oncology

## 2021-04-25 NOTE — Progress Notes (Signed)
? ?Patient Care Team: ?Pcp, No as PCP - General ?[name], MD as PCP - Cardiology (Cardiology) ?Mauro Kaufmann, RN as Oncology Nurse Navigator ?Rockwell Germany, RN as Oncology Nurse Navigator ?Erroll Luna, MD as Consulting Physician (General Surgery) ?Nicholas Lose, MD as Consulting Physician (Hematology and Oncology) ? ?DIAGNOSIS:  ?Encounter Diagnosis  ?Name Primary?  ? Malignant neoplasm involving both nipple and areola of right breast in female, estrogen receptor positive ([place])   ? ? ?SUMMARY OF ONCOLOGIC HISTORY: ?Oncology History  ?Malignant neoplasm of right breast in female, estrogen receptor positive ([place])  ?04/18/2020 Initial Diagnosis  ? Patient presented to the ED with complaint of syncope and found to have a fungating right breast mass. CT showed a right breast mass, 8.1cm, and right axillary adenopathy. Patient reported a right breast mass present for past 2-3 years. Biopsy showed IDC, grade 3, HER-2 equivocal by IHC, positive by FISH (ratio 3.56), ER+ 95%, PR+ 70%, ,Ki67 25%.  ?  ?04/18/2020 Cancer Staging  ? Staging form: Breast, AJCC 8th Edition ?- Clinical stage from 04/18/2020: Stage IIIB (cT4b, cN1, cM0, G3, ER+, PR+, HER2+) - Signed by Gardenia Phlegm, NP on 04/27/2020 ?Stage prefix: Initial diagnosis ?Histologic grading system: 3 grade system ? ?  ?05/13/2020 - 08/29/2020 Chemotherapy  ?  ? ?  ? ?  ?10/11/2020 -  Chemotherapy  ? Patient is on Treatment Plan : BREAST ADO-Trastuzumab Emtansine (Kadcyla) q21d  ?   ? ? ?CHIEF COMPLIANT: Kadcyla   maintenance ? ?INTERVAL HISTORY: Elizabeth Chase is a  ? 81 y.o. with above-mentioned history of HER-2 positive right breast cancer currently on chemotherapy with Kadcyla. She presents to the clinic today for treatment.  She has been tolerating Kadcyla extremely well without any problems or concerns.  Mild peripheral neuropathy.  She has 1 more treatment of Kadcyla left.  She is also here to discuss starting antiestrogen  therapy. ? ?ALLERGIES:  is allergic to bee venom. ? ?MEDICATIONS:  ?Current Outpatient Medications  ?Medication Sig Dispense Refill  ? albuterol (PROVENTIL) (2.5 MG/3ML) 0.083% nebulizer solution Take 3 mLs (2.5 mg total) by nebulization every 6 (six) hours as needed for wheezing or shortness of breath. 75 mL 12  ? albuterol (PROVENTIL) (2.5 MG/3ML) 0.083% nebulizer solution Take 1.5 mLs (1.25 mg total) by nebulization every 6 (six) hours as needed for wheezing or shortness of breath. 90 mL 12  ? atorvastatin (LIPITOR) 20 MG tablet TAKE 1 TABLET BY MOUTH DAILY AT 6 PM. 90 tablet 0  ? cholecalciferol (VITAMIN D) 25 MCG (1000 UNIT) tablet Take 1,000 Units by mouth in the morning.    ? dexamethasone (DECADRON) 0.5 MG tablet Take 1 tablet (0.5 mg total) by mouth daily with breakfast. 30 tablet 1  ? feeding supplement (BOOST HIGH PROTEIN) LIQD Take 237 mLs by mouth 3 (three) times daily between meals. Please give her 90 bottles (Patient taking differently: Take 0.5 Containers by mouth in the morning and at bedtime. Please give her 90 bottles) 237 mL 6  ? loratadine (CLARITIN) 10 MG tablet Take 10 mg by mouth See admin instructions. Take 1 tablet (10 mg) by mouth for 4 days after chemotherapy (Patient not taking: Reported on 03/15/2021)    ? trazodone (DESYREL) 300 MG tablet TAKE 1 TABLET BY MOUTH EVERYDAY AT BEDTIME 90 tablet 0  ? ?No current facility-administered medications for this visit.  ? ? ?PHYSICAL EXAMINATION: ?ECOG PERFORMANCE STATUS: 1 - Symptomatic but completely ambulatory ? ?Vitals:  ? 04/27/21 0923  ?  BP: (!) 159/69  ?Pulse: 84  ?Resp: 19  ?Temp: 97.8 ?F (36.6 ?C)  ?SpO2: 90%  ? ?Filed Weights  ? 04/27/21 0923  ?Weight: 127 lb 14.4 oz (58 kg)  ? ?  ? ?LABORATORY DATA:  ?I have reviewed the data as listed ?CMP Latest Ref Rng & Units 04/06/2021 03/09/2021 02/17/2021  ?Glucose 70 - 99 mg/dL 165(H) 134(H) 83  ?BUN 8 - 23 mg/dL _0 ?Creatinine 0.44 - 1.00 mg/dL 0.73 0.64 0.74  ?Sodium 135 - 145 mmol/L 139 140  139  ?Potassium 3.5 - 5.1 mmol/L 3.4(L) 3.3(L) 3.9  ?Chloride 98 - 111 mmol/L 105 106 105  ?CO2 22 - 32 mmol/L _1 ?Calcium 8.9 - 10.3 mg/dL 9.1 8.7(L) 8.9  ?Total Protein 6.5 - 8.1 g/dL 7.2 7.0 7.4  ?Total Bilirubin 0.3 - 1.2 mg/dL 0.5 0.3 0.6  ?Alkaline Phos 38 - 126 U/L 65 54 64  ?AST 15 - 41 U/L _2 ?ALT 0 - 44 U/L _3 ? ? ?Lab Results  ?Component Value Date  ? WBC 5.0 04/27/2021  ? HGB 12.2 04/27/2021  ? HCT 37.7 04/27/2021  ? MCV 86.5 04/27/2021  ? PLT 129 (L) 04/27/2021  ? NEUTROABS 3.5 04/27/2021  ? ? ?ASSESSMENT & PLAN:  ?Malignant neoplasm of right breast in female, estrogen receptor positive ([place]) ?Stage IIIc fungating tumor of the right breast: ER/PR and HER-2 positive ?04/15/20: CT CAP: No distant mets ?  ?Treatment Plan: ?1. Neoadj chemo with Taxotere Herceptin Perjeta x6 cycles completed 08/26/2020 followed by [employer] maintenance ?2. Mastectomy with sentinel node biopsy: 09/20/2020: Residual invasive ductal carcinoma 7.4 cm invades dermis and skin and skeletal muscle, 4/14 lymph nodes positive, margins negative, lymphovascular invasion present, ER 95%, PR 20%, HER2 positive, Ki-67 25% ?3. Adj XRT completed 02/10/2021 ?4. Adj Anti estrogen therapy ?-------------------------------------------------------------------------------------------------- ?Current treatment: Kadcyla maintenance started 10/11/2020, today cycle 10 ?Kadcyla toxicities: ?Denies any adverse effects to Kadcyla ?Chemotherapy-induced anemia: Monitoring today's hemoglobin is 12.2 ? ?Letrozole counseling: We discussed the risks and benefits of anti-estrogen therapy with aromatase inhibitors. These include but not limited to insomnia, hot flashes, mood changes, vaginal dryness, bone density loss, and weight gain. We strongly believe that the benefits far outweigh the risks. Patient understands these risks and consented to starting treatment. Planned treatment duration is 5 years. ? ?Monitoring for neuropathy as well: Patient  has mild neuropathy in the right hand.  This is stable ?  ?I sent a request for a mammogram to be done in the left breast. ?Return to clinic every 3 months for survivorship care plan visit. ? ? ? ?No orders of the defined types were placed in this encounter. ? ?The patient has a good understanding of the overall plan. she agrees with it. she will call with any problems that may develop before the next visit here. ?Total time spent: 30 mins including face to face time and time spent for planning, charting and co-ordination of care ? ? Harriette Ohara, MD ?04/27/21 ? ? ? I, Gardiner Coins, am acting as a scribe for Dr. Lindi Adie  ?

## 2021-04-27 ENCOUNTER — Inpatient Hospital Stay: Payer: Medicare HMO

## 2021-04-27 ENCOUNTER — Inpatient Hospital Stay (HOSPITAL_BASED_OUTPATIENT_CLINIC_OR_DEPARTMENT_OTHER): Payer: Medicare HMO | Admitting: Hematology and Oncology

## 2021-04-27 ENCOUNTER — Inpatient Hospital Stay: Payer: Medicare HMO | Attending: Hematology and Oncology

## 2021-04-27 ENCOUNTER — Other Ambulatory Visit: Payer: Self-pay

## 2021-04-27 ENCOUNTER — Encounter: Payer: Self-pay | Admitting: Licensed Clinical Social Worker

## 2021-04-27 ENCOUNTER — Encounter: Payer: Self-pay | Admitting: Nutrition

## 2021-04-27 VITALS — BP 145/61 | HR 81 | Temp 97.8°F | Resp 19

## 2021-04-27 DIAGNOSIS — C50011 Malignant neoplasm of nipple and areola, right female breast: Secondary | ICD-10-CM | POA: Diagnosis not present

## 2021-04-27 DIAGNOSIS — Z170 Estrogen receptor positive status [ER+]: Secondary | ICD-10-CM | POA: Diagnosis not present

## 2021-04-27 DIAGNOSIS — Z5112 Encounter for antineoplastic immunotherapy: Secondary | ICD-10-CM | POA: Diagnosis not present

## 2021-04-27 LAB — CMP (CANCER CENTER ONLY)
ALT: 25 U/L (ref 0–44)
AST: 27 U/L (ref 15–41)
Albumin: 3.6 g/dL (ref 3.5–5.0)
Alkaline Phosphatase: 67 U/L (ref 38–126)
Anion gap: 6 (ref 5–15)
BUN: 23 mg/dL (ref 8–23)
CO2: 30 mmol/L (ref 22–32)
Calcium: 9.4 mg/dL (ref 8.9–10.3)
Chloride: 106 mmol/L (ref 98–111)
Creatinine: 0.68 mg/dL (ref 0.44–1.00)
GFR, Estimated: >60 mL/min (ref >60)
Glucose, Bld: 96 mg/dL (ref 70–99)
Potassium: 3.2 mmol/L — ABNORMAL LOW (ref 3.5–5.1)
Sodium: 142 mmol/L (ref 135–145)
Total Bilirubin: 0.4 mg/dL (ref 0.3–1.2)
Total Protein: 7.7 g/dL (ref 6.5–8.1)

## 2021-04-27 LAB — CBC WITH DIFFERENTIAL (CANCER CENTER ONLY)
Abs Immature Granulocytes: 0.01 10*3/uL (ref 0.00–0.07)
Basophils Absolute: 0 10*3/uL (ref 0.0–0.1)
Basophils Relative: 1 %
Eosinophils Absolute: 0.1 10*3/uL (ref 0.0–0.5)
Eosinophils Relative: 3 %
HCT: 37.7 % (ref 36.0–46.0)
Hemoglobin: 12.2 g/dL (ref 12.0–15.0)
Immature Granulocytes: 0 %
Lymphocytes Relative: 16 %
Lymphs Abs: 0.8 10*3/uL (ref 0.7–4.0)
MCH: 28 pg (ref 26.0–34.0)
MCHC: 32.4 g/dL (ref 30.0–36.0)
MCV: 86.5 fL (ref 80.0–100.0)
Monocytes Absolute: 0.5 10*3/uL (ref 0.1–1.0)
Monocytes Relative: 10 %
Neutro Abs: 3.5 10*3/uL (ref 1.7–7.7)
Neutrophils Relative %: 70 %
Platelet Count: 129 10*3/uL — ABNORMAL LOW (ref 150–400)
RBC: 4.36 MIL/uL (ref 3.87–5.11)
RDW: 15.9 % — ABNORMAL HIGH (ref 11.5–15.5)
WBC Count: 5 10*3/uL (ref 4.0–10.5)
nRBC: 0 % (ref 0.0–0.2)

## 2021-04-27 MED ORDER — HEPARIN SOD (PORK) LOCK FLUSH 100 UNIT/ML IV SOLN
500.0000 [IU] | Freq: Once | INTRAVENOUS | Status: AC | PRN
  Administered 2021-04-27: 500 [IU]

## 2021-04-27 MED ORDER — ACETAMINOPHEN 325 MG PO TABS
650.0000 mg | ORAL_TABLET | Freq: Once | ORAL | Status: AC
  Administered 2021-04-27: 650 mg via ORAL
  Filled 2021-04-27: qty 2

## 2021-04-27 MED ORDER — SODIUM CHLORIDE 0.9% FLUSH
10.0000 mL | INTRAVENOUS | Status: DC | PRN
  Administered 2021-04-27: 10 mL

## 2021-04-27 MED ORDER — DIPHENHYDRAMINE HCL 25 MG PO CAPS
25.0000 mg | ORAL_CAPSULE | Freq: Once | ORAL | Status: AC
  Administered 2021-04-27: 25 mg via ORAL
  Filled 2021-04-27: qty 1

## 2021-04-27 MED ORDER — SODIUM CHLORIDE 0.9% FLUSH
10.0000 mL | Freq: Once | INTRAVENOUS | Status: AC
  Administered 2021-04-27: 10 mL

## 2021-04-27 MED ORDER — SODIUM CHLORIDE 0.9 % IV SOLN: Freq: Once | INTRAVENOUS | Status: AC

## 2021-04-27 MED ORDER — LETROZOLE 2.5 MG PO TABS
2.5000 mg | ORAL_TABLET | Freq: Every day | ORAL | 3 refills | Status: DC
Start: 2021-04-27 — End: 2022-05-14

## 2021-04-27 MED ORDER — SODIUM CHLORIDE 0.9 % IV SOLN
3.6000 mg/kg | Freq: Once | INTRAVENOUS | Status: AC
  Administered 2021-04-27: 200 mg via INTRAVENOUS
  Filled 2021-04-27: qty 10

## 2021-04-27 NOTE — Progress Notes (Signed)
Pt declined to stay 30 minute post infusion observation period. VSS upon discharge.   

## 2021-04-27 NOTE — Assessment & Plan Note (Signed)
Stage IIIc fungating tumor of the right breast: ER/PR and HER-2 positive ?04/15/20: CT CAP: No distant mets ?? ?Treatment Plan: ?1. Neoadj chemo with?Taxotere Herceptin Perjeta?x6 cycles completed 08/26/2020 followed by [employer] maintenance ?2. Mastectomy?with sentinel node biopsy: 09/20/2020: Residual invasive ductal carcinoma 7.4 cm invades dermis and skin and skeletal muscle, 4/14 lymph nodes positive, margins negative, lymphovascular invasion present, ER 95%, PR 20%, HER2 positive, Ki-67 25% ?3. Adj XRT completed 02/10/2021 ?4. Adj Anti estrogen therapy ?-------------------------------------------------------------------------------------------------- ?Current treatment: Kadcyla maintenance started 10/11/2020, today cycle 10 ?Kadcyla toxicities: ?Denies any adverse effects to Kadcyla ?Chemotherapy-induced anemia: Monitoring?today's hemoglobin is 11 ?? ?Monitoring for neuropathy as well: Patient has mild neuropathy in the right hand.  This is stable ?? ?? ?Return to clinic every 3 weeks for Kadcyla and every 6 weeks with follow-up with me. ?

## 2021-04-27 NOTE — Progress Notes (Signed)
[facility] CSW Progress Note ? ?Clinical Social Worker met with patient and daughter. They both report doing well overall. They have been able to get a car although they are still working on finding their own housing. Pt is glad to almost be done with treatment although she stated that she will miss the people at [facility]. No other SW needs at this time. ? ? ? ?Christeen Douglas , LCSW ?

## 2021-04-27 NOTE — Patient Instructions (Signed)
[facility]  Discharge Instructions: °Thank you for choosing [facility] to provide your oncology and hematology care.  ° °If you have a lab appointment with the Cancer Center, please go directly to the Cancer Center and check in at the registration area. °  °Wear comfortable clothing and clothing appropriate for easy access to any Portacath or PICC line.  ° °We strive to give you quality time with your provider. You may need to reschedule your appointment if you arrive late (15 or more minutes).  Arriving late affects you and other patients whose appointments are after yours.  Also, if you miss three or more appointments without notifying the office, you may be dismissed from the clinic at the provider’s discretion.    °  °For prescription refill requests, have your pharmacy contact our office and allow 72 hours for refills to be completed.   ° °Today you received the following chemotherapy and/or immunotherapy agents Kadcyla    °  °To help prevent nausea and vomiting after your treatment, we encourage you to take your nausea medication as directed. ° °BELOW ARE SYMPTOMS THAT SHOULD BE REPORTED IMMEDIATELY: °*FEVER GREATER THAN 100.4 F (38 °C) OR HIGHER °*CHILLS OR SWEATING °*NAUSEA AND VOMITING THAT IS NOT CONTROLLED WITH YOUR NAUSEA MEDICATION °*UNUSUAL SHORTNESS OF BREATH °*UNUSUAL BRUISING OR BLEEDING °*URINARY PROBLEMS (pain or burning when urinating, or frequent urination) °*BOWEL PROBLEMS (unusual diarrhea, constipation, pain near the anus) °TENDERNESS IN MOUTH AND THROAT WITH OR WITHOUT PRESENCE OF ULCERS (sore throat, sores in mouth, or a toothache) °UNUSUAL RASH, SWELLING OR PAIN  °UNUSUAL VAGINAL DISCHARGE OR ITCHING  ° °Items with * indicate a potential emergency and should be followed up as soon as possible or go to the Emergency Department if any problems should occur. ° °Please show the CHEMOTHERAPY ALERT CARD or IMMUNOTHERAPY ALERT CARD at check-in to the  Emergency Department and triage nurse. ° °Should you have questions after your visit or need to cancel or reschedule your appointment, please contact [facility]  Dept: [phone]  and follow the prompts.  Office hours are 8:00 a.m. to 4:30 p.m. Monday - Friday. Please note that voicemails left after 4:00 p.m. may not be returned until the following business day.  We are closed weekends and major holidays. You have access to a nurse at all times for urgent questions. Please call the main number to the clinic Dept: [phone] and follow the prompts. ° ° °For any non-urgent questions, you may also contact your provider using MyChart. We now offer e-Visits for anyone 18 and older to request care online for non-urgent symptoms. For details visit [email or web address]. °  °Also download the MyChart app! Go to the app store, search "MyChart", open the app, select [facility], and log in with your MyChart username and password. ° °Due to Covid, a mask is required upon entering the hospital/clinic. If you do not have a mask, one will be given to you upon arrival. For doctor visits, patients may have 1 support Elizabeth Chase aged 18 or older with them. For treatment visits, patients cannot have anyone with them due to current Covid guidelines and our immunocompromised population.  ° °

## 2021-04-27 NOTE — Progress Notes (Signed)
Provided one complimentary case of ensure high protein. 

## 2021-04-28 ENCOUNTER — Telehealth: Payer: Self-pay | Admitting: Hematology and Oncology

## 2021-04-28 NOTE — Telephone Encounter (Signed)
Scheduled appointment per 3/16 los. Left message. Patient will be mailed an updated calendar. ?

## 2021-05-11 ENCOUNTER — Encounter: Payer: Medicare HMO | Admitting: Nutrition

## 2021-05-11 ENCOUNTER — Other Ambulatory Visit: Payer: Medicare HMO

## 2021-05-11 ENCOUNTER — Ambulatory Visit: Payer: Medicare HMO

## 2021-05-18 ENCOUNTER — Inpatient Hospital Stay: Payer: Medicare HMO

## 2021-05-18 ENCOUNTER — Inpatient Hospital Stay: Payer: Medicare HMO | Attending: Hematology and Oncology

## 2021-05-18 ENCOUNTER — Encounter: Payer: Self-pay | Admitting: Nutrition

## 2021-05-18 ENCOUNTER — Encounter: Payer: Self-pay | Admitting: *Deleted

## 2021-05-18 ENCOUNTER — Inpatient Hospital Stay: Payer: Medicare HMO | Admitting: Nutrition

## 2021-05-18 ENCOUNTER — Other Ambulatory Visit: Payer: Self-pay

## 2021-05-18 VITALS — BP 158/66 | HR 84 | Temp 98.0°F | Resp 18 | Wt 123.5 lb

## 2021-05-18 DIAGNOSIS — Z5112 Encounter for antineoplastic immunotherapy: Secondary | ICD-10-CM | POA: Insufficient documentation

## 2021-05-18 DIAGNOSIS — C50011 Malignant neoplasm of nipple and areola, right female breast: Secondary | ICD-10-CM | POA: Insufficient documentation

## 2021-05-18 DIAGNOSIS — Z170 Estrogen receptor positive status [ER+]: Secondary | ICD-10-CM | POA: Diagnosis not present

## 2021-05-18 DIAGNOSIS — C50911 Malignant neoplasm of unspecified site of right female breast: Secondary | ICD-10-CM

## 2021-05-18 DIAGNOSIS — Z95828 Presence of other vascular implants and grafts: Secondary | ICD-10-CM

## 2021-05-18 LAB — CBC WITH DIFFERENTIAL (CANCER CENTER ONLY)
Abs Immature Granulocytes: 0.01 10*3/uL (ref 0.00–0.07)
Basophils Absolute: 0 10*3/uL (ref 0.0–0.1)
Basophils Relative: 1 %
Eosinophils Absolute: 0.1 10*3/uL (ref 0.0–0.5)
Eosinophils Relative: 2 %
HCT: 37.4 % (ref 36.0–46.0)
Hemoglobin: 12.1 g/dL (ref 12.0–15.0)
Immature Granulocytes: 0 %
Lymphocytes Relative: 17 %
Lymphs Abs: 0.6 10*3/uL — ABNORMAL LOW (ref 0.7–4.0)
MCH: 28.3 pg (ref 26.0–34.0)
MCHC: 32.4 g/dL (ref 30.0–36.0)
MCV: 87.4 fL (ref 80.0–100.0)
Monocytes Absolute: 0.5 10*3/uL (ref 0.1–1.0)
Monocytes Relative: 12 %
Neutro Abs: 2.5 10*3/uL (ref 1.7–7.7)
Neutrophils Relative %: 68 %
Platelet Count: 126 10*3/uL — ABNORMAL LOW (ref 150–400)
RBC: 4.28 MIL/uL (ref 3.87–5.11)
RDW: 16 % — ABNORMAL HIGH (ref 11.5–15.5)
WBC Count: 3.7 10*3/uL — ABNORMAL LOW (ref 4.0–10.5)
nRBC: 0 % (ref 0.0–0.2)

## 2021-05-18 LAB — CMP (CANCER CENTER ONLY)
ALT: 17 U/L (ref 0–44)
AST: 26 U/L (ref 15–41)
Albumin: 3.5 g/dL (ref 3.5–5.0)
Alkaline Phosphatase: 70 U/L (ref 38–126)
Anion gap: 6 (ref 5–15)
BUN: 20 mg/dL (ref 8–23)
CO2: 29 mmol/L (ref 22–32)
Calcium: 9.3 mg/dL (ref 8.9–10.3)
Chloride: 104 mmol/L (ref 98–111)
Creatinine: 0.76 mg/dL (ref 0.44–1.00)
GFR, Estimated: >60 mL/min (ref >60)
Glucose, Bld: 137 mg/dL — ABNORMAL HIGH (ref 70–99)
Potassium: 3.6 mmol/L (ref 3.5–5.1)
Sodium: 139 mmol/L (ref 135–145)
Total Bilirubin: 0.8 mg/dL (ref 0.3–1.2)
Total Protein: 7.7 g/dL (ref 6.5–8.1)

## 2021-05-18 MED ORDER — SODIUM CHLORIDE 0.9% FLUSH
10.0000 mL | Freq: Once | INTRAVENOUS | Status: AC
  Administered 2021-05-18: 10 mL

## 2021-05-18 MED ORDER — SODIUM CHLORIDE 0.9 % IV SOLN
3.6000 mg/kg | Freq: Once | INTRAVENOUS | Status: AC
  Administered 2021-05-18: 200 mg via INTRAVENOUS
  Filled 2021-05-18: qty 10

## 2021-05-18 MED ORDER — DIPHENHYDRAMINE HCL 25 MG PO CAPS
25.0000 mg | ORAL_CAPSULE | Freq: Once | ORAL | Status: AC
  Administered 2021-05-18: 25 mg via ORAL
  Filled 2021-05-18: qty 1

## 2021-05-18 MED ORDER — ACETAMINOPHEN 325 MG PO TABS
650.0000 mg | ORAL_TABLET | Freq: Once | ORAL | Status: AC
  Administered 2021-05-18: 650 mg via ORAL
  Filled 2021-05-18: qty 2

## 2021-05-18 MED ORDER — SODIUM CHLORIDE 0.9 % IV SOLN: Freq: Once | INTRAVENOUS | Status: AC

## 2021-05-18 MED ORDER — HEPARIN SOD (PORK) LOCK FLUSH 100 UNIT/ML IV SOLN
500.0000 [IU] | Freq: Once | INTRAVENOUS | Status: AC | PRN
  Administered 2021-05-18: 500 [IU]

## 2021-05-18 MED ORDER — SODIUM CHLORIDE 0.9% FLUSH
10.0000 mL | INTRAVENOUS | Status: DC | PRN
  Administered 2021-05-18: 10 mL

## 2021-05-18 NOTE — Patient Instructions (Signed)
[facility]  Discharge Instructions: °Thank you for choosing [facility] to provide your oncology and hematology care.  ° °If you have a lab appointment with the Cancer Center, please go directly to the Cancer Center and check in at the registration area. °  °Wear comfortable clothing and clothing appropriate for easy access to any Portacath or PICC line.  ° °We strive to give you quality time with your provider. You may need to reschedule your appointment if you arrive late (15 or more minutes).  Arriving late affects you and other patients whose appointments are after yours.  Also, if you miss three or more appointments without notifying the office, you may be dismissed from the clinic at the provider’s discretion.    °  °For prescription refill requests, have your pharmacy contact our office and allow 72 hours for refills to be completed.   ° °Today you received the following chemotherapy and/or immunotherapy agents Kadcyla    °  °To help prevent nausea and vomiting after your treatment, we encourage you to take your nausea medication as directed. ° °BELOW ARE SYMPTOMS THAT SHOULD BE REPORTED IMMEDIATELY: °*FEVER GREATER THAN 100.4 F (38 °C) OR HIGHER °*CHILLS OR SWEATING °*NAUSEA AND VOMITING THAT IS NOT CONTROLLED WITH YOUR NAUSEA MEDICATION °*UNUSUAL SHORTNESS OF BREATH °*UNUSUAL BRUISING OR BLEEDING °*URINARY PROBLEMS (pain or burning when urinating, or frequent urination) °*BOWEL PROBLEMS (unusual diarrhea, constipation, pain near the anus) °TENDERNESS IN MOUTH AND THROAT WITH OR WITHOUT PRESENCE OF ULCERS (sore throat, sores in mouth, or a toothache) °UNUSUAL RASH, SWELLING OR PAIN  °UNUSUAL VAGINAL DISCHARGE OR ITCHING  ° °Items with * indicate a potential emergency and should be followed up as soon as possible or go to the Emergency Department if any problems should occur. ° °Please show the CHEMOTHERAPY ALERT CARD or IMMUNOTHERAPY ALERT CARD at check-in to the  Emergency Department and triage nurse. ° °Should you have questions after your visit or need to cancel or reschedule your appointment, please contact [facility]  Dept: [phone]  and follow the prompts.  Office hours are 8:00 a.m. to 4:30 p.m. Monday - Friday. Please note that voicemails left after 4:00 p.m. may not be returned until the following business day.  We are closed weekends and major holidays. You have access to a nurse at all times for urgent questions. Please call the main number to the clinic Dept: [phone] and follow the prompts. ° ° °For any non-urgent questions, you may also contact your provider using MyChart. We now offer e-Visits for anyone 18 and older to request care online for non-urgent symptoms. For details visit [email or web address]. °  °Also download the MyChart app! Go to the app store, search "MyChart", open the app, select [facility], and log in with your MyChart username and password. ° °Due to Covid, a mask is required upon entering the hospital/clinic. If you do not have a mask, one will be given to you upon arrival. For doctor visits, patients may have 1 support person aged 18 or older with them. For treatment visits, patients cannot have anyone with them due to current Covid guidelines and our immunocompromised population.  ° °

## 2021-05-18 NOTE — Progress Notes (Signed)
Patient cancelled nutrition appointment. Provided 1 complimentary case of Ensue Plus High Protein. ?

## 2021-05-24 ENCOUNTER — Other Ambulatory Visit: Payer: Self-pay | Admitting: *Deleted

## 2021-05-24 MED ORDER — VITAMIN D 25 MCG (1000 UNIT) PO TABS: 1000.0000 [IU] | ORAL_TABLET | Freq: Every day | ORAL | 3 refills | Status: AC

## 2021-05-24 MED ORDER — DEXAMETHASONE 1 MG PO TABS
1.0000 mg | ORAL_TABLET | Freq: Every day | ORAL | 0 refills | Status: DC
Start: 2021-05-24 — End: 2021-05-31

## 2021-05-24 MED ORDER — CYANOCOBALAMIN 500 MCG PO TABS: 1000.0000 ug | ORAL_TABLET | Freq: Every day | ORAL | 3 refills | Status: AC

## 2021-05-24 NOTE — Progress Notes (Signed)
Received call from pt with complaint of decreased appetite x several weeks.  Pt states appetite began to decrease once dexamethasone 1 mg tab p.o was discontinued.  Per MD pt needing to resume dexamethasone 1 mg p.o daily and f/u in office in one week.  Pt  educated and verbalized understanding.  ?

## 2021-05-25 ENCOUNTER — Encounter: Payer: Self-pay | Admitting: Nurse Practitioner

## 2021-05-25 ENCOUNTER — Ambulatory Visit (INDEPENDENT_AMBULATORY_CARE_PROVIDER_SITE_OTHER): Payer: Medicare HMO | Admitting: Nurse Practitioner

## 2021-05-25 VITALS — BP 133/58 | HR 88 | Temp 98.3°F | Ht 63.0 in | Wt 125.2 lb

## 2021-05-25 DIAGNOSIS — Z0000 Encounter for general adult medical examination without abnormal findings: Secondary | ICD-10-CM

## 2021-05-25 DIAGNOSIS — K219 Gastro-esophageal reflux disease without esophagitis: Secondary | ICD-10-CM

## 2021-05-25 DIAGNOSIS — R051 Acute cough: Secondary | ICD-10-CM

## 2021-05-25 DIAGNOSIS — J449 Chronic obstructive pulmonary disease, unspecified: Secondary | ICD-10-CM | POA: Diagnosis not present

## 2021-05-25 MED ORDER — CETIRIZINE HCL 10 MG PO TABS: 10.0000 mg | ORAL_TABLET | Freq: Every day | ORAL | 11 refills | Status: AC

## 2021-05-25 MED ORDER — OMEPRAZOLE 20 MG PO CPDR: 20.0000 mg | DELAYED_RELEASE_CAPSULE | Freq: Every day | ORAL | 3 refills | Status: DC

## 2021-05-25 NOTE — Progress Notes (Signed)
$'@Patient'z$  ID: Elizabeth Chase, female    DOB: 1940-12-09, 81 y.o.   MRN: [id number] ? ?Chief Complaint  ?Patient presents with  ? Establish Care  ?  Pt is here to establish care. Pt stated she has chest congestion and she feels jittery   ? ? ?Referring provider: ?No ref. provider found ? ?HPI ? ?81 year old female with history of hypertension, history, COPD, colitis, right thalamic infarction, meningioma, acute kidney injury, systolic murmur, malignant neoplasm of right breast, hyperlipidemia. ? ?Patient presents today to establish care.  Patient has been followed for the last several months by oncology due to recent diagnosis and surgery for breast cancer.  She states that she just finished treatments last week.  She will still be having close follow-up appointments with oncology.  Patient is active and can perform activities of daily living independently.  Patient lives with her daughter.  Overall she has been doing well.  She does complain of decreased appetite.  She also complains cough and acid reflux which we discussed could be related.  We discussed that we can start her on inhaled medication for reflux.  We discussed that patient should eat 6 small meals / snacks throughout the day. Denies f/c/s, n/v/d, hemoptysis, PND, chest pain or edema. ? ? ? ? ?Allergies  ?Allergen Reactions  ? Bee Venom   ?  unknown  ? ? ?Immunization History  ?Administered Date(s) Administered  ? Fluad Quad(high Dose 65+) 12/15/2020  ? ? ?Past Medical History:  ?Diagnosis Date  ? Abnormal brain MRI   ? Abnormal glucose level   ? AKI (acute kidney injury) ([place]) 04/15/2020  ? Anemia   ? Blood pressure abnormally low   ? Breast wound 05/13/2020  ? Colitis 04/15/2020  ? COVID-19 virus infection 04/15/2020  ? Essential hypertension   ? Homelessness 05/13/2020  ? Hyperlipidemia   ? Hypertension   ? Hypocalcemia   ? Malignant neoplasm of right breast in female, estrogen receptor positive ([place])   ? Muscle weakness   ? Neoplasm of breast, female,  malignant ([place])   ? Port-A-Cath in place 05/13/2020  ? Prolonged QT interval 04/15/2020  ? Right thalamic infarction ([place]) 01/01/2017  ? Stroke [facility])   ? Syncope and collapse 04/15/2020  ? Systolic murmur   ? ? ?Tobacco History: ?Social History  ? ?Tobacco Use  ?Smoking Status Former  ? Packs/day: 1.00  ? Types: Cigarettes  ? Quit date: 12/31/2016  ? Years since quitting: 4.4  ?Smokeless Tobacco Never  ? ?Counseling given: Not Answered ? ? ?Outpatient Encounter Medications as of 05/25/2021  ?Medication Sig  ? albuterol (PROVENTIL) (2.5 MG/3ML) 0.083% nebulizer solution Take 3 mLs (2.5 mg total) by nebulization every 6 (six) hours as needed for wheezing or shortness of breath.  ? albuterol (PROVENTIL) (2.5 MG/3ML) 0.083% nebulizer solution Take 1.5 mLs (1.25 mg total) by nebulization every 6 (six) hours as needed for wheezing or shortness of breath.  ? atorvastatin (LIPITOR) 20 MG tablet TAKE 1 TABLET BY MOUTH DAILY AT 6 PM.  ? cetirizine (ZYRTEC) 10 MG tablet Take 1 tablet (10 mg total) by mouth daily.  ? dexamethasone (DECADRON) 1 MG tablet Take 1 tablet (1 mg total) by mouth daily with breakfast.  ? feeding supplement (BOOST HIGH PROTEIN) LIQD Take 237 mLs by mouth 3 (three) times daily between meals. Please give her 90 bottles (Patient taking differently: Take 0.5 Containers by mouth in the morning and at bedtime. Please give her 90 bottles)  ? letrozole [facility]) 2.5  MG tablet Take 1 tablet (2.5 mg total) by mouth daily.  ? omeprazole (PRILOSEC) 20 MG capsule Take 1 capsule (20 mg total) by mouth daily.  ? trazodone (DESYREL) 300 MG tablet TAKE 1 TABLET BY MOUTH EVERYDAY AT BEDTIME  ? vitamin B-12 (CYANOCOBALAMIN) 500 MCG tablet Take 2 tablets (1,000 mcg total) by mouth daily.  ? cholecalciferol (VITAMIN D3) 25 MCG (1000 UNIT) tablet Take 1 tablet (1,000 Units total) by mouth daily. (Patient not taking: Reported on 05/25/2021)  ? [DISCONTINUED] loratadine (CLARITIN) 10 MG tablet Take 10 mg by mouth See admin  instructions. Take 1 tablet (10 mg) by mouth for 4 days after chemotherapy (Patient not taking: Reported on 05/25/2021)  ? ?No facility-administered encounter medications on file as of 05/25/2021.  ? ? ? ?Review of Systems ? ?Review of Systems  ?Constitutional: Negative.   ?HENT: Negative.    ?Respiratory:  Positive for cough.   ?Cardiovascular: Negative.   ?Gastrointestinal: Negative.   ?Allergic/Immunologic: Negative.   ?Neurological: Negative.   ?Psychiatric/Behavioral: Negative.     ? ? ? ?Physical Exam ? ?BP (!) 133/58 (BP Location: Left Arm, Patient Position: Sitting, Cuff Size: Normal)   Pulse 88   Temp 98.3 ?F (36.8 ?C)   Ht '5\' 3"'$  (1.6 m)   Wt 125 lb 3.2 oz (56.8 kg)   SpO2 90%   BMI 22.18 kg/m?  ? ?Wt Readings from Last 5 Encounters:  ?05/25/21 125 lb 3.2 oz (56.8 kg)  ?05/18/21 123 lb 8 oz (56 kg)  ?04/27/21 127 lb 14.4 oz (58 kg)  ?04/06/21 121 lb 12 oz (55.2 kg)  ?03/15/21 121 lb 4 oz (55 kg)  ? ? ? ?Physical Exam ?Vitals and nursing note reviewed.  ?Constitutional:   ?   General: She is not in acute distress. ?   Appearance: She is well-developed.  ?Cardiovascular:  ?   Rate and Rhythm: Normal rate and regular rhythm.  ?Pulmonary:  ?   Effort: Pulmonary effort is normal.  ?   Breath sounds: Normal breath sounds.  ?Neurological:  ?   Mental Status: She is alert and oriented to person, place, and time.  ? ? ? ? ?Assessment & Plan:  ? ?Health care maintenance ?- POCT URINALYSIS DIP (CLINITEK) ?- TSH ?- Lipid Panel ? ? ?2. Acute cough ? ?- cetirizine (ZYRTEC) 10 MG tablet; Take 1 tablet (10 mg total) by mouth daily.  Dispense: 30 tablet; Refill: 11 ?- omeprazole (PRILOSEC) 20 MG capsule; Take 1 capsule (20 mg total) by mouth daily.  Dispense: 30 capsule; Refill: 3 ? ? ?Follow up: ? ?Follow up in 3 months or sooner if needed ? ? ? ? ?Fenton Foy, NP ?05/25/2021 ? ?

## 2021-05-25 NOTE — Assessment & Plan Note (Signed)
-   POCT URINALYSIS DIP (CLINITEK) ?- TSH ?- Lipid Panel ? ? ?2. Acute cough ? ?- cetirizine (ZYRTEC) 10 MG tablet; Take 1 tablet (10 mg total) by mouth daily.  Dispense: 30 tablet; Refill: 11 ?- omeprazole (PRILOSEC) 20 MG capsule; Take 1 capsule (20 mg total) by mouth daily.  Dispense: 30 capsule; Refill: 3 ? ? ?Follow up: ? ?Follow up in 3 months or sooner if needed ?

## 2021-05-25 NOTE — Patient Instructions (Addendum)
1. Health care maintenance ? ?- POCT URINALYSIS DIP (CLINITEK) ?- TSH ?- Lipid Panel ? ? ?2. Acute cough ? ?- cetirizine (ZYRTEC) 10 MG tablet; Take 1 tablet (10 mg total) by mouth daily.  Dispense: 30 tablet; Refill: 11 ?- omeprazole (PRILOSEC) 20 MG capsule; Take 1 capsule (20 mg total) by mouth daily.  Dispense: 30 capsule; Refill: 3 ? ? ?Follow up: ? ?Follow up in 3 months or sooner if needed ? ?

## 2021-05-26 ENCOUNTER — Other Ambulatory Visit: Payer: Self-pay | Admitting: Adult Health

## 2021-05-26 ENCOUNTER — Ambulatory Visit: Payer: Medicare HMO | Admitting: Nurse Practitioner

## 2021-05-26 DIAGNOSIS — C50911 Malignant neoplasm of unspecified site of right female breast: Secondary | ICD-10-CM

## 2021-05-26 DIAGNOSIS — J438 Other emphysema: Secondary | ICD-10-CM

## 2021-05-26 LAB — LIPID PANEL
Chol/HDL Ratio: 2.3 ratio (ref 0.0–4.4)
Cholesterol, Total: 98 mg/dL — ABNORMAL LOW (ref 100–199)
HDL: 42 mg/dL (ref >39)
LDL Chol Calc (NIH): 38 mg/dL (ref 0–99)
Triglycerides: 94 mg/dL (ref 0–149)
VLDL Cholesterol Cal: 18 mg/dL (ref 5–40)

## 2021-05-26 LAB — TSH: TSH: 2.25 u[IU]/mL (ref 0.450–4.500)

## 2021-05-28 ENCOUNTER — Encounter: Payer: Self-pay | Admitting: Hematology and Oncology

## 2021-05-31 ENCOUNTER — Other Ambulatory Visit: Payer: Self-pay

## 2021-05-31 ENCOUNTER — Inpatient Hospital Stay (HOSPITAL_BASED_OUTPATIENT_CLINIC_OR_DEPARTMENT_OTHER): Payer: Medicare HMO | Admitting: Hematology and Oncology

## 2021-05-31 DIAGNOSIS — Z170 Estrogen receptor positive status [ER+]: Secondary | ICD-10-CM

## 2021-05-31 DIAGNOSIS — Z5112 Encounter for antineoplastic immunotherapy: Secondary | ICD-10-CM | POA: Diagnosis not present

## 2021-05-31 DIAGNOSIS — C50011 Malignant neoplasm of nipple and areola, right female breast: Secondary | ICD-10-CM | POA: Diagnosis not present

## 2021-05-31 MED ORDER — NYSTATIN 100000 UNIT/ML MT SUSP: 5.0000 mL | Freq: Four times a day (QID) | OROMUCOSAL | 0 refills | Status: AC

## 2021-05-31 MED ORDER — PANTOPRAZOLE SODIUM 40 MG PO TBEC: 40.0000 mg | DELAYED_RELEASE_TABLET | Freq: Every day | ORAL | 6 refills | Status: DC

## 2021-05-31 NOTE — Assessment & Plan Note (Addendum)
Stage IIIc fungating tumor of the right breast: ER/PR and HER-2 positive ?04/15/20: CT CAP: No distant mets ?? ?Treatment Plan: ?1. Neoadj chemo with?Taxotere Herceptin Perjeta?x6 cycles completed 08/26/2020 followed by [employer] maintenance ?2. Mastectomy?with sentinel node biopsy: 09/20/2020: Residual invasive ductal carcinoma 7.4 cm invades dermis and skin and skeletal muscle, 4/14 lymph nodes positive, margins negative, lymphovascular invasion present, ER 95%, PR 20%, HER2 positive, Ki-67 25% ?3. Adj XRT?completed 02/10/2021 ?4. Adj Anti estrogen therapy with letrozole started 04/27/2021 ?-------------------------------------------------------------------------------------------------- ?Current treatment: Letrozole ?Chemotherapy-induced anemia: Monitoring?today's hemoglobin is 12.2 ?? ?Letrozole toxicities: Tolerating letrozole extremely well without any problems or concerns. ? ?Monitoring for neuropathy as well: Patient has mild neuropathy in the right hand.??This is stable ?Breast cancer surveillance: Mammogram scheduled for 06/05/2021 ? ?Return to clinic for survivorship appointment in June. ?

## 2021-05-31 NOTE — Progress Notes (Signed)
? ?Patient Care Team: ?Fenton Foy, NP as PCP - General (Pulmonary Disease) ?[name], MD as PCP - Cardiology (Cardiology) ?Mauro Kaufmann, RN as Oncology Nurse Navigator ?Rockwell Germany, RN as Oncology Nurse Navigator ?Erroll Luna, MD as Consulting Physician (General Surgery) ?Nicholas Lose, MD as Consulting Physician (Hematology and Oncology) ? ?DIAGNOSIS:  ?Encounter Diagnosis  ?Name Primary?  ? Malignant neoplasm involving both nipple and areola of right breast in female, estrogen receptor positive ([place])   ? ? ?SUMMARY OF ONCOLOGIC HISTORY: ?Oncology History  ?Malignant neoplasm of right breast in female, estrogen receptor positive ([place])  ?04/18/2020 Initial Diagnosis  ? Patient presented to the ED with complaint of syncope and found to have a fungating right breast mass. CT showed a right breast mass, 8.1cm, and right axillary adenopathy. Patient reported a right breast mass present for past 2-3 years. Biopsy showed IDC, grade 3, HER-2 equivocal by IHC, positive by FISH (ratio 3.56), ER+ 95%, PR+ 70%, ,Ki67 25%.  ?  ?04/18/2020 Cancer Staging  ? Staging form: Breast, AJCC 8th Edition ?- Clinical stage from 04/18/2020: Stage IIIB (cT4b, cN1, cM0, G3, ER+, PR+, HER2+) - Signed by Gardenia Phlegm, NP on 04/27/2020 ?Stage prefix: Initial diagnosis ?Histologic grading system: 3 grade system ? ?  ?05/13/2020 - 08/29/2020 Chemotherapy  ? Neoadjuvant chemo with Taxotere Herceptin and Perjeta x6 cycles ? ?  ? ?  ?10/11/2020 - 05/18/2021 Chemotherapy  ? Patient is on Treatment Plan : BREAST ADO-Trastuzumab Emtansine (Kadcyla) q21d  ? ?  ?  ? ? ?CHIEF COMPLIANT: Follow-up on Letrozole, loss of appetite ? ?INTERVAL HISTORY: Elizabeth Chase is a 81 y.o. with above-mentioned history of HER-2 positive right breast cancer who completed Kadcyla. She presents to the clinic today for treatment. She complaining of no appetite after treatment. She states that the inside of her mouth hurts. Complains of her tongue  being very dry.  She was started on dexamethasone and has been taking it for a week and it has improved her appetite but it caused her profound discomfort in the throat. ? ? ?ALLERGIES:  is allergic to bee venom. ? ?MEDICATIONS:  ?Current Outpatient Medications  ?Medication Sig Dispense Refill  ? nystatin (MYCOSTATIN) 100000 UNIT/ML suspension Take 5 mLs (500,000 Units total) by mouth 4 (four) times daily. 60 mL 0  ? pantoprazole (PROTONIX) 40 MG tablet Take 1 tablet (40 mg total) by mouth daily. 30 tablet 6  ? albuterol (PROVENTIL) (2.5 MG/3ML) 0.083% nebulizer solution Take 1.5 mLs (1.25 mg total) by nebulization every 6 (six) hours as needed for wheezing or shortness of breath. 90 mL 12  ? atorvastatin (LIPITOR) 20 MG tablet TAKE 1 TABLET BY MOUTH DAILY AT 6 PM. 90 tablet 0  ? cetirizine (ZYRTEC) 10 MG tablet Take 1 tablet (10 mg total) by mouth daily. 30 tablet 11  ? cholecalciferol (VITAMIN D3) 25 MCG (1000 UNIT) tablet Take 1 tablet (1,000 Units total) by mouth daily. (Patient not taking: Reported on 05/25/2021) 90 tablet 3  ? feeding supplement (BOOST HIGH PROTEIN) LIQD Take 237 mLs by mouth 3 (three) times daily between meals. Please give her 90 bottles (Patient taking differently: Take 0.5 Containers by mouth in the morning and at bedtime. Please give her 90 bottles) 237 mL 6  ? letrozole (FEMARA) 2.5 MG tablet Take 1 tablet (2.5 mg total) by mouth daily. 90 tablet 3  ? trazodone (DESYREL) 300 MG tablet TAKE 1 TABLET BY MOUTH EVERYDAY AT BEDTIME 90 tablet 0  ?  vitamin B-12 (CYANOCOBALAMIN) 500 MCG tablet Take 2 tablets (1,000 mcg total) by mouth daily. 90 tablet 3  ? ?No current facility-administered medications for this visit.  ? ? ?PHYSICAL EXAMINATION: ?ECOG PERFORMANCE STATUS: 1 - Symptomatic but completely ambulatory ? ?Vitals:  ? 05/31/21 1152  ?BP: (!) 150/65  ?Pulse: 72  ?Resp: 18  ?Temp: (!) 97.5 ?F (36.4 ?C)  ?SpO2: 100%  ? ?Filed Weights  ? 05/31/21 1152  ?Weight: 124 lb 1.6 oz (56.3 kg)  ? ?   ? ?LABORATORY DATA:  ?I have reviewed the data as listed ? ?  Latest Ref Rng & Units 05/18/2021  ?  9:54 AM 04/27/2021  ?  9:11 AM 04/06/2021  ?  9:16 AM  ?CMP  ?Glucose 70 - 99 mg/dL 137   96   165    ?BUN 8 - 23 mg/dL _0 ?Creatinine 0.44 - 1.00 mg/dL 0.76   0.68   0.73    ?Sodium 135 - 145 mmol/L 139   142   139    ?Potassium 3.5 - 5.1 mmol/L 3.6   3.2   3.4    ?Chloride 98 - 111 mmol/L 104   106   105    ?CO2 22 - 32 mmol/L _1 ?Calcium 8.9 - 10.3 mg/dL 9.3   9.4   9.1    ?Total Protein 6.5 - 8.1 g/dL 7.7   7.7   7.2    ?Total Bilirubin 0.3 - 1.2 mg/dL 0.8   0.4   0.5    ?Alkaline Phos 38 - 126 U/L 70   67   65    ?AST 15 - 41 U/L _2 ?ALT 0 - 44 U/L _3 ? ? ?Lab Results  ?Component Value Date  ? WBC 3.7 (L) 05/18/2021  ? HGB 12.1 05/18/2021  ? HCT 37.4 05/18/2021  ? MCV 87.4 05/18/2021  ? PLT 126 (L) 05/18/2021  ? NEUTROABS 2.5 05/18/2021  ? ? 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  ?Treatment Plan: ?1. Neoadj chemo with Taxotere Herceptin Perjeta x6 cycles completed 08/26/2020 followed by [employer] maintenance ?2. Mastectomy with sentinel node biopsy: 09/20/2020: Residual invasive ductal carcinoma 7.4 cm invades dermis and skin and skeletal muscle, 4/14 lymph nodes positive, margins negative, lymphovascular invasion present, ER 95%, PR 20%, HER2 positive, Ki-67 25% ?3. Adj XRT completed 02/10/2021 ?4. Adj Anti estrogen therapy with letrozole started 04/27/2021 ?-------------------------------------------------------------------------------------------------- ?Current treatment: Letrozole ?Chemotherapy-induced anemia: Monitoring today's hemoglobin is 12.2 ?  ?Letrozole toxicities: Tolerating letrozole extremely well without any problems or concerns. ? ?Monitoring for neuropathy as well: Patient has mild  neuropathy in the right hand.  This is stable ?Breast cancer surveillance: Mammogram scheduled for 06/05/2021 ? ?Loss of appetite: Unclear etiology could be related to thrush I will send a prescription for nystatin swish and swallow. ?She was given dexamethasone for a week and it did stimulate her appetite but it may have caused the thrush and therefore we will discontinue it at this time. ? ?She tells me that she has at home now and will be moving in at the beginning of the month. ?Return to clinic for survivorship appointment in  June. ? ? ? ?No orders of the defined types were placed in this encounter. ? ?The patient has a good understanding of the overall plan. she agrees with it. she will call with any problems that may develop before the next visit here. ?Total time spent: 30 mins including face to face time and time spent for planning, charting and co-ordination of care ? ? Harriette Ohara, MD ?05/31/21 ? ? ? I Gardiner Coins am scribing for Dr. Lindi Adie ? ?I have reviewed the above documentation for accuracy and completeness, and I agree with the above. ?  ?

## 2021-06-01 ENCOUNTER — Encounter: Payer: Self-pay | Admitting: Licensed Clinical Social Worker

## 2021-06-01 NOTE — Progress Notes (Signed)
[place] CSW Progress Note ? ?Clinical Social Worker received notice that pt and daughter obtained stable housing and had a question about furniture resources. CSW made referral to the [facility] who will contact pt/daughter to complete paperwork. ? ? ? ?Christeen Douglas , LCSW ?

## 2021-06-05 ENCOUNTER — Ambulatory Visit
Admission: RE | Admit: 2021-06-05 | Discharge: 2021-06-05 | Disposition: A | Discharge status: Home / Self Care | Payer: Medicare HMO | Source: Ambulatory Visit | Attending: Hematology and Oncology | Admitting: Hematology and Oncology

## 2021-06-05 ENCOUNTER — Other Ambulatory Visit: Payer: Self-pay | Admitting: Hematology and Oncology

## 2021-06-05 DIAGNOSIS — C50011 Malignant neoplasm of nipple and areola, right female breast: Secondary | ICD-10-CM

## 2021-06-05 DIAGNOSIS — Z170 Estrogen receptor positive status [ER+]: Secondary | ICD-10-CM

## 2021-06-05 DIAGNOSIS — Z95828 Presence of other vascular implants and grafts: Secondary | ICD-10-CM | POA: Diagnosis not present

## 2021-06-05 DIAGNOSIS — C50111 Malignant neoplasm of central portion of right female breast: Secondary | ICD-10-CM | POA: Diagnosis not present

## 2021-06-05 DIAGNOSIS — Z853 Personal history of malignant neoplasm of breast: Secondary | ICD-10-CM | POA: Diagnosis not present

## 2021-06-29 ENCOUNTER — Telehealth: Payer: Self-pay | Admitting: *Deleted

## 2021-06-29 NOTE — Telephone Encounter (Signed)
Received call from pt daughter [name] stating pt is experiencing fatigue, joint pain, and decreased appetite x several weeks.  [name] stated she believed it was related to Letrozole and requesting advice from MD.  Per MD pt to stop Letrozole x3 weeks to see if symptoms improve.  Pt is already scheduled at that time for a f/u with survivorship where an assessment will be done on symptoms.  [name] educated to contact our office if symptoms worsen. Pt daughter verbalized understanding.

## 2021-07-04 ENCOUNTER — Other Ambulatory Visit: Payer: Self-pay | Admitting: Nurse Practitioner

## 2021-07-04 DIAGNOSIS — R051 Acute cough: Secondary | ICD-10-CM

## 2021-07-17 ENCOUNTER — Other Ambulatory Visit: Payer: Self-pay | Admitting: Hematology and Oncology

## 2021-07-19 ENCOUNTER — Other Ambulatory Visit: Payer: Self-pay | Admitting: Adult Health

## 2021-07-19 DIAGNOSIS — C50911 Malignant neoplasm of unspecified site of right female breast: Secondary | ICD-10-CM

## 2021-07-19 DIAGNOSIS — J438 Other emphysema: Secondary | ICD-10-CM

## 2021-07-26 ENCOUNTER — Other Ambulatory Visit: Payer: Self-pay | Admitting: Adult Health

## 2021-07-26 ENCOUNTER — Other Ambulatory Visit: Payer: Self-pay | Admitting: *Deleted

## 2021-07-26 ENCOUNTER — Inpatient Hospital Stay: Payer: Medicare HMO | Attending: Hematology and Oncology

## 2021-07-26 DIAGNOSIS — C50911 Malignant neoplasm of unspecified site of right female breast: Secondary | ICD-10-CM

## 2021-07-26 DIAGNOSIS — J438 Other emphysema: Secondary | ICD-10-CM

## 2021-07-26 MED ORDER — ALBUTEROL SULFATE HFA 108 (90 BASE) MCG/ACT IN AERS: 2.0000 | INHALATION_SPRAY | Freq: Four times a day (QID) | RESPIRATORY_TRACT | 2 refills | Status: DC | PRN
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
## 2021-07-27 ENCOUNTER — Inpatient Hospital Stay: Payer: Medicare HMO | Admitting: Adult Health

## 2021-08-03 DIAGNOSIS — Z452 Encounter for adjustment and management of vascular access device: Secondary | ICD-10-CM | POA: Diagnosis not present

## 2021-08-08 ENCOUNTER — Other Ambulatory Visit: Payer: Self-pay | Admitting: Hematology and Oncology

## 2021-08-11 ENCOUNTER — Inpatient Hospital Stay: Payer: Medicare HMO | Admitting: Hematology and Oncology

## 2021-08-11 NOTE — Assessment & Plan Note (Deleted)
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 Neoadj chemo withTaxotere Herceptin Perjetax6 cycles completed 08/26/2020 followed by [employer] maintenance 2. Mastectomywith sentinel node biopsy: 09/20/2020: Residual invasive ductal carcinoma 7.4 cm invades dermis and skin and skeletal muscle, 4/14 lymph nodes positive, margins negative, lymphovascular invasion present, ER 95%, PR 20%, HER2 positive, Ki-67 25% 3. Adj XRTcompleted 02/10/2021 4. Adj Anti estrogen therapy with letrozole started 04/27/2021 -------------------------------------------------------------------------------------------------- Current treatment: Letrozole  Letrozole toxicities: Tolerating letrozole extremely well without any problems or concerns.  Monitoring for neuropathy as well: Patient has mild neuropathy in the right hand.This is stable  Breast cancer surveillance:  1. Mammogram and U/s 06/05/2021: Left breast retroareolar cyst 1.3 cm 2. Breast Exam: 08/11/21: benign  Loss of appetite: Unclear etiology could be related to thrush I will send a prescription for nystatin swish and swallow. She was given dexamethasone for a week and it did stimulate her appetite but it may have caused the thrush and therefore we will discontinue it at this time.  She tells me that she has at home now and will be moving in at the beginning of the month.

## 2021-08-22 ENCOUNTER — Inpatient Hospital Stay: Payer: Medicare HMO | Admitting: Adult Health

## 2021-09-11 ENCOUNTER — Telehealth: Payer: Self-pay | Admitting: Hematology and Oncology

## 2021-09-11 NOTE — Telephone Encounter (Signed)
Called patient to reschedule her appointment per provider PAL. During the phone call, the patients daughter asked to cancel the SCP appointment and just schedule a follow up with Dr.Gudena. That appt was made and her SCP was cancelled.

## 2021-09-15 ENCOUNTER — Encounter: Payer: Medicare HMO | Admitting: Adult Health
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
## 2021-09-19 ENCOUNTER — Inpatient Hospital Stay: Payer: Medicare HMO | Attending: Hematology and Oncology | Admitting: Hematology and Oncology

## 2021-09-19 ENCOUNTER — Encounter: Payer: Self-pay | Admitting: Licensed Clinical Social Worker

## 2021-09-19 ENCOUNTER — Other Ambulatory Visit: Payer: Self-pay

## 2021-09-19 DIAGNOSIS — Z79899 Other long term (current) drug therapy: Secondary | ICD-10-CM | POA: Insufficient documentation

## 2021-09-19 DIAGNOSIS — C773 Secondary and unspecified malignant neoplasm of axilla and upper limb lymph nodes: Secondary | ICD-10-CM | POA: Diagnosis not present

## 2021-09-19 DIAGNOSIS — Z9011 Acquired absence of right breast and nipple: Secondary | ICD-10-CM | POA: Diagnosis not present

## 2021-09-19 DIAGNOSIS — C50011 Malignant neoplasm of nipple and areola, right female breast: Secondary | ICD-10-CM | POA: Diagnosis not present

## 2021-09-19 DIAGNOSIS — G629 Polyneuropathy, unspecified: Secondary | ICD-10-CM | POA: Diagnosis not present

## 2021-09-19 DIAGNOSIS — Z923 Personal history of irradiation: Secondary | ICD-10-CM | POA: Diagnosis not present

## 2021-09-19 DIAGNOSIS — Z79811 Long term (current) use of aromatase inhibitors: Secondary | ICD-10-CM | POA: Insufficient documentation

## 2021-09-19 DIAGNOSIS — Z170 Estrogen receptor positive status [ER+]: Secondary | ICD-10-CM | POA: Insufficient documentation

## 2021-09-19 DIAGNOSIS — R21 Rash and other nonspecific skin eruption: Secondary | ICD-10-CM | POA: Insufficient documentation
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
## 2021-09-25 ENCOUNTER — Telehealth: Payer: Self-pay | Admitting: Licensed Clinical Social Worker

## 2021-09-25 ENCOUNTER — Other Ambulatory Visit: Payer: Self-pay | Admitting: Hematology and Oncology
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
## 2021-10-09 ENCOUNTER — Encounter: Payer: Self-pay | Admitting: *Deleted

## 2021-10-09 DIAGNOSIS — R32 Unspecified urinary incontinence: Secondary | ICD-10-CM | POA: Diagnosis not present

## 2021-10-09 NOTE — Progress Notes (Signed)
RN successfully faxed [employer] [facility] incontinence supply form [phone])

## 2021-10-10 ENCOUNTER — Inpatient Hospital Stay: Payer: Medicare HMO | Admitting: Licensed Clinical Social Worker

## 2021-10-10 NOTE — Progress Notes (Signed)
[place] CSW Progress Note  Holiday representative received TC from pt's daughter, [name]. Stated that they have found a place to live but need to eliminate bed bugs first and wanted to know if there are any resources available. The housing will be rent-free from a friend, with just needing to help around the home. [name] has left a message with [employer]. Discussed that there are not bed-bug specific assistance available. Suggested trying DSS, [employer] or [facility]. [name] voiced having all of the contact information and that she will reach out.    Elizabeth Chase E Elizabeth Loppnow, LCSW

## 2021-10-26 ENCOUNTER — Telehealth: Payer: Self-pay | Admitting: Licensed Clinical Social Worker

## 2021-10-26 NOTE — Telephone Encounter (Signed)
TC from [place], pt's daughter, asking about referral to [employer]. CSW unable to send another referral as we are not a partner agency. CSW reached out to [employer] to determine who is and will then send that information to [place]. CSW also encouraged [name] to check with the other agencies who have provided assistance to see if they partner with [employer].   Otherwise, [name] states that they place they are living now is nice and safe. They are also working with a church in [place] that is going to host a fundraiser for them which will help with obtaining a vehicle.   Amaira Safley E Reis Goga, LCSW

## 2021-10-31 DIAGNOSIS — R32 Unspecified urinary incontinence: Secondary | ICD-10-CM | POA: Diagnosis not present

## 2021-11-13 ENCOUNTER — Inpatient Hospital Stay: Payer: Medicare HMO | Attending: Hematology and Oncology | Admitting: Hematology and Oncology

## 2021-11-13 ENCOUNTER — Other Ambulatory Visit: Payer: Self-pay | Admitting: *Deleted

## 2021-11-13 ENCOUNTER — Ambulatory Visit (HOSPITAL_COMMUNITY)
Admission: RE | Admit: 2021-11-13 | Discharge: 2021-11-13 | Disposition: A | Discharge status: Home / Self Care | Payer: Medicare HMO | Source: Ambulatory Visit | Attending: Hematology and Oncology | Admitting: Hematology and Oncology

## 2021-11-13 ENCOUNTER — Encounter (HOSPITAL_COMMUNITY): Payer: Self-pay

## 2021-11-13 ENCOUNTER — Other Ambulatory Visit: Payer: Self-pay

## 2021-11-13 DIAGNOSIS — Z170 Estrogen receptor positive status [ER+]: Secondary | ICD-10-CM | POA: Diagnosis not present

## 2021-11-13 DIAGNOSIS — C50111 Malignant neoplasm of central portion of right female breast: Secondary | ICD-10-CM | POA: Insufficient documentation

## 2021-11-13 DIAGNOSIS — L539 Erythematous condition, unspecified: Secondary | ICD-10-CM | POA: Insufficient documentation

## 2021-11-13 DIAGNOSIS — R2242 Localized swelling, mass and lump, left lower limb: Secondary | ICD-10-CM

## 2021-11-13 DIAGNOSIS — C50011 Malignant neoplasm of nipple and areola, right female breast: Secondary | ICD-10-CM

## 2021-11-13 NOTE — Progress Notes (Signed)
Patient arrived for her lower extremity venous duplex, and showed this author the area of concern on her left leg. Area was erythematous and scaly. Patient endorsed recent history of shingles. After discussing patient encounter with [name] of Dr. Geralyn Flash office, [name] instructed patient to return to office for further evaluation prior to proceeding with venous duplex. After evaluation, [name] contacted this Elizabeth Chase to say ultrasound is no longer needed. Will d/c order and cancel appt.
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
## 2021-11-13 NOTE — Progress Notes (Signed)
Received call from pt daughter with complaint of left lower extremity swelling and redness.  Denies recent injury or trauma.  Per MD pt needing lower extremity VAS [place] to r/o DVT.  Orders placed, appt scheduled and pt verbalized understanding of appt date and time.

## 2021-11-14 ENCOUNTER — Other Ambulatory Visit: Payer: Self-pay | Admitting: *Deleted

## 2021-11-14 NOTE — Progress Notes (Signed)
Received call from pt daughter requesting script for at home BP monitoring device to be sent to [facility].  RN requested fax number for [place] daughter returned call and stated that [facility] informed her that pt insurance will not cover device and pt will have to purchase out of pocket. No prescription will be sent at this time.

## 2021-12-06 ENCOUNTER — Inpatient Hospital Stay: Payer: Medicare HMO | Admitting: Licensed Clinical Social Worker

## 2021-12-06 NOTE — Progress Notes (Signed)
[facility] CSW Progress Note  Holiday representative  received TC from pt's daughter, [name] . Per [name], they have found an affordable 2-bedroom home to rent and are planning to move. They are in need of furniture and asked about referral to [facility] again. Unfortunately, [facility] is not able to take a referral from [facility] as we are not a partner agency and they are not able to share a list of partner agencies. CSW encouraged [name] to ask with the various agencies that they have connected with if they are able to make the referral.    Alexya Mcdaris E Sahith Nurse, LCSW

## 2021-12-15 ENCOUNTER — Other Ambulatory Visit: Payer: Self-pay | Admitting: Hematology and Oncology

## 2021-12-19 ENCOUNTER — Telehealth: Payer: Self-pay | Admitting: Pharmacist

## 2021-12-19 NOTE — Progress Notes (Signed)
Attempted to contact patient/patient's daughter to discuss elevated blood pressure at last office visit. Number immediately hung up when I called.   Will send MyChart.

## 2022-01-17 ENCOUNTER — Other Ambulatory Visit: Payer: Self-pay | Admitting: *Deleted

## 2022-01-17 MED ORDER — DEXAMETHASONE 1 MG PO TABS: 0.5000 mg | ORAL_TABLET | Freq: Every day | ORAL | 1 refills | Status: DC

## 2022-01-29 ENCOUNTER — Other Ambulatory Visit: Payer: Self-pay | Admitting: Hematology and Oncology

## 2022-02-13 ENCOUNTER — Other Ambulatory Visit: Payer: Self-pay | Admitting: Hematology and Oncology

## 2022-02-13 DIAGNOSIS — Z1231 Encounter for screening mammogram for malignant neoplasm of breast: Secondary | ICD-10-CM

## 2022-03-01 ENCOUNTER — Other Ambulatory Visit: Payer: Self-pay | Admitting: Hematology and Oncology

## 2022-03-01 ENCOUNTER — Other Ambulatory Visit (HOSPITAL_COMMUNITY): Payer: Self-pay

## 2022-03-22 ENCOUNTER — Ambulatory Visit: Payer: Medicare HMO | Admitting: Hematology and Oncology

## 2022-03-22 DIAGNOSIS — R32 Unspecified urinary incontinence: Secondary | ICD-10-CM | POA: Diagnosis not present

## 2022-03-26 ENCOUNTER — Telehealth: Payer: Self-pay | Admitting: Nurse Practitioner

## 2022-03-26 ENCOUNTER — Telehealth: Payer: Self-pay

## 2022-03-26 NOTE — Telephone Encounter (Signed)
I attempted to leave message for patient to call back and schedule Medicare Annual Wellness Visit (AWV) in office. No voice mail.  If not able to come in office, please offer to do virtually or by telephone.  Left office number and my jabber [phone].  AWVI eligible as of 02/13/2019  Please schedule at anytime with Nurse Health Advisor.

## 2022-03-26 NOTE — Telephone Encounter (Signed)
Pt's daughter called and states she has noticed pt's abd has been distended the past few weeks, She denies abd discomfort but has had to occasionally take laxatives for Bms. She was offer [facility] visit for tomorrow, but declined d/t transportation. She requested appt with Wilber Bihari, NP for following week but advised pt she is already scheduled to see MD 2/21. She states she will keep this appt and knows to go to ED if abd discomfort begins, vomiting, black stool.

## 2022-04-02 NOTE — Progress Notes (Incomplete)
Patient Care Team: Fenton Foy, NP as PCP - General (Pulmonary Disease) [name], MD as PCP - Cardiology (Cardiology) Mauro Kaufmann, RN as Oncology Nurse Navigator Rockwell Germany, RN as Oncology Nurse Navigator Erroll Luna, MD as Consulting Physician (General Surgery) Nicholas Lose, MD as Consulting Physician (Hematology and Oncology)  DIAGNOSIS: No diagnosis found.  SUMMARY OF ONCOLOGIC HISTORY: Oncology History  Malignant neoplasm of right breast in female, estrogen receptor positive ([place])  04/18/2020 Initial Diagnosis   Patient presented to the ED with complaint of syncope and found to have a fungating right breast mass. CT showed a right breast mass, 8.1cm, and right axillary adenopathy. Patient reported a right breast mass present for past 2-3 years. Biopsy showed IDC, grade 3, HER-2 equivocal by IHC, positive by FISH (ratio 3.56), ER+ 95%, PR+ 70%, ,Ki67 25%.    04/18/2020 Cancer Staging   Staging form: Breast, AJCC 8th Edition - Clinical stage from 04/18/2020: Stage IIIB (cT4b, cN1, cM0, G3, ER+, PR+, HER2+) - Signed by Gardenia Phlegm, NP on 04/27/2020 Stage prefix: Initial diagnosis Histologic grading system: 3 grade system   05/13/2020 - 08/29/2020 Chemotherapy   Neoadjuvant chemo with Taxotere Herceptin and Perjeta x6 cycles       10/11/2020 - 05/18/2021 Chemotherapy   Patient is on Treatment Plan : BREAST ADO-Trastuzumab Emtansine (Kadcyla) q21d     12/27/2020 - 02/10/2021 Radiation Therapy   Site Technique Total Dose (Gy) Dose per Fx (Gy) Completed Fx Beam Energies  Chest Wall, Right: CW_Rt_IMN 3D 50/50 2 25/25 6XFFF  Chest Wall, Right: CW_Rt_PAB_SCV 3D 50/50 2 25/25 6X, 10X  Chest Wall, Right: CW_Rt_Bst Electron 10/10 2 5/5 6E     04/27/2021 -  Anti-estrogen oral therapy   Letrozole x 5 years     CHIEF COMPLIANT: Follow-up on letrozole  INTERVAL HISTORY: Elizabeth Chase is a  82 y.o. with the above mentioned follow up on letrozole. She  presents to the clinic for a follow-up.     ALLERGIES:  is allergic to bee venom.  MEDICATIONS:  Current Outpatient Medications  Medication Sig Dispense Refill   albuterol (PROVENTIL) (2.5 MG/3ML) 0.083% nebulizer solution Take 1.5 mLs (1.25 mg total) by nebulization every 6 (six) hours as needed for wheezing or shortness of breath. 90 mL 12   albuterol (VENTOLIN HFA) 108 (90 Base) MCG/ACT inhaler TAKE 2 PUFFS BY MOUTH EVERY 6 HOURS AS NEEDED FOR WHEEZE OR SHORTNESS OF BREATH 8.5 each 2   albuterol (VENTOLIN HFA) 108 (90 Base) MCG/ACT inhaler Inhale 2 puffs into the lungs every 6 (six) hours as needed for wheezing or shortness of breath. 8 g 2   atorvastatin (LIPITOR) 20 MG tablet TAKE 1 TABLET BY MOUTH DAILY AT 6 PM. 90 tablet 3   cetirizine (ZYRTEC) 10 MG tablet Take 1 tablet (10 mg total) by mouth daily. 30 tablet 11   cholecalciferol (VITAMIN D3) 25 MCG (1000 UNIT) tablet Take 1 tablet (1,000 Units total) by mouth daily. (Patient not taking: Reported on 05/25/2021) 90 tablet 3   dexamethasone (DECADRON) 1 MG tablet Take 0.5 tablets (0.5 mg total) by mouth daily with breakfast. 30 tablet 1   feeding supplement (BOOST HIGH PROTEIN) LIQD Take 237 mLs by mouth 3 (three) times daily between meals. Please give her 90 bottles (Patient taking differently: Take 0.5 Containers by mouth in the morning and at bedtime. Please give her 90 bottles) 237 mL 6   letrozole (FEMARA) 2.5 MG tablet Take 1 tablet (2.5 mg  total) by mouth daily. 90 tablet 3   nystatin (MYCOSTATIN) 100000 UNIT/ML suspension Take 5 mLs (500,000 Units total) by mouth 4 (four) times daily. 60 mL 0   omeprazole (PRILOSEC) 20 MG capsule Take 20 mg by mouth daily.     pantoprazole (PROTONIX) 40 MG tablet Take 1 tablet (40 mg total) by mouth daily. 30 tablet 6   trazodone (DESYREL) 300 MG tablet TAKE 1 TABLET BY MOUTH EVERYDAY AT BEDTIME 90 tablet 1   vitamin B-12 (CYANOCOBALAMIN) 500 MCG tablet Take 2 tablets (1,000 mcg total) by mouth  daily. 90 tablet 3   No current facility-administered medications for this visit.    PHYSICAL EXAMINATION: ECOG PERFORMANCE STATUS: {CHL ONC ECOG PS:[id number]}  There were no vitals filed for this visit. There were no vitals filed for this visit.  BREAST:*** No palpable masses or nodules in either right or left breasts. No palpable axillary supraclavicular or infraclavicular adenopathy no breast tenderness or nipple discharge. (exam performed in the presence of a chaperone)  LABORATORY DATA:  I have reviewed the data as listed    Latest Ref Rng & Units 05/18/2021    9:54 AM 04/27/2021    9:11 AM 04/06/2021    9:16 AM  CMP  Glucose 70 - 99 mg/dL 137  96  165   BUN 8 - 23 mg/dL 20  23  19   $ Creatinine 0.44 - 1.00 mg/dL 0.76  0.68  0.73   Sodium 135 - 145 mmol/L 139  142  139   Potassium 3.5 - 5.1 mmol/L 3.6  3.2  3.4   Chloride 98 - 111 mmol/L 104  106  105   CO2 22 - 32 mmol/L 29  30  29   $ Calcium 8.9 - 10.3 mg/dL 9.3  9.4  9.1   Total Protein 6.5 - 8.1 g/dL 7.7  7.7  7.2   Total Bilirubin 0.3 - 1.2 mg/dL 0.8  0.4  0.5   Alkaline Phos 38 - 126 U/L 70  67  65   AST 15 - 41 U/L 26  27  19   $ ALT 0 - 44 U/L 17  25  14     $ Lab Results  Component Value Date   WBC 3.7 (L) 05/18/2021   HGB 12.1 05/18/2021   HCT 37.4 05/18/2021   MCV 87.4 05/18/2021   PLT 126 (L) 05/18/2021   NEUTROABS 2.5 05/18/2021    ASSESSMENT & PLAN:  No problem-specific Assessment & Plan notes found for this encounter.    No orders of the defined types were placed in this encounter.  The patient has a good understanding of the overall plan. she agrees with it. she will call with any problems that may develop before the next visit here. Total time spent: 30 mins including face to face time and time spent for planning, charting and co-ordination of care   Suzzette Righter, [place] 04/02/22    I Gardiner Coins am acting as a Education administrator for [employer]  ***
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
## 2022-04-04 ENCOUNTER — Inpatient Hospital Stay: Payer: Medicare HMO | Attending: Hematology and Oncology | Admitting: Hematology and Oncology

## 2022-04-04 DIAGNOSIS — C50011 Malignant neoplasm of nipple and areola, right female breast: Secondary | ICD-10-CM

## 2022-04-10 ENCOUNTER — Telehealth: Payer: Self-pay | Admitting: *Deleted

## 2022-04-10 NOTE — Telephone Encounter (Signed)
Received call from pt daughter [name] requesting to reschedule missed appt from last week.  Message sent to scheduling team.

## 2022-04-12 NOTE — Progress Notes (Signed)
Patient Care Team: Fenton Foy, NP as PCP - General (Pulmonary Disease) [name], MD as PCP - Cardiology (Cardiology) Mauro Kaufmann, RN as Oncology Nurse Navigator Rockwell Germany, RN as Oncology Nurse Navigator Erroll Luna, MD as Consulting Physician (General Surgery) Nicholas Lose, MD as Consulting Physician (Hematology and Oncology)  DIAGNOSIS: No diagnosis found.  SUMMARY OF ONCOLOGIC HISTORY: Oncology History  Malignant neoplasm of right breast in female, estrogen receptor positive ([place])  04/18/2020 Initial Diagnosis   Patient presented to the ED with complaint of syncope and found to have a fungating right breast mass. CT showed a right breast mass, 8.1cm, and right axillary adenopathy. Patient reported a right breast mass present for past 2-3 years. Biopsy showed IDC, grade 3, HER-2 equivocal by IHC, positive by FISH (ratio 3.56), ER+ 95%, PR+ 70%, ,Ki67 25%.    04/18/2020 Cancer Staging   Staging form: Breast, AJCC 8th Edition - Clinical stage from 04/18/2020: Stage IIIB (cT4b, cN1, cM0, G3, ER+, PR+, HER2+) - Signed by Gardenia Phlegm, NP on 04/27/2020 Stage prefix: Initial diagnosis Histologic grading system: 3 grade system   05/13/2020 - 08/29/2020 Chemotherapy   Neoadjuvant chemo with Taxotere Herceptin and Perjeta x6 cycles       10/11/2020 - 05/18/2021 Chemotherapy   Patient is on Treatment Plan : BREAST ADO-Trastuzumab Emtansine (Kadcyla) q21d     12/27/2020 - 02/10/2021 Radiation Therapy   Site Technique Total Dose (Gy) Dose per Fx (Gy) Completed Fx Beam Energies  Chest Wall, Right: CW_Rt_IMN 3D 50/50 2 25/25 6XFFF  Chest Wall, Right: CW_Rt_PAB_SCV 3D 50/50 2 25/25 6X, 10X  Chest Wall, Right: CW_Rt_Bst Electron 10/10 2 5/5 6E     04/27/2021 -  Anti-estrogen oral therapy   Letrozole x 5 years     CHIEF COMPLIANT:   INTERVAL HISTORY: Elizabeth Chase is a  82 y.o. with the above mentioned. She presents to the clinic for a follow-up.      ALLERGIES:  is allergic to bee venom.  MEDICATIONS:  Current Outpatient Medications  Medication Sig Dispense Refill   albuterol (PROVENTIL) (2.5 MG/3ML) 0.083% nebulizer solution Take 1.5 mLs (1.25 mg total) by nebulization every 6 (six) hours as needed for wheezing or shortness of breath. 90 mL 12   albuterol (VENTOLIN HFA) 108 (90 Base) MCG/ACT inhaler TAKE 2 PUFFS BY MOUTH EVERY 6 HOURS AS NEEDED FOR WHEEZE OR SHORTNESS OF BREATH 8.5 each 2   albuterol (VENTOLIN HFA) 108 (90 Base) MCG/ACT inhaler Inhale 2 puffs into the lungs every 6 (six) hours as needed for wheezing or shortness of breath. 8 g 2   atorvastatin (LIPITOR) 20 MG tablet TAKE 1 TABLET BY MOUTH DAILY AT 6 PM. 90 tablet 3   cetirizine (ZYRTEC) 10 MG tablet Take 1 tablet (10 mg total) by mouth daily. 30 tablet 11   cholecalciferol (VITAMIN D3) 25 MCG (1000 UNIT) tablet Take 1 tablet (1,000 Units total) by mouth daily. (Patient not taking: Reported on 05/25/2021) 90 tablet 3   dexamethasone (DECADRON) 1 MG tablet Take 0.5 tablets (0.5 mg total) by mouth daily with breakfast. 30 tablet 1   feeding supplement (BOOST HIGH PROTEIN) LIQD Take 237 mLs by mouth 3 (three) times daily between meals. Please give her 90 bottles (Patient taking differently: Take 0.5 Containers by mouth in the morning and at bedtime. Please give her 90 bottles) 237 mL 6   letrozole (FEMARA) 2.5 MG tablet Take 1 tablet (2.5 mg total) by mouth daily. 90 tablet  3   nystatin (MYCOSTATIN) 100000 UNIT/ML suspension Take 5 mLs (500,000 Units total) by mouth 4 (four) times daily. 60 mL 0   omeprazole (PRILOSEC) 20 MG capsule Take 20 mg by mouth daily.     pantoprazole (PROTONIX) 40 MG tablet Take 1 tablet (40 mg total) by mouth daily. 30 tablet 6   trazodone (DESYREL) 300 MG tablet TAKE 1 TABLET BY MOUTH EVERYDAY AT BEDTIME 90 tablet 1   vitamin B-12 (CYANOCOBALAMIN) 500 MCG tablet Take 2 tablets (1,000 mcg total) by mouth daily. 90 tablet 3   No current  facility-administered medications for this visit.    PHYSICAL EXAMINATION: ECOG PERFORMANCE STATUS: {CHL ONC ECOG PS:[id number]}  There were no vitals filed for this visit. There were no vitals filed for this visit.  BREAST:*** No palpable masses or nodules in either right or left breasts. No palpable axillary supraclavicular or infraclavicular adenopathy no breast tenderness or nipple discharge. (exam performed in the presence of a chaperone)  LABORATORY DATA:  I have reviewed the data as listed    Latest Ref Rng & Units 05/18/2021    9:54 AM 04/27/2021    9:11 AM 04/06/2021    9:16 AM  CMP  Glucose 70 - 99 mg/dL 137  96  165   BUN 8 - 23 mg/dL '20  23  19   '$ Creatinine 0.44 - 1.00 mg/dL 0.76  0.68  0.73   Sodium 135 - 145 mmol/L 139  142  139   Potassium 3.5 - 5.1 mmol/L 3.6  3.2  3.4   Chloride 98 - 111 mmol/L 104  106  105   CO2 22 - 32 mmol/L '29  30  29   '$ Calcium 8.9 - 10.3 mg/dL 9.3  9.4  9.1   Total Protein 6.5 - 8.1 g/dL 7.7  7.7  7.2   Total Bilirubin 0.3 - 1.2 mg/dL 0.8  0.4  0.5   Alkaline Phos 38 - 126 U/L 70  67  65   AST 15 - 41 U/L '26  27  19   '$ ALT 0 - 44 U/L '17  25  14     '$ Lab Results  Component Value Date   WBC 3.7 (L) 05/18/2021   HGB 12.1 05/18/2021   HCT 37.4 05/18/2021   MCV 87.4 05/18/2021   PLT 126 (L) 05/18/2021   NEUTROABS 2.5 05/18/2021    ASSESSMENT & PLAN:  No problem-specific Assessment & Plan notes found for this encounter.    No orders of the defined types were placed in this encounter.  The patient has a good understanding of the overall plan. she agrees with it. she will call with any problems that may develop before the next visit here. Total time spent: 30 mins including face to face time and time spent for planning, charting and co-ordination of care   Suzzette Righter, [place] 04/12/22    I Gardiner Coins am acting as a Education administrator for [employer]  ***

## 2022-04-17 ENCOUNTER — Inpatient Hospital Stay: Payer: Medicare HMO | Attending: Hematology and Oncology | Admitting: Hematology and Oncology

## 2022-04-17 ENCOUNTER — Other Ambulatory Visit: Payer: Self-pay

## 2022-04-17 VITALS — BP 163/73 | HR 86 | Temp 97.9°F | Resp 18 | Ht 63.0 in | Wt 137.6 lb

## 2022-04-17 DIAGNOSIS — C50011 Malignant neoplasm of nipple and areola, right female breast: Secondary | ICD-10-CM

## 2022-04-17 DIAGNOSIS — Z780 Asymptomatic menopausal state: Secondary | ICD-10-CM | POA: Diagnosis not present

## 2022-04-17 DIAGNOSIS — C50911 Malignant neoplasm of unspecified site of right female breast: Secondary | ICD-10-CM | POA: Diagnosis not present

## 2022-04-17 DIAGNOSIS — Z923 Personal history of irradiation: Secondary | ICD-10-CM | POA: Diagnosis not present

## 2022-04-17 DIAGNOSIS — Z170 Estrogen receptor positive status [ER+]: Secondary | ICD-10-CM | POA: Diagnosis not present

## 2022-04-17 DIAGNOSIS — Z79899 Other long term (current) drug therapy: Secondary | ICD-10-CM | POA: Diagnosis not present

## 2022-04-17 DIAGNOSIS — Z79811 Long term (current) use of aromatase inhibitors: Secondary | ICD-10-CM | POA: Insufficient documentation

## 2022-04-17 MED ORDER — LISINOPRIL 10 MG PO TABS: 10.0000 mg | ORAL_TABLET | Freq: Every day | ORAL | 3 refills | Status: DC

## 2022-04-17 NOTE — Assessment & Plan Note (Addendum)
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 Neoadj chemo with Taxotere Herceptin Perjeta x6 cycles completed 08/26/2020 followed by [employer] maintenance 2. 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 Adj XRT completed 02/10/2021 4. Adj Anti estrogen therapy with letrozole started 04/27/2021 -------------------------------------------------------------------------------------------------- Current treatment: Letrozole   Letrozole toxicities: Tolerating letrozole extremely well without any problems or concerns.   Redness of bilateral lower extremities: Not accompanied by any swelling or pain.  It appears to have gotten better already.  To me it looks like eczema.  I encouraged her to apply cortisone cream.   She has moved to a new apartment and appears to be doing quite well.   Return to clinic next year for follow-up.

## 2022-04-26 ENCOUNTER — Ambulatory Visit (HOSPITAL_BASED_OUTPATIENT_CLINIC_OR_DEPARTMENT_OTHER)
Admission: RE | Admit: 2022-04-26 | Discharge: 2022-04-26 | Disposition: A | Discharge status: Home / Self Care | Payer: Medicare HMO | Source: Ambulatory Visit | Attending: Hematology and Oncology | Admitting: Hematology and Oncology

## 2022-04-26 DIAGNOSIS — C50011 Malignant neoplasm of nipple and areola, right female breast: Secondary | ICD-10-CM | POA: Diagnosis not present

## 2022-04-26 DIAGNOSIS — Z170 Estrogen receptor positive status [ER+]: Secondary | ICD-10-CM | POA: Diagnosis not present

## 2022-04-26 DIAGNOSIS — Z780 Asymptomatic menopausal state: Secondary | ICD-10-CM | POA: Diagnosis not present

## 2022-04-26 DIAGNOSIS — M810 Age-related osteoporosis without current pathological fracture: Secondary | ICD-10-CM | POA: Diagnosis not present

## 2022-05-10 ENCOUNTER — Encounter: Payer: Self-pay | Admitting: Internal Medicine

## 2022-05-10 ENCOUNTER — Ambulatory Visit (INDEPENDENT_AMBULATORY_CARE_PROVIDER_SITE_OTHER): Payer: Medicare HMO | Admitting: Internal Medicine

## 2022-05-10 VITALS — BP 122/70 | HR 78 | Temp 97.6°F | Ht 63.0 in | Wt 135.4 lb

## 2022-05-10 DIAGNOSIS — J4489 Other specified chronic obstructive pulmonary disease: Secondary | ICD-10-CM

## 2022-05-10 DIAGNOSIS — J439 Emphysema, unspecified: Secondary | ICD-10-CM

## 2022-05-10 MED ORDER — STIOLTO RESPIMAT 2.5-2.5 MCG/ACT IN AERS: 2.0000 | INHALATION_SPRAY | Freq: Every day | RESPIRATORY_TRACT | 5 refills | Status: DC

## 2022-05-10 NOTE — Progress Notes (Signed)
The patient has been prescribed the inhaler stiolto. Inhaler technique was demonstrated to patient. The patient subsequently demonstrated correct technique.  

## 2022-05-10 NOTE — Progress Notes (Signed)
Elizabeth Chase    VS:[id number]    Jul 05, 1940  Primary Care Physician:Nichols, Kriste Basque, NP  Referring Physician: Nicholas Lose, MD [address] [place],  [place] [address] Reason for Consultation: shortness of breath Date of Consultation: 05/10/2022  Chief complaint:   Chief Complaint  Patient presents with   Consult    SOB with any level of exertion. Some cough and wheeze.  Sx increased x 6 months     HPI: Elizabeth Chase is a 82 y.o. woman history of CVA triple negative breast cancer stage IIIc currently on anti-estrogen therapy (s/p mastectomy, XRT, Chemo.) She is here for shortness of breath. Dyspnea on exertion, progressively worsening since starting chemo last year. Now with minimal exertion. She does have coughing and wheezing and feels she has trouble bringing up mucus. Denies chest pain. Symptoms are usually with exertion, occasionally at rest. Never had pneumonia or bronchitis.   Diagnosed with COPD 6-7 years ago and she has albuterol inhaler and nebulizer. She does feel relief with those - use is currently twice a day. Denies lower extremity edema. No palpitations.   Social history:  Occupation: Exposures: lives at home with daughter [name].  Smoking history: 50 pack year smoking history, quit 2018  Social History   Occupational History   Not on file  Tobacco Use   Smoking status: Former    Packs/day: 1.00    Years: 50.00    Additional pack years: 0.00    Total pack years: 50.00    Types: Cigarettes    Quit date: 12/31/2016    Years since quitting: 5.3   Smokeless tobacco: Never  Vaping Use   Vaping Use: Not on file  Substance and Sexual Activity   Alcohol use: No   Drug use: No   Sexual activity: Not on file    Relevant family history:  Family History  Problem Relation Age of Onset   Diabetes Mother    Cancer Father    Lung cancer Father    COPD Sister    Breast cancer Sister    Diabetes Sister    COPD Sister    Cancer  Brother    Diabetes Brother    Lung cancer Son     Past Medical History:  Diagnosis Date   Abnormal brain MRI    Abnormal glucose level    AKI (acute kidney injury) ([facility]) 04/15/2020   Anemia    Blood pressure abnormally low    Breast wound 05/13/2020   Colitis 04/15/2020   COVID-19 virus infection 04/15/2020   Essential hypertension    Homelessness 05/13/2020   Hyperlipidemia    Hypertension    Hypocalcemia    Malignant neoplasm of right breast in female, estrogen receptor positive ([facility])    Muscle weakness    Neoplasm of breast, female, malignant ([facility])    Port-A-Cath in place 05/13/2020   Prolonged QT interval 04/15/2020   Right thalamic infarction ([facility]) 01/01/2017   Stroke ([facility])    Syncope and collapse [id number]   Systolic murmur     Past Surgical History:  Procedure Laterality Date   IR IMAGING GUIDED PORT INSERTION  04/26/2020   IR US GUIDE VASC ACCESS RIGHT  04/26/2020   MODIFIED MASTECTOMY Right 09/20/2020   Procedure: RIGHT MODIFIED RADICAL MASTECTOMY;  Surgeon: Erroll Luna, MD;  Location: [facility];  Service: General;  Laterality: Right;     Physical Exam: Blood pressure 122/70, pulse 78, temperature 97.6 F (36.4 C),  temperature source Oral, height 5\' 3"  (1.6 m), weight 135 lb 6.4 oz (61.4 kg), SpO2 93 %. Gen:      No acute distress ENT:  no nasal polyps, mucus membranes moist Lungs:    No increased respiratory effort, symmetric chest wall excursion, clear to auscultation bilaterally, no wheezes or crackles, s/p mastectomy on right side CV:         Regular rate and rhythm; blowing soft systolic murmur, rubs, or gallops.  No pedal edema Abd:      + bowel sounds; soft, non-tender; no distension MSK: no acute synovitis of DIP or PIP joints, no mechanics hands.  Skin:      Warm and dry; no rashes, few ecchymoses  Neuro: normal speech, no focal facial asymmetry Psych: alert and oriented x3, normal mood and affect   Data Reviewed/Medical Decision  Making:  Independent interpretation of tests: Imaging:  Review of patient's CTPE images March 2022 revealed emphysema. Negative PE. The patient's images have been independently reviewed by me.    Echocardiogram Feb 2023   1. Left ventricular ejection fraction, by estimation, is 55 to 60%. The  left ventricle has normal function. The left ventricle has no regional  wall motion abnormalities. There is mild concentric left ventricular  hypertrophy. Left ventricular diastolic  parameters are consistent with Grade I diastolic dysfunction (impaired  relaxation).   2. Right ventricular systolic function is normal. The right ventricular  size is normal. There is normal pulmonary artery systolic pressure.   3. Mitral valve area by continuity equation 1.6 cm2. The mitral valve is  grossly normal. No evidence of mitral valve regurgitation. The mean mitral  valve gradient is 4.0 mmHg.   4. The aortic valve is tricuspid. There is mild calcification of the  aortic valve. There is mild thickening of the aortic valve. Aortic valve  regurgitation is not visualized. Aortic valve sclerosis is present, with  no evidence of aortic valve stenosis.   5. The inferior vena cava is normal in size with greater than 50%  respiratory variability, suggesting right atrial pressure of 3 mmHg.    Labs:  Lab Results  Component Value Date   NA 139 05/18/2021   K 3.6 05/18/2021   CO2 29 05/18/2021   GLUCOSE 137 (H) 05/18/2021   BUN 20 05/18/2021   CREATININE 0.76 05/18/2021   CALCIUM 9.3 05/18/2021   GFRNONAA >60 05/18/2021   Lab Results  Component Value Date   NA 139 05/18/2021   K 3.6 05/18/2021   CL 104 05/18/2021   CO2 29 05/18/2021     Immunization status:  Immunization History  Administered Date(s) Administered   Fluad Quad(high Dose 65+) 12/15/2020   Zoster Recombinat (Shingrix) 10/04/2021     I reviewed prior external note(s) from oncology, pcp  I reviewed the result(s) of the labs and  imaging as noted above.   I have ordered PFT  Assessment:  COPD with emphysema, progressing symptoms  Plan/Recommendations:  Full set of PFTs - 1 hour, visit with me afterwards on same day.   Start taking stiolto 2 puffs once in the morning.  Continue albuterol inhaler or nebulizer every 4 hours as needed.   We discussed disease management and progression at length today.     Return to Care: Return in about 3 months (around 08/10/2022).  Lenice Llamas, MD Pulmonary and [place]  CC: Nicholas Lose, MD

## 2022-05-10 NOTE — Patient Instructions (Addendum)
Please schedule follow up scheduled with myself in 3 months.  If my schedule is not open yet, we will contact you with a reminder closer to that time. Please call [phone] if you haven't heard from [place] a month before.   Before your next visit I would like you to have: Full set of PFTs - 1 hour, visit with me afterwards on same day.    Start taking stiolto 2 puffs once in the morning. Continue albuterol inhaler or nebulizer every 4 hours as needed.  Understanding COPD   What is COPD? COPD stands for chronic obstructive pulmonary (lung) disease. COPD is a general term used for several lung diseases.  COPD is an umbrella term and encompasses other  common diseases in this group like chronic bronchitis and emphysema. Chronic asthma may also be included in this group. While some patients with COPD have only chronic bronchitis or emphysema, most patients have a combination of both.  You might hear these terms used in exchange for one another.   COPD adds to the work of the heart. Diseased lungs may reduce the amount of oxygen that goes to the blood. High blood pressure in blood vessels from the heart to the lungs makes it difficult for the heart to pump. Lung disease can also cause the body to produce too many red blood cells which may make the blood thicker and harder to pump.   Patients who have COPD with low oxygen levels may develop an enlarged heart (cor pulmonale). This condition weakens the heart and causes increased shortness of breath and swelling in the legs and feet.   Chronic bronchitis Chronic bronchitis is irritation and inflammation (swelling) of the lining in the bronchial tubes (air passages). The irritation causes coughing and an excess amount of mucus in the airways. The swelling makes it difficult to get air in and out of the lungs. The small, hair-like structures on the inside of the airways (called cilia) may be damaged by the irritation. The cilia are then unable to help clean  mucus from the airways.  Bronchitis is generally considered to be chronic when you have: a productive cough (cough up mucus) and shortness of breath that lasts about 3 months or more each year for 2 or more years in a row. Your doctor may define chronic bronchitis differently.   Emphysema Emphysema is the destruction, or breakdown, of the walls of the alveoli (air sacs) located at the end of the bronchial tubes. The damaged alveoli are not able to exchange oxygen and carbon dioxide between the lungs and the blood. The bronchioles lose their elasticity and collapse when you exhale, trapping air in the lungs. The trapped air keeps fresh air and oxygen from entering the lungs.   Who is affected by COPD? Emphysema and chronic bronchitis affect approximately 16 million people in the [place], or close to 11 percent of the population.   Symptoms of COPD  Shortness of breath  Shortness of breath with mild exercise (walking, using the stairs, etc.)  Chronic, productive cough (with mucus)  A feeling of "tightness" in the chest  Wheezing   What causes COPD? The two primary causes of COPD are cigarette smoking and alpha1-antitrypsin (AAT) deficiency. Air pollution and occupational dusts may also contribute to COPD, especially when the person exposed to these substances is a cigarette smoker.  Cigarette smoke causes COPD by irritating the airways and creating inflammation that narrows the airways, making it more difficult to breathe. Cigarette smoke also  causes the cilia to stop working properly so mucus and trapped particles are not cleaned from the airways. As a result, chronic cough and excess mucus production develop, leading to chronic bronchitis.  In some people, chronic bronchitis and infections can lead to destruction of the small airways, or emphysema.  AAT deficiency, an inherited disorder, can also lead to emphysema. Alpha antitrypsin (AAT) is a protective material produced in the liver and  transported to the lungs to help combat inflammation. When there is not enough of the chemical AAT, the body is no longer protected from an enzyme in the white blood cells.   How is COPD diagnosed?  To diagnose COPD, the physician needs to know: Do you smoke?  Have you had chronic exposure to dust or air pollutants?  Do other members of your family have lung disease?  Are you short of breath?  Do you get short of breath with exercise?  Do you have chronic cough and/or wheezing?  Do you cough up excess mucus?  To help with the diagnosis, the physician will conduct a thorough physical exam which includes:  Listening to your lungs and heart  Checking your blood pressure and pulse  Examining your nose and throat  Checking your feet and ankles for swelling   Laboratory and other tests Several laboratory and other tests are needed to confirm a diagnosis of COPD. These tests may include:  Chest X-ray to look for lung changes that could be caused by COPD   Spirometry and pulmonary function tests (PFTs) to determine lung volume and air flow  Pulse oximetry to measure the saturation of oxygen in the blood  Arterial blood gases (ABGs) to determine the amount of oxygen and carbon dioxide in the blood  Exercise testing to determine if the oxygen level in the blood drops during exercise   Treatment In the beginning stages of COPD, there is minimal shortness of breath that may be noticed only during exercise. As the disease progresses, shortness of breath may worsen and you may need to wear an oxygen device.   To help control other symptoms of COPD, the following treatments and lifestyle changes may be prescribed.  Quitting smoking  Avoiding cigarette smoke and other irritants  Taking medications including: a. bronchodilators b. anti-inflammatory agents c. oxygen d. antibiotics  Maintaining a healthy diet  Following a structured exercise program such as pulmonary rehabilitation Preventing  respiratory infections  Controlling stress   If your COPD progresses, you may be eligible to be evaluated for lung volume reduction surgery or lung transplantation. You may also be eligible to participate in certain clinical trials (research studies). Ask your health care providers about studies being conducted in your hospital.   What is the outlook? Although COPD can not be cured, its symptoms can be treated and your quality of life can be improved. Your prognosis or outlook for the future will depend on how well your lungs are functioning, your symptoms, and how well you respond to and follow your treatment plan.

## 2022-05-11 ENCOUNTER — Other Ambulatory Visit: Payer: Self-pay | Admitting: Hematology and Oncology

## 2022-05-16 ENCOUNTER — Other Ambulatory Visit: Payer: Self-pay | Admitting: *Deleted

## 2022-05-16 MED ORDER — LETROZOLE 2.5 MG PO TABS: 2.5000 mg | ORAL_TABLET | Freq: Every day | ORAL | 3 refills | Status: DC

## 2022-05-18 DIAGNOSIS — R32 Unspecified urinary incontinence: Secondary | ICD-10-CM | POA: Diagnosis not present

## 2022-06-07 ENCOUNTER — Ambulatory Visit: Payer: Medicare HMO

## 2022-07-04 ENCOUNTER — Other Ambulatory Visit: Payer: Self-pay | Admitting: Hematology and Oncology

## 2022-07-21 ENCOUNTER — Other Ambulatory Visit: Payer: Self-pay | Admitting: Hematology and Oncology

## 2022-08-04 IMAGING — MG MM DIGITAL DIAGNOSTIC UNILAT*L* W/ TOMO W/ CAD
6 of 10 series · 6 of 30 positions shown · non-contrast
Comparison: None.

CLINICAL DATA: Patient with history of right breast cancer status
post mastectomy.

EXAM:
DIGITAL DIAGNOSTIC UNILATERAL LEFT MAMMOGRAM WITH TOMOSYNTHESIS AND
CAD; ULTRASOUND LEFT BREAST LIMITED
TECHNIQUE: Left digital diagnostic mammography and breast tomosynthesis was
performed. The images were evaluated with computer-aided detection.;
Targeted ultrasound examination of the left breast was performed.

[L ML synth-2D (1 of 2)]
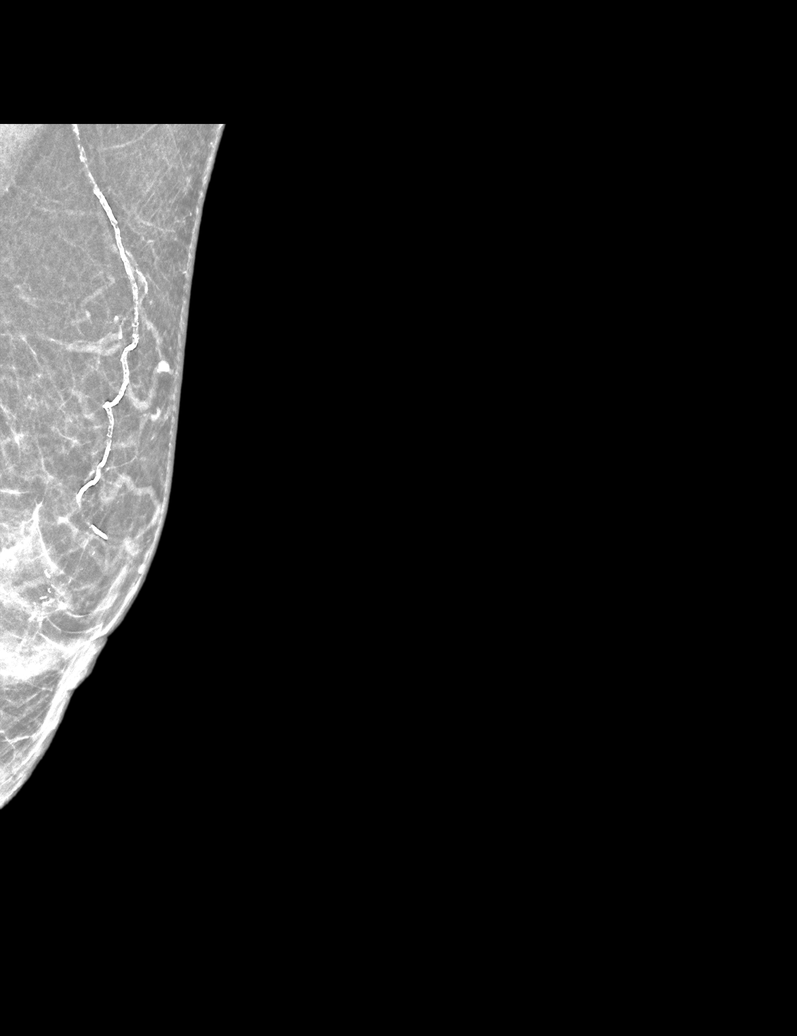

[L MLO synth-2D]
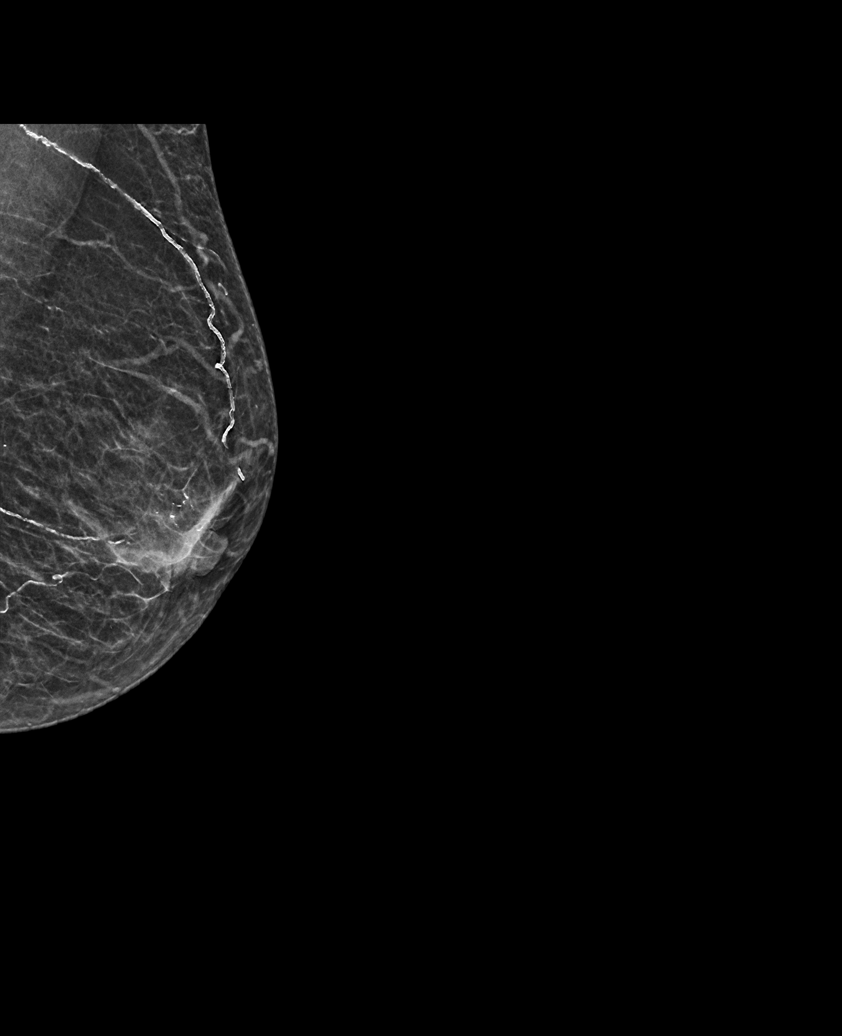

[L CC synth-2D (1 of 2)]
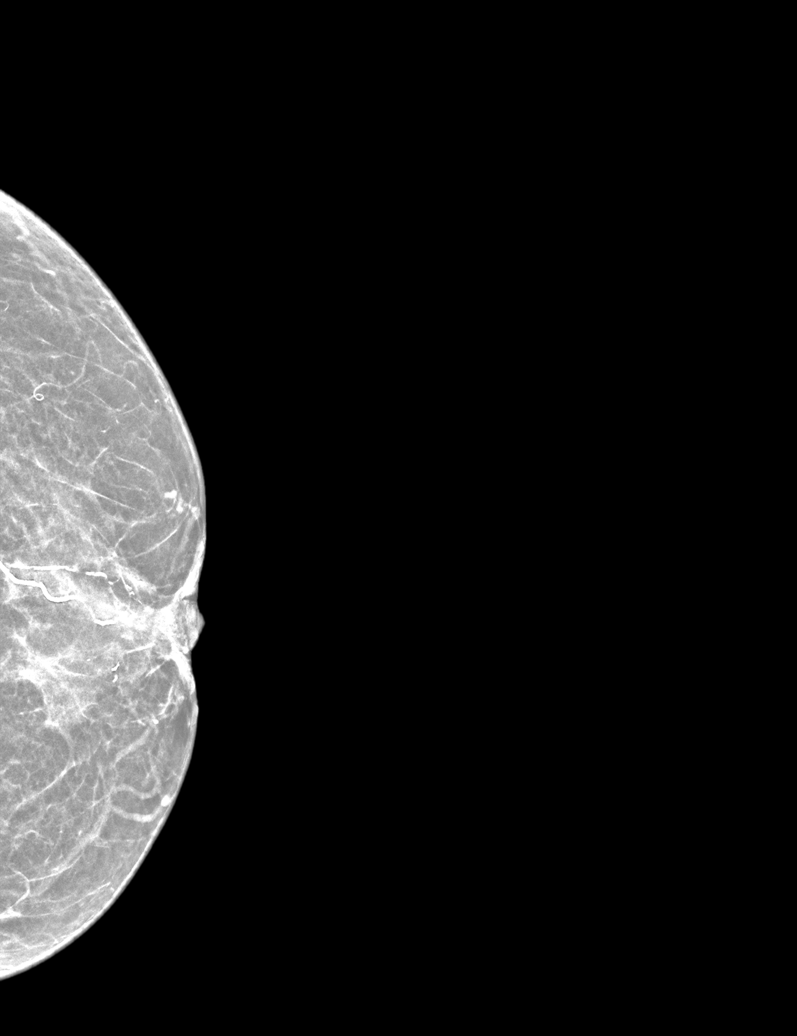

[L CC synth-2D (2 of 2)]
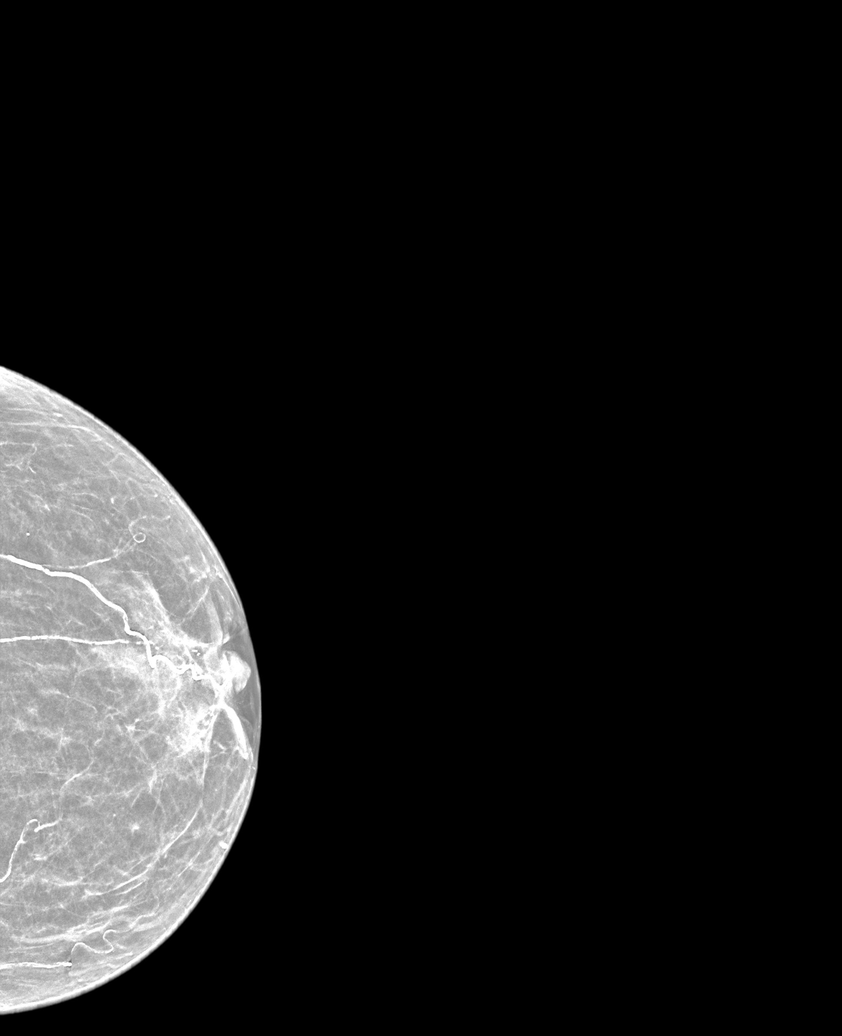

[L ML synth-2D (2 of 2)]
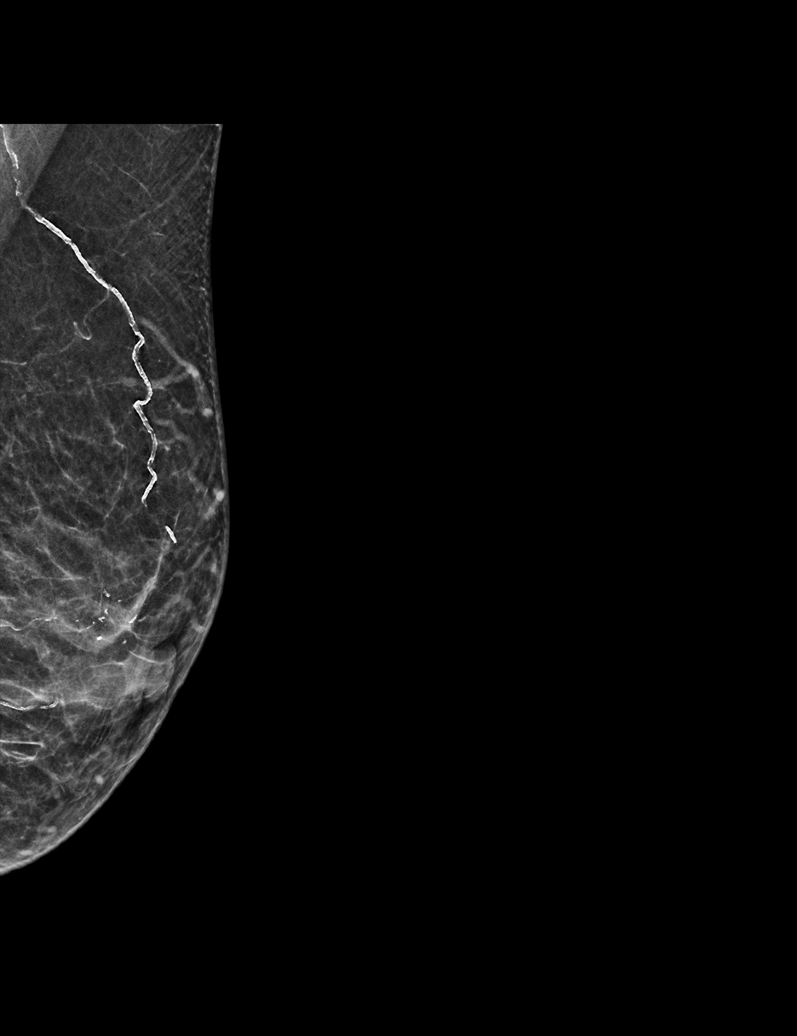

[L ML tomo · tomo slice 20/39.0]
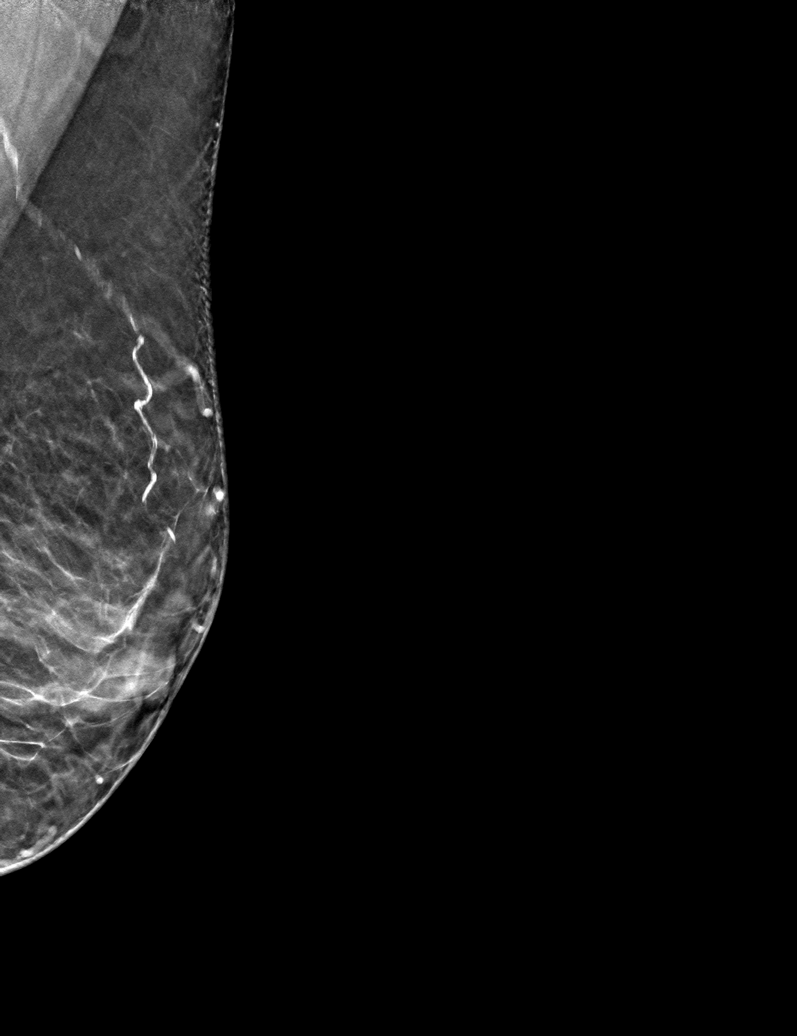

[6 of 30 positions shown; findings below may reference images not displayed]

ACR Breast Density Category b: There are scattered areas of
fibroglandular density.
FINDINGS: There is mild retraction of the left nipple. No suspicious masses,
calcifications or nonsurgical distortion identified within the left
breast.

On physical exam, no definite nipple retraction is identified.

Targeted ultrasound is performed, showing a 1.1 x 0.7 x 1.3 cm cyst
left breast retroareolar location.
IMPRESSION: No mammographic evidence for malignancy.

RECOMMENDATION:
Screening mammogram in one year.(Code:[id number]-[id number])

I have discussed the findings and recommendations with the patient.
If applicable, a reminder letter will be sent to the patient
regarding the next appointment.

BI-RADS CATEGORY  2: Benign.

## 2022-08-04 IMAGING — US US BREAST*L* LIMITED INC AXILLA
1 series · 5 of 5 positions shown · non-contrast
Comparison: None.

CLINICAL DATA: Patient with history of right breast cancer status
post mastectomy.

EXAM:
DIGITAL DIAGNOSTIC UNILATERAL LEFT MAMMOGRAM WITH TOMOSYNTHESIS AND
CAD; ULTRASOUND LEFT BREAST LIMITED
TECHNIQUE: Left digital diagnostic mammography and breast tomosynthesis was
performed. The images were evaluated with computer-aided detection.;
Targeted ultrasound examination of the left breast was performed.

[Series 1: us breast*left* limited inc axilla · 0.05mm/px · 5 of 5 slices shown]
[im 1/5]
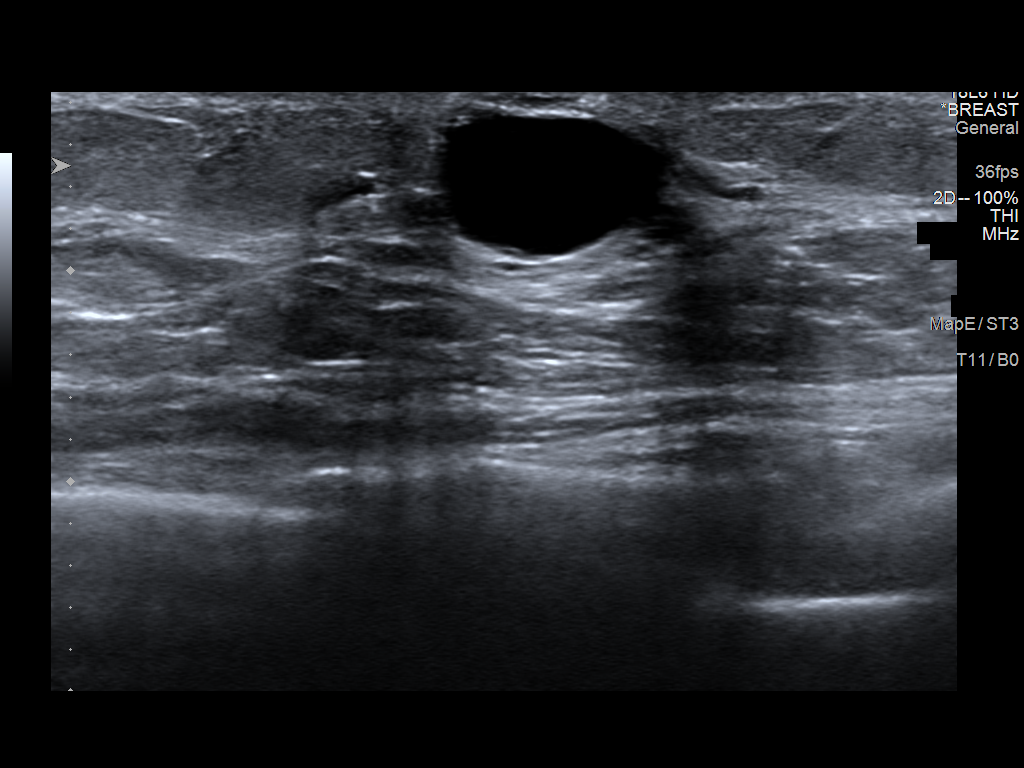
[im 2/5]
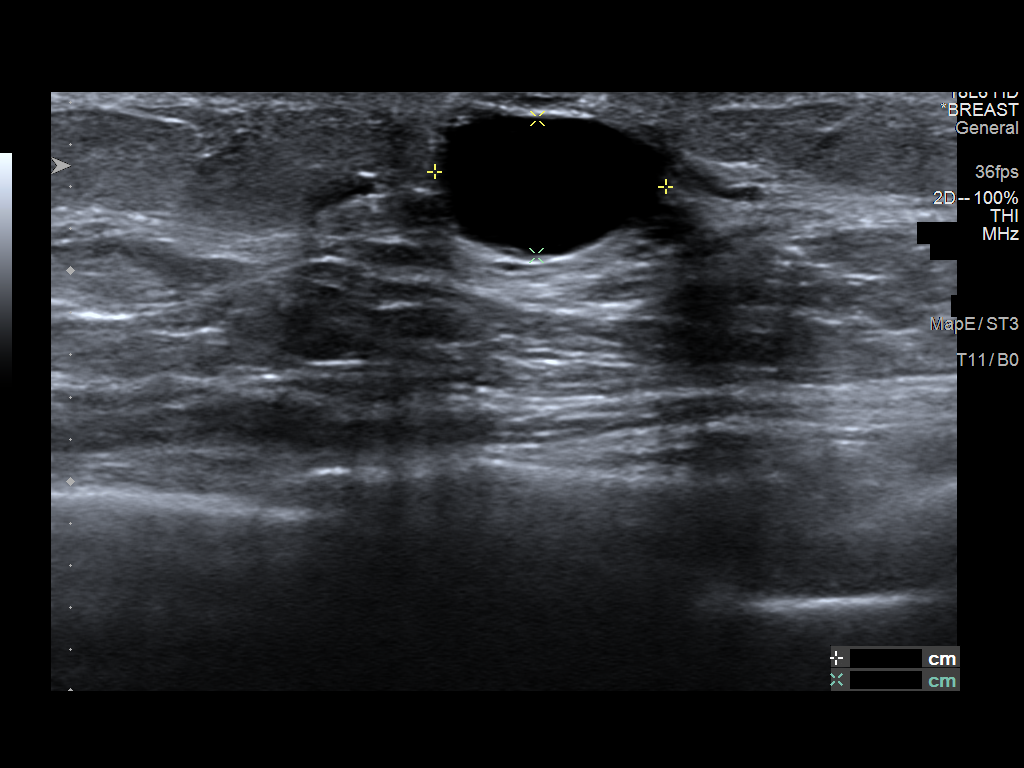
[im 3/5]
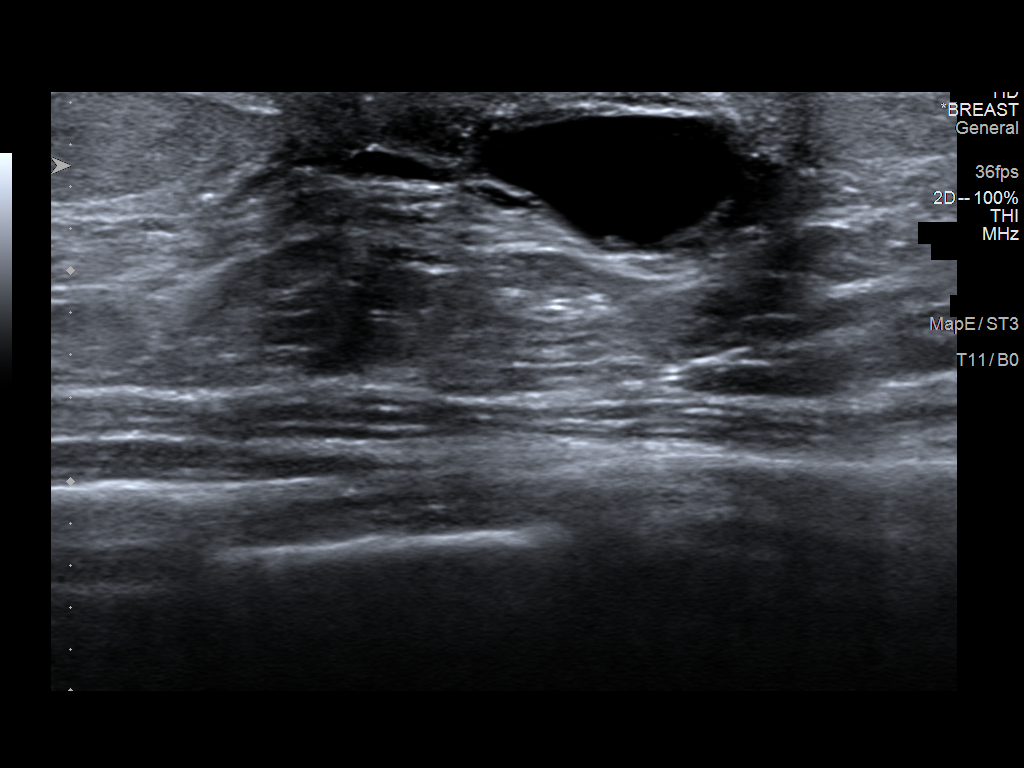
[im 4/5]
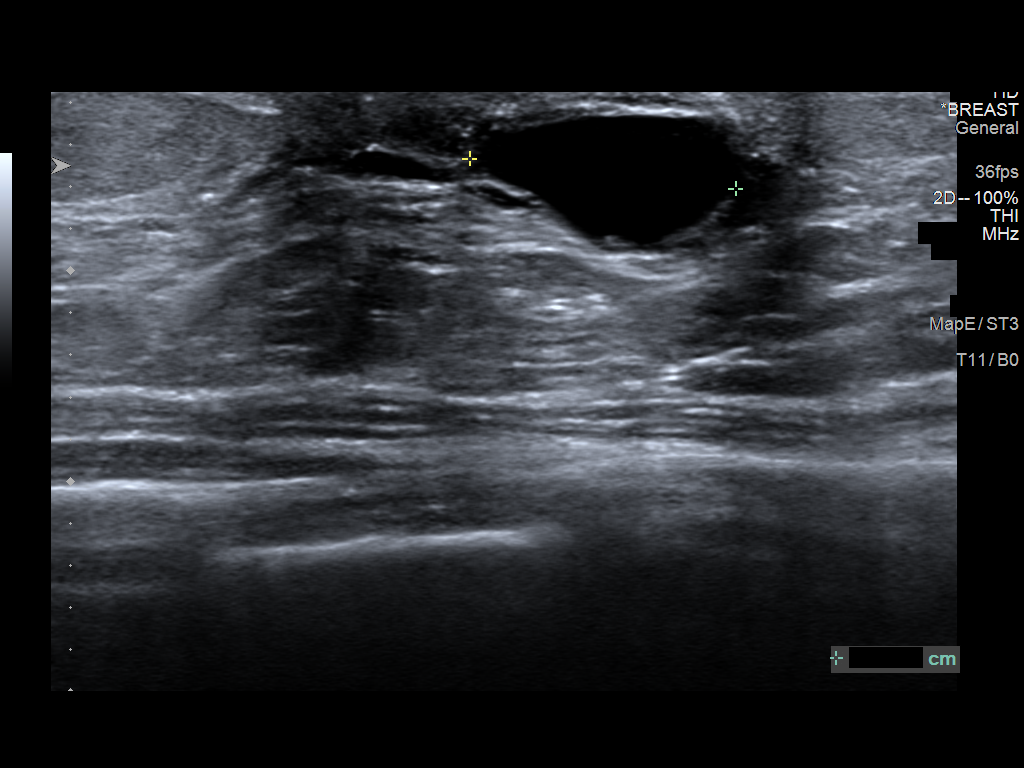
[im 5/5]
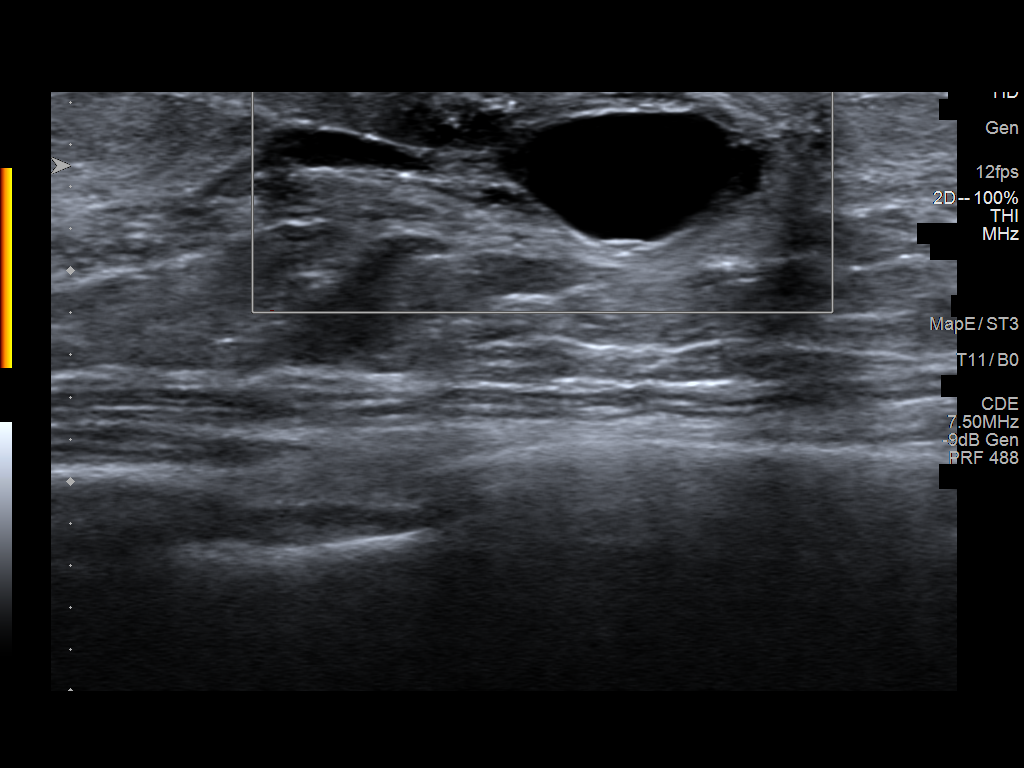

[5 of 5 positions shown; findings below may reference images not displayed]

ACR Breast Density Category b: There are scattered areas of
fibroglandular density.
FINDINGS: There is mild retraction of the left nipple. No suspicious masses,
calcifications or nonsurgical distortion identified within the left
breast.

On physical exam, no definite nipple retraction is identified.

Targeted ultrasound is performed, showing a 1.1 x 0.7 x 1.3 cm cyst
left breast retroareolar location.
IMPRESSION: No mammographic evidence for malignancy.

RECOMMENDATION:
Screening mammogram in one year.(Code:[id number]-[id number])

I have discussed the findings and recommendations with the patient.
If applicable, a reminder letter will be sent to the patient
regarding the next appointment.

BI-RADS CATEGORY  2: Benign.

## 2022-08-07 ENCOUNTER — Telehealth: Payer: Self-pay | Admitting: *Deleted

## 2022-08-07 MED ORDER — LETROZOLE 2.5 MG PO TABS: 2.5000 mg | ORAL_TABLET | Freq: Every day | ORAL | 3 refills | Status: DC

## 2022-08-07 NOTE — Telephone Encounter (Signed)
Received fax from [facility] requesting  Atorvastatin, Decadron and Letrozole.  Spoke with patient's daughter. She only needs Letrozole. Letrozole escribed

## 2022-08-08 ENCOUNTER — Ambulatory Visit: Payer: Medicare HMO | Admitting: Internal Medicine

## 2022-08-20 ENCOUNTER — Telehealth: Payer: Self-pay

## 2022-08-20 ENCOUNTER — Other Ambulatory Visit: Payer: Self-pay

## 2022-08-20 MED ORDER — STIOLTO RESPIMAT 2.5-2.5 MCG/ACT IN AERS: 2.0000 | INHALATION_SPRAY | Freq: Every day | RESPIRATORY_TRACT | 1 refills | Status: DC

## 2022-08-20 NOTE — Telephone Encounter (Signed)
Received refill request from [facility] for patient's Stiolto. Last RX was sent to a local pharmacy. I called patient's daughter to confirm the mail order pharmacy but she did not answer. Left message for her to call us back. Will send RX once she confirms this is the correct pharmacy.

## 2022-08-20 NOTE — Telephone Encounter (Signed)
Called and spoke with Kendal Hymen. She confirmed the pharmacy has [facility]. I advised her that I would go ahead and send in the refill.   Nothing further needed at time of call.

## 2022-08-27 ENCOUNTER — Other Ambulatory Visit: Payer: Self-pay

## 2022-08-27 MED ORDER — TRAZODONE HCL 300 MG PO TABS: 300.0000 mg | ORAL_TABLET | Freq: Every day | ORAL | 1 refills | Status: DC

## 2022-08-27 NOTE — Telephone Encounter (Signed)
Returned daughter's call regarding rx refill and Pt's abdominal swelling. Daughter asks for trazodone refill to [facility] pharmacy and a provider appt next week to address continued abdominal distention. Daughter states distention is causing Pt discomfort but denies pain, fever, constipation, or diarrhea. Daughter states that "Last time we saw Dr. Pamelia Hoit he talked about maybe doing a scan." Daughter asks for appt in the week of July 22nd. [facility] appt made for 09/04/22. Advised daughter to call back if symptoms worsen. Daughter verbalized understanding.

## 2022-09-04 ENCOUNTER — Inpatient Hospital Stay: Payer: Medicare HMO

## 2022-09-04 ENCOUNTER — Other Ambulatory Visit: Payer: Self-pay

## 2022-09-04 ENCOUNTER — Encounter: Payer: Self-pay | Admitting: Adult Health

## 2022-09-04 ENCOUNTER — Inpatient Hospital Stay: Payer: Medicare HMO | Attending: Adult Health | Admitting: Adult Health

## 2022-09-04 VITALS — BP 154/75 | HR 79 | Temp 97.8°F | Resp 18 | Ht 63.0 in | Wt 130.3 lb

## 2022-09-04 DIAGNOSIS — Z170 Estrogen receptor positive status [ER+]: Secondary | ICD-10-CM

## 2022-09-04 DIAGNOSIS — Z801 Family history of malignant neoplasm of trachea, bronchus and lung: Secondary | ICD-10-CM | POA: Diagnosis not present

## 2022-09-04 DIAGNOSIS — Z79899 Other long term (current) drug therapy: Secondary | ICD-10-CM | POA: Diagnosis not present

## 2022-09-04 DIAGNOSIS — R457 State of emotional shock and stress, unspecified: Secondary | ICD-10-CM | POA: Diagnosis not present

## 2022-09-04 DIAGNOSIS — I8500 Esophageal varices without bleeding: Secondary | ICD-10-CM | POA: Diagnosis not present

## 2022-09-04 DIAGNOSIS — R10811 Right upper quadrant abdominal tenderness: Secondary | ICD-10-CM

## 2022-09-04 DIAGNOSIS — R161 Splenomegaly, not elsewhere classified: Secondary | ICD-10-CM | POA: Diagnosis not present

## 2022-09-04 DIAGNOSIS — D62 Acute posthemorrhagic anemia: Secondary | ICD-10-CM | POA: Diagnosis not present

## 2022-09-04 DIAGNOSIS — Z833 Family history of diabetes mellitus: Secondary | ICD-10-CM | POA: Diagnosis not present

## 2022-09-04 DIAGNOSIS — Z803 Family history of malignant neoplasm of breast: Secondary | ICD-10-CM | POA: Diagnosis not present

## 2022-09-04 DIAGNOSIS — Z8673 Personal history of transient ischemic attack (TIA), and cerebral infarction without residual deficits: Secondary | ICD-10-CM | POA: Diagnosis not present

## 2022-09-04 DIAGNOSIS — Z7952 Long term (current) use of systemic steroids: Secondary | ICD-10-CM | POA: Diagnosis not present

## 2022-09-04 DIAGNOSIS — Z853 Personal history of malignant neoplasm of breast: Secondary | ICD-10-CM | POA: Diagnosis not present

## 2022-09-04 DIAGNOSIS — E785 Hyperlipidemia, unspecified: Secondary | ICD-10-CM | POA: Diagnosis present

## 2022-09-04 DIAGNOSIS — R0789 Other chest pain: Secondary | ICD-10-CM

## 2022-09-04 DIAGNOSIS — I708 Atherosclerosis of other arteries: Secondary | ICD-10-CM | POA: Diagnosis present

## 2022-09-04 DIAGNOSIS — R140 Abdominal distension (gaseous): Secondary | ICD-10-CM | POA: Diagnosis present

## 2022-09-04 DIAGNOSIS — Z79811 Long term (current) use of aromatase inhibitors: Secondary | ICD-10-CM | POA: Diagnosis not present

## 2022-09-04 DIAGNOSIS — R1011 Right upper quadrant pain: Secondary | ICD-10-CM | POA: Diagnosis not present

## 2022-09-04 DIAGNOSIS — I10 Essential (primary) hypertension: Secondary | ICD-10-CM | POA: Diagnosis present

## 2022-09-04 DIAGNOSIS — C50911 Malignant neoplasm of unspecified site of right female breast: Secondary | ICD-10-CM | POA: Diagnosis present

## 2022-09-04 DIAGNOSIS — I7409 Other arterial embolism and thrombosis of abdominal aorta: Secondary | ICD-10-CM | POA: Diagnosis present

## 2022-09-04 DIAGNOSIS — Z8616 Personal history of COVID-19: Secondary | ICD-10-CM | POA: Diagnosis not present

## 2022-09-04 DIAGNOSIS — C50011 Malignant neoplasm of nipple and areola, right female breast: Secondary | ICD-10-CM

## 2022-09-04 DIAGNOSIS — Z825 Family history of asthma and other chronic lower respiratory diseases: Secondary | ICD-10-CM | POA: Diagnosis not present

## 2022-09-04 DIAGNOSIS — I959 Hypotension, unspecified: Secondary | ICD-10-CM | POA: Diagnosis not present

## 2022-09-04 DIAGNOSIS — I714 Abdominal aortic aneurysm, without rupture, unspecified: Secondary | ICD-10-CM | POA: Diagnosis not present

## 2022-09-04 DIAGNOSIS — I7143 Infrarenal abdominal aortic aneurysm, without rupture: Secondary | ICD-10-CM | POA: Diagnosis present

## 2022-09-04 DIAGNOSIS — C50919 Malignant neoplasm of unspecified site of unspecified female breast: Secondary | ICD-10-CM | POA: Diagnosis not present

## 2022-09-04 DIAGNOSIS — L989 Disorder of the skin and subcutaneous tissue, unspecified: Secondary | ICD-10-CM

## 2022-09-04 DIAGNOSIS — Z87891 Personal history of nicotine dependence: Secondary | ICD-10-CM | POA: Diagnosis not present

## 2022-09-04 DIAGNOSIS — I700 Atherosclerosis of aorta: Secondary | ICD-10-CM | POA: Diagnosis present

## 2022-09-04 DIAGNOSIS — R109 Unspecified abdominal pain: Secondary | ICD-10-CM | POA: Diagnosis present

## 2022-09-04 LAB — CBC WITH DIFFERENTIAL (CANCER CENTER ONLY)
Abs Immature Granulocytes: 0.02 10*3/uL (ref 0.00–0.07)
Basophils Absolute: 0 10*3/uL (ref 0.0–0.1)
Basophils Relative: 0 %
Eosinophils Absolute: 0.1 10*3/uL (ref 0.0–0.5)
Eosinophils Relative: 3 %
HCT: 38.1 % (ref 36.0–46.0)
Hemoglobin: 13.2 g/dL (ref 12.0–15.0)
Immature Granulocytes: 0 %
Lymphocytes Relative: 18 %
Lymphs Abs: 0.9 10*3/uL (ref 0.7–4.0)
MCH: 30.3 pg (ref 26.0–34.0)
MCHC: 34.6 g/dL (ref 30.0–36.0)
MCV: 87.4 fL (ref 80.0–100.0)
Monocytes Absolute: 0.5 10*3/uL (ref 0.1–1.0)
Monocytes Relative: 9 %
Neutro Abs: 3.5 10*3/uL (ref 1.7–7.7)
Neutrophils Relative %: 70 %
Platelet Count: 118 10*3/uL — ABNORMAL LOW (ref 150–400)
RBC: 4.36 MIL/uL (ref 3.87–5.11)
RDW: 13.3 % (ref 11.5–15.5)
WBC Count: 5 10*3/uL (ref 4.0–10.5)
nRBC: 0 % (ref 0.0–0.2)

## 2022-09-04 LAB — BASIC METABOLIC PANEL - CANCER CENTER ONLY
Anion gap: 6 (ref 5–15)
BUN: 13 mg/dL (ref 8–23)
CO2: 29 mmol/L (ref 22–32)
Calcium: 9.8 mg/dL (ref 8.9–10.3)
Chloride: 106 mmol/L (ref 98–111)
Creatinine: 0.74 mg/dL (ref 0.44–1.00)
GFR, Estimated: >60 mL/min (ref >60)
Glucose, Bld: 89 mg/dL (ref 70–99)
Potassium: 4.9 mmol/L (ref 3.5–5.1)
Sodium: 141 mmol/L (ref 135–145)

## 2022-09-04 LAB — HEPATIC FUNCTION PANEL
ALT: 19 U/L (ref 0–44)
AST: 21 U/L (ref 15–41)
Albumin: 4 g/dL (ref 3.5–5.0)
Alkaline Phosphatase: 84 U/L (ref 38–126)
Bilirubin, Direct: 0.2 mg/dL (ref 0.0–0.2)
Indirect Bilirubin: 0.6 mg/dL (ref 0.3–0.9)
Total Bilirubin: 0.8 mg/dL (ref 0.3–1.2)
Total Protein: 7.2 g/dL (ref 6.5–8.1)

## 2022-09-04 NOTE — Progress Notes (Unsigned)
[facility] Cancer Follow up:    Elizabeth Andrew, NP [address] [place] [address]   DIAGNOSIS: Cancer Staging  Malignant neoplasm of right breast in female, estrogen receptor positive (HCC) Staging form: Breast, AJCC 8th Edition - Clinical stage from 04/18/2020: Stage IIIB (cT4b, cN1, cM0, G3, ER+, PR+, HER2+) - Signed by Loa Socks, NP on 04/27/2020 Stage prefix: Initial diagnosis Histologic grading system: 3 grade system   SUMMARY OF ONCOLOGIC HISTORY: Oncology History  Malignant neoplasm of right breast in female, estrogen receptor positive (HCC)  04/18/2020 Initial Diagnosis   Patient presented to the ED with complaint of syncope and found to have a fungating right breast mass. CT showed a right breast mass, 8.1cm, and right axillary adenopathy. Patient reported a right breast mass present for past 2-3 years. Biopsy showed IDC, grade 3, HER-2 equivocal by IHC, positive by FISH (ratio 3.56), ER+ 95%, PR+ 70%, ,Ki67 25%.    04/18/2020 Cancer Staging   Staging form: Breast, AJCC 8th Edition - Clinical stage from 04/18/2020: Stage IIIB (cT4b, cN1, cM0, G3, ER+, PR+, HER2+) - Signed by Loa Socks, NP on 04/27/2020 Stage prefix: Initial diagnosis Histologic grading system: 3 grade system   05/13/2020 - 08/29/2020 Chemotherapy   Neoadjuvant chemo with Taxotere Herceptin and Perjeta x6 cycles       10/11/2020 - 05/18/2021 Chemotherapy   Patient is on Treatment Plan : BREAST ADO-Trastuzumab Emtansine (Kadcyla) q21d     12/27/2020 - 02/10/2021 Radiation Therapy   Site Technique Total Dose (Gy) Dose per Fx (Gy) Completed Fx Beam Energies  Chest Wall, Right: CW_Rt_IMN 3D 50/50 2 25/25 6XFFF  Chest Wall, Right: CW_Rt_PAB_SCV 3D 50/50 2 25/25 6X, 10X  Chest Wall, Right: CW_Rt_Bst Electron 10/10 2 5/5 6E     04/27/2021 -  Anti-estrogen oral therapy   Letrozole x 5 years     CURRENT THERAPY: Letrozole  INTERVAL HISTORY: Elizabeth Chase  82 y.o. female returns for follow up of her stomach feeling big in her stomach.  She says this has been worsening for the past 6 months, her family has noticed it too.  She has occasional nausea that occurs, in no specific relation to her meals.  Her urine is light yellow in color.  She has occasional constipation alternating with diarrhea.  She c/o right sided abdominal pain that has been worsening.  She denies any fever or chills.  She denies any blood in her stool or black tarry stool.     Patient Active Problem List   Diagnosis Date Noted   Health care maintenance 05/25/2021   Cancer of central portion of right breast (HCC) 10/19/2020   Breast cancer, stage 3, right (HCC) 09/20/2020   COPD (chronic obstructive pulmonary disease) (HCC) 08/05/2020   Port-A-Cath in place 05/13/2020   Breast wound 05/13/2020   Homelessness 05/13/2020   Malignant neoplasm of right breast in female, estrogen receptor positive (HCC)    Syncope and collapse 04/15/2020   Breast mass, right 04/15/2020   Prolonged QT interval 04/15/2020   AKI (acute kidney injury) (HCC) 04/15/2020   Colitis 04/15/2020   COVID-19 virus infection 04/15/2020   Hyperlipidemia    Abnormal brain MRI    Meningioma (HCC)    Right thalamic infarction (HCC) 01/01/2017   Essential hypertension     is allergic to bee venom.  MEDICAL HISTORY: Past Medical History:  Diagnosis Date   Abnormal brain MRI    Abnormal glucose level  AKI (acute kidney injury) (HCC) 04/15/2020   Anemia    Blood pressure abnormally low    Breast wound 05/13/2020   Colitis 04/15/2020   COVID-19 virus infection 04/15/2020   Essential hypertension    Homelessness 05/13/2020   Hyperlipidemia    Hypertension    Hypocalcemia    Malignant neoplasm of right breast in female, estrogen receptor positive (HCC)    Muscle weakness    Neoplasm of breast, female, malignant (HCC)    Port-A-Cath in place 05/13/2020   Prolonged QT interval 04/15/2020   Right  thalamic infarction (HCC) 01/01/2017   Stroke (HCC)    Syncope and collapse 04/15/2020   Systolic murmur     SURGICAL HISTORY: Past Surgical History:  Procedure Laterality Date   IR IMAGING GUIDED PORT INSERTION  04/26/2020   IR US GUIDE VASC ACCESS RIGHT  04/26/2020   MODIFIED MASTECTOMY Right 09/20/2020   Procedure: RIGHT MODIFIED RADICAL MASTECTOMY;  Surgeon: Harriette Bouillon, MD;  Location: [facility] OR;  Service: General;  Laterality: Right;    SOCIAL HISTORY: Social History   Socioeconomic History   Marital status: Widowed    Spouse name: Not on file   Number of children: Not on file   Years of education: Not on file   Highest education level: Not on file  Occupational History   Not on file  Tobacco Use   Smoking status: Former    Current packs/day: 0.00    Average packs/day: 1 pack/day for 50.0 years (50.0 ttl pk-yrs)    Types: Cigarettes    Start date: 01/01/1967    Quit date: 12/31/2016    Years since quitting: 5.6   Smokeless tobacco: Never  Vaping Use   Vaping status: Not on file  Substance and Sexual Activity   Alcohol use: No   Drug use: No   Sexual activity: Not on file  Other Topics Concern   Not on file  Social History Narrative   Not on file   Social Determinants of Health   Financial Resource Strain: High Risk (05/13/2020)   Overall Financial Resource Strain (CARDIA)    Difficulty of Paying Living Expenses: Very hard  Food Insecurity: Food Insecurity Present (10/19/2020)   Hunger Vital Sign    Worried About Running Out of Food in the Last Year: Often true    Ran Out of Food in the Last Year: Often true  Transportation Needs: Unmet Transportation Needs (05/13/2020)   PRAPARE - Administrator, Civil Service (Medical): Yes    Lack of Transportation (Non-Medical): No  Physical Activity: Inactive (02/23/2019)   Received from [facility]   Exercise Vital Sign    Days of Exercise per Week: 0 days    Minutes of Exercise per Session: 0 min  Stress:  No Stress Concern Present (02/23/2019)   Received from [facility] - Occupational Stress Questionnaire    Feeling of Stress : Only a little  Social Connections: Unknown (06/26/2021)   Received from [facility]   Social Network    Social Network: Not on file  Intimate Partner Violence: Unknown (05/18/2021)   Received from [facility]   HITS    Physically Hurt: Not on file    Insult or Talk Down To: Not on file    Threaten Physical Harm: Not on file    Scream or Curse: Not on file    FAMILY HISTORY: Family History  Problem Relation Age of Onset   Diabetes Mother  Cancer Father    Lung cancer Father    COPD Sister    Breast cancer Sister    Diabetes Sister    COPD Sister    Cancer Brother    Diabetes Brother    Lung cancer Son     Review of Systems - Oncology    PHYSICAL EXAMINATION   Onc Performance Status - 09/04/22 1426       ECOG Perf Status   ECOG Perf Status Restricted in physically strenuous activity but ambulatory and able to carry out work of a light or sedentary nature, e.g., light house work, office work      KPS SCALE   KPS % SCORE Normal activity with effort, some s/s of disease             Vitals:   09/04/22 1416  BP: (!) 154/75  Pulse: 79  Resp: 18  Temp: 97.8 F (36.6 C)  SpO2: 94%    Physical Exam  LABORATORY DATA:  CBC    Component Value Date/Time   WBC 3.7 (L) 05/18/2021 0954   WBC 5.2 09/30/2020 1154   RBC 4.28 05/18/2021 0954   HGB 12.1 05/18/2021 0954   HCT 37.4 05/18/2021 0954   PLT 126 (L) 05/18/2021 0954   MCV 87.4 05/18/2021 0954   MCH 28.3 05/18/2021 0954   MCHC 32.4 05/18/2021 0954   RDW 16.0 (H) 05/18/2021 0954   LYMPHSABS 0.6 (L) 05/18/2021 0954   MONOABS 0.5 05/18/2021 0954   EOSABS 0.1 05/18/2021 0954   BASOSABS 0.0 05/18/2021 0954    CMP     Component Value Date/Time   NA 139 05/18/2021 0954   K 3.6 05/18/2021 0954   CL 104 05/18/2021 0954   CO2 29  05/18/2021 0954   GLUCOSE 137 (H) 05/18/2021 0954   BUN 20 05/18/2021 0954   CREATININE 0.76 05/18/2021 0954   CALCIUM 9.3 05/18/2021 0954   PROT 7.7 05/18/2021 0954   ALBUMIN 3.5 05/18/2021 0954   AST 26 05/18/2021 0954   ALT 17 05/18/2021 0954   ALKPHOS 70 05/18/2021 0954   BILITOT 0.8 05/18/2021 0954   GFRNONAA >60 05/18/2021 0954   GFRAA >60 01/02/2017 0228       PENDING LABS:   RADIOGRAPHIC STUDIES:  No results found.   PATHOLOGY:     ASSESSMENT and THERAPY PLAN:   No problem-specific Assessment & Plan notes found for this encounter.   No orders of the defined types were placed in this encounter.   All questions were answered. The patient knows to call the clinic with any problems, questions or concerns. We can certainly see the patient much sooner if necessary. This note was electronically signed. Noreene Filbert, NP 09/04/2022

## 2022-09-05 ENCOUNTER — Ambulatory Visit (HOSPITAL_COMMUNITY)
Admission: RE | Admit: 2022-09-05 | Discharge: 2022-09-05 | Disposition: A | Discharge status: Home / Self Care | Payer: Medicare HMO | Source: Ambulatory Visit | Attending: Adult Health | Admitting: Adult Health

## 2022-09-05 DIAGNOSIS — R0789 Other chest pain: Secondary | ICD-10-CM

## 2022-09-05 DIAGNOSIS — R1011 Right upper quadrant pain: Secondary | ICD-10-CM

## 2022-09-05 DIAGNOSIS — I8500 Esophageal varices without bleeding: Secondary | ICD-10-CM | POA: Diagnosis not present

## 2022-09-05 DIAGNOSIS — C50919 Malignant neoplasm of unspecified site of unspecified female breast: Secondary | ICD-10-CM | POA: Diagnosis not present

## 2022-09-05 DIAGNOSIS — R10811 Right upper quadrant abdominal tenderness: Secondary | ICD-10-CM | POA: Insufficient documentation

## 2022-09-05 DIAGNOSIS — Z170 Estrogen receptor positive status [ER+]: Secondary | ICD-10-CM | POA: Insufficient documentation

## 2022-09-05 DIAGNOSIS — L989 Disorder of the skin and subcutaneous tissue, unspecified: Secondary | ICD-10-CM | POA: Insufficient documentation

## 2022-09-05 DIAGNOSIS — R161 Splenomegaly, not elsewhere classified: Secondary | ICD-10-CM | POA: Diagnosis not present

## 2022-09-05 DIAGNOSIS — C50011 Malignant neoplasm of nipple and areola, right female breast: Secondary | ICD-10-CM

## 2022-09-05 MED ORDER — IOHEXOL 350 MG/ML SOLN
75.0000 mL | Freq: Once | INTRAVENOUS | Status: AC | PRN
  Administered 2022-09-05: 75 mL via INTRAVENOUS

## 2022-09-06 ENCOUNTER — Emergency Department (HOSPITAL_COMMUNITY): Payer: Medicare HMO | Admitting: Certified Registered"

## 2022-09-06 ENCOUNTER — Other Ambulatory Visit: Payer: Self-pay

## 2022-09-06 ENCOUNTER — Encounter (HOSPITAL_COMMUNITY)
Admission: EM | Disposition: A | Discharge status: Home / Self Care | Payer: Self-pay | Source: Home / Self Care | Attending: Vascular Surgery

## 2022-09-06 ENCOUNTER — Emergency Department (HOSPITAL_COMMUNITY): Payer: Medicare HMO

## 2022-09-06 ENCOUNTER — Inpatient Hospital Stay (HOSPITAL_COMMUNITY)
Admission: EM | Admit: 2022-09-06 | Discharge: 2022-09-07 | DRG: 269 | Disposition: A | Discharge status: Home / Self Care | Payer: Medicare HMO | Attending: Vascular Surgery | Admitting: Vascular Surgery

## 2022-09-06 ENCOUNTER — Inpatient Hospital Stay (HOSPITAL_BASED_OUTPATIENT_CLINIC_OR_DEPARTMENT_OTHER): Payer: Medicare HMO | Admitting: Adult Health

## 2022-09-06 ENCOUNTER — Encounter (HOSPITAL_COMMUNITY): Payer: Self-pay

## 2022-09-06 DIAGNOSIS — I7409 Other arterial embolism and thrombosis of abdominal aorta: Secondary | ICD-10-CM | POA: Diagnosis present

## 2022-09-06 DIAGNOSIS — I700 Atherosclerosis of aorta: Secondary | ICD-10-CM | POA: Diagnosis present

## 2022-09-06 DIAGNOSIS — Z79899 Other long term (current) drug therapy: Secondary | ICD-10-CM | POA: Diagnosis not present

## 2022-09-06 DIAGNOSIS — I714 Abdominal aortic aneurysm, without rupture, unspecified: Secondary | ICD-10-CM

## 2022-09-06 DIAGNOSIS — Z833 Family history of diabetes mellitus: Secondary | ICD-10-CM

## 2022-09-06 DIAGNOSIS — Z87891 Personal history of nicotine dependence: Secondary | ICD-10-CM

## 2022-09-06 DIAGNOSIS — Z801 Family history of malignant neoplasm of trachea, bronchus and lung: Secondary | ICD-10-CM

## 2022-09-06 DIAGNOSIS — I10 Essential (primary) hypertension: Secondary | ICD-10-CM | POA: Diagnosis present

## 2022-09-06 DIAGNOSIS — E785 Hyperlipidemia, unspecified: Secondary | ICD-10-CM | POA: Diagnosis present

## 2022-09-06 DIAGNOSIS — R140 Abdominal distension (gaseous): Secondary | ICD-10-CM | POA: Diagnosis present

## 2022-09-06 DIAGNOSIS — C50911 Malignant neoplasm of unspecified site of right female breast: Secondary | ICD-10-CM | POA: Diagnosis present

## 2022-09-06 DIAGNOSIS — Z79811 Long term (current) use of aromatase inhibitors: Secondary | ICD-10-CM

## 2022-09-06 DIAGNOSIS — Z8673 Personal history of transient ischemic attack (TIA), and cerebral infarction without residual deficits: Secondary | ICD-10-CM

## 2022-09-06 DIAGNOSIS — Z803 Family history of malignant neoplasm of breast: Secondary | ICD-10-CM | POA: Diagnosis not present

## 2022-09-06 DIAGNOSIS — Z7952 Long term (current) use of systemic steroids: Secondary | ICD-10-CM

## 2022-09-06 DIAGNOSIS — Z853 Personal history of malignant neoplasm of breast: Secondary | ICD-10-CM

## 2022-09-06 DIAGNOSIS — Z8616 Personal history of COVID-19: Secondary | ICD-10-CM

## 2022-09-06 DIAGNOSIS — C50011 Malignant neoplasm of nipple and areola, right female breast: Secondary | ICD-10-CM

## 2022-09-06 DIAGNOSIS — I708 Atherosclerosis of other arteries: Secondary | ICD-10-CM | POA: Diagnosis present

## 2022-09-06 DIAGNOSIS — D62 Acute posthemorrhagic anemia: Secondary | ICD-10-CM | POA: Diagnosis not present

## 2022-09-06 DIAGNOSIS — Z170 Estrogen receptor positive status [ER+]: Secondary | ICD-10-CM

## 2022-09-06 DIAGNOSIS — Z825 Family history of asthma and other chronic lower respiratory diseases: Secondary | ICD-10-CM

## 2022-09-06 DIAGNOSIS — R109 Unspecified abdominal pain: Secondary | ICD-10-CM | POA: Diagnosis present

## 2022-09-06 DIAGNOSIS — I719 Aortic aneurysm of unspecified site, without rupture: Principal | ICD-10-CM

## 2022-09-06 DIAGNOSIS — I7143 Infrarenal abdominal aortic aneurysm, without rupture: Secondary | ICD-10-CM | POA: Diagnosis present

## 2022-09-06 HISTORY — PX: ULTRASOUND GUIDANCE FOR VASCULAR ACCESS: SHX6516

## 2022-09-06 HISTORY — PX: ABDOMINAL AORTIC ENDOVASCULAR STENT GRAFT: SHX5707

## 2022-09-06 LAB — CBC
HCT: 30.5 % — ABNORMAL LOW (ref 36.0–46.0)
Hemoglobin: 10.3 g/dL — ABNORMAL LOW (ref 12.0–15.0)
MCH: 29.6 pg (ref 26.0–34.0)
MCHC: 33.8 g/dL (ref 30.0–36.0)
MCV: 87.6 fL (ref 80.0–100.0)
Platelets: 97 10*3/uL — ABNORMAL LOW (ref 150–400)
RBC: 3.48 MIL/uL — ABNORMAL LOW (ref 3.87–5.11)
RDW: 13.4 % (ref 11.5–15.5)
WBC: 4.7 10*3/uL (ref 4.0–10.5)
nRBC: 0 % (ref 0.0–0.2)

## 2022-09-06 LAB — CBC WITH DIFFERENTIAL/PLATELET
Abs Immature Granulocytes: 0.02 10*3/uL (ref 0.00–0.07)
Basophils Absolute: 0 10*3/uL (ref 0.0–0.1)
Basophils Relative: 1 %
Eosinophils Absolute: 0.2 10*3/uL (ref 0.0–0.5)
Eosinophils Relative: 3 %
HCT: 38.1 % (ref 36.0–46.0)
Hemoglobin: 12.5 g/dL (ref 12.0–15.0)
Immature Granulocytes: 0 %
Lymphocytes Relative: 14 %
Lymphs Abs: 0.8 10*3/uL (ref 0.7–4.0)
MCH: 29.3 pg (ref 26.0–34.0)
MCHC: 32.8 g/dL (ref 30.0–36.0)
MCV: 89.2 fL (ref 80.0–100.0)
Monocytes Absolute: 0.6 10*3/uL (ref 0.1–1.0)
Monocytes Relative: 9 %
Neutro Abs: 4.3 10*3/uL (ref 1.7–7.7)
Neutrophils Relative %: 73 %
Platelets: 129 10*3/uL — ABNORMAL LOW (ref 150–400)
RBC: 4.27 MIL/uL (ref 3.87–5.11)
RDW: 13.6 % (ref 11.5–15.5)
WBC: 5.9 10*3/uL (ref 4.0–10.5)
nRBC: 0 % (ref 0.0–0.2)

## 2022-09-06 LAB — COMPREHENSIVE METABOLIC PANEL
ALT: 17 U/L (ref 0–44)
AST: 18 U/L (ref 15–41)
Albumin: 4 g/dL (ref 3.5–5.0)
Alkaline Phosphatase: 79 U/L (ref 38–126)
Anion gap: 12 (ref 5–15)
BUN: 19 mg/dL (ref 8–23)
CO2: 23 mmol/L (ref 22–32)
Calcium: 9 mg/dL (ref 8.9–10.3)
Chloride: 105 mmol/L (ref 98–111)
Creatinine, Ser: 0.71 mg/dL (ref 0.44–1.00)
GFR, Estimated: >60 mL/min (ref >60)
Glucose, Bld: 113 mg/dL — ABNORMAL HIGH (ref 70–99)
Potassium: 3.5 mmol/L (ref 3.5–5.1)
Sodium: 140 mmol/L (ref 135–145)
Total Bilirubin: 0.6 mg/dL (ref 0.3–1.2)
Total Protein: 7.6 g/dL (ref 6.5–8.1)

## 2022-09-06 LAB — ABO/RH: ABO/RH(D): O POS

## 2022-09-06 LAB — CREATININE, SERUM
Creatinine, Ser: 0.71 mg/dL (ref 0.44–1.00)
GFR, Estimated: >60 mL/min (ref >60)

## 2022-09-06 LAB — PROTIME-INR
INR: 1.1 (ref 0.8–1.2)
Prothrombin Time: 14.6 seconds (ref 11.4–15.2)

## 2022-09-06 LAB — APTT: aPTT: 29 seconds (ref 24–36)

## 2022-09-06 LAB — TYPE AND SCREEN
ABO/RH(D): O POS
Antibody Screen: NEGATIVE

## 2022-09-06 LAB — POCT ACTIVATED CLOTTING TIME
Activated Clotting Time: 214 seconds
Activated Clotting Time: 250 seconds
Activated Clotting Time: 140 seconds

## 2022-09-06 SURGERY — INSERTION, ENDOVASCULAR STENT GRAFT, AORTA, ABDOMINAL
Anesthesia: General | Site: Groin

## 2022-09-06 MED ORDER — NYSTATIN 100000 UNIT/ML MT SUSP
5.0000 mL | Freq: Four times a day (QID) | OROMUCOSAL | Status: DC
  Administered 2022-09-06 – 2022-09-07 (×2): 500000 [IU] via ORAL
  Filled 2022-09-06 (×2): qty 5

## 2022-09-06 MED ORDER — LIDOCAINE 2% (20 MG/ML) 5 ML SYRINGE
INTRAMUSCULAR | Status: DC | PRN
  Administered 2022-09-06: 40 mg via INTRAVENOUS

## 2022-09-06 MED ORDER — PROPOFOL 10 MG/ML IV BOLUS
INTRAVENOUS | Status: DC | PRN
  Administered 2022-09-06: 60 mg via INTRAVENOUS
  Administered 2022-09-06 (×2): 20 mg via INTRAVENOUS
  Administered 2022-09-06: 40 mg via INTRAVENOUS

## 2022-09-06 MED ORDER — LACTATED RINGERS IV SOLN: INTRAVENOUS | Status: DC

## 2022-09-06 MED ORDER — LABETALOL HCL 5 MG/ML IV SOLN: 10.0000 mg | INTRAVENOUS | Status: DC | PRN

## 2022-09-06 MED ORDER — LETROZOLE 2.5 MG PO TABS
2.5000 mg | ORAL_TABLET | Freq: Every day | ORAL | Status: DC
  Administered 2022-09-07: 2.5 mg via ORAL
  Filled 2022-09-06: qty 1

## 2022-09-06 MED ORDER — ALBUTEROL SULFATE (2.5 MG/3ML) 0.083% IN NEBU: 1.2500 mg | INHALATION_SOLUTION | Freq: Four times a day (QID) | RESPIRATORY_TRACT | Status: DC | PRN

## 2022-09-06 MED ORDER — MAGNESIUM SULFATE 2 GM/50ML IV SOLN: 2.0000 g | Freq: Every day | INTRAVENOUS | Status: DC | PRN

## 2022-09-06 MED ORDER — HEPARIN 6000 UNIT IRRIGATION SOLUTION
Status: AC
  Filled 2022-09-06: qty 500

## 2022-09-06 MED ORDER — 0.9 % SODIUM CHLORIDE (POUR BTL) OPTIME
TOPICAL | Status: DC | PRN
  Administered 2022-09-06: 1000 mL

## 2022-09-06 MED ORDER — IODIXANOL 320 MG/ML IV SOLN
INTRAVENOUS | Status: DC | PRN
  Administered 2022-09-06: 139 mL via INTRA_ARTERIAL

## 2022-09-06 MED ORDER — ACETAMINOPHEN 10 MG/ML IV SOLN: 1000.0000 mg | Freq: Once | INTRAVENOUS | Status: DC | PRN

## 2022-09-06 MED ORDER — DEXAMETHASONE 0.5 MG PO TABS
0.5000 mg | ORAL_TABLET | Freq: Every day | ORAL | Status: DC
  Filled 2022-09-06: qty 1

## 2022-09-06 MED ORDER — PHENYLEPHRINE HCL-NACL 20-0.9 MG/250ML-% IV SOLN
INTRAVENOUS | Status: DC | PRN
  Administered 2022-09-06: 80 ug via INTRAVENOUS
  Administered 2022-09-06: 40 ug via INTRAVENOUS
  Administered 2022-09-06: 80 ug via INTRAVENOUS
  Administered 2022-09-06: 15 ug/min via INTRAVENOUS
  Administered 2022-09-06 (×2): 40 ug via INTRAVENOUS

## 2022-09-06 MED ORDER — PROTAMINE SULFATE 10 MG/ML IV SOLN
INTRAVENOUS | Status: AC
  Filled 2022-09-06: qty 5

## 2022-09-06 MED ORDER — HEPARIN SODIUM (PORCINE) 5000 UNIT/ML IJ SOLN
5000.0000 [IU] | Freq: Three times a day (TID) | INTRAMUSCULAR | Status: DC
  Administered 2022-09-06 – 2022-09-07 (×2): 5000 [IU] via SUBCUTANEOUS
  Filled 2022-09-06 (×2): qty 1

## 2022-09-06 MED ORDER — LACTATED RINGERS IV SOLN: INTRAVENOUS | Status: DC | PRN

## 2022-09-06 MED ORDER — ACETAMINOPHEN 10 MG/ML IV SOLN
INTRAVENOUS | Status: DC | PRN
  Administered 2022-09-06: 1000 mg via INTRAVENOUS

## 2022-09-06 MED ORDER — ONDANSETRON HCL 4 MG/2ML IJ SOLN
INTRAMUSCULAR | Status: DC | PRN
Start: 2022-09-06 — End: 2022-09-06
  Administered 2022-09-06: 4 mg via INTRAVENOUS

## 2022-09-06 MED ORDER — HYDRALAZINE HCL 20 MG/ML IJ SOLN: 5.0000 mg | INTRAMUSCULAR | Status: DC | PRN

## 2022-09-06 MED ORDER — SUGAMMADEX SODIUM 200 MG/2ML IV SOLN
INTRAVENOUS | Status: DC | PRN
  Administered 2022-09-06: 118.2 mg via INTRAVENOUS

## 2022-09-06 MED ORDER — AMISULPRIDE (ANTIEMETIC) 5 MG/2ML IV SOLN
10.0000 mg | Freq: Once | INTRAVENOUS | Status: AC
  Administered 2022-09-06: 10 mg via INTRAVENOUS

## 2022-09-06 MED ORDER — DOCUSATE SODIUM 100 MG PO CAPS
100.0000 mg | ORAL_CAPSULE | Freq: Every day | ORAL | Status: DC
  Administered 2022-09-07: 100 mg via ORAL
  Filled 2022-09-06: qty 1

## 2022-09-06 MED ORDER — UMECLIDINIUM BROMIDE 62.5 MCG/ACT IN AEPB
1.0000 | INHALATION_SPRAY | Freq: Every day | RESPIRATORY_TRACT | Status: DC
  Administered 2022-09-07: 1 via RESPIRATORY_TRACT
  Filled 2022-09-06: qty 7

## 2022-09-06 MED ORDER — LISINOPRIL 10 MG PO TABS
10.0000 mg | ORAL_TABLET | Freq: Every day | ORAL | Status: DC
  Administered 2022-09-07: 10 mg via ORAL
  Filled 2022-09-06: qty 1

## 2022-09-06 MED ORDER — ALBUMIN HUMAN 5 % IV SOLN
12.5000 g | Freq: Once | INTRAVENOUS | Status: AC
  Administered 2022-09-06: 12.5 g via INTRAVENOUS

## 2022-09-06 MED ORDER — BISACODYL 10 MG RE SUPP: 10.0000 mg | Freq: Every day | RECTAL | Status: DC | PRN

## 2022-09-06 MED ORDER — METOPROLOL TARTRATE 5 MG/5ML IV SOLN: 2.0000 mg | INTRAVENOUS | Status: DC | PRN

## 2022-09-06 MED ORDER — ATORVASTATIN CALCIUM 10 MG PO TABS
20.0000 mg | ORAL_TABLET | Freq: Every day | ORAL | Status: DC
  Administered 2022-09-06 – 2022-09-07 (×2): 20 mg via ORAL
  Filled 2022-09-06 (×2): qty 2

## 2022-09-06 MED ORDER — PHENOL 1.4 % MT LIQD: 1.0000 | OROMUCOSAL | Status: DC | PRN

## 2022-09-06 MED ORDER — PANTOPRAZOLE SODIUM 40 MG PO TBEC
40.0000 mg | DELAYED_RELEASE_TABLET | Freq: Every day | ORAL | Status: DC
  Administered 2022-09-06 – 2022-09-07 (×2): 40 mg via ORAL
  Filled 2022-09-06 (×2): qty 1

## 2022-09-06 MED ORDER — HEPARIN SODIUM (PORCINE) 1000 UNIT/ML IJ SOLN
INTRAMUSCULAR | Status: DC | PRN
  Administered 2022-09-06: 3000 [IU] via INTRAVENOUS
  Administered 2022-09-06: 6000 [IU] via INTRAVENOUS

## 2022-09-06 MED ORDER — ALUM & MAG HYDROXIDE-SIMETH 200-200-20 MG/5ML PO SUSP: 15.0000 mL | ORAL | Status: DC | PRN

## 2022-09-06 MED ORDER — ONDANSETRON HCL 4 MG/2ML IJ SOLN
INTRAMUSCULAR | Status: AC
  Filled 2022-09-06: qty 2

## 2022-09-06 MED ORDER — ACETAMINOPHEN 160 MG/5ML PO SOLN: 1000.0000 mg | Freq: Once | ORAL | Status: DC | PRN

## 2022-09-06 MED ORDER — ACETAMINOPHEN 650 MG RE SUPP: 325.0000 mg | RECTAL | Status: DC | PRN

## 2022-09-06 MED ORDER — HYDROMORPHONE HCL 1 MG/ML IJ SOLN: 0.5000 mg | INTRAMUSCULAR | Status: DC | PRN

## 2022-09-06 MED ORDER — HEPARIN 6000 UNIT IRRIGATION SOLUTION
Status: DC | PRN
  Administered 2022-09-06: 1

## 2022-09-06 MED ORDER — SODIUM CHLORIDE 0.9 % IV SOLN: 500.0000 mL | Freq: Once | INTRAVENOUS | Status: DC | PRN

## 2022-09-06 MED ORDER — FENTANYL CITRATE (PF) 100 MCG/2ML IJ SOLN: 25.0000 ug | INTRAMUSCULAR | Status: DC | PRN

## 2022-09-06 MED ORDER — SODIUM CHLORIDE 0.9 % IV SOLN: INTRAVENOUS | Status: DC | PRN

## 2022-09-06 MED ORDER — TRAZODONE HCL 100 MG PO TABS
300.0000 mg | ORAL_TABLET | Freq: Every day | ORAL | Status: DC
  Administered 2022-09-06: 300 mg via ORAL
  Filled 2022-09-06: qty 3

## 2022-09-06 MED ORDER — ONDANSETRON HCL 4 MG/2ML IJ SOLN: 4.0000 mg | Freq: Four times a day (QID) | INTRAMUSCULAR | Status: DC | PRN

## 2022-09-06 MED ORDER — CHLORHEXIDINE GLUCONATE 0.12 % MT SOLN
OROMUCOSAL | Status: AC
  Filled 2022-09-06: qty 15

## 2022-09-06 MED ORDER — AMISULPRIDE (ANTIEMETIC) 5 MG/2ML IV SOLN
INTRAVENOUS | Status: AC
  Filled 2022-09-06: qty 4

## 2022-09-06 MED ORDER — FENTANYL CITRATE (PF) 250 MCG/5ML IJ SOLN
INTRAMUSCULAR | Status: AC
  Filled 2022-09-06: qty 5

## 2022-09-06 MED ORDER — SODIUM CHLORIDE 0.9 % IV SOLN: INTRAVENOUS | Status: DC

## 2022-09-06 MED ORDER — PROPOFOL 500 MG/50ML IV EMUL
INTRAVENOUS | Status: DC | PRN
  Administered 2022-09-06: 40 ug/kg/min via INTRAVENOUS

## 2022-09-06 MED ORDER — SODIUM CHLORIDE 0.9 % IV SOLN
INTRAVENOUS | Status: DC | PRN
Start: 2022-09-06 — End: 2022-09-06

## 2022-09-06 MED ORDER — ROCURONIUM BROMIDE 10 MG/ML (PF) SYRINGE
PREFILLED_SYRINGE | INTRAVENOUS | Status: AC
  Filled 2022-09-06: qty 10

## 2022-09-06 MED ORDER — SODIUM CHLORIDE 0.9 % IV SOLN
1.5000 g | INTRAVENOUS | Status: AC
  Administered 2022-09-06: 1.5 g via INTRAVENOUS
  Filled 2022-09-06: qty 1.5

## 2022-09-06 MED ORDER — ACETAMINOPHEN 325 MG PO TABS: 325.0000 mg | ORAL_TABLET | ORAL | Status: DC | PRN

## 2022-09-06 MED ORDER — FENTANYL CITRATE (PF) 250 MCG/5ML IJ SOLN
INTRAMUSCULAR | Status: DC | PRN
  Administered 2022-09-06 (×2): 50 ug via INTRAVENOUS
  Administered 2022-09-06: 100 ug via INTRAVENOUS

## 2022-09-06 MED ORDER — BOOST HIGH PROTEIN PO LIQD
1.0000 | Freq: Three times a day (TID) | ORAL | Status: DC
  Administered 2022-09-07: 237 mL via ORAL
  Filled 2022-09-06 (×3): qty 237

## 2022-09-06 MED ORDER — PROTAMINE SULFATE 10 MG/ML IV SOLN
INTRAVENOUS | Status: DC | PRN
  Administered 2022-09-06 (×5): 10 mg via INTRAVENOUS

## 2022-09-06 MED ORDER — PHENYLEPHRINE 80 MCG/ML (10ML) SYRINGE FOR IV PUSH (FOR BLOOD PRESSURE SUPPORT)
PREFILLED_SYRINGE | INTRAVENOUS | Status: AC
  Filled 2022-09-06: qty 10

## 2022-09-06 MED ORDER — SENNOSIDES-DOCUSATE SODIUM 8.6-50 MG PO TABS: 1.0000 | ORAL_TABLET | Freq: Every evening | ORAL | Status: DC | PRN

## 2022-09-06 MED ORDER — GUAIFENESIN-DM 100-10 MG/5ML PO SYRP: 15.0000 mL | ORAL_SOLUTION | ORAL | Status: DC | PRN

## 2022-09-06 MED ORDER — PROPOFOL 10 MG/ML IV BOLUS
INTRAVENOUS | Status: AC
  Filled 2022-09-06: qty 20

## 2022-09-06 MED ORDER — SODIUM CHLORIDE 0.9 % IV SOLN
1.5000 g | INTRAVENOUS | Status: DC
  Filled 2022-09-06: qty 1.5

## 2022-09-06 MED ORDER — CEFAZOLIN SODIUM-DEXTROSE 2-4 GM/100ML-% IV SOLN
2.0000 g | Freq: Three times a day (TID) | INTRAVENOUS | Status: AC
  Administered 2022-09-06 – 2022-09-07 (×2): 2 g via INTRAVENOUS
  Filled 2022-09-06 (×2): qty 100

## 2022-09-06 MED ORDER — ACETAMINOPHEN 500 MG PO TABS: 1000.0000 mg | ORAL_TABLET | Freq: Once | ORAL | Status: DC | PRN

## 2022-09-06 MED ORDER — ROCURONIUM BROMIDE 10 MG/ML (PF) SYRINGE
PREFILLED_SYRINGE | INTRAVENOUS | Status: DC | PRN
  Administered 2022-09-06: 20 mg via INTRAVENOUS
  Administered 2022-09-06: 60 mg via INTRAVENOUS

## 2022-09-06 MED ORDER — ARFORMOTEROL TARTRATE 15 MCG/2ML IN NEBU
15.0000 ug | INHALATION_SOLUTION | Freq: Two times a day (BID) | RESPIRATORY_TRACT | Status: DC
  Administered 2022-09-06 – 2022-09-07 (×2): 15 ug via RESPIRATORY_TRACT
  Filled 2022-09-06: qty 2

## 2022-09-06 MED ORDER — ALBUMIN HUMAN 5 % IV SOLN
INTRAVENOUS | Status: AC
  Filled 2022-09-06: qty 250

## 2022-09-06 MED ORDER — LIDOCAINE 2% (20 MG/ML) 5 ML SYRINGE
INTRAMUSCULAR | Status: AC
  Filled 2022-09-06: qty 5

## 2022-09-06 MED ORDER — OXYCODONE-ACETAMINOPHEN 5-325 MG PO TABS: 1.0000 | ORAL_TABLET | ORAL | Status: DC | PRN

## 2022-09-06 MED ORDER — POTASSIUM CHLORIDE CRYS ER 20 MEQ PO TBCR: 20.0000 meq | EXTENDED_RELEASE_TABLET | Freq: Every day | ORAL | Status: DC | PRN

## 2022-09-06 MED ORDER — ASPIRIN 81 MG PO TBEC
81.0000 mg | DELAYED_RELEASE_TABLET | Freq: Every day | ORAL | Status: DC
  Administered 2022-09-07: 81 mg via ORAL
  Filled 2022-09-06: qty 1

## 2022-09-06 SURGICAL SUPPLY — 51 items
ADH SKN CLS APL DERMABOND .7 (GAUZE/BANDAGES/DRESSINGS) ×2
BAG COUNTER SPONGE SURGICOUNT (BAG) ×3 IMPLANT
BAG SPNG CNTER NS LX DISP (BAG) ×2
CANISTER SUCT 3000ML PPV (MISCELLANEOUS) ×3 IMPLANT
CATH BEACON 5.038 65CM KMP-01 (CATHETERS) ×3 IMPLANT
CATH OMNI FLUSH .035X70CM (CATHETERS) ×3 IMPLANT
COVER MAYO STAND STRL (DRAPES) IMPLANT
DERMABOND ADVANCED .7 DNX12 (GAUZE/BANDAGES/DRESSINGS) ×3 IMPLANT
DEVICE CLOSURE PERCLS PRGLD 6F (VASCULAR PRODUCTS) ×12 IMPLANT
DEVICE TORQUE KENDALL .025-038 (MISCELLANEOUS) IMPLANT
DRSG TEGADERM 2-3/8X2-3/4 SM (GAUZE/BANDAGES/DRESSINGS) ×6 IMPLANT
ELECT REM PT RETURN 9FT ADLT (ELECTROSURGICAL) ×4
ELECTRODE REM PT RTRN 9FT ADLT (ELECTROSURGICAL) ×6 IMPLANT
EXCLDR TRNK 28.5X14.5X12 16F (Endovascular Graft) ×2 IMPLANT
EXCLUDER TNK 28.5X14.5X12 16F (Endovascular Graft) IMPLANT
GAUZE SPONGE 2X2 8PLY STRL LF (GAUZE/BANDAGES/DRESSINGS) ×3 IMPLANT
GLIDEWIRE ADV .035X180CM (WIRE) ×6 IMPLANT
GLOVE BIOGEL PI IND STRL 8 (GLOVE) ×3 IMPLANT
GOWN STRL REUS W/ TWL LRG LVL3 (GOWN DISPOSABLE) ×9 IMPLANT
GOWN STRL REUS W/TWL 2XL LVL3 (GOWN DISPOSABLE) ×6 IMPLANT
GOWN STRL REUS W/TWL LRG LVL3 (GOWN DISPOSABLE) ×6
GRAFT BALLN CATH 65CM (BALLOONS) ×3 IMPLANT
GUIDEWIRE ANGLED .035X150CM (WIRE) IMPLANT
KIT BASIN OR (CUSTOM PROCEDURE TRAY) ×3 IMPLANT
KIT DRAIN CSF ACCUDRAIN (MISCELLANEOUS) IMPLANT
KIT TURNOVER KIT B (KITS) ×3 IMPLANT
LEG CONTRALATERAL 16X12X10 (Vascular Products) ×2 IMPLANT
LEG CONTRALATERAL 16X12X14 (Vascular Products) ×2 IMPLANT
NS IRRIG 1000ML POUR BTL (IV SOLUTION) ×3 IMPLANT
PACK ENDOVASCULAR (PACKS) ×3 IMPLANT
PAD ARMBOARD 7.5X6 YLW CONV (MISCELLANEOUS) ×6 IMPLANT
PERCLOSE PROGLIDE 6F (VASCULAR PRODUCTS) ×12
SET MICROPUNCTURE 5F STIFF (MISCELLANEOUS) ×3 IMPLANT
SHEATH BRITE TIP 8FR 23CM (SHEATH) ×3 IMPLANT
SHEATH DRYSEAL FLEX 12FR 33CM (SHEATH) IMPLANT
SHEATH DRYSEAL FLEX 18FR 33CM (SHEATH) IMPLANT
SHEATH PINNACLE 8F 10CM (SHEATH) ×3 IMPLANT
STENT GRAFT CONTRALAT 16X12X10 (Vascular Products) IMPLANT
STENT GRAFT CONTRALAT 16X12X14 (Vascular Products) IMPLANT
STOPCOCK MORSE 400PSI 3WAY (MISCELLANEOUS) ×3 IMPLANT
SUT MNCRL AB 4-0 PS2 18 (SUTURE) ×6 IMPLANT
SUT PROLENE 5 0 C 1 24 (SUTURE) IMPLANT
SUT VIC AB 2-0 CTX 36 (SUTURE) IMPLANT
SUT VIC AB 3-0 SH 27 (SUTURE)
SUT VIC AB 3-0 SH 27X BRD (SUTURE) IMPLANT
SYR 20ML LL LF (SYRINGE) ×3 IMPLANT
TOWEL GREEN STERILE (TOWEL DISPOSABLE) ×3 IMPLANT
TRAY FOLEY MTR SLVR 16FR STAT (SET/KITS/TRAYS/PACK) ×3 IMPLANT
TUBING HIGH PRESSURE 120CM (CONNECTOR) ×3 IMPLANT
WIRE BENTSON .035X145CM (WIRE) ×6 IMPLANT
WIRE TORQFLEX AUST .018X40CM (WIRE) IMPLANT

## 2022-09-06 NOTE — Transfer of Care (Signed)
Immediate Anesthesia Transfer of Care Note  Patient: Elizabeth Chase  Procedure(s) Performed: ABDOMINAL AORTIC ENDOVASCULAR STENT GRAFT REPAIR (EVAR) (Groin) ULTRASOUND GUIDANCE FOR VASCULAR ACCESS (Bilateral: Groin)  Patient Location: PACU  Anesthesia Type:General  Level of Consciousness: awake, alert , and oriented  Airway & Oxygen Therapy: Patient Spontanous Breathing and Patient connected to face mask oxygen  Post-op Assessment: Report given to RN, Post -op Vital signs reviewed and stable, Patient moving all extremities X 4, and Patient able to stick tongue midline  Post vital signs: Reviewed and stable  Last Vitals:  Vitals Value Taken Time  BP 118/46   Temp 97.8   Pulse 85 09/06/22 1709  Resp 15 09/06/22 1709  SpO2 100 % 09/06/22 1709  Vitals shown include unfiled device data.  Last Pain:  Vitals:   09/06/22 1420  TempSrc: Oral  PainSc: 0-No pain         Complications: No notable events documented.

## 2022-09-06 NOTE — Anesthesia Preprocedure Evaluation (Addendum)
Anesthesia Evaluation  Patient identified by MRN, date of birth, ID band Patient awake    Reviewed: Allergy & Precautions, NPO status , Patient's Chart, lab work & pertinent test results  History of Anesthesia Complications Negative for: history of anesthetic complications  Airway Mallampati: II  TM Distance: >3 FB Neck ROM: Full    Dental   Pulmonary COPD, former smoker   breath sounds clear to auscultation       Cardiovascular hypertension, Pt. on medications + Peripheral Vascular Disease   Rhythm:Regular Rate:Normal   1. Left ventricular ejection fraction, by estimation, is 55 to 60%. The  left ventricle has normal function. The left ventricle has no regional  wall motion abnormalities. There is mild concentric left ventricular  hypertrophy. Left ventricular diastolic  parameters are consistent with Grade I diastolic dysfunction (impaired  relaxation).   2. Right ventricular systolic function is normal. The right ventricular  size is normal. There is normal pulmonary artery systolic pressure.   3. Mitral valve area by continuity equation 1.6 cm2. The mitral valve is  grossly normal. No evidence of mitral valve regurgitation. The mean mitral  valve gradient is 4.0 mmHg.   4. The aortic valve is tricuspid. There is mild calcification of the  aortic valve. There is mild thickening of the aortic valve. Aortic valve  regurgitation is not visualized. Aortic valve sclerosis is present, with  no evidence of aortic valve stenosis.   5. The inferior vena cava is normal in size with greater than 50%  respiratory variability, suggesting right atrial pressure of 3 mmHg.      Neuro/Psych CVA    GI/Hepatic negative GI ROS, Neg liver ROS,,,  Endo/Other  negative endocrine ROS    Renal/GU negative Renal ROSLab Results      Component                Value               Date                      CREATININE               0.71                 09/06/2022                Musculoskeletal   Abdominal   Peds  Hematology  (+) Blood dyscrasia, anemia Lab Results      Component                Value               Date                      WBC                      5.9                 09/06/2022                HGB                      12.5                09/06/2022                HCT  38.1                09/06/2022                MCV                      89.2                09/06/2022                PLT                      129 (L)             09/06/2022              Anesthesia Other Findings   Reproductive/Obstetrics                             Anesthesia Physical Anesthesia Plan  ASA: 3 and emergent  Anesthesia Plan: General   Post-op Pain Management: Minimal or no pain anticipated   Induction: Intravenous  PONV Risk Score and Plan: 3 and Ondansetron and Dexamethasone  Airway Management Planned: Oral ETT  Additional Equipment: Arterial line  Intra-op Plan:   Post-operative Plan: Possible Post-op intubation/ventilation  Informed Consent: I have reviewed the patients History and Physical, chart, labs and discussed the procedure including the risks, benefits and alternatives for the proposed anesthesia with the patient or authorized representative who has indicated his/her understanding and acceptance.     Dental advisory given  Plan Discussed with: CRNA  Anesthesia Plan Comments:         Anesthesia Quick Evaluation

## 2022-09-06 NOTE — Progress Notes (Signed)
I called [facility] and spoke with Elizabeth Chase who accompanied Elizabeth to Elizabeth appointment two days ago.  I reviewed that the CT scan she had yesterday afternoon did not reveal metastatic disease in Elizabeth liver like we were concerned about.  Instead it revealed an Progressive 5.4 x 5.0 cm fusiform infrarenal abdominal aortic aneurysm with large amount of plaque and thrombus.   I let Elizabeth know that I called and discussed the case with Dr. Karin Lieu just now (vascular surgeon on call) who recommended due to the size and Elizabeth symptoms that she proceed to the ER.  I explained that the aneurysm could rupture and though I realize Elizabeth Chase does not like to go to the ER, I recommended she proceed there as soon as possible.    I also shared that once this situation is evaluated and taken care of we will need to see Elizabeth back in clinic to discuss surgical referral for chest wall skin biopsy along with timing of repeat scans to follow Elizabeth enlarging lymph nodes and the chest wall skin area that is becoming more thickened which can indicate a locoregional cancer recurrence.    Patient and Chase verbalized understanding and will proceed to ER.    Lillard Anes, NP 09/06/22 8:53 AM [facility] [address] [place], [place] [address] Tel. [phone]    Fax. [phone]

## 2022-09-06 NOTE — OR Nursing (Signed)
When attempting to place foley, patient was cleaned and assessed prior to inserting catheter. Urethra appeared abnormal, discolored, and misshapen. Dr. Karin Lieu notified and told staff to proceed with prepping for surgery without foley at this time.

## 2022-09-06 NOTE — ED Notes (Signed)
Report called to [facility]

## 2022-09-06 NOTE — ED Notes (Signed)
Pt is a&ox4, pwd. Pt denies any complaints. Pt attached to monitor/vitals. Side rails up x 2, call light with patient. Covered with warm blanket.

## 2022-09-06 NOTE — ED Triage Notes (Signed)
Pt BIB EMS pcp advised pt to come to ed d/t abdominal aortic aneurysm that was seen in a ct done yesterday. Pt denies any symptoms

## 2022-09-06 NOTE — Progress Notes (Signed)
Patient placed on bedpan.  Voided 600cc clear, yellow urine.  Charted in EPIC.

## 2022-09-06 NOTE — Consult Note (Addendum)
[facility] NOTE   MRN : [id number]  Reason for Consult: AAA with reported abdominal pain Referring Physician:   History of Present Illness: 82 y/o female with reported abdominal pain that has progressively worsened over the past 6 months.  She states she has pain post prandial on and off for about 3 years.  Her abdomin is tender to palpation as well and she feel bloated often.  She has lost 7 lbs in the past 3 months.     She has a history of being homeless , but now lives with her daughter in an apartment.  Past history includes: right breat Ca, stroke, HTN.     Current Facility-Administered Medications  Medication Dose Route Frequency Provider Last Rate Last Admin   lactated ringers infusion   Intravenous Continuous Lorre Nick, MD       Current Outpatient Medications  Medication Sig Dispense Refill   albuterol (PROVENTIL) (2.5 MG/3ML) 0.083% nebulizer solution Take 1.5 mLs (1.25 mg total) by nebulization every 6 (six) hours as needed for wheezing or shortness of breath. 90 mL 12   atorvastatin (LIPITOR) 20 MG tablet TAKE 1 TABLET BY MOUTH DAILY AT 6 PM. 90 tablet 2   cetirizine (ZYRTEC) 10 MG tablet Take 1 tablet (10 mg total) by mouth daily. 30 tablet 11   cholecalciferol (VITAMIN D3) 25 MCG (1000 UNIT) tablet Take 1 tablet (1,000 Units total) by mouth daily. 90 tablet 3   dexamethasone (DECADRON) 1 MG tablet Take 0.5 tablets (0.5 mg total) by mouth daily with breakfast. 30 tablet 1   feeding supplement (BOOST HIGH PROTEIN) LIQD Take 237 mLs by mouth 3 (three) times daily between meals. Please give her 90 bottles (Patient taking differently: Take 0.5 Containers by mouth in the morning and at bedtime. Please give her 90 bottles) 237 mL 6   letrozole (FEMARA) 2.5 MG tablet Take 1 tablet (2.5 mg total) by mouth daily. 90 tablet 3   lisinopril (ZESTRIL) 10 MG tablet Take 1 tablet (10 mg total) by mouth daily. 90 tablet 3   nystatin (MYCOSTATIN)  100000 UNIT/ML suspension Take 5 mLs (500,000 Units total) by mouth 4 (four) times daily. 60 mL 0   Tiotropium Bromide-Olodaterol (STIOLTO RESPIMAT) 2.5-2.5 MCG/ACT AERS Inhale 2 puffs into the lungs daily. 12 g 1   trazodone (DESYREL) 300 MG tablet Take 1 tablet (300 mg total) by mouth at bedtime. 90 tablet 1   vitamin B-12 (CYANOCOBALAMIN) 500 MCG tablet Take 2 tablets (1,000 mcg total) by mouth daily. 90 tablet 3    Pt meds include: Statin :Yes Betablocker: No ASA: No Other anticoagulants/antiplatelets:   Past Medical History:  Diagnosis Date   Abnormal brain MRI    Abnormal glucose level    AKI (acute kidney injury) (HCC) 04/15/2020   Anemia    Blood pressure abnormally low    Breast wound 05/13/2020   Colitis 04/15/2020   COVID-19 virus infection 04/15/2020   Essential hypertension    Homelessness 05/13/2020   Hyperlipidemia    Hypertension    Hypocalcemia    Malignant neoplasm of right breast in female, estrogen receptor positive (HCC)    Muscle weakness    Neoplasm of breast, female, malignant (HCC)    Port-A-Cath in place 05/13/2020   Prolonged QT interval 04/15/2020   Right thalamic infarction (HCC) 01/01/2017   Stroke (HCC)    Syncope and collapse 04/15/2020   Systolic murmur     Past Surgical History:  Procedure Laterality Date  IR IMAGING GUIDED PORT INSERTION  04/26/2020   IR US GUIDE VASC ACCESS RIGHT  04/26/2020   MODIFIED MASTECTOMY Right 09/20/2020   Procedure: RIGHT MODIFIED RADICAL MASTECTOMY;  Surgeon: Harriette Bouillon, MD;  Location: [facility] OR;  Service: General;  Laterality: Right;    Social History Social History   Tobacco Use   Smoking status: Former    Current packs/day: 0.00    Average packs/day: 1 pack/day for 50.0 years (50.0 ttl pk-yrs)    Types: Cigarettes    Start date: 01/01/1967    Quit date: 12/31/2016    Years since quitting: 5.6   Smokeless tobacco: Never  Substance Use Topics   Alcohol use: No   Drug use: No    Family  History Family History  Problem Relation Age of Onset   Diabetes Mother    Cancer Father    Lung cancer Father    COPD Sister    Breast cancer Sister    Diabetes Sister    COPD Sister    Cancer Brother    Diabetes Brother    Lung cancer Son     Allergies  Allergen Reactions   Bee Venom     unknown     REVIEW OF SYSTEMS  General: [ ]  Weight loss, [ ]  Fever, [ ]  chills Neurologic: [ ]  Dizziness, [ ]  Blackouts, [ ]  Seizure [ ]  Stroke, [ ]  "Mini stroke", [ ]  Slurred speech, [ ]  Temporary blindness; [ ]  weakness in arms or legs, [ ]  Hoarseness [ ]  Dysphagia Cardiac: [ ]  Chest pain/pressure, [ ]  Shortness of breath at rest [ ]  Shortness of breath with exertion, [ ]  Atrial fibrillation or irregular heartbeat  Vascular: [ ]  Pain in legs with walking, [ ]  Pain in legs at rest, [ ]  Pain in legs at night,  [ ]  Non-healing ulcer, [ ]  Blood clot in vein/DVT,   Pulmonary: [ ]  Home oxygen, [ ]  Productive cough, [ ]  Coughing up blood, [ ]  Asthma,  [ ]  Wheezing [ ]  COPD Musculoskeletal:  [ ]  Arthritis, [ ]  Low back pain, [ ]  Joint pain Hematologic: [ ]  Easy Bruising, [ ]  Anemia; [ ]  Hepatitis Gastrointestinal: [ ]  Blood in stool, [ ]  Gastroesophageal Reflux/heartburn, Urinary: [ ]  chronic Kidney disease, [ ]  on HD - [ ]  MWF or [ ]  TTHS, [ ]  Burning with urination, [ ]  Difficulty urinating Skin: [ ]  Rashes, [ ]  Wounds Psychological: [ ]  Anxiety, [ ]  Depression  Physical Examination Vitals:   09/06/22 1044 09/06/22 1045  BP:  (!) 154/87  Pulse:  73  Resp:  13  Temp:  98.3 F (36.8 C)  TempSrc:  Oral  SpO2:  98%  Weight: 59.1 kg   Height: 5\' 3"  (1.6 m)    Body mass index is 23.08 kg/m.  General:  WDWN in NAD Gait:Not observed HENT: WNL Eyes: Pupils equal Pulmonary: normal non-labored breathing , without Rales, rhonchi,  wheezing Cardiac: RRR, without  Murmurs, rubs or gallops; No carotid bruits Abdomen: Tender to palpation  Skin: no rashes, ulcers noted;  no Gangrene , no  cellulitis; no open wounds;   Vascular Exam/Pulses:palpable radial and DP pulses   Musculoskeletal: no muscle wasting or atrophy; no edema  Neurologic: A&O X 3; Appropriate Affect ;  SENSATION: normal; MOTOR FUNCTION: 5/5 Symmetric Speech is fluent/normal   Significant Diagnostic Studies: CBC Lab Results  Component Value Date   WBC 5.9 09/06/2022   HGB 12.5 09/06/2022   HCT 38.1 09/06/2022  MCV 89.2 09/06/2022   PLT 129 (L) 09/06/2022    BMET    Component Value Date/Time   NA 140 09/06/2022 1048   K 3.5 09/06/2022 1048   CL 105 09/06/2022 1048   CO2 23 09/06/2022 1048   GLUCOSE 113 (H) 09/06/2022 1048   BUN 19 09/06/2022 1048   CREATININE 0.71 09/06/2022 1048   CREATININE 0.74 09/04/2022 1520   CALCIUM 9.0 09/06/2022 1048   GFRNONAA >60 09/06/2022 1048   GFRNONAA >60 09/04/2022 1520   GFRAA >60 01/02/2017 0228   Estimated Creatinine Clearance: 44.1 mL/min (by C-G formula based on SCr of 0.71 mg/dL).  COAG Lab Results  Component Value Date   INR 1.1 09/06/2022   INR 1.1 04/15/2020     Non-Invasive Vascular Imaging:  Vascular/Lymphatic: Moderate diffuse vascular calcifications. There is fusiform infrarenal abdominal aortic aneurysm with large amount of mural plaque and thrombus. Diameter has dimensions of 5.4 by 5.0 cm. On the previous examination this measured 4.2 x 4.0 cm demonstrating significant interval growth. No adjacent hematoma. Normal caliber IVC. No specific abnormal lymph node enlargement identified in the abdomen and pelvis.  ASSESSMENT/PLAN:  Symptomatic AAA  largest diameter is 5.4 with abdominal pain. NPO  Consent for EVAR repair by Dr. Karin Lieu Risks and benefits were discussed with the patient and she agrees to proceed.     Mosetta Pigeon 09/06/2022 1:07 PM  VASCULAR STAFF ADDENDUM: I have independently interviewed and examined the patient. I agree with the above.  Patient with a several month history of waxing waning  abdominal pain.  Interval CT scan demonstrates significant large amount of aneurysm over the last 2 years.  It is unknown when this enlargement occurred.  On exam in the ED, she complained of no abdominal pain, but was tender to palpation.  I had an honest discussion with her that I do not think that her abdominal tenderness is due to her aneurysm, however she does meet size criteria for repair, and I am worried that if I send her home and she ruptured, this would be life-threatening.  With abdominal pain to palpation, the aneurysm is deemed symptomatic.  After discussing the risk and benefits of endovascular aortic repair today, Elizabeth Chase elected to proceed.  Fara Olden, MD [facility] Phone Number: [phone] 09/06/2022 2:31 PM

## 2022-09-06 NOTE — Op Note (Signed)
NAME: Elizabeth Chase    MRN: [id number] DOB: 04-Mar-1940    DATE OF OPERATION: 09/06/2022  PREOP DIAGNOSIS:    Symptomatic abdominal aortic aneurysm   POSTOP DIAGNOSIS:    Same  PROCEDURE:    1 3 percutaneous access and closure of bilateral femoral arteries 02725 placement of aortobiiliac endograft [id number] placement of extension endovascular repair Bilateral common femoral artery angiograms to assess closure.  SURGEON: Victorino Sparrow  ASSIST: Mosetta Pigeon, [place]  ANESTHESIA: General  EBL: 50 mL  INDICATIONS:    Elizabeth Chase is a 82 y.o. female with recent history of waxing waning abdominal pain.  She presented to the hospital with interval increase in size of her abdominal aortic aneurysm to now greater than 5 cm.  On physical exam, she was tender to palpation in the abdomen.  By definition, this is a symptomatic aortic aneurysm, therefore she was taking immediately to the operating room for endovascular aortic repair.  FINDINGS:   Celiac artery, superior mesenteric artery, bilateral renal arteries widely patent Infrarenal abdominal aortic aneurysm present Terminal aortic stenosis involving the left common iliac artery with greater than 50% stenosis Bilateral aortoiliac segments with significant atherosclerotic disease.  Widely patent common femoral arteries bilaterally.  TECHNIQUE:   The patient was brought to the operating room after informed consent was obtained. After placement of IV's and arterial line general anesthesia was successfully induced. The patient was prepped and draped in the usual sterile fashion. Peri-operative antibiotics were given. A time-out was performed.   Bilateral percutaneous access of the CFA was obtained with ultrasound guidance. 2 perclose devices were placed in the pre-close technique and the arteriotomy was up-sized to a 8 french sheath over GWA wires. The patient was heparinized with 5000 units of IV heparin. A pigtale catheter was  advanced into the aorta and an aortogram was obtained. This demonstrated widely patent renal arteries bilaterally and a long aortic neck. Bilateral hypogastric arteries were patent. Length measurements from pre-op CT scan were confirmed. The left side sheath was up-sized to an 18 french dry seal sheath over a Glide Advantage wire. A 28.40mm x 12cm GORE conformable main body was positioned in the infrarenal aorta. Another aortogram was performed under magnification to define the renal artery origins. The graft was deployed. The contralateral limb was selected with a vertebral catheter and soft glide wire. Once advanced into the graft and wire withdrawn the vertebral catheter was freely moving. An arteriogram was performed and documented the graft was in a good infrarenal location and that the right renal artery was patent. This was double confirmation that we had selected the contralateral limb. A 12 french sheath was then advanced into the contralateral limb over a Glide Advantage wire. A  12mm x 14cm limb was inserted on the right side and deployed per the IFU. The reminder of the main body was deployed follow by 12x12cm extension deployed in standard fashion. A compliant balloon was used to dilate the main body proximal and distal seal zones as well as the contralateral overlap with the main body and the distal seal zone. A completion aortogram was obtained which revealed patent renal arteries and the left hypogastric arteries well as patent EVAR. Initially there appeared to be a type 1a v type3 endoleak. The graft was re-ballooned with follow up imaging demonstrating no evidence of type I A, IB, or type III endoleak. There was a small amount of slip, but since there was no type 1A endoleak I  elected not to cuff higher. The neck was long. I was happy with the result. The arteriotomies were closed over a wire using the previously placed perc-close devices. An extra device was used in the left groin with  excellent hemostasis, prior to complete closure bilateral femoral arteriograms were obtained via a micropuncture sheath demonstrating adequate access within the common femoral arteries with no access site complications. Bilateral femoral angiogram showed patent femoral bifurcations without access complication. Pedal pulses were checked and found to be no different from pre-op. 50 mg of protamine was given.  The skin was closed with 4-0 monocryl suture. Dry sterile dressing was placed. General anesthesia was successfully terminated. The patient was transported to the recovery room in stable position   Ladonna Snide, MD [facility] DATE OF DICTATION:   09/06/2022

## 2022-09-06 NOTE — ED Provider Notes (Signed)
Blood pressure (!) 154/87, pulse 73, temperature 98.3 F (36.8 C), temperature source Oral, resp. rate 13, height 5\' 3"  (1.6 m), weight 59.1 kg, SpO2 98%.  In short, Elizabeth Chase is a 82 y.o. female with a chief complaint of Aneurysm .  Refer to the original H&P for additional details.  01:05 PM Patient arrives to [facility] ED for Vascular Surgery evaluation. They plan to admit and take for operative repair of AAA.     Maia Plan, MD 09/07/22 1045

## 2022-09-06 NOTE — ED Provider Notes (Signed)
[place] EMERGENCY DEPARTMENT AT [facility] Provider Note   CSN: [id number] Arrival date & time: 09/06/22  1036     History  Chief Complaint  Patient presents with   Aneurysm    Elizabeth Chase is a 82 y.o. female.  82 year old female who presents with known abdominal aortic aneurysm.  Patient has had abdominal pain for 6 months.  Went to her doctor yesterday had abdominal CT which showed an aneurysm.  Was called today by her doctor and told to come to the ER.  External charts reviewed and her provider had spoken with the vascular surgeon who had reviewed the films and told the patient to come to the ER.  Patient denies any severe pain at this time.  Has not had any syncope.  No emesis.  States she feels at her baseline otherwise.       Home Medications Prior to Admission medications   Medication Sig Start Date End Date Taking? Authorizing Provider  albuterol (PROVENTIL) (2.5 MG/3ML) 0.083% nebulizer solution Take 1.5 mLs (1.25 mg total) by nebulization every 6 (six) hours as needed for wheezing or shortness of breath. 04/10/21   Serena Croissant, MD  atorvastatin (LIPITOR) 20 MG tablet TAKE 1 TABLET BY MOUTH DAILY AT 6 PM. 07/23/22   Serena Croissant, MD  cetirizine (ZYRTEC) 10 MG tablet Take 1 tablet (10 mg total) by mouth daily. 05/25/21   Ivonne Andrew, NP  cholecalciferol (VITAMIN D3) 25 MCG (1000 UNIT) tablet Take 1 tablet (1,000 Units total) by mouth daily. 05/24/21   Serena Croissant, MD  dexamethasone (DECADRON) 1 MG tablet Take 0.5 tablets (0.5 mg total) by mouth daily with breakfast. 01/17/22   Serena Croissant, MD  feeding supplement (BOOST HIGH PROTEIN) LIQD Take 237 mLs by mouth 3 (three) times daily between meals. Please give her 90 bottles Patient taking differently: Take 0.5 Containers by mouth in the morning and at bedtime. Please give her 90 bottles 05/20/20   Serena Croissant, MD  letrozole [facility]) 2.5 MG tablet Take 1 tablet (2.5 mg total) by mouth daily. 08/07/22    Serena Croissant, MD  lisinopril (ZESTRIL) 10 MG tablet Take 1 tablet (10 mg total) by mouth daily. 04/17/22   Serena Croissant, MD  nystatin (MYCOSTATIN) 100000 UNIT/ML suspension Take 5 mLs (500,000 Units total) by mouth 4 (four) times daily. 05/31/21   Serena Croissant, MD  Tiotropium Bromide-Olodaterol (STIOLTO RESPIMAT) 2.5-2.5 MCG/ACT AERS Inhale 2 puffs into the lungs daily. 08/20/22   Charlott Holler, MD  trazodone (DESYREL) 300 MG tablet Take 1 tablet (300 mg total) by mouth at bedtime. 08/27/22   Serena Croissant, MD  vitamin B-12 (CYANOCOBALAMIN) 500 MCG tablet Take 2 tablets (1,000 mcg total) by mouth daily. 05/24/21   Serena Croissant, MD      Allergies    Bee venom    Review of Systems   Review of Systems  All other systems reviewed and are negative.   Physical Exam Updated Vital Signs BP (!) 154/87 (BP Location: Left Arm)   Pulse 73   Temp 98.3 F (36.8 C) (Oral)   Resp 13   Ht 1.6 m (5\' 3" )   Wt 59.1 kg   SpO2 98%   BMI 23.08 kg/m  Physical Exam Vitals and nursing note reviewed.  Constitutional:      General: She is not in acute distress.    Appearance: Normal appearance. She is well-developed. She is not toxic-appearing.  HENT:     Head: Normocephalic and atraumatic.  Eyes:     General: Lids are normal.     Conjunctiva/sclera: Conjunctivae normal.     Pupils: Pupils are equal, round, and reactive to light.  Neck:     Thyroid: No thyroid mass.     Trachea: No tracheal deviation.  Cardiovascular:     Rate and Rhythm: Normal rate and regular rhythm.     Heart sounds: Normal heart sounds. No murmur heard.    No gallop.  Pulmonary:     Effort: Pulmonary effort is normal. No respiratory distress.     Breath sounds: Normal breath sounds. No stridor. No decreased breath sounds, wheezing, rhonchi or rales.  Abdominal:     General: There is no distension.     Palpations: Abdomen is soft.     Tenderness: There is no abdominal tenderness. There is no rebound.  Musculoskeletal:         General: No tenderness. Normal range of motion.     Cervical back: Normal range of motion and neck supple.  Skin:    General: Skin is warm and dry.     Findings: No abrasion or rash.  Neurological:     Mental Status: She is alert and oriented to person, place, and time. Mental status is at baseline.     GCS: GCS eye subscore is 4. GCS verbal subscore is 5. GCS motor subscore is 6.     Cranial Nerves: Cranial nerves are intact. No cranial nerve deficit.     Sensory: No sensory deficit.     Motor: Motor function is intact.  Psychiatric:        Attention and Perception: Attention normal.        Speech: Speech normal.        Behavior: Behavior normal.     ED Results / Procedures / Treatments   Labs (all labs ordered are listed, but only abnormal results are displayed) Labs Reviewed - No data to display  EKG None  Radiology CT Chest W Contrast  Result Date: 09/05/2022 CLINICAL DATA:  Invasive breast cancer.  * Tracking Code: BO * EXAM: CT CHEST, ABDOMEN, AND PELVIS WITH CONTRAST TECHNIQUE: Multidetector CT imaging of the chest, abdomen and pelvis was performed following the standard protocol during bolus administration of intravenous contrast. RADIATION DOSE REDUCTION: This exam was performed according to the departmental dose-optimization program which includes automated exposure control, adjustment of the mA and/or kV according to patient size and/or use of iterative reconstruction technique. CONTRAST:  75mL OMNIPAQUE IOHEXOL 350 MG/ML SOLN COMPARISON:  04/15/2020 FINDINGS: CT CHEST FINDINGS Cardiovascular: Heart is nonenlarged. No pericardial effusion. Thoracic aorta has a normal course and caliber with scattered atherosclerotic calcified plaque. Areas of plaque are slightly irregular. Coronary artery calcifications are seen. Mediastinum/Nodes: Small hiatal hernia. Heterogeneous small thyroid. Surgical clips in the right axillary region with some soft tissue thickening. Surgical clips  are new from the prior study of 2022. The thickening could be posttreatment related. Surgical changes of right mastectomy with soft tissue thickening in this location. No specific abnormal lymph node enlargement identified in the axillary regions otherwise, hila. There are several small less than 1 cm in size mediastinal nodes identified. These are less than 1 cm in size and not pathologic by size criteria. These similar to the prior examination. There is a prominent but small left internal mammary chain lymph node on series 3, image 53 measuring 5 mm by 4 mm. This is new from previous. Lungs/Pleura: Left lung is without consolidation, pneumothorax or effusion. No  dominant lung nodule. Diffuse centrilobular emphysematous changes. Right lung also has centrilobular emphysematous changes. There are areas of interstitial septal thickening and pleural thickening along the anterior right hemithorax which could be posttreatment related. There is apical pleural thickening as well. No separate consolidation, pneumothorax, effusion or dominant right-sided lung nodule. Musculoskeletal: Osteopenia.  Scattered degenerative changes. CT ABDOMEN PELVIS FINDINGS Hepatobiliary: Slight nodular contours of the liver. Patent portal vein. Gallbladder is nondilated. No space-occupying liver lesion. Few varices are identified including extending along the lower esophagus. Pancreas: Moderate atrophy of the pancreas.  No pancreatic mass. Spleen: Spleen is enlarged.  Cephalocaudal length of 14.4 cm. Adrenals/Urinary Tract: Adrenal glands are preserved. Kidneys are lobular in contour. There is some atrophy along the lower pole left kidney. No enhancing renal mass or collecting system dilatation. Extrarenal pelvis seen each kidney. The ureters have normal course and caliber extending down to the bladder. Preserved contours of the urinary bladder. Stomach/Bowel: No oral contrast. Left-sided colonic diverticula. The large bowel has a normal  course and caliber. Stomach is underdistended. Small bowel is nondilated. Mild-to-moderate colonic stool. Vascular/Lymphatic: Moderate diffuse vascular calcifications. There is fusiform infrarenal abdominal aortic aneurysm with large amount of mural plaque and thrombus. Diameter has dimensions of 5.4 by 5.0 cm. On the previous examination this measured 4.2 x 4.0 cm demonstrating significant interval growth. No adjacent hematoma. Normal caliber IVC. No specific abnormal lymph node enlargement identified in the abdomen and pelvis. Reproductive: Uterus and bilateral adnexa are unremarkable. Other: Small amount of free fluid in the dependent pelvis, nonspecific. No free air. Musculoskeletal: Scattered degenerative changes of the spine and pelvis. Multilevel disc bulging along the lumbar spine. IMPRESSION: Previous right mastectomy and axillary lymph node dissection. There is slight soft tissue thickening in the area of the surgical clips in the axilla and along the right chest wall anteriorly. Please correlate for any more recent prior examination including a postoperative study to assess stability of the finding. Otherwise correlate with clinical findings. Few prominent mediastinal lymph nodes which are similar to previous examination and less than a cm in short axis. There is one new prominent left internal mammary chain node but less than a cm in short axis. Recommend short follow-up. Advanced emphysematous lung changes. Evidence of chronic liver disease with nodular fatty liver, splenomegaly and varices including along the lower esophagus. There is trace free fluid in the pelvis as well. Progressive 5.4 x 5.0 cm fusiform infrarenal abdominal aortic aneurysm with large amount of plaque and thrombus. Recommend referral to a vascular specialist. This recommendation follows ACR consensus guidelines: White Paper of the ACR Incidental Findings Committee II on Vascular Findings. J Am Coll Radiol 2013; 10:789-794.  previously this measured 4.2 cm in March 2022. Left-sided colonic diverticula. Findings will be called to the ordering service by the Radiology physician assistant team Electronically Signed   By: Karen Kays M.D.   On: 09/05/2022 18:32   CT ABDOMEN PELVIS W CONTRAST  Result Date: 09/05/2022 CLINICAL DATA:  Invasive breast cancer.  * Tracking Code: BO * EXAM: CT CHEST, ABDOMEN, AND PELVIS WITH CONTRAST TECHNIQUE: Multidetector CT imaging of the chest, abdomen and pelvis was performed following the standard protocol during bolus administration of intravenous contrast. RADIATION DOSE REDUCTION: This exam was performed according to the departmental dose-optimization program which includes automated exposure control, adjustment of the mA and/or kV according to patient size and/or use of iterative reconstruction technique. CONTRAST:  75mL OMNIPAQUE IOHEXOL 350 MG/ML SOLN COMPARISON:  04/15/2020 FINDINGS: CT CHEST FINDINGS Cardiovascular:  Heart is nonenlarged. No pericardial effusion. Thoracic aorta has a normal course and caliber with scattered atherosclerotic calcified plaque. Areas of plaque are slightly irregular. Coronary artery calcifications are seen. Mediastinum/Nodes: Small hiatal hernia. Heterogeneous small thyroid. Surgical clips in the right axillary region with some soft tissue thickening. Surgical clips are new from the prior study of 2022. The thickening could be posttreatment related. Surgical changes of right mastectomy with soft tissue thickening in this location. No specific abnormal lymph node enlargement identified in the axillary regions otherwise, hila. There are several small less than 1 cm in size mediastinal nodes identified. These are less than 1 cm in size and not pathologic by size criteria. These similar to the prior examination. There is a prominent but small left internal mammary chain lymph node on series 3, image 53 measuring 5 mm by 4 mm. This is new from previous. Lungs/Pleura:  Left lung is without consolidation, pneumothorax or effusion. No dominant lung nodule. Diffuse centrilobular emphysematous changes. Right lung also has centrilobular emphysematous changes. There are areas of interstitial septal thickening and pleural thickening along the anterior right hemithorax which could be posttreatment related. There is apical pleural thickening as well. No separate consolidation, pneumothorax, effusion or dominant right-sided lung nodule. Musculoskeletal: Osteopenia.  Scattered degenerative changes. CT ABDOMEN PELVIS FINDINGS Hepatobiliary: Slight nodular contours of the liver. Patent portal vein. Gallbladder is nondilated. No space-occupying liver lesion. Few varices are identified including extending along the lower esophagus. Pancreas: Moderate atrophy of the pancreas.  No pancreatic mass. Spleen: Spleen is enlarged.  Cephalocaudal length of 14.4 cm. Adrenals/Urinary Tract: Adrenal glands are preserved. Kidneys are lobular in contour. There is some atrophy along the lower pole left kidney. No enhancing renal mass or collecting system dilatation. Extrarenal pelvis seen each kidney. The ureters have normal course and caliber extending down to the bladder. Preserved contours of the urinary bladder. Stomach/Bowel: No oral contrast. Left-sided colonic diverticula. The large bowel has a normal course and caliber. Stomach is underdistended. Small bowel is nondilated. Mild-to-moderate colonic stool. Vascular/Lymphatic: Moderate diffuse vascular calcifications. There is fusiform infrarenal abdominal aortic aneurysm with large amount of mural plaque and thrombus. Diameter has dimensions of 5.4 by 5.0 cm. On the previous examination this measured 4.2 x 4.0 cm demonstrating significant interval growth. No adjacent hematoma. Normal caliber IVC. No specific abnormal lymph node enlargement identified in the abdomen and pelvis. Reproductive: Uterus and bilateral adnexa are unremarkable. Other: Small  amount of free fluid in the dependent pelvis, nonspecific. No free air. Musculoskeletal: Scattered degenerative changes of the spine and pelvis. Multilevel disc bulging along the lumbar spine. IMPRESSION: Previous right mastectomy and axillary lymph node dissection. There is slight soft tissue thickening in the area of the surgical clips in the axilla and along the right chest wall anteriorly. Please correlate for any more recent prior examination including a postoperative study to assess stability of the finding. Otherwise correlate with clinical findings. Few prominent mediastinal lymph nodes which are similar to previous examination and less than a cm in short axis. There is one new prominent left internal mammary chain node but less than a cm in short axis. Recommend short follow-up. Advanced emphysematous lung changes. Evidence of chronic liver disease with nodular fatty liver, splenomegaly and varices including along the lower esophagus. There is trace free fluid in the pelvis as well. Progressive 5.4 x 5.0 cm fusiform infrarenal abdominal aortic aneurysm with large amount of plaque and thrombus. Recommend referral to a vascular specialist. This recommendation follows ACR consensus  guidelines: White Paper of the ACR Incidental Findings Committee II on Vascular Findings. J Am Coll Radiol 2013; 10:789-794. previously this measured 4.2 cm in March 2022. Left-sided colonic diverticula. Findings will be called to the ordering service by the Radiology physician assistant team Electronically Signed   By: Karen Kays M.D.   On: 09/05/2022 18:32    Procedures Procedures    Medications Ordered in ED Medications - No data to display  ED Course/ Medical Decision Making/ A&P                             Medical Decision Making Amount and/or Complexity of Data Reviewed Labs: ordered.  Risk Prescription drug management.   Patient has had abdominal pain for quite some time over 6 months.  She has no signs  of acute abdomen at this time.  CT scan results reviewed.  Blood pressure stable.  No indication for starting emergent therapy for hypertension at this time.  Discussed with Dr. Sherral Hammers from vascular surgery.  Request ED to ED transfer.  Discussed with [facility]. Julieanne Manson at [facility] ED and she will be sent there.  Patient notified        Final Clinical Impression(s) / ED Diagnoses Final diagnoses:  None    Rx / DC Orders ED Discharge Orders     None         Lorre Nick, MD 09/06/22 1150

## 2022-09-06 NOTE — Assessment & Plan Note (Signed)
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 Neoadj chemo with Taxotere Herceptin Perjeta x6 cycles completed 08/26/2020 followed by Daiva Huge maintenance 2. 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 Adj XRT 4. Adj Anti estrogen therapy -------------------------------------------------------------------------------------------------- Current treatment: Letrozole  Elizabeth Chase has stage IIIc right-sided invasive ductal carcinoma.  Based on her surgical response to neoadjuvant chemotherapy she presents a high risk for breast cancer recurrence.  She needs to undergo CT chest abdomen pelvis however since she does have some tenderness in her right abdomen I placed orders for stat CT chest abdomen pelvis.  I let Elizabeth Chase and her daughter know that I am concerned that this may be progression of her breast cancer and to stage IV disease.  I will also obtain a CBC hepatic panel and b-met today.  We will touch base with a phone visit after her scans are completed so that we can review the results and discuss next steps.  I let her know that if her abdominal pain worsens in any way she should proceed to the emergency room.  She verbalized understanding of this but told me that she does not like to go to the emergency room.  RTC as noted above.  Case reviewed with Dr. Pamelia Hoit who will be on call when CT results are called.  He is in agreement with plan.

## 2022-09-06 NOTE — Anesthesia Procedure Notes (Signed)
Arterial Line Insertion Start/End7/25/2024 2:15 PM, 09/06/2022 2:30 PM Performed by: Ulla Potash, RN, CRNA  Patient location: Pre-op. Preanesthetic checklist: patient identified, IV checked, site marked, risks and benefits discussed, surgical consent, monitors and equipment checked, pre-op evaluation, timeout performed and anesthesia consent Lidocaine 1% used for infiltration Right, radial was placed Catheter size: 20 G Hand hygiene performed  and maximum sterile barriers used   Attempts: 1 Procedure performed without using ultrasound guided technique. Following insertion, dressing applied and Biopatch. Post procedure assessment: normal and unchanged  Patient tolerated the procedure well with no immediate complications.

## 2022-09-06 NOTE — Anesthesia Procedure Notes (Addendum)
Procedure Name: Intubation Date/Time: 09/06/2022 3:09 PM  Performed by: Cy Blamer, CRNAPre-anesthesia Checklist: Patient identified, Emergency Drugs available, Suction available and Patient being monitored Patient Re-evaluated:Patient Re-evaluated prior to induction Oxygen Delivery Method: Circle system utilized Preoxygenation: Pre-oxygenation with 100% oxygen Induction Type: IV induction Ventilation: Mask ventilation without difficulty Laryngoscope Size: Mac and 3 Grade View: Grade I Tube type: Oral Tube size: 7.0 mm Number of attempts: 1 Airway Equipment and Method: Stylet and Oral airway Placement Confirmation: ETT inserted through vocal cords under direct vision, positive ETCO2 and breath sounds checked- equal and bilateral Secured at: 21 cm Tube secured with: Tape Dental Injury: Teeth and Oropharynx as per pre-operative assessment  Comments: Intubation performed by [facility]

## 2022-09-07 ENCOUNTER — Telehealth: Payer: Self-pay

## 2022-09-07 ENCOUNTER — Encounter (HOSPITAL_COMMUNITY): Payer: Self-pay | Admitting: Vascular Surgery

## 2022-09-07 MED ORDER — ASPIRIN 81 MG PO TBEC: 81.0000 mg | DELAYED_RELEASE_TABLET | Freq: Every day | ORAL | Status: DC

## 2022-09-07 MED ORDER — OXYCODONE-ACETAMINOPHEN 5-325 MG PO TABS
1.0000 | ORAL_TABLET | Freq: Four times a day (QID) | ORAL | 0 refills | Status: DC | PRN
Start: 2022-09-07 — End: 2022-09-13

## 2022-09-07 NOTE — Progress Notes (Addendum)
  Progress Note    09/07/2022 6:43 AM 1 Day Post-Op  Subjective:  no complaints.  Says her abdominal pain has resolved.  She says she has walked in the hallways and voided.   Afebrile HR 60's-90's NSR 110's-140's systolic 87% RA  Vitals:   09/06/22 2319 09/07/22 0244  BP: (!) 132/58 (!) 149/53  Pulse: 76 87  Resp: 20 16  Temp: 97.9 F (36.6 C) 97.6 F (36.4 C)  SpO2: 100% 91%    Physical Exam: General:  no distress; sitting in chair Cardiac:  regular Lungs:  non labored Incisions:  bilateral groins are soft; some ecchymosis left groin Extremities:  + doppler flow bilateral PT Abdomen:  soft  CBC    Component Value Date/Time   WBC 4.5 09/07/2022 0310   RBC 3.59 (L) 09/07/2022 0310   HGB 10.5 (L) 09/07/2022 0310   HGB 13.2 09/04/2022 1520   HCT 31.6 (L) 09/07/2022 0310   PLT 104 (L) 09/07/2022 0310   PLT 118 (L) 09/04/2022 1520   MCV 88.0 09/07/2022 0310   MCH 29.2 09/07/2022 0310   MCHC 33.2 09/07/2022 0310   RDW 13.3 09/07/2022 0310   LYMPHSABS 0.8 09/06/2022 1048   MONOABS 0.6 09/06/2022 1048   EOSABS 0.2 09/06/2022 1048   BASOSABS 0.0 09/06/2022 1048    BMET    Component Value Date/Time   NA 139 09/07/2022 0310   K 3.3 (L) 09/07/2022 0310   CL 110 09/07/2022 0310   CO2 22 09/07/2022 0310   GLUCOSE 127 (H) 09/07/2022 0310   BUN 10 09/07/2022 0310   CREATININE 0.74 09/07/2022 0310   CREATININE 0.74 09/04/2022 1520   CALCIUM 8.2 (L) 09/07/2022 0310   GFRNONAA >60 09/07/2022 0310   GFRNONAA >60 09/04/2022 1520   GFRAA >60 01/02/2017 0228    INR    Component Value Date/Time   INR 1.1 09/06/2022 1048     Intake/Output Summary (Last 24 hours) at 09/07/2022 0643 Last data filed at 09/07/2022 0244 Gross per 24 hour  Intake 1916.48 ml  Output 1075 ml  Net 841.48 ml      Assessment/Plan:  82 y.o. female is s/p:  EVAR  1 Day Post-Op   -pt pre op abdominal pain improved.  She has ambulated and voided.  She has + doppler flow bilateral PT.    -acute surgical blood loss anemia-tolerating -DVT prophylaxis:  sq heparin -discussed with pt about starting baby asa enteric coated. -discharge home today and f/u with Dr. Karin Lieu in 4 weeks with CT a/p   Doreatha Massed, PA-C [facility] [phone] 09/07/2022 6:43 AM  VASCULAR STAFF ADDENDUM: I have independently interviewed and examined the patient. I agree with the above.  Groins soft, palpable DPs Aneurysm excluded. Doing well. Less pain. Home today.  Likely needs GI workup for her bloating in the outpt setting.  Will see in follow up.  Fara Olden, MD [facility] Phone Number: [phone] 09/07/2022 8:12 AM

## 2022-09-07 NOTE — Anesthesia Postprocedure Evaluation (Signed)
Anesthesia Post Note  Patient: Elizabeth Chase  Procedure(s) Performed: ABDOMINAL AORTIC ENDOVASCULAR STENT GRAFT REPAIR (EVAR) (Groin) ULTRASOUND GUIDANCE FOR VASCULAR ACCESS (Bilateral: Groin)     Patient location during evaluation: PACU Anesthesia Type: General Level of consciousness: awake and alert Pain management: pain level controlled Vital Signs Assessment: post-procedure vital signs reviewed and stable Respiratory status: spontaneous breathing, nonlabored ventilation, respiratory function stable and patient connected to nasal cannula oxygen Cardiovascular status: blood pressure returned to baseline and stable Postop Assessment: no apparent nausea or vomiting Anesthetic complications: no   No notable events documented.  Last Vitals:  Vitals:   09/07/22 0742 09/07/22 0814  BP:  (!) 117/50  Pulse:  100  Resp:  (!) 24  Temp:  37.1 C  SpO2: 94% 91%    Last Pain:  Vitals:   09/07/22 0814  TempSrc: Oral  PainSc: 0-No pain   Pain Goal:                   [name]

## 2022-09-07 NOTE — Progress Notes (Signed)
Explained discharge instructions to patient. Reviewed follow up appointment and next medication administration times. Also reviewed education. Patient verbalized having an understanding for instructions given. All belongings are in the patient's possession. IV and telemetry were removed. CCMD was notified. No other needs verbalized. Transported downstairs for discharge. 

## 2022-09-07 NOTE — Telephone Encounter (Signed)
Received call from pt's daughter, Kendal Hymen requesting call back. LVM for call back.

## 2022-09-07 NOTE — Discharge Instructions (Signed)
  [facility] [place]   Discharge Instructions  Endovascular Aortic Aneurysm Repair  Please refer to the following instructions for your post-procedure care. Your surgeon or Physician Assistant will discuss any changes with you.  Activity  You are encouraged to walk as much as you can. You can slowly return to normal activities but must avoid strenuous activity and heavy lifting until your doctor tells you it's OK. Avoid activities such as vacuuming or swinging a gold club. It is normal to feel tired for several weeks after your surgery. Do not drive until your doctor gives the OK and you are no longer taking prescription pain medications. It is also normal to have difficulty with sleep habits, eating, and bowel movements after surgery. These will go away with time.  Bathing/Showering  Shower daily after you go home.  Do not soak in a bathtub, hot tub, or swim until the incision heals completely.  If you have incisions in your groin, wash the groin wounds with soap and water daily and pat dry. (No tub bath-only shower)  Then put a dry gauze or washcloth there to keep this area dry to help prevent wound infection daily and as needed.  Do not use Vaseline or neosporin on your incisions.  Only use soap and water on your incisions and then protect and keep dry.  Incision Care  Shower every day. Clean your incision with mild soap and water. Pat the area dry with a clean towel. You do not need a bandage unless otherwise instructed. Do not apply any ointments or creams to your incision. If you clothing is irritating, you may cover your incision with a dry gauze pad.  Diet  Resume your normal diet. There are no special food restrictions following this procedure. A low fat/low cholesterol diet is recommended for all patients with vascular disease. In order to heal from your surgery, it is CRITICAL to get adequate nutrition. Your body requires vitamins, minerals, and protein.  Vegetables are the best source of vitamins and minerals. Vegetables also provide the perfect balance of protein. Processed food has little nutritional value, so try to avoid this.  Medications  Resume taking all of your medications unless your doctor or nurse practitioner tells you not to. If your incision is causing pain, you may take over-the-counter pain relievers such as acetaminophen (Tylenol). If you were prescribed a stronger pain medication, please be aware these medications can cause nausea and constipation. Prevent nausea by taking the medication with a snack or meal. Avoid constipation by drinking plenty of fluids and eating foods with a high amount of fiber, such as fruits, vegetables, and grains.  Do not take Tylenol if you are taking prescription pain medications.   Follow up  Our office will schedule a follow-up appointment with a CT scan 3-4 weeks after your surgery.  Please call us immediately for any of the following conditions  Severe or worsening pain in your legs or feet or in your abdomen back or chest. Increased pain, redness, drainage (pus) from your incision site. Increased abdominal pain, bloating, nausea, vomiting or persistent diarrhea. Fever of 101 degrees or higher. Swelling in your leg (s),  Reduce your risk of vascular disease  Stop smoking. If you would like help call [facility] at 1-800-QUIT-NOW (1-800-784-8669) or [place] at 336-586-4000. Manage your cholesterol Maintain a desired weight Control your diabetes Keep your blood pressure down  If you have questions, please call the office at [phone].  

## 2022-09-07 NOTE — Discharge Summary (Signed)
[facility] Discharge Summary   Elizabeth Chase 01/13/1941 82 y.o. female  MRN: [id number]  Admission Date: 09/06/2022  Discharge Date: 09/07/2022  Physician: Victorino Sparrow, MD  Admission Diagnosis: Aortic aneurysm, unspecified portion of aorta, unspecified whether ruptured [facility]) [I71.9] AAA (abdominal aortic aneurysm) (HCC) [I71.40]   HPI:   This is a 82 y.o. female with reported abdominal pain that has progressively worsened over the past 6 months.  She states she has pain post prandial on and off for about 3 years.  Her abdomin is tender to palpation as well and she feel bloated often.  She has lost 7 lbs in the past 3 months.   She has a history of being homeless , but now lives with her daughter in an apartment.  Past history includes: right breat Ca, stroke, HTN.  Hospital Course:  The patient was admitted to the hospital and taken to the operating room on 09/06/2022 and underwent: 1 3 percutaneous access and closure of bilateral femoral arteries [id number] placement of aortobiiliac endograft [phone] placement of extension endovascular repair Bilateral common femoral artery angiograms to assess closure    Findings: Celiac artery, superior mesenteric artery, bilateral renal arteries widely patent Infrarenal abdominal aortic aneurysm present Terminal aortic stenosis involving the left common iliac artery with greater than 50% stenosis Bilateral aortoiliac segments with significant atherosclerotic disease.  Widely patent common femoral arteries bilaterally  The pt tolerated the procedure well and was transported to the PACU in good condition.   By POD 1, her abdominal pain had resolved.  She had + doppler flow bilateral PT.  Acute blood loss anemia and was tolerating.  She was started on baby asa.  She was able to walk and tolerate solids without difficulty.  She was discharged home.  She does have bloating and will need GI workup in outpatient setting.    CBC    Component Value  Date/Time   WBC 4.5 09/07/2022 0310   RBC 3.59 (L) 09/07/2022 0310   HGB 10.5 (L) 09/07/2022 0310   HGB 13.2 09/04/2022 1520   HCT 31.6 (L) 09/07/2022 0310   PLT 104 (L) 09/07/2022 0310   PLT 118 (L) 09/04/2022 1520   MCV 88.0 09/07/2022 0310   MCH 29.2 09/07/2022 0310   MCHC 33.2 09/07/2022 0310   RDW 13.3 09/07/2022 0310   LYMPHSABS 0.8 09/06/2022 1048   MONOABS 0.6 09/06/2022 1048   EOSABS 0.2 09/06/2022 1048   BASOSABS 0.0 09/06/2022 1048    BMET    Component Value Date/Time   NA 139 09/07/2022 0310   K 3.3 (L) 09/07/2022 0310   CL 110 09/07/2022 0310   CO2 22 09/07/2022 0310   GLUCOSE 127 (H) 09/07/2022 0310   BUN 10 09/07/2022 0310   CREATININE 0.74 09/07/2022 0310   CREATININE 0.74 09/04/2022 1520   CALCIUM 8.2 (L) 09/07/2022 0310   GFRNONAA >60 09/07/2022 0310   GFRNONAA >60 09/04/2022 1520   GFRAA >60 01/02/2017 0228       Discharge Instructions     Discharge patient   Complete by: As directed    Discharge home after pt has eaten breakfast and been seen by Dr. Karin Lieu.   Discharge disposition: 01-Home or Self Care   Discharge patient date: 09/07/2022       Discharge Diagnosis:  Aortic aneurysm, unspecified portion of aorta, unspecified whether ruptured [facility]) [I71.9] AAA (abdominal aortic aneurysm) (HCC) [I71.40]  Secondary Diagnosis: Patient Active Problem List   Diagnosis Date Noted   AAA (abdominal aortic  aneurysm) (HCC) 09/06/2022   Health care maintenance 05/25/2021   Cancer of central portion of right breast (HCC) 10/19/2020   Breast cancer, stage 3, right (HCC) 09/20/2020   COPD (chronic obstructive pulmonary disease) (HCC) 08/05/2020   Breast wound 05/13/2020   Homelessness 05/13/2020   Malignant neoplasm of right breast in female, estrogen receptor positive (HCC)    Syncope and collapse 04/15/2020   Breast mass, right 04/15/2020   Prolonged QT interval 04/15/2020   AKI (acute kidney injury) (HCC) 04/15/2020   Colitis 04/15/2020    COVID-19 virus infection 04/15/2020   Hyperlipidemia    Abnormal brain MRI    Meningioma (HCC)    Right thalamic infarction (HCC) 01/01/2017   Essential hypertension    Past Medical History:  Diagnosis Date   Abnormal brain MRI    Abnormal glucose level    AKI (acute kidney injury) (HCC) 04/15/2020   Anemia    Blood pressure abnormally low    Breast wound 05/13/2020   Colitis 04/15/2020   COVID-19 virus infection 04/15/2020   Essential hypertension    Homelessness 05/13/2020   Hyperlipidemia    Hypertension    Hypocalcemia    Malignant neoplasm of right breast in female, estrogen receptor positive (HCC)    Muscle weakness    Neoplasm of breast, female, malignant (HCC)    Port-A-Cath in place 05/13/2020   Prolonged QT interval 04/15/2020   Right thalamic infarction (HCC) 01/01/2017   Stroke (HCC)    Syncope and collapse 04/15/2020   Systolic murmur      Allergies as of 09/07/2022       Reactions   Bee Venom    unknown        Medication List     TAKE these medications    albuterol (2.5 MG/3ML) 0.083% nebulizer solution Commonly known as: PROVENTIL Take 1.5 mLs (1.25 mg total) by nebulization every 6 (six) hours as needed for wheezing or shortness of breath.   aspirin EC 81 MG tablet Take 1 tablet (81 mg total) by mouth daily at 6 (six) AM. Swallow whole. Start taking on: September 08, 2022   atorvastatin 20 MG tablet Commonly known as: LIPITOR TAKE 1 TABLET BY MOUTH DAILY AT 6 PM. What changed: when to take this   cetirizine 10 MG tablet Commonly known as: ZYRTEC Take 1 tablet (10 mg total) by mouth daily.   cholecalciferol 25 MCG (1000 UNIT) tablet Commonly known as: VITAMIN D3 Take 1 tablet (1,000 Units total) by mouth daily.   cyanocobalamin 500 MCG tablet Commonly known as: VITAMIN B12 Take 2 tablets (1,000 mcg total) by mouth daily.   dexamethasone 1 MG tablet Commonly known as: DECADRON Take 0.5 tablets (0.5 mg total) by mouth daily with  breakfast.   feeding supplement Liqd Take 237 mLs by mouth 3 (three) times daily between meals. Please give her 90 bottles What changed:  how much to take when to take this   letrozole 2.5 MG tablet Commonly known as: FEMARA Take 1 tablet (2.5 mg total) by mouth daily.   lisinopril 10 MG tablet Commonly known as: Zestril Take 1 tablet (10 mg total) by mouth daily.   nystatin 100000 UNIT/ML suspension Commonly known as: MYCOSTATIN Take 5 mLs (500,000 Units total) by mouth 4 (four) times daily.   oxyCODONE-acetaminophen 5-325 MG tablet Commonly known as: Percocet Take 1 tablet by mouth every 6 (six) hours as needed for severe pain.   Stiolto Respimat 2.5-2.5 MCG/ACT Aers Generic drug: Tiotropium Bromide-Olodaterol Inhale 2  puffs into the lungs daily.   trazodone 300 MG tablet Commonly known as: DESYREL Take 1 tablet (300 mg total) by mouth at bedtime.        Discharge Instructions:  [facility]  Discharge Instructions Endovascular Aortic Aneurysm Repair  Please refer to the following instructions for your post-procedure care. Your surgeon or Physician Assistant will discuss any changes with you.  Activity  You are encouraged to walk as much as you can. You can slowly return to normal activities but must avoid strenuous activity and heavy lifting until your doctor tells you it's OK. Avoid activities such as vacuuming or swinging a gold club. It is normal to feel tired for several weeks after your surgery. Do not drive until your doctor gives the OK and you are no longer taking prescription pain medications. It is also normal to have difficulty with sleep habits, eating, and bowel movements after surgery. These will go away with time.  Bathing/Showering  You may shower after you go home. If you have an incision, do not soak in a bathtub, hot tub, or swim until the incision heals completely.  Incision Care  Shower every day. Clean your  incision with mild soap and water. Pat the area dry with a clean towel. You do not need a bandage unless otherwise instructed. Do not apply any ointments or creams to your incision. If you clothing is irritating, you may cover your incision with a dry gauze pad.  Diet  Resume your normal diet. There are no special food restrictions following this procedure. A low fat/low cholesterol diet is recommended for all patients with vascular disease. In order to heal from your surgery, it is CRITICAL to get adequate nutrition. Your body requires vitamins, minerals, and protein. Vegetables are the best source of vitamins and minerals. Vegetables also provide the perfect balance of protein. Processed food has little nutritional value, so try to avoid this.  Medications  Resume taking all of your medications unless your doctor or Physician Assistnat tells you not to. If your incision is causing pain, you may take over-the-counter pain relievers such as acetaminophen (Tylenol). If you were prescribed a stronger pain medication, please be aware these medications can cause nausea and constipation. Prevent nausea by taking the medication with a snack or meal. Avoid constipation by drinking plenty of fluids and eating foods with a high amount of fiber, such as fruits, vegetables, and grains.  Do not take Tylenol if you are taking prescription pain medications.   Follow up  Our office will schedule a follow-up appointment with a C.T. scan 3-4 weeks after your surgery.  Please call us immediately for any of the following conditions  Severe or worsening pain in your legs or feet or in your abdomen back or chest. Increased pain, redness, drainage (pus) from your incision sit. Increased abdominal pain, bloating, nausea, vomiting or persistent diarrhea. Fever of 101 degrees or higher. Swelling in your leg (s),  Reduce your risk of vascular disease  Stop smoking. If you would like help call [facility] at  1-800-QUIT-NOW (425-344-0848) or [place] at [phone]. Manage your cholesterol Maintain a desired weight Control your diabetes Keep your blood pressure down  If you have questions, please call the office at [phone].   Prescriptions given: 1.  Roxicet #10 No Refill 2.  Asa 81mg  daily OTC  Disposition: home  Patient's condition: is Good  Follow up: 1. Dr. Karin Lieu in 4 weeks with CTA protocol   [facility]  , PA-C [facility] [phone] 09/07/2022  7:10 AM   - For VQI Registry use - Post-op:  Time to Extubation: [x]  In OR, [ ]  < 12 hrs, [ ]  12-24 hrs, [ ]  >=24 hrs Vasopressors Req. Post-op: No MI: No., [ ]  Troponin only, [ ]  EKG or Clinical New Arrhythmia: No CHF: No ICU Stay: 1 day in progressive Transfusion: No     If yes, n/a units given  Complications: Resp failure: No., [ ]  Pneumonia, [ ]  Ventilator Chg in renal function: No., [ ]  Inc. Cr > 0.5, [ ]  Temp. Dialysis,  [ ]  Permanent dialysis Leg ischemia: No., no Surgery needed, [ ]  Yes, Surgery needed,  [ ]  Amputation Bowel ischemia: No., [ ]  Medical Rx, [ ]  Surgical Rx Wound complication: No., [ ]  Superficial separation/infection, [ ]  Return to OR Return to OR: No  Return to OR for bleeding: No Stroke: No., [ ]  Minor, [ ]  Major  Discharge medications: Statin use:  Yes  ASA use:  Yes  Plavix use:  No  Beta blocker use:  No  ARB use:  No ACEI use:  Yes CCB use:  No

## 2022-09-07 NOTE — Progress Notes (Signed)
Pt ambulated x 300 feet with front wheel walker

## 2022-09-10 ENCOUNTER — Telehealth: Payer: Self-pay | Admitting: *Deleted

## 2022-09-10 ENCOUNTER — Encounter: Payer: Self-pay | Admitting: *Deleted

## 2022-09-10 ENCOUNTER — Telehealth: Payer: Self-pay
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
## 2022-09-10 NOTE — Telephone Encounter (Signed)
Pt's daughter Kendal Hymen called stating Elizabeth Chase hasn't had a BM since Saturday. She was advised to used miralax and a stool softener as needed. She sees her PCP Thursday and will reassess.

## 2022-09-11 ENCOUNTER — Telehealth: Payer: Self-pay | Admitting: Vascular Surgery

## 2022-09-11 NOTE — Telephone Encounter (Signed)
-----   Message from [facility] sent at 09/07/2022  7:05 AM EDT ----- S/p EVAR for sx AAA 7/25.  F/u in 4 weeks with CTA a/p and see Dr. Karin Lieu.   Thanks

## 2022-09-13 ENCOUNTER — Ambulatory Visit (INDEPENDENT_AMBULATORY_CARE_PROVIDER_SITE_OTHER): Payer: Medicare HMO | Admitting: Nurse Practitioner

## 2022-09-13 ENCOUNTER — Encounter: Payer: Self-pay | Admitting: Nurse Practitioner

## 2022-09-13 VITALS — BP 125/59 | HR 79 | Temp 97.4°F | Wt 128.6 lb

## 2022-09-13 DIAGNOSIS — I714 Abdominal aortic aneurysm, without rupture, unspecified: Secondary | ICD-10-CM

## 2022-09-13 DIAGNOSIS — R6889 Other general symptoms and signs: Secondary | ICD-10-CM | POA: Diagnosis not present

## 2022-09-13 DIAGNOSIS — R1084 Generalized abdominal pain: Secondary | ICD-10-CM

## 2022-09-13 MED ORDER — ASPIRIN 81 MG PO TBEC: 81.0000 mg | DELAYED_RELEASE_TABLET | Freq: Every day | ORAL | 3 refills | Status: AC

## 2022-09-13 MED ORDER — OXYCODONE-ACETAMINOPHEN 5-325 MG PO TABS
1.0000 | ORAL_TABLET | Freq: Four times a day (QID) | ORAL | 0 refills | Status: DC | PRN
Start: 2022-09-13 — End: 2022-10-12

## 2022-09-13 NOTE — Progress Notes (Signed)
@Patient  ID: Elizabeth Chase, female    DOB: [birth year], 82 y.o.   MRN: [id number]  Chief Complaint  Patient presents with   Follow-up    Recent hospital stay-aortic aneurysm     Referring provider: Ivonne Andrew, NP   HPI  82 year old female with history of hypertension, history, COPD, colitis, right thalamic infarction, meningioma, acute kidney injury, systolic murmur, malignant neoplasm of right breast, hyperlipidemia.   Patient presents today for hospital follow-up.  She was discharged from the hospital discharge summary has not been posted in the chart.  It appears that patient had AAA repair.  Patient will be following up with vascular surgeon in 4 weeks. Laparoscopic incision sites to both groins appear well healing. Denies f/c/s, n/v/d, hemoptysis, PND, leg swelling Denies chest pain or edema       Allergies  Allergen Reactions   Bee Venom     unknown    Immunization History  Administered Date(s) Administered   Fluad Quad(high Dose 65+) 12/15/2020, 10/04/2021   PNEUMOCOCCAL CONJUGATE-20 10/04/2021   Zoster Recombinant(Shingrix) 10/04/2021    Past Medical History:  Diagnosis Date   Abnormal brain MRI    Abnormal glucose level    AKI (acute kidney injury) (HCC) 04/15/2020   Anemia    Blood pressure abnormally low    Breast wound 05/13/2020   Colitis 04/15/2020   COVID-19 virus infection 04/15/2020   Essential hypertension    Homelessness 05/13/2020   Hyperlipidemia    Hypertension    Hypocalcemia    Malignant neoplasm of right breast in female, estrogen receptor positive (HCC)    Muscle weakness    Neoplasm of breast, female, malignant (HCC)    Port-A-Cath in place 05/13/2020   Prolonged QT interval 04/15/2020   Right thalamic infarction (HCC) 01/01/2017   Stroke (HCC)    Syncope and collapse 04/15/2020   Systolic murmur     Tobacco History: Social History   Tobacco Use  Smoking Status Former   Current packs/day: 0.00   Average packs/day:  1 pack/day for 50.0 years (50.0 ttl pk-yrs)   Types: Cigarettes   Start date: 01/01/1967   Quit date: 12/31/2016   Years since quitting: 5.7  Smokeless Tobacco Never   Counseling given: Not Answered   Outpatient Encounter Medications as of 09/13/2022  Medication Sig   albuterol (PROVENTIL) (2.5 MG/3ML) 0.083% nebulizer solution Take 1.5 mLs (1.25 mg total) by nebulization every 6 (six) hours as needed for wheezing or shortness of breath.   atorvastatin (LIPITOR) 20 MG tablet TAKE 1 TABLET BY MOUTH DAILY AT 6 PM.   cetirizine (ZYRTEC) 10 MG tablet Take 1 tablet (10 mg total) by mouth daily.   cholecalciferol (VITAMIN D3) 25 MCG (1000 UNIT) tablet Take 1 tablet (1,000 Units total) by mouth daily.   feeding supplement (BOOST HIGH PROTEIN) LIQD Take 237 mLs by mouth 3 (three) times daily between meals. Please give her 90 bottles (Patient taking differently: Take 0.5 Containers by mouth in the morning and at bedtime. Please give her 90 bottles)   letrozole (FEMARA) 2.5 MG tablet Take 1 tablet (2.5 mg total) by mouth daily.   lisinopril (ZESTRIL) 10 MG tablet Take 1 tablet (10 mg total) by mouth daily.   Tiotropium Bromide-Olodaterol (STIOLTO RESPIMAT) 2.5-2.5 MCG/ACT AERS Inhale 2 puffs into the lungs daily.   trazodone (DESYREL) 300 MG tablet Take 1 tablet (300 mg total) by mouth at bedtime.   vitamin B-12 (CYANOCOBALAMIN) 500 MCG tablet Take 2 tablets (1,000 mcg total) by  mouth daily.   [DISCONTINUED] aspirin EC 81 MG tablet Take 1 tablet (81 mg total) by mouth daily at 6 (six) AM. Swallow whole.   [DISCONTINUED] oxyCODONE-acetaminophen (PERCOCET) 5-325 MG tablet Take 1 tablet by mouth every 6 (six) hours as needed for severe pain.   aspirin EC 81 MG tablet Take 1 tablet (81 mg total) by mouth daily at 6 (six) AM. Swallow whole.   dexamethasone (DECADRON) 1 MG tablet Take 0.5 tablets (0.5 mg total) by mouth daily with breakfast. (Patient not taking: Reported on 09/13/2022)   nystatin (MYCOSTATIN)  100000 UNIT/ML suspension Take 5 mLs (500,000 Units total) by mouth 4 (four) times daily. (Patient not taking: Reported on 09/13/2022)   oxyCODONE-acetaminophen (PERCOCET) 5-325 MG tablet Take 1 tablet by mouth every 6 (six) hours as needed for severe pain.   No facility-administered encounter medications on file as of 09/13/2022.     Review of Systems  Review of Systems  Constitutional: Negative.   HENT: Negative.    Cardiovascular: Negative.   Gastrointestinal: Negative.   Allergic/Immunologic: Negative.   Neurological: Negative.   Psychiatric/Behavioral: Negative.         Physical Exam  BP (!) 125/59   Pulse 79   Temp (!) 97.4 F (36.3 C) (Oral)   Wt 128 lb 9.6 oz (58.3 kg)   SpO2 96%   BMI 22.78 kg/m   Wt Readings from Last 5 Encounters:  09/13/22 128 lb 9.6 oz (58.3 kg)  09/06/22 130 lb 4.7 oz (59.1 kg)  09/04/22 130 lb 4.8 oz (59.1 kg)  05/10/22 135 lb 6.4 oz (61.4 kg)  04/17/22 137 lb 9.6 oz (62.4 kg)     Physical Exam Vitals and nursing note reviewed.  Constitutional:      General: She is not in acute distress.    Appearance: She is well-developed.  Cardiovascular:     Rate and Rhythm: Normal rate and regular rhythm.  Pulmonary:     Effort: Pulmonary effort is normal.     Breath sounds: Normal breath sounds.  Skin:         Comments: Incision sites appear well healing. Bruising still noted around these sites.   Neurological:     Mental Status: She is alert and oriented to person, place, and time.      Lab Results:  CBC    Component Value Date/Time   WBC 4.5 09/07/2022 0310   RBC 3.59 (L) 09/07/2022 0310   HGB 10.5 (L) 09/07/2022 0310   HGB 13.2 09/04/2022 1520   HCT 31.6 (L) 09/07/2022 0310   PLT 104 (L) 09/07/2022 0310   PLT 118 (L) 09/04/2022 1520   MCV 88.0 09/07/2022 0310   MCH 29.2 09/07/2022 0310   MCHC 33.2 09/07/2022 0310   RDW 13.3 09/07/2022 0310   LYMPHSABS 0.8 09/06/2022 1048   MONOABS 0.6 09/06/2022 1048   EOSABS 0.2  09/06/2022 1048   BASOSABS 0.0 09/06/2022 1048    BMET    Component Value Date/Time   NA 139 09/07/2022 0310   K 3.3 (L) 09/07/2022 0310   CL 110 09/07/2022 0310   CO2 22 09/07/2022 0310   GLUCOSE 127 (H) 09/07/2022 0310   BUN 10 09/07/2022 0310   CREATININE 0.74 09/07/2022 0310   CREATININE 0.74 09/04/2022 1520   CALCIUM 8.2 (L) 09/07/2022 0310   GFRNONAA >60 09/07/2022 0310   GFRNONAA >60 09/04/2022 1520   GFRAA >60 01/02/2017 0228    BNP    Component Value Date/Time   BNP 35.4  04/20/2020 0049    ProBNP No results found for: "PROBNP"  Imaging: PERIPHERAL VASCULAR CATHETERIZATION  Result Date: 09/06/2022 See surgical note for result.  HYBRID OR IMAGING ([facility] ONLY)  Result Date: 09/06/2022 There is no interpretation for this exam.  This order is for images obtained during a surgical procedure.  Please See "Surgeries" Tab for more information regarding the procedure.   CT Chest W Contrast  Result Date: 09/05/2022 CLINICAL DATA:  Invasive breast cancer.  * Tracking Code: BO * EXAM: CT CHEST, ABDOMEN, AND PELVIS WITH CONTRAST TECHNIQUE: Multidetector CT imaging of the chest, abdomen and pelvis was performed following the standard protocol during bolus administration of intravenous contrast. RADIATION DOSE REDUCTION: This exam was performed according to the departmental dose-optimization program which includes automated exposure control, adjustment of the mA and/or kV according to patient size and/or use of iterative reconstruction technique. CONTRAST:  75mL OMNIPAQUE IOHEXOL 350 MG/ML SOLN COMPARISON:  04/15/2020 FINDINGS: CT CHEST FINDINGS Cardiovascular: Heart is nonenlarged. No pericardial effusion. Thoracic aorta has a normal course and caliber with scattered atherosclerotic calcified plaque. Areas of plaque are slightly irregular. Coronary artery calcifications are seen. Mediastinum/Nodes: Small hiatal hernia. Heterogeneous small thyroid. Surgical clips in the right axillary  region with some soft tissue thickening. Surgical clips are new from the prior study of 2022. The thickening could be posttreatment related. Surgical changes of right mastectomy with soft tissue thickening in this location. No specific abnormal lymph node enlargement identified in the axillary regions otherwise, hila. There are several small less than 1 cm in size mediastinal nodes identified. These are less than 1 cm in size and not pathologic by size criteria. These similar to the prior examination. There is a prominent but small left internal mammary chain lymph node on series 3, image 53 measuring 5 mm by 4 mm. This is new from previous. Lungs/Pleura: Left lung is without consolidation, pneumothorax or effusion. No dominant lung nodule. Diffuse centrilobular emphysematous changes. Right lung also has centrilobular emphysematous changes. There are areas of interstitial septal thickening and pleural thickening along the anterior right hemithorax which could be posttreatment related. There is apical pleural thickening as well. No separate consolidation, pneumothorax, effusion or dominant right-sided lung nodule. Musculoskeletal: Osteopenia.  Scattered degenerative changes. CT ABDOMEN PELVIS FINDINGS Hepatobiliary: Slight nodular contours of the liver. Patent portal vein. Gallbladder is nondilated. No space-occupying liver lesion. Few varices are identified including extending along the lower esophagus. Pancreas: Moderate atrophy of the pancreas.  No pancreatic mass. Spleen: Spleen is enlarged.  Cephalocaudal length of 14.4 cm. Adrenals/Urinary Tract: Adrenal glands are preserved. Kidneys are lobular in contour. There is some atrophy along the lower pole left kidney. No enhancing renal mass or collecting system dilatation. Extrarenal pelvis seen each kidney. The ureters have normal course and caliber extending down to the bladder. Preserved contours of the urinary bladder. Stomach/Bowel: No oral contrast. Left-sided  colonic diverticula. The large bowel has a normal course and caliber. Stomach is underdistended. Small bowel is nondilated. Mild-to-moderate colonic stool. Vascular/Lymphatic: Moderate diffuse vascular calcifications. There is fusiform infrarenal abdominal aortic aneurysm with large amount of mural plaque and thrombus. Diameter has dimensions of 5.4 by 5.0 cm. On the previous examination this measured 4.2 x 4.0 cm demonstrating significant interval growth. No adjacent hematoma. Normal caliber IVC. No specific abnormal lymph node enlargement identified in the abdomen and pelvis. Reproductive: Uterus and bilateral adnexa are unremarkable. Other: Small amount of free fluid in the dependent pelvis, nonspecific. No free air. Musculoskeletal: Scattered degenerative changes  of the spine and pelvis. Multilevel disc bulging along the lumbar spine. IMPRESSION: Previous right mastectomy and axillary lymph node dissection. There is slight soft tissue thickening in the area of the surgical clips in the axilla and along the right chest wall anteriorly. Please correlate for any more recent prior examination including a postoperative study to assess stability of the finding. Otherwise correlate with clinical findings. Few prominent mediastinal lymph nodes which are similar to previous examination and less than a cm in short axis. There is one new prominent left internal mammary chain node but less than a cm in short axis. Recommend short follow-up. Advanced emphysematous lung changes. Evidence of chronic liver disease with nodular fatty liver, splenomegaly and varices including along the lower esophagus. There is trace free fluid in the pelvis as well. Progressive 5.4 x 5.0 cm fusiform infrarenal abdominal aortic aneurysm with large amount of plaque and thrombus. Recommend referral to a vascular specialist. This recommendation follows ACR consensus guidelines: White Paper of the ACR Incidental Findings Committee II on Vascular  Findings. J Am Coll Radiol 2013; 10:789-794. previously this measured 4.2 cm in March 2022. Left-sided colonic diverticula. Findings will be called to the ordering service by the Radiology physician assistant team Electronically Signed   By: Karen Kays M.D.   On: 09/05/2022 18:32   CT ABDOMEN PELVIS W CONTRAST  Result Date: 09/05/2022 CLINICAL DATA:  Invasive breast cancer.  * Tracking Code: BO * EXAM: CT CHEST, ABDOMEN, AND PELVIS WITH CONTRAST TECHNIQUE: Multidetector CT imaging of the chest, abdomen and pelvis was performed following the standard protocol during bolus administration of intravenous contrast. RADIATION DOSE REDUCTION: This exam was performed according to the departmental dose-optimization program which includes automated exposure control, adjustment of the mA and/or kV according to patient size and/or use of iterative reconstruction technique. CONTRAST:  75mL OMNIPAQUE IOHEXOL 350 MG/ML SOLN COMPARISON:  04/15/2020 FINDINGS: CT CHEST FINDINGS Cardiovascular: Heart is nonenlarged. No pericardial effusion. Thoracic aorta has a normal course and caliber with scattered atherosclerotic calcified plaque. Areas of plaque are slightly irregular. Coronary artery calcifications are seen. Mediastinum/Nodes: Small hiatal hernia. Heterogeneous small thyroid. Surgical clips in the right axillary region with some soft tissue thickening. Surgical clips are new from the prior study of 2022. The thickening could be posttreatment related. Surgical changes of right mastectomy with soft tissue thickening in this location. No specific abnormal lymph node enlargement identified in the axillary regions otherwise, hila. There are several small less than 1 cm in size mediastinal nodes identified. These are less than 1 cm in size and not pathologic by size criteria. These similar to the prior examination. There is a prominent but small left internal mammary chain lymph node on series 3, image 53 measuring 5 mm by 4 mm.  This is new from previous. Lungs/Pleura: Left lung is without consolidation, pneumothorax or effusion. No dominant lung nodule. Diffuse centrilobular emphysematous changes. Right lung also has centrilobular emphysematous changes. There are areas of interstitial septal thickening and pleural thickening along the anterior right hemithorax which could be posttreatment related. There is apical pleural thickening as well. No separate consolidation, pneumothorax, effusion or dominant right-sided lung nodule. Musculoskeletal: Osteopenia.  Scattered degenerative changes. CT ABDOMEN PELVIS FINDINGS Hepatobiliary: Slight nodular contours of the liver. Patent portal vein. Gallbladder is nondilated. No space-occupying liver lesion. Few varices are identified including extending along the lower esophagus. Pancreas: Moderate atrophy of the pancreas.  No pancreatic mass. Spleen: Spleen is enlarged.  Cephalocaudal length of 14.4 cm. Adrenals/Urinary Tract:  Adrenal glands are preserved. Kidneys are lobular in contour. There is some atrophy along the lower pole left kidney. No enhancing renal mass or collecting system dilatation. Extrarenal pelvis seen each kidney. The ureters have normal course and caliber extending down to the bladder. Preserved contours of the urinary bladder. Stomach/Bowel: No oral contrast. Left-sided colonic diverticula. The large bowel has a normal course and caliber. Stomach is underdistended. Small bowel is nondilated. Mild-to-moderate colonic stool. Vascular/Lymphatic: Moderate diffuse vascular calcifications. There is fusiform infrarenal abdominal aortic aneurysm with large amount of mural plaque and thrombus. Diameter has dimensions of 5.4 by 5.0 cm. On the previous examination this measured 4.2 x 4.0 cm demonstrating significant interval growth. No adjacent hematoma. Normal caliber IVC. No specific abnormal lymph node enlargement identified in the abdomen and pelvis. Reproductive: Uterus and bilateral  adnexa are unremarkable. Other: Small amount of free fluid in the dependent pelvis, nonspecific. No free air. Musculoskeletal: Scattered degenerative changes of the spine and pelvis. Multilevel disc bulging along the lumbar spine. IMPRESSION: Previous right mastectomy and axillary lymph node dissection. There is slight soft tissue thickening in the area of the surgical clips in the axilla and along the right chest wall anteriorly. Please correlate for any more recent prior examination including a postoperative study to assess stability of the finding. Otherwise correlate with clinical findings. Few prominent mediastinal lymph nodes which are similar to previous examination and less than a cm in short axis. There is one new prominent left internal mammary chain node but less than a cm in short axis. Recommend short follow-up. Advanced emphysematous lung changes. Evidence of chronic liver disease with nodular fatty liver, splenomegaly and varices including along the lower esophagus. There is trace free fluid in the pelvis as well. Progressive 5.4 x 5.0 cm fusiform infrarenal abdominal aortic aneurysm with large amount of plaque and thrombus. Recommend referral to a vascular specialist. This recommendation follows ACR consensus guidelines: White Paper of the ACR Incidental Findings Committee II on Vascular Findings. J Am Coll Radiol 2013; 10:789-794. previously this measured 4.2 cm in March 2022. Left-sided colonic diverticula. Findings will be called to the ordering service by the Radiology physician assistant team Electronically Signed   By: Karen Kays M.D.   On: 09/05/2022 18:32     Assessment & Plan:   Generalized abdominal pain - oxyCODONE-acetaminophen (PERCOCET) 5-325 MG tablet; Take 1 tablet by mouth every 6 (six) hours as needed for severe pain.  Dispense: 8 tablet; Refill: 0   2. Abdominal aortic aneurysm (AAA), unspecified part, unspecified whether ruptured (HCC)  -follow up with vascular  surgery   Follow up:  Follow up in 3 months     Ivonne Andrew, NP 09/14/2022

## 2022-09-13 NOTE — Patient Instructions (Signed)
1. Generalized abdominal pain  - oxyCODONE-acetaminophen (PERCOCET) 5-325 MG tablet; Take 1 tablet by mouth every 6 (six) hours as needed for severe pain.  Dispense: 8 tablet; Refill: 0   2. Abdominal aortic aneurysm (AAA), unspecified part, unspecified whether ruptured (HCC)  -follow up with vascular surgery   Follow up:  Follow up in 3 months

## 2022-09-13 NOTE — Telephone Encounter (Signed)
Appt has been scheduled.

## 2022-09-14 DIAGNOSIS — R1084 Generalized abdominal pain: Secondary | ICD-10-CM | POA: Insufficient documentation

## 2022-09-14 NOTE — Assessment & Plan Note (Signed)
-   oxyCODONE-acetaminophen (PERCOCET) 5-325 MG tablet; Take 1 tablet by mouth every 6 (six) hours as needed for severe pain.  Dispense: 8 tablet; Refill: 0   2. Abdominal aortic aneurysm (AAA), unspecified part, unspecified whether ruptured (HCC)  -follow up with vascular surgery   Follow up:  Follow up in 3 months

## 2022-09-19 ENCOUNTER — Ambulatory Visit: Payer: Medicare HMO

## 2022-09-20 ENCOUNTER — Other Ambulatory Visit: Payer: Self-pay

## 2022-09-20 DIAGNOSIS — I714 Abdominal aortic aneurysm, without rupture, unspecified: Secondary | ICD-10-CM

## 2022-09-26 ENCOUNTER — Telehealth: Payer: Self-pay

## 2022-09-26 NOTE — Telephone Encounter (Signed)
Elizabeth Chase called to ask with her mom's scan is. It appears Dr Karin Lieu ordered CTA-abc pelvis and it is scheduled at [facility] 10/17/22. LVM for Elizabeth Chase with details about appt.

## 2022-10-02 ENCOUNTER — Telehealth: Payer: Self-pay

## 2022-10-02 MED ORDER — DEXAMETHASONE 1 MG PO TABS: 0.5000 mg | ORAL_TABLET | Freq: Every day | ORAL | 1 refills | Status: DC

## 2022-10-02 NOTE — Telephone Encounter (Signed)
Pt's daughter Kendal Hymen called to request refill for Decadron. Refilled to preferred phx per MD.   She laso asked once again about CTA for her mom. She was given appt details.

## 2022-10-11 ENCOUNTER — Ambulatory Visit (INDEPENDENT_AMBULATORY_CARE_PROVIDER_SITE_OTHER): Payer: Medicare HMO

## 2022-10-11 VITALS — Ht 63.0 in | Wt 128.0 lb

## 2022-10-11 DIAGNOSIS — Z0000 Encounter for general adult medical examination without abnormal findings: Secondary | ICD-10-CM | POA: Diagnosis not present

## 2022-10-11 DIAGNOSIS — Z0100 Encounter for examination of eyes and vision without abnormal findings: Secondary | ICD-10-CM

## 2022-10-11 DIAGNOSIS — H9193 Unspecified hearing loss, bilateral: Secondary | ICD-10-CM

## 2022-10-11 NOTE — Patient Instructions (Signed)
Elizabeth Chase , Thank you for taking time to come for your Medicare Wellness Visit. I appreciate your ongoing commitment to your health goals. Please review the following plan we discussed and let me know if I can assist you in the future.   Referrals/Orders/Follow-Ups/Clinician Recommendations:    [x]   You have been referred to see an eye doctor to have your yearly eye exam. If you haven't heard from them in the next 7 business days, please call their office to schedule your appointment.  Dr. Sinda Du [address] [place] [address] PHONE [phone]    [x]   You have been referred to an audiologist to have a hearing test.If you haven't heard from them in the next week, please call their office to schedule your appointment.   [facility] Address: [address], [place], [place] [address] Phone: [phone]   This is a list of the screening recommended for you and due dates:  Health Maintenance  Topic Date Due   COVID-19 Vaccine (1) Never done   DTaP/Tdap/Td vaccine (1 - Tdap) Never done   Zoster (Shingles) Vaccine (2 of 2) 11/29/2021   Flu Shot  09/13/2022   Medicare Annual Wellness Visit  10/11/2023   Pneumonia Vaccine  Completed   DEXA scan (bone density measurement)  Completed   HPV Vaccine  Aged Out    Advanced directives: (Declined) Advance directive discussed with you today. Even though you declined this today, please call our office should you change your mind, and we can give you the proper paperwork for you to fill out.  Next Medicare Annual Wellness Visit scheduled for next year: Yes  Preventive Care 11 Years and Older, Female Preventive care refers to lifestyle choices and visits with your health care provider that can promote health and wellness. Preventive care visits are also called wellness exams. What can I expect for my preventive care visit? Counseling Your health care provider may ask you questions about your: Medical history, including: Past medical  problems. Family medical history. Pregnancy and menstrual history. History of falls. Current health, including: Memory and ability to understand (cognition). Emotional well-being. Home life and relationship well-being. Sexual activity and sexual health. Lifestyle, including: Alcohol, nicotine or tobacco, and drug use. Access to firearms. Diet, exercise, and sleep habits. Work and work Astronomer. Sunscreen use. Safety issues such as seatbelt and bike helmet use. Physical exam Your health care provider will check your: Height and weight. These may be used to calculate your BMI (body mass index). BMI is a measurement that tells if you are at a healthy weight. Waist circumference. This measures the distance around your waistline. This measurement also tells if you are at a healthy weight and may help predict your risk of certain diseases, such as type 2 diabetes and high blood pressure. Heart rate and blood pressure. Body temperature. Skin for abnormal spots. What immunizations do I need?  Vaccines are usually given at various ages, according to a schedule. Your health care provider will recommend vaccines for you based on your age, medical history, and lifestyle or other factors, such as travel or where you work. What tests do I need? Screening Your health care provider may recommend screening tests for certain conditions. This may include: Lipid and cholesterol levels. Hepatitis C test. Hepatitis B test. HIV (human immunodeficiency virus) test. STI (sexually transmitted infection) testing, if you are at risk. Lung cancer screening. Colorectal cancer screening. Diabetes screening. This is done by checking your blood sugar (  glucose) after you have not eaten for a while (fasting). Mammogram. Talk with your health care provider about how often you should have regular mammograms. BRCA-related cancer screening. This may be done if you have a family history of breast, ovarian, tubal,  or peritoneal cancers. Bone density scan. This is done to screen for osteoporosis. Talk with your health care provider about your test results, treatment options, and if necessary, the need for more tests. Follow these instructions at home: Eating and drinking  Eat a diet that includes fresh fruits and vegetables, whole grains, lean protein, and low-fat dairy products. Limit your intake of foods with high amounts of sugar, saturated fats, and salt. Take vitamin and mineral supplements as recommended by your health care provider. Do not drink alcohol if your health care provider tells you not to drink. If you drink alcohol: Limit how much you have to 0-1 drink a day. Know how much alcohol is in your drink. In the [place] one drink equals one 12 oz bottle of beer (355 mL), one 5 oz glass of wine (148 mL), or one 1 oz glass of hard liquor (44 mL). Lifestyle Brush your teeth every morning and night with fluoride toothpaste. Floss one time each day. Exercise for at least 30 minutes 5 or more days each week. Do not use any products that contain nicotine or tobacco. These products include cigarettes, chewing tobacco, and vaping devices, such as e-cigarettes. If you need help quitting, ask your health care provider. Do not use drugs. If you are sexually active, practice safe sex. Use a condom or other form of protection in order to prevent STIs. Take aspirin only as told by your health care provider. Make sure that you understand how much to take and what form to take. Work with your health care provider to find out whether it is safe and beneficial for you to take aspirin daily. Ask your health care provider if you need to take a cholesterol-lowering medicine (statin). Find healthy ways to manage stress, such as: Meditation, yoga, or listening to music. Journaling. Talking to a trusted person. Spending time with friends and family. Minimize exposure to UV radiation to reduce your risk of skin  cancer. Safety Always wear your seat belt while driving or riding in a vehicle. Do not drive: If you have been drinking alcohol. Do not ride with someone who has been drinking. When you are tired or distracted. While texting. If you have been using any mind-altering substances or drugs. Wear a helmet and other protective equipment during sports activities. If you have firearms in your house, make sure you follow all gun safety procedures. What's next? Visit your health care provider once a year for an annual wellness visit. Ask your health care provider how often you should have your eyes and teeth checked. Stay up to date on all vaccines. This information is not intended to replace advice given to you by your health care provider. Make sure you discuss any questions you have with your health care provider. Document Revised: 07/27/2020 Document Reviewed: 07/27/2020 Elsevier Patient Education  2024 [employer]. Understanding Your Risk for Falls Millions of people have serious injuries from falls each year. It is important to understand your risk of falling. Talk with your health care provider about your risk and what you can do to lower it. If you do have a serious fall, make sure to tell your provider. Falling once raises your risk of falling again. How can falls affect me? Serious  injuries from falls are common. These include: Broken bones, such as hip fractures. Head injuries, such as traumatic brain injuries (TBI) or concussions. A fear of falling can cause you to avoid activities and stay at home. This can make your muscles weaker and raise your risk for a fall. What can increase my risk? There are a number of risk factors that increase your risk for falling. The more risk factors you have, the higher your risk of falling. Serious injuries from a fall happen most often to people who are older than 83 years old. Teenagers and young adults ages 56-29 are also at higher risk. Common  risk factors include: Weakness in the lower body. Being generally weak or confused due to long-term (chronic) illness. Dizziness or balance problems. Poor vision. Medicines that cause dizziness or drowsiness. These may include: Medicines for your blood pressure, heart, anxiety, insomnia, or swelling (edema). Pain medicines. Muscle relaxants. Other risk factors include: Drinking alcohol. Having had a fall in the past. Having foot pain or wearing improper footwear. Working at a dangerous job. Having any of the following in your home: Tripping hazards, such as floor clutter or loose rugs. Poor lighting. Pets. Having dementia or memory loss. What actions can I take to lower my risk of falling?     Physical activity Stay physically fit. Do strength and balance exercises. Consider taking a regular class to build strength and balance. Yoga and tai chi are good options. Vision Have your eyes checked every year and your prescription for glasses or contacts updated as needed. Shoes and walking aids Wear non-skid shoes. Wear shoes that have rubber soles and low heels. Do not wear high heels. Do not walk around the house in socks or slippers. Use a cane or walker as told by your provider. Home safety Attach secure railings on both sides of your stairs. Install grab bars for your bathtub, shower, and toilet. Use a non-skid mat in your bathtub or shower. Attach bath mats securely with double-sided, non-slip rug tape. Use good lighting in all rooms. Keep a flashlight near your bed. Make sure there is a clear path from your bed to the bathroom. Use night-lights. Do not use throw rugs. Make sure all carpeting is taped or tacked down securely. Remove all clutter from walkways and stairways, including extension cords. Repair uneven or broken steps and floors. Avoid walking on icy or slippery surfaces. Walk on the grass instead of on icy or slick sidewalks. Use ice melter to get rid of ice on  walkways in the winter. Use a cordless phone. Questions to ask your health care provider Can you help me check my risk for a fall? Do any of my medicines make me more likely to fall? Should I take a vitamin D supplement? What exercises can I do to improve my strength and balance? Should I make an appointment to have my vision checked? Do I need a bone density test to check for weak bones (osteoporosis)? Would it help to use a cane or a walker? Where to find more information Centers for Disease Control and Prevention, [employer]: TonerPromos.no Community-Based Fall Prevention Programs: TonerPromos.no [employer] on Aging: BaseRingTones.pl Contact a health care provider if: You fall at home. You are afraid of falling at home. You feel weak, drowsy, or dizzy. This information is not intended to replace advice given to you by your health care provider. Make sure you discuss any questions you have with your health care provider. Document Revised: 10/02/2021 Document Reviewed:  10/02/2021 Elsevier Patient Education  2024 [employer].

## 2022-10-11 NOTE — Progress Notes (Signed)
 Because this visit was a virtual/telehealth visit,  certain criteria was not obtained, such a blood pressure, CBG if patient is a diabetic, and timed get up and go. Any medications not marked as "taking" was not mentioned during the medication reconciliation part of the visit. Any vitals not documented were not able to be obtained due to this being a telehealth visit. Vitals that have been documented are verbally provided by the patient.  Patient was unable to self-report a recent blood pressure reading due to a lack of equipment at home via telehealth.  Subjective:   Elizabeth Chase is a 82 y.o. female who presents for Medicare Annual (Subsequent) preventive examination.  Visit Complete: Virtual  I connected with  Tiburcio Bash on 10/11/22 by a audio enabled telemedicine application and verified that I am speaking with the correct person using two identifiers.  Patient Location: Home  Provider Location: Home Office  I discussed the limitations of evaluation and management by telemedicine. The patient expressed understanding and agreed to proceed.  Patient Medicare AWV questionnaire was completed by the patient on n/a; I have confirmed that all information answered by patient is correct and no changes since this date.  Review of Systems     Cardiac Risk Factors include: advanced age (>75men, >67 women);dyslipidemia;hypertension;sedentary lifestyle     Objective:    Today's Vitals   10/11/22 1032  Weight: 128 lb (58.1 kg)  Height: 5\' 3"  (1.6 m)   Body mass index is 22.67 kg/m.     10/11/2022   10:31 AM 09/06/2022   10:44 AM 09/04/2022    2:26 PM 11/13/2021    3:13 PM 09/19/2021   10:57 AM 09/19/2021   10:27 AM 03/15/2021   11:41 AM  Advanced Directives  Does Patient Have a Medical Advance Directive? No No No No No No No  Would patient like information on creating a medical advance directive? No - Patient declined No - Patient declined No - Patient declined No - Patient declined No -  Patient declined No - Patient declined No - Patient declined    Current Medications (verified) Outpatient Encounter Medications as of 10/11/2022  Medication Sig   albuterol (PROVENTIL) (2.5 MG/3ML) 0.083% nebulizer solution Take 1.5 mLs (1.25 mg total) by nebulization every 6 (six) hours as needed for wheezing or shortness of breath.   aspirin EC 81 MG tablet Take 1 tablet (81 mg total) by mouth daily at 6 (six) AM. Swallow whole.   atorvastatin (LIPITOR) 20 MG tablet TAKE 1 TABLET BY MOUTH DAILY AT 6 PM.   cetirizine (ZYRTEC) 10 MG tablet Take 1 tablet (10 mg total) by mouth daily.   cholecalciferol (VITAMIN D3) 25 MCG (1000 UNIT) tablet Take 1 tablet (1,000 Units total) by mouth daily.   dexamethasone (DECADRON) 1 MG tablet Take 0.5 tablets (0.5 mg total) by mouth daily with breakfast.   feeding supplement (BOOST HIGH PROTEIN) LIQD Take 237 mLs by mouth 3 (three) times daily between meals. Please give her 90 bottles (Patient taking differently: Take 0.5 Containers by mouth in the morning and at bedtime. Please give her 90 bottles)   letrozole (FEMARA) 2.5 MG tablet Take 1 tablet (2.5 mg total) by mouth daily.   lisinopril (ZESTRIL) 10 MG tablet Take 1 tablet (10 mg total) by mouth daily.   nystatin (MYCOSTATIN) 100000 UNIT/ML suspension Take 5 mLs (500,000 Units total) by mouth 4 (four) times daily.   oxyCODONE-acetaminophen (PERCOCET) 5-325 MG tablet Take 1 tablet by mouth every 6 (six)  hours as needed for severe pain.   Tiotropium Bromide-Olodaterol (STIOLTO RESPIMAT) 2.5-2.5 MCG/ACT AERS Inhale 2 puffs into the lungs daily.   trazodone (DESYREL) 300 MG tablet Take 1 tablet (300 mg total) by mouth at bedtime.   vitamin B-12 (CYANOCOBALAMIN) 500 MCG tablet Take 2 tablets (1,000 mcg total) by mouth daily.   No facility-administered encounter medications on file as of 10/11/2022.    Allergies (verified) Bee venom   History: Past Medical History:  Diagnosis Date   Abnormal brain MRI     Abnormal glucose level    AKI (acute kidney injury) (HCC) 04/15/2020   Anemia    Blood pressure abnormally low    Breast wound 05/13/2020   Colitis 04/15/2020   COVID-19 virus infection 04/15/2020   Essential hypertension    Homelessness 05/13/2020   Hyperlipidemia    Hypertension    Hypocalcemia    Malignant neoplasm of right breast in female, estrogen receptor positive (HCC)    Muscle weakness    Neoplasm of breast, female, malignant (HCC)    Port-A-Cath in place 05/13/2020   Prolonged QT interval 04/15/2020   Right thalamic infarction (HCC) 01/01/2017   Stroke (HCC)    Syncope and collapse 04/15/2020   Systolic murmur    Past Surgical History:  Procedure Laterality Date   ABDOMINAL AORTIC ENDOVASCULAR STENT GRAFT N/A 09/06/2022   Procedure: ABDOMINAL AORTIC ENDOVASCULAR STENT GRAFT REPAIR (EVAR);  Surgeon: Victorino Sparrow, MD;  Location: [facility] OR;  Service: Vascular;  Laterality: N/A;   IR IMAGING GUIDED PORT INSERTION  04/26/2020   IR US GUIDE VASC ACCESS RIGHT  04/26/2020   MODIFIED MASTECTOMY Right 09/20/2020   Procedure: RIGHT MODIFIED RADICAL MASTECTOMY;  Surgeon: Harriette Bouillon, MD;  Location: [facility] OR;  Service: General;  Laterality: Right;   ULTRASOUND GUIDANCE FOR VASCULAR ACCESS Bilateral 09/06/2022   Procedure: ULTRASOUND GUIDANCE FOR VASCULAR ACCESS;  Surgeon: Victorino Sparrow, MD;  Location: [facility] OR;  Service: Vascular;  Laterality: Bilateral;   Family History  Problem Relation Age of Onset   Diabetes Mother    Cancer Father    Lung cancer Father    COPD Sister    Breast cancer Sister    Diabetes Sister    COPD Sister    Cancer Brother    Diabetes Brother    Lung cancer Son    Social History   Socioeconomic History   Marital status: Widowed    Spouse name: Not on file   Number of children: Not on file   Years of education: Not on file   Highest education level: Not on file  Occupational History   Not on file  Tobacco Use   Smoking status: Former     Current packs/day: 0.00    Average packs/day: 1 pack/day for 50.0 years (50.0 ttl pk-yrs)    Types: Cigarettes    Start date: 01/01/1967    Quit date: 12/31/2016    Years since quitting: 5.7   Smokeless tobacco: Never  Vaping Use   Vaping status: Not on file  Substance and Sexual Activity   Alcohol use: No   Drug use: No   Sexual activity: Not on file  Other Topics Concern   Not on file  Social History Narrative   Not on file   Social Determinants of Health   Financial Resource Strain: Low Risk  (10/11/2022)   Overall Financial Resource Strain (CARDIA)    Difficulty of Paying Living Expenses: Not hard at all  Food Insecurity: No Food  Insecurity (10/11/2022)   Hunger Vital Sign    Worried About Running Out of Food in the Last Year: Never true    Ran Out of Food in the Last Year: Never true  Transportation Needs: No Transportation Needs (10/11/2022)   PRAPARE - Administrator, Civil Service (Medical): No    Lack of Transportation (Non-Medical): No  Physical Activity: Insufficiently Active (10/11/2022)   Exercise Vital Sign    Days of Exercise per Week: 7 days    Minutes of Exercise per Session: 20 min  Stress: No Stress Concern Present (10/11/2022)   [employer] of Occupational Health - Occupational Stress Questionnaire    Feeling of Stress : Not at all  Social Connections: Socially Isolated (10/11/2022)   Social Connection and Isolation Panel [NHANES]    Frequency of Communication with Friends and Family: More than three times a week    Frequency of Social Gatherings with Friends and Family: More than three times a week    Attends Religious Services: Never    Database administrator or Organizations: No    Attends Banker Meetings: Never    Marital Status: Widowed    Tobacco Counseling Counseling given: Yes   Clinical Intake:  Pre-visit preparation completed: Yes  Pain : No/denies pain     BMI - recorded: 22.67 Nutritional Status: BMI  of 19-24  Normal Nutritional Risks: None Diabetes: No  How often do you need to have someone help you when you read instructions, pamphlets, or other written materials from your doctor or pharmacy?: 1 - Never  Interpreter Needed?: No  Information entered by ::  Baker Kogler, CMA   Activities of Daily Living    10/11/2022   10:44 AM  In your present state of health, do you have any difficulty performing the following activities:  Hearing? 1  Comment referral placed.  Vision? 1  Comment referral placed.  Difficulty concentrating or making decisions? 0  Walking or climbing stairs? 0  Dressing or bathing? 0  Doing errands, shopping? 0  Preparing Food and eating ? N  Using the Toilet? N  In the past six months, have you accidently leaked urine? N  Do you have problems with loss of bowel control? N  Managing your Medications? Y  Comment daughter puts medications together for her  Managing your Finances? N  Housekeeping or managing your Housekeeping? N    Patient Care Team: Ivonne Andrew, NP as PCP - General (Pulmonary Disease) Corky Crafts, MD as PCP - Cardiology (Cardiology) Pershing Proud, RN as Oncology Nurse Navigator Donnelly Angelica, RN as Oncology Nurse Navigator Harriette Bouillon, MD as Consulting Physician (General Surgery) Serena Croissant, MD as Consulting Physician (Hematology and Oncology)  Indicate any recent Medical Services you may have received from other than [facility] providers in the past year (date may be approximate).     Assessment:   This is a routine wellness examination for [place].  Hearing/Vision screen Hearing Screening - Comments:: Patient complains of difficulty with hearing. ENT referral placed. Patient is in agreement with treatment plan. Aware that the office will call with an appointment.   Vision Screening - Comments:: Referral placed today for patient to have a hearing test. C/O bilateral hearing loss. LT worse than RT  Dietary issues  and exercise activities discussed:     Goals Addressed             This Visit's Progress    Patient Stated  Work on getting a car.        Depression Screen    10/11/2022   10:38 AM 05/25/2021   11:10 AM  PHQ 2/9 Scores  PHQ - 2 Score 0 0  PHQ- 9 Score 0     Fall Risk    10/11/2022   10:44 AM 05/25/2021   11:10 AM  Fall Risk   Falls in the past year? 0 0  Number falls in past yr: 0 0  Injury with Fall? 0 0  Risk for fall due to : No Fall Risks;Impaired balance/gait;Impaired mobility No Fall Risks  Follow up Falls prevention discussed;Education provided Falls evaluation completed    MEDICARE RISK AT HOME: Medicare Risk at Home Any stairs in or around the home?: No If so, are there any without handrails?: No Home free of loose throw rugs in walkways, pet beds, electrical cords, etc?: Yes Adequate lighting in your home to reduce risk of falls?: Yes Life alert?: No Use of a cane, walker or w/c?: No Grab bars in the bathroom?: No Shower chair or bench in shower?: No Elevated toilet seat or a handicapped toilet?: No  TIMED UP AND GO:  Was the test performed?  No    Cognitive Function:        10/11/2022   10:35 AM  6CIT Screen  What Year? 0 points  What month? 0 points  What time? 0 points  Count back from 20 0 points  Months in reverse 4 points  Repeat phrase 0 points  Total Score 4 points    Immunizations Immunization History  Administered Date(s) Administered   Fluad Quad(high Dose 65+) 12/15/2020, 10/04/2021   PNEUMOCOCCAL CONJUGATE-20 10/04/2021   Zoster Recombinant(Shingrix) 10/04/2021    TDAP status: Up to date  Flu Vaccine status: Due, Education has been provided regarding the importance of this vaccine. Advised may receive this vaccine at local pharmacy or Health Dept. Aware to provide a copy of the vaccination record if obtained from local pharmacy or Health Dept. Verbalized acceptance and understanding.  Pneumococcal vaccine status:  Up to date  Covid-19 vaccine status: Information provided on how to obtain vaccines.   Qualifies for Shingles Vaccine? Yes   Zostavax completed No   Shingrix Completed?: No.    Education has been provided regarding the importance of this vaccine. Patient has been advised to call insurance company to determine out of pocket expense if they have not yet received this vaccine. Advised may also receive vaccine at local pharmacy or Health Dept. Verbalized acceptance and understanding.  Screening Tests Health Maintenance  Topic Date Due   COVID-19 Vaccine (1) Never done   DTaP/Tdap/Td (1 - Tdap) Never done   Medicare Annual Wellness (AWV)  02/23/2020   Zoster Vaccines- Shingrix (2 of 2) 11/29/2021   INFLUENZA VACCINE  09/13/2022   Pneumonia Vaccine 44+ Years old  Completed   DEXA SCAN  Completed   HPV VACCINES  Aged Out    Health Maintenance  Health Maintenance Due  Topic Date Due   COVID-19 Vaccine (1) Never done   DTaP/Tdap/Td (1 - Tdap) Never done   Medicare Annual Wellness (AWV)  02/23/2020   Zoster Vaccines- Shingrix (2 of 2) 11/29/2021   INFLUENZA VACCINE  09/13/2022    Colorectal cancer screening: No longer required.   Mammogram status: No longer required due to age. Patient has a mammogram scheduled for 10/18/2022.  Bone Density status: Completed 04/26/2022. Results reflect: Bone density results: OSTEOPOROSIS. Repeat every no longer required due  to age years.  Lung Cancer Screening: (Low Dose CT Chest recommended if Age 75-80 years, 20 pack-year currently smoking OR have quit w/in 15years.) does not qualify.   Lung Cancer Screening Referral: na  Additional Screening:  Hepatitis C Screening: does not qualify;  Vision Screening: Recommended annual ophthalmology exams for early detection of glaucoma and other disorders of the eye. Is the patient up to date with their annual eye exam?  No  Who is the provider or what is the name of the office in which  the patient attends  annual eye exams? Referral placed today If pt is not established with a provider, would they like to be referred to a provider to establish care? Yes .   Dental Screening: Recommended annual dental exams for proper oral hygiene  Diabetic Foot Exam: na  Community Resource Referral / Chronic Care Management: CRR required this visit?  No   CCM required this visit?  No     Plan:     I have personally reviewed and noted the following in the patient's chart:   Medical and social history Use of alcohol, tobacco or illicit drugs  Current medications and supplements including opioid prescriptions. Patient is not currently taking opioid prescriptions. Functional ability and status Nutritional status Physical activity Advanced directives List of other physicians Hospitalizations, surgeries, and ER visits in previous 12 months Vitals Screenings to include cognitive, depression, and falls Referrals and appointments  In addition, I have reviewed and discussed with patient certain preventive protocols, quality metrics, and best practice recommendations. A written personalized care plan for preventive services as well as general preventive health recommendations were provided to patient.     Jordan Hawks Nyko Gell, CMA   10/11/2022   After Visit Summary: (MyChart) Due to this being a telephonic visit, the after visit summary with patients personalized plan was offered to patient via MyChart   Nurse Notes:

## 2022-10-12 ENCOUNTER — Other Ambulatory Visit: Payer: Self-pay | Admitting: Nurse Practitioner

## 2022-10-12 DIAGNOSIS — R1084 Generalized abdominal pain: Secondary | ICD-10-CM

## 2022-10-12 MED ORDER — OXYCODONE-ACETAMINOPHEN 5-325 MG PO TABS
1.0000 | ORAL_TABLET | Freq: Four times a day (QID) | ORAL | 0 refills | Status: DC | PRN
Start: 2022-10-12 — End: 2023-04-17

## 2022-10-16 ENCOUNTER — Ambulatory Visit: Payer: Medicare HMO | Admitting: Internal Medicine

## 2022-10-16 ENCOUNTER — Encounter (HOSPITAL_BASED_OUTPATIENT_CLINIC_OR_DEPARTMENT_OTHER): Payer: Medicare HMO

## 2022-10-17 ENCOUNTER — Ambulatory Visit
Admission: RE | Admit: 2022-10-17 | Discharge: 2022-10-17 | Disposition: A | Discharge status: Home / Self Care | Payer: Medicare HMO | Source: Ambulatory Visit | Attending: Vascular Surgery | Admitting: Vascular Surgery

## 2022-10-17 DIAGNOSIS — I714 Abdominal aortic aneurysm, without rupture, unspecified: Secondary | ICD-10-CM | POA: Diagnosis not present

## 2022-10-17 MED ORDER — IOPAMIDOL (ISOVUE-370) INJECTION 76%
200.0000 mL | Freq: Once | INTRAVENOUS | Status: AC | PRN
  Administered 2022-10-17: 100 mL via INTRAVENOUS

## 2022-10-18 ENCOUNTER — Ambulatory Visit
Admission: RE | Admit: 2022-10-18 | Discharge: 2022-10-18 | Disposition: A | Discharge status: Home / Self Care | Payer: Medicare HMO | Source: Ambulatory Visit | Attending: Hematology and Oncology | Admitting: Hematology and Oncology

## 2022-10-18 DIAGNOSIS — Z1231 Encounter for screening mammogram for malignant neoplasm of breast: Secondary | ICD-10-CM

## 2022-10-18 DIAGNOSIS — R6889 Other general symptoms and signs: Secondary | ICD-10-CM | POA: Diagnosis not present

## 2022-10-25 ENCOUNTER — Telehealth: Payer: Self-pay | Admitting: Vascular Surgery

## 2022-10-26 ENCOUNTER — Encounter: Payer: Medicare HMO | Admitting: Vascular Surgery

## 2022-11-22 ENCOUNTER — Ambulatory Visit: Payer: Medicare HMO | Admitting: Audiology

## 2022-11-23 ENCOUNTER — Ambulatory Visit: Payer: Medicare HMO | Admitting: Audiologist

## 2022-11-26 NOTE — Progress Notes (Unsigned)
Office Note   Requesting Provider:  Ivonne Andrew, NP  HPI: Elizabeth Chase is a 82 y.o. (Jan 08, 1941) female presenting in follow up s/p EVAR for symptomatic AAA.  The pt is *** on a statin for cholesterol management.  The pt is *** on a daily aspirin.   Other AC:  *** The pt is *** on medication for hypertension.   The pt is *** diabetic.  Tobacco hx:  ***  Past Medical History:  Diagnosis Date   Abnormal brain MRI    Abnormal glucose level    AKI (acute kidney injury) (HCC) 04/15/2020   Anemia    Blood pressure abnormally low    Breast wound 05/13/2020   Colitis 04/15/2020   COVID-19 virus infection 04/15/2020   Essential hypertension    Homelessness 05/13/2020   Hyperlipidemia    Hypertension    Hypocalcemia    Malignant neoplasm of right breast in female, estrogen receptor positive (HCC)    Muscle weakness    Neoplasm of breast, female, malignant (HCC)    Port-A-Cath in place 05/13/2020   Prolonged QT interval 04/15/2020   Right thalamic infarction (HCC) 01/01/2017   Stroke (HCC)    Syncope and collapse 04/15/2020   Systolic murmur     Past Surgical History:  Procedure Laterality Date   ABDOMINAL AORTIC ENDOVASCULAR STENT GRAFT N/A 09/06/2022   Procedure: ABDOMINAL AORTIC ENDOVASCULAR STENT GRAFT REPAIR (EVAR);  Surgeon: Victorino Sparrow, MD;  Location: [facility] OR;  Service: Vascular;  Laterality: N/A;   IR IMAGING GUIDED PORT INSERTION  04/26/2020   IR US GUIDE VASC ACCESS RIGHT  04/26/2020   MODIFIED MASTECTOMY Right 09/20/2020   Procedure: RIGHT MODIFIED RADICAL MASTECTOMY;  Surgeon: Harriette Bouillon, MD;  Location: [facility] OR;  Service: General;  Laterality: Right;   ULTRASOUND GUIDANCE FOR VASCULAR ACCESS Bilateral 09/06/2022   Procedure: ULTRASOUND GUIDANCE FOR VASCULAR ACCESS;  Surgeon: Victorino Sparrow, MD;  Location: [facility] OR;  Service: Vascular;  Laterality: Bilateral;    Social History   Socioeconomic History   Marital status: Widowed    Spouse name: Not on file    Number of children: Not on file   Years of education: Not on file   Highest education level: Not on file  Occupational History   Not on file  Tobacco Use   Smoking status: Former    Current packs/day: 0.00    Average packs/day: 1 pack/day for 50.0 years (50.0 ttl pk-yrs)    Types: Cigarettes    Start date: 01/01/1967    Quit date: 12/31/2016    Years since quitting: 5.9   Smokeless tobacco: Never  Vaping Use   Vaping status: Not on file  Substance and Sexual Activity   Alcohol use: No   Drug use: No   Sexual activity: Not on file  Other Topics Concern   Not on file  Social History Narrative   Not on file   Social Determinants of Health   Financial Resource Strain: Low Risk  (10/11/2022)   Overall Financial Resource Strain (CARDIA)    Difficulty of Paying Living Expenses: Not hard at all  Food Insecurity: No Food Insecurity (10/11/2022)   Hunger Vital Sign    Worried About Running Out of Food in the Last Year: Never true    Ran Out of Food in the Last Year: Never true  Transportation Needs: No Transportation Needs (10/11/2022)   PRAPARE - Administrator, Civil Service (Medical): No    Lack of Transportation (  Non-Medical): No  Physical Activity: Insufficiently Active (10/11/2022)   Exercise Vital Sign    Days of Exercise per Week: 7 days    Minutes of Exercise per Session: 20 min  Stress: No Stress Concern Present (10/11/2022)   [employer] of Occupational Health - Occupational Stress Questionnaire    Feeling of Stress : Not at all  Social Connections: Socially Isolated (10/11/2022)   Social Connection and Isolation Panel [NHANES]    Frequency of Communication with Friends and Family: More than three times a week    Frequency of Social Gatherings with Friends and Family: More than three times a week    Attends Religious Services: Never    Database administrator or Organizations: No    Attends Banker Meetings: Never    Marital Status: Widowed   Intimate Partner Violence: Not At Risk (10/11/2022)   Humiliation, Afraid, Rape, and Kick questionnaire    Fear of Current or Ex-Partner: No    Emotionally Abused: No    Physically Abused: No    Sexually Abused: No   *** Family History  Problem Relation Age of Onset   Diabetes Mother    Cancer Father    Lung cancer Father    COPD Sister    Breast cancer Sister    Diabetes Sister    COPD Sister    Cancer Brother    Diabetes Brother    Lung cancer Son     Current Outpatient Medications  Medication Sig Dispense Refill   albuterol (PROVENTIL) (2.5 MG/3ML) 0.083% nebulizer solution Take 1.5 mLs (1.25 mg total) by nebulization every 6 (six) hours as needed for wheezing or shortness of breath. 90 mL 12   aspirin EC 81 MG tablet Take 1 tablet (81 mg total) by mouth daily at 6 (six) AM. Swallow whole. 90 tablet 3   atorvastatin (LIPITOR) 20 MG tablet TAKE 1 TABLET BY MOUTH DAILY AT 6 PM. 90 tablet 2   cetirizine (ZYRTEC) 10 MG tablet Take 1 tablet (10 mg total) by mouth daily. 30 tablet 11   cholecalciferol (VITAMIN D3) 25 MCG (1000 UNIT) tablet Take 1 tablet (1,000 Units total) by mouth daily. 90 tablet 3   dexamethasone (DECADRON) 1 MG tablet Take 0.5 tablets (0.5 mg total) by mouth daily with breakfast. 30 tablet 1   feeding supplement (BOOST HIGH PROTEIN) LIQD Take 237 mLs by mouth 3 (three) times daily between meals. Please give her 90 bottles (Patient taking differently: Take 0.5 Containers by mouth in the morning and at bedtime. Please give her 90 bottles) 237 mL 6   letrozole (FEMARA) 2.5 MG tablet Take 1 tablet (2.5 mg total) by mouth daily. 90 tablet 3   lisinopril (ZESTRIL) 10 MG tablet Take 1 tablet (10 mg total) by mouth daily. 90 tablet 3   nystatin (MYCOSTATIN) 100000 UNIT/ML suspension Take 5 mLs (500,000 Units total) by mouth 4 (four) times daily. 60 mL 0   oxyCODONE-acetaminophen (PERCOCET) 5-325 MG tablet Take 1 tablet by mouth every 6 (six) hours as needed for severe  pain. 8 tablet 0   Tiotropium Bromide-Olodaterol (STIOLTO RESPIMAT) 2.5-2.5 MCG/ACT AERS Inhale 2 puffs into the lungs daily. 12 g 1   trazodone (DESYREL) 300 MG tablet Take 1 tablet (300 mg total) by mouth at bedtime. 90 tablet 1   vitamin B-12 (CYANOCOBALAMIN) 500 MCG tablet Take 2 tablets (1,000 mcg total) by mouth daily. 90 tablet 3   No current facility-administered medications for this visit.    Allergies  Allergen Reactions   Bee Venom     unknown     REVIEW OF SYSTEMS:  *** [X]  denotes positive finding, [ ]  denotes negative finding Cardiac  Comments:  Chest pain or chest pressure:    Shortness of breath upon exertion:    Short of breath when lying flat:    Irregular heart rhythm:        Vascular    Pain in calf, thigh, or hip brought on by ambulation:    Pain in feet at night that wakes you up from your sleep:     Blood clot in your veins:    Leg swelling:         Pulmonary    Oxygen at home:    Productive cough:     Wheezing:         Neurologic    Sudden weakness in arms or legs:     Sudden numbness in arms or legs:     Sudden onset of difficulty speaking or slurred speech:    Temporary loss of vision in one eye:     Problems with dizziness:         Gastrointestinal    Blood in stool:     Vomited blood:         Genitourinary    Burning when urinating:     Blood in urine:        Psychiatric    Major depression:         Hematologic    Bleeding problems:    Problems with blood clotting too easily:        Skin    Rashes or ulcers:        Constitutional    Fever or chills:      PHYSICAL EXAMINATION:  There were no vitals filed for this visit.  General:  WDWN in NAD; vital signs documented above Gait: Not observed HENT: WNL, normocephalic Pulmonary: normal non-labored breathing , without wheezing Cardiac: {Desc; regular/irreg:14544} HR Abdomen: soft, NT, no masses Skin: {With/Without:20273} rashes Vascular Exam/Pulses:  Right Left   Radial {Exam; arterial pulse strength 0-4:30167} {Exam; arterial pulse strength 0-4:30167}  Ulnar {Exam; arterial pulse strength 0-4:30167} {Exam; arterial pulse strength 0-4:30167}  Femoral {Exam; arterial pulse strength 0-4:30167} {Exam; arterial pulse strength 0-4:30167}  Popliteal {Exam; arterial pulse strength 0-4:30167} {Exam; arterial pulse strength 0-4:30167}  DP {Exam; arterial pulse strength 0-4:30167} {Exam; arterial pulse strength 0-4:30167}  PT {Exam; arterial pulse strength 0-4:30167} {Exam; arterial pulse strength 0-4:30167}   Extremities: {With/Without:20273} ischemic changes, {With/Without:20273} Gangrene , {With/Without:20273} cellulitis; {With/Without:20273} open wounds;  Musculoskeletal: no muscle wasting or atrophy  Neurologic: A&O X 3;  No focal weakness or paresthesias are detected Psychiatric:  The pt has {Desc; normal/abnormal:11317::"Normal"} affect.   Non-Invasive Vascular Imaging:   ***    ASSESSMENT/PLAN: CHANEY OLIGER is a 82 y.o. female presenting in follow-up status post TEVAR for symptomatic AAA.  Imaging was reviewed demonstrating excluded AAA.  No endoleak.  Aneurysm sac size smaller than previous.  Overall, Elizabeth Chase appears to be doing well.  I am happy with this repair.  My plan is to see her in *** years time with repeat imaging.   ***   Victorino Sparrow, MD [facility] [phone]

## 2022-11-29 ENCOUNTER — Encounter: Payer: Self-pay | Admitting: Vascular Surgery

## 2022-11-29 ENCOUNTER — Ambulatory Visit: Payer: Medicare HMO | Admitting: Vascular Surgery

## 2022-11-29 VITALS — BP 146/76 | HR 76 | Temp 98.0°F | Ht 63.0 in | Wt 134.9 lb

## 2022-11-29 DIAGNOSIS — Z9889 Other specified postprocedural states: Secondary | ICD-10-CM

## 2022-11-29 DIAGNOSIS — R6889 Other general symptoms and signs: Secondary | ICD-10-CM | POA: Diagnosis not present

## 2022-11-29 DIAGNOSIS — Z8679 Personal history of other diseases of the circulatory system: Secondary | ICD-10-CM

## 2022-12-10 ENCOUNTER — Ambulatory Visit (INDEPENDENT_AMBULATORY_CARE_PROVIDER_SITE_OTHER): Payer: Medicare HMO | Admitting: Nurse Practitioner

## 2022-12-10 ENCOUNTER — Encounter: Payer: Self-pay | Admitting: Nurse Practitioner

## 2022-12-10 VITALS — BP 132/57 | HR 78 | Ht 63.0 in | Wt 133.0 lb

## 2022-12-10 DIAGNOSIS — I10 Essential (primary) hypertension: Secondary | ICD-10-CM

## 2022-12-10 DIAGNOSIS — M25522 Pain in left elbow: Secondary | ICD-10-CM

## 2022-12-10 DIAGNOSIS — Z13220 Encounter for screening for lipoid disorders: Secondary | ICD-10-CM

## 2022-12-10 DIAGNOSIS — Z23 Encounter for immunization: Secondary | ICD-10-CM

## 2022-12-10 DIAGNOSIS — J302 Other seasonal allergic rhinitis: Secondary | ICD-10-CM

## 2022-12-10 MED ORDER — PREDNISONE 10 MG PO TABS: 10.0000 mg | ORAL_TABLET | Freq: Every day | ORAL | 0 refills | Status: DC

## 2022-12-10 MED ORDER — MONTELUKAST SODIUM 10 MG PO TABS: 10.0000 mg | ORAL_TABLET | Freq: Every day | ORAL | 3 refills | Status: DC

## 2022-12-10 MED ORDER — OLOPATADINE HCL 0.1 % OP SOLN: 1.0000 [drp] | Freq: Two times a day (BID) | OPHTHALMIC | 12 refills | Status: AC

## 2022-12-10 MED ORDER — PREDNISONE 10 MG PO TABS
10.0000 mg | ORAL_TABLET | Freq: Every day | ORAL | 0 refills | Status: AC
Start: 2022-12-10 — End: 2022-12-15

## 2022-12-10 MED ORDER — OLOPATADINE HCL 0.1 % OP SOLN
1.0000 [drp] | Freq: Two times a day (BID) | OPHTHALMIC | 12 refills | Status: DC
Start: 2022-12-10 — End: 2022-12-10

## 2022-12-10 NOTE — Patient Instructions (Addendum)
1. Seasonal allergies  - olopatadine (PATADAY) 0.1 % ophthalmic solution; Place 1 drop into both eyes 2 (two) times daily.  Dispense: 5 mL; Refill: 12 - predniSONE (DELTASONE) 10 MG tablet; Take 1 tablet (10 mg total) by mouth daily with breakfast for 5 days.  Dispense: 5 tablet; Refill: 0  2. Left elbow pain  - predniSONE (DELTASONE) 10 MG tablet; Take 1 tablet (10 mg total) by mouth daily with breakfast for 5 days.  Dispense: 5 tablet; Refill: 0  3. Lipid screening  - Lipid Panel  4. Essential hypertension  - CBC - Comprehensive metabolic panel  5. Need for influenza vaccination  - Flu Vaccine Trivalent High Dose (Fluad)  Follow up:  Follow up in 3 months

## 2022-12-10 NOTE — Progress Notes (Signed)
Subjective   Patient ID: Elizabeth Chase, female    DOB: [birth year], 82 y.o.   MRN: [id number]  Chief Complaint  Patient presents with   Follow-up    Referring provider: Ivonne Andrew, NP  Elizabeth Chase is a 82 y.o. female with Past Medical History: No date: AAA (abdominal aortic aneurysm) (HCC) No date: Abnormal brain MRI No date: Abnormal glucose level 04/15/2020: AKI (acute kidney injury) (HCC) No date: Anemia No date: Blood pressure abnormally low 05/13/2020: Breast wound 04/15/2020: Colitis 04/15/2020: COVID-19 virus infection No date: Essential hypertension 05/13/2020: Homelessness No date: Hyperlipidemia No date: Hypertension No date: Hypocalcemia No date: Malignant neoplasm of right breast in female, estrogen  receptor positive (HCC) No date: Muscle weakness No date: Neoplasm of breast, female, malignant (HCC) 05/13/2020: Port-A-Cath in place 04/15/2020: Prolonged QT interval 01/01/2017: Right thalamic infarction [facility]) No date: Stroke (HCC) 04/15/2020: Syncope and collapse No date: Systolic murmur   HPI  Patient presents today for follow-up visit.  Overall she has been doing well since her last visit here.  She has followed with oncology and vascular.  Patient does complain today of some left elbow pain.  We will trial prednisone.  Patient is due for blood work including lipid panel today.  Patient does complain of allergies.  We will start her on Singulair at bedtime and Pataday eyedrops.  Denies f/c/s, n/v/d, hemoptysis, PND, leg swelling Denies chest pain or edema     Allergies  Allergen Reactions   Bee Venom     unknown    Immunization History  Administered Date(s) Administered   Fluad Quad(high Dose 65+) 12/15/2020, 10/04/2021   PNEUMOCOCCAL CONJUGATE-20 10/04/2021   Zoster Recombinant(Shingrix) 10/04/2021    Tobacco History: Social History   Tobacco Use  Smoking Status Former   Current packs/day: 0.00   Average packs/day: 1 pack/day  for 50.0 years (50.0 ttl pk-yrs)   Types: Cigarettes   Start date: 01/01/1967   Quit date: 12/31/2016   Years since quitting: 5.9  Smokeless Tobacco Never   Counseling given: Not Answered   Outpatient Encounter Medications as of 12/10/2022  Medication Sig   albuterol (PROVENTIL) (2.5 MG/3ML) 0.083% nebulizer solution Take 1.5 mLs (1.25 mg total) by nebulization every 6 (six) hours as needed for wheezing or shortness of breath.   aspirin EC 81 MG tablet Take 1 tablet (81 mg total) by mouth daily at 6 (six) AM. Swallow whole.   atorvastatin (LIPITOR) 20 MG tablet TAKE 1 TABLET BY MOUTH DAILY AT 6 PM.   cetirizine (ZYRTEC) 10 MG tablet Take 1 tablet (10 mg total) by mouth daily.   cholecalciferol (VITAMIN D3) 25 MCG (1000 UNIT) tablet Take 1 tablet (1,000 Units total) by mouth daily.   dexamethasone (DECADRON) 1 MG tablet Take 0.5 tablets (0.5 mg total) by mouth daily with breakfast.   feeding supplement (BOOST HIGH PROTEIN) LIQD Take 237 mLs by mouth 3 (three) times daily between meals. Please give her 90 bottles (Patient taking differently: Take 0.5 Containers by mouth in the morning and at bedtime. Please give her 90 bottles)   letrozole (FEMARA) 2.5 MG tablet Take 1 tablet (2.5 mg total) by mouth daily.   lisinopril (ZESTRIL) 10 MG tablet Take 1 tablet (10 mg total) by mouth daily.   montelukast (SINGULAIR) 10 MG tablet Take 1 tablet (10 mg total) by mouth at bedtime.   nystatin (MYCOSTATIN) 100000 UNIT/ML suspension Take 5 mLs (500,000 Units total) by mouth 4 (four) times daily.   oxyCODONE-acetaminophen (PERCOCET)  5-325 MG tablet Take 1 tablet by mouth every 6 (six) hours as needed for severe pain.   Tiotropium Bromide-Olodaterol (STIOLTO RESPIMAT) 2.5-2.5 MCG/ACT AERS Inhale 2 puffs into the lungs daily.   trazodone (DESYREL) 300 MG tablet Take 1 tablet (300 mg total) by mouth at bedtime.   vitamin B-12 (CYANOCOBALAMIN) 500 MCG tablet Take 2 tablets (1,000 mcg total) by mouth daily.    [DISCONTINUED] olopatadine (PATADAY) 0.1 % ophthalmic solution Place 1 drop into both eyes 2 (two) times daily.   [DISCONTINUED] predniSONE (DELTASONE) 10 MG tablet Take 1 tablet (10 mg total) by mouth daily with breakfast for 5 days.   olopatadine (PATADAY) 0.1 % ophthalmic solution Place 1 drop into both eyes 2 (two) times daily.   predniSONE (DELTASONE) 10 MG tablet Take 1 tablet (10 mg total) by mouth daily with breakfast for 5 days.   No facility-administered encounter medications on file as of 12/10/2022.    Review of Systems  Review of Systems  Constitutional: Negative.   HENT: Negative.    Cardiovascular: Negative.   Gastrointestinal: Negative.   Allergic/Immunologic: Negative.   Neurological: Negative.   Psychiatric/Behavioral: Negative.       Objective:   BP (!) 132/57 (BP Location: Left Arm, Patient Position: Sitting, Cuff Size: Normal)   Pulse 78   Ht 5\' 3"  (1.6 m)   Wt 133 lb (60.3 kg)   SpO2 95%   BMI 23.56 kg/m   Wt Readings from Last 5 Encounters:  12/10/22 133 lb (60.3 kg)  11/29/22 134 lb 14.4 oz (61.2 kg)  10/11/22 128 lb (58.1 kg)  09/13/22 128 lb 9.6 oz (58.3 kg)  09/06/22 130 lb 4.7 oz (59.1 kg)     Physical Exam Vitals and nursing note reviewed.  Constitutional:      General: She is not in acute distress.    Appearance: She is well-developed.  Cardiovascular:     Rate and Rhythm: Normal rate and regular rhythm.  Pulmonary:     Effort: Pulmonary effort is normal.     Breath sounds: Normal breath sounds.  Neurological:     Mental Status: She is alert and oriented to person, place, and time.       Assessment & Plan:   Seasonal allergies -     Olopatadine HCl; Place 1 drop into both eyes 2 (two) times daily.  Dispense: 5 mL; Refill: 12 -     predniSONE; Take 1 tablet (10 mg total) by mouth daily with breakfast for 5 days.  Dispense: 5 tablet; Refill: 0  Left elbow pain -     predniSONE; Take 1 tablet (10 mg total) by mouth daily with  breakfast for 5 days.  Dispense: 5 tablet; Refill: 0  Lipid screening -     Lipid panel  Essential hypertension -     CBC -     Comprehensive metabolic panel  Need for influenza vaccination -     Flu Vaccine Trivalent High Dose (Fluad)  Other orders -     Montelukast Sodium; Take 1 tablet (10 mg total) by mouth at bedtime.  Dispense: 30 tablet; Refill: 3     No follow-ups on file.   Ivonne Andrew, NP 12/10/2022

## 2022-12-11 LAB — LIPID PANEL
Chol/HDL Ratio: 2.8 ratio (ref 0.0–4.4)
Cholesterol, Total: 101 mg/dL (ref 100–199)
HDL: 36 mg/dL — ABNORMAL LOW (ref >39)
LDL Chol Calc (NIH): 42 mg/dL (ref 0–99)
Triglycerides: 127 mg/dL (ref 0–149)
VLDL Cholesterol Cal: 23 mg/dL (ref 5–40)

## 2022-12-11 LAB — COMPREHENSIVE METABOLIC PANEL
ALT: 11 [IU]/L (ref 0–32)
AST: 12 [IU]/L (ref 0–40)
Albumin: 4.1 g/dL (ref 3.7–4.7)
Alkaline Phosphatase: 93 [IU]/L (ref 44–121)
BUN/Creatinine Ratio: 20 (ref 12–28)
BUN: 17 mg/dL (ref 8–27)
Bilirubin Total: 0.4 mg/dL (ref 0.0–1.2)
CO2: 20 mmol/L (ref 20–29)
Calcium: 9.1 mg/dL (ref 8.7–10.3)
Chloride: 106 mmol/L (ref 96–106)
Creatinine, Ser: 0.85 mg/dL (ref 0.57–1.00)
Globulin, Total: 2.4 g/dL (ref 1.5–4.5)
Glucose: 90 mg/dL (ref 70–99)
Potassium: 4.2 mmol/L (ref 3.5–5.2)
Sodium: 143 mmol/L (ref 134–144)
Total Protein: 6.5 g/dL (ref 6.0–8.5)
eGFR: 68 mL/min/{1.73_m2} (ref >59)

## 2022-12-11 LAB — CBC
Hematocrit: 36.9 % (ref 34.0–46.6)
Hemoglobin: 11.8 g/dL (ref 11.1–15.9)
MCH: 29.7 pg (ref 26.6–33.0)
MCHC: 32 g/dL (ref 31.5–35.7)
MCV: 93 fL (ref 79–97)
Platelets: 145 10*3/uL — ABNORMAL LOW (ref 150–450)
RBC: 3.97 x10E6/uL (ref 3.77–5.28)
RDW: 13.8 % (ref 11.7–15.4)
WBC: 5.1 10*3/uL (ref 3.4–10.8)

## 2022-12-18 DIAGNOSIS — R32 Unspecified urinary incontinence: Secondary | ICD-10-CM | POA: Diagnosis not present

## 2022-12-25 ENCOUNTER — Other Ambulatory Visit: Payer: Self-pay

## 2022-12-25 DIAGNOSIS — I714 Abdominal aortic aneurysm, without rupture, unspecified: Secondary | ICD-10-CM

## 2023-01-01 ENCOUNTER — Ambulatory Visit: Payer: Medicare HMO | Attending: Nurse Practitioner | Admitting: Audiologist

## 2023-01-01 DIAGNOSIS — H903 Sensorineural hearing loss, bilateral: Secondary | ICD-10-CM | POA: Diagnosis not present

## 2023-01-01 DIAGNOSIS — R6889 Other general symptoms and signs: Secondary | ICD-10-CM | POA: Diagnosis not present

## 2023-01-01 NOTE — Procedures (Signed)
  Outpatient Audiology and [facility] [address] [place], [place]  [address] [phone]  AUDIOLOGICAL  EVALUATION  NAME: Elizabeth Chase     DOB:   12/14/1940      MRN: [id number]                                                                                     DATE: 01/01/2023     REFERENT: Ivonne Andrew, NP STATUS: Outpatient DIAGNOSIS: Sensorineural hearing loss bilateral  History: [name] was seen for an audiological evaluation due to difficulty hearing people clearly.  Her daughter accompanied her to the appointment today.  Daughter says that it feels like [name] can hear but she cannot understand what people are saying.  1 to has constant high-pitched tinnitus in both ears.  She denies any pain or pressure in either ear.  She denies any dizziness.  There is no history of hazardous noise exposure.   Evaluation:  Otoscopy showed a clear view of the tympanic membranes, bilaterally Tympanometry results were consistent with normal middle ear function bilaterally Audiometric testing was completed using Conventional Audiometry techniques with insert earphones and TDH headphones. Test results are consistent with mild sloping to severe sensorineural hearing loss bilaterally. Speech Recognition Thresholds were obtained at 40 dB HL in the right ear and at 55 dB HL in the left ear. Word Recognition Testing was completed at 40dB SL and [name] scored 80% in the right ear and 76% in the left ear.  Results:  The test results were reviewed with [name] and her daughter. [name] has mild to severe sloping sensorineural hearing loss bilaterally.  She has good ability to understand speech at loud levels, making her a good hearing aid candidate.  Results of hearing test and audiogram explained to patient.  Daughter given 2 copies of the audiogram.   Recommendations: 1.   Hearing aids recommended for both ears. Patient already wants to be seen at [facility]. Daughter given two  copies of the audiogram to share with this practice.    37 minutes spent testing and counseling on results.   If you have any questions please feel free to contact me at [phone].  Ammie Ferrier Au.D.  Audiologist   01/01/2023  1:38 PM  Cc: Ivonne Andrew, NP

## 2023-01-14 ENCOUNTER — Other Ambulatory Visit: Payer: Self-pay | Admitting: Internal Medicine

## 2023-01-21 ENCOUNTER — Encounter: Payer: Self-pay | Admitting: Internal Medicine

## 2023-01-21 ENCOUNTER — Other Ambulatory Visit: Payer: Self-pay | Admitting: Hematology and Oncology

## 2023-02-11 ENCOUNTER — Ambulatory Visit: Payer: Medicare HMO | Admitting: Internal Medicine

## 2023-02-11 ENCOUNTER — Encounter: Payer: Self-pay | Admitting: Internal Medicine

## 2023-03-08 ENCOUNTER — Other Ambulatory Visit: Payer: Self-pay | Admitting: Nurse Practitioner

## 2023-04-11 ENCOUNTER — Ambulatory Visit (INDEPENDENT_AMBULATORY_CARE_PROVIDER_SITE_OTHER): Payer: Medicare HMO | Admitting: Nurse Practitioner

## 2023-04-11 ENCOUNTER — Encounter: Payer: Self-pay | Admitting: Nurse Practitioner

## 2023-04-11 VITALS — BP 137/71 | HR 76 | Temp 97.3°F | Wt 135.0 lb

## 2023-04-11 DIAGNOSIS — I10 Essential (primary) hypertension: Secondary | ICD-10-CM | POA: Diagnosis not present

## 2023-04-11 DIAGNOSIS — K219 Gastro-esophageal reflux disease without esophagitis: Secondary | ICD-10-CM | POA: Diagnosis not present

## 2023-04-11 DIAGNOSIS — Z13220 Encounter for screening for lipoid disorders: Secondary | ICD-10-CM

## 2023-04-11 MED ORDER — OMEPRAZOLE 20 MG PO CPDR
20.0000 mg | DELAYED_RELEASE_CAPSULE | Freq: Every day | ORAL | 3 refills | Status: DC
Start: 2023-04-11 — End: 2023-07-15

## 2023-04-11 NOTE — Progress Notes (Signed)
 Subjective   Patient ID: Elizabeth Chase, female    DOB: 19-Jan-1941, 83 y.o.   MRN: [id number]  Chief Complaint  Patient presents with   Medical Management of Chronic Issues    Referring provider: Ivonne Andrew, NP  Elizabeth Chase is a 83 y.o. female with Past Medical History: No date: AAA (abdominal aortic aneurysm) (HCC) No date: Abnormal brain MRI No date: Abnormal glucose level 04/15/2020: AKI (acute kidney injury) (HCC) No date: Anemia No date: Blood pressure abnormally low 05/13/2020: Breast wound 04/15/2020: Colitis 04/15/2020: COVID-19 virus infection No date: Essential hypertension 05/13/2020: Homelessness No date: Hyperlipidemia No date: Hypertension No date: Hypocalcemia No date: Malignant neoplasm of right breast in female, estrogen  receptor positive (HCC) No date: Muscle weakness No date: Neoplasm of breast, female, malignant (HCC) 05/13/2020: Port-A-Cath in place 04/15/2020: Prolonged QT interval 01/01/2017: Right thalamic infarction [facility]) No date: Stroke (HCC) 04/15/2020: Syncope and collapse No date: Systolic murmur   HPI  Patient presents today for a follow-up visit.  Overall she is doing well.  She continues to follow with vascular and oncology.  Patient is due for labs today.  She would like started on omeprazole for reflux. Denies f/c/s, n/v/d, hemoptysis, PND, leg swelling Denies chest pain or edema      Allergies  Allergen Reactions   Bee Venom     unknown    Immunization History  Administered Date(s) Administered   Fluad Quad(high Dose 65+) 12/15/2020, 10/04/2021   Fluad Trivalent(High Dose 65+) 12/10/2022   PNEUMOCOCCAL CONJUGATE-20 10/04/2021   Zoster Recombinant(Shingrix) 10/04/2021    Tobacco History: Social History   Tobacco Use  Smoking Status Former   Current packs/day: 0.00   Average packs/day: 1 pack/day for 50.0 years (50.0 ttl pk-yrs)   Types: Cigarettes   Start date: 01/01/1967   Quit date: 12/31/2016    Years since quitting: 6.2  Smokeless Tobacco Never   Counseling given: Not Answered   Outpatient Encounter Medications as of 04/11/2023  Medication Sig   albuterol (PROVENTIL) (2.5 MG/3ML) 0.083% nebulizer solution Take 1.5 mLs (1.25 mg total) by nebulization every 6 (six) hours as needed for wheezing or shortness of breath.   aspirin EC 81 MG tablet Take 1 tablet (81 mg total) by mouth daily at 6 (six) AM. Swallow whole.   atorvastatin (LIPITOR) 20 MG tablet TAKE 1 TABLET BY MOUTH DAILY AT 6 PM.   cetirizine (ZYRTEC) 10 MG tablet Take 1 tablet (10 mg total) by mouth daily.   cholecalciferol (VITAMIN D3) 25 MCG (1000 UNIT) tablet Take 1 tablet (1,000 Units total) by mouth daily.   dexamethasone (DECADRON) 1 MG tablet Take 0.5 tablets (0.5 mg total) by mouth daily with breakfast.   feeding supplement (BOOST HIGH PROTEIN) LIQD Take 237 mLs by mouth 3 (three) times daily between meals. Please give her 90 bottles (Patient taking differently: Take 0.5 Containers by mouth in the morning and at bedtime. Please give her 90 bottles)   letrozole (FEMARA) 2.5 MG tablet Take 1 tablet (2.5 mg total) by mouth daily.   lisinopril (ZESTRIL) 10 MG tablet Take 1 tablet (10 mg total) by mouth daily.   nystatin (MYCOSTATIN) 100000 UNIT/ML suspension Take 5 mLs (500,000 Units total) by mouth 4 (four) times daily.   omeprazole (PRILOSEC) 20 MG capsule Take 1 capsule (20 mg total) by mouth daily.   Tiotropium Bromide-Olodaterol (STIOLTO RESPIMAT) 2.5-2.5 MCG/ACT AERS INHALE 2 PUFFS INTO THE LUNGS DAILY.   trazodone (DESYREL) 300 MG tablet TAKE 1 TABLET  AT BEDTIME   vitamin B-12 (CYANOCOBALAMIN) 500 MCG tablet Take 2 tablets (1,000 mcg total) by mouth daily.   montelukast (SINGULAIR) 10 MG tablet TAKE 1 TABLET BY MOUTH EVERYDAY AT BEDTIME (Patient not taking: Reported on 04/11/2023)   olopatadine (PATADAY) 0.1 % ophthalmic solution Place 1 drop into both eyes 2 (two) times daily. (Patient not taking: Reported on  04/11/2023)   oxyCODONE-acetaminophen (PERCOCET) 5-325 MG tablet Take 1 tablet by mouth every 6 (six) hours as needed for severe pain. (Patient not taking: Reported on 04/11/2023)   No facility-administered encounter medications on file as of 04/11/2023.    Review of Systems  Review of Systems  Constitutional: Negative.   HENT: Negative.    Cardiovascular: Negative.   Gastrointestinal: Negative.   Allergic/Immunologic: Negative.   Neurological: Negative.   Psychiatric/Behavioral: Negative.       Objective:   BP 137/71   Pulse 76   Temp (!) 97.3 F (36.3 C)   Wt 135 lb (61.2 kg)   SpO2 95%   BMI 23.91 kg/m   Wt Readings from Last 5 Encounters:  04/11/23 135 lb (61.2 kg)  12/10/22 133 lb (60.3 kg)  11/29/22 134 lb 14.4 oz (61.2 kg)  10/11/22 128 lb (58.1 kg)  09/13/22 128 lb 9.6 oz (58.3 kg)     Physical Exam Vitals and nursing note reviewed.  Constitutional:      General: She is not in acute distress.    Appearance: She is well-developed.  Cardiovascular:     Rate and Rhythm: Normal rate and regular rhythm.  Pulmonary:     Effort: Pulmonary effort is normal.     Breath sounds: Normal breath sounds.  Neurological:     Mental Status: She is alert and oriented to person, place, and time.       Assessment & Plan:   Gastroesophageal reflux disease without esophagitis -     Omeprazole; Take 1 capsule (20 mg total) by mouth daily.  Dispense: 30 capsule; Refill: 3  Lipid screening -     Lipid panel  Essential hypertension -     CBC -     Comprehensive metabolic panel     Return in about 6 months (around 10/09/2023).   Ivonne Andrew, NP 04/11/2023

## 2023-04-11 NOTE — Patient Instructions (Signed)
 1. Gastroesophageal reflux disease without esophagitis (Primary)  - omeprazole (PRILOSEC) 20 MG capsule; Take 1 capsule (20 mg total) by mouth daily.  Dispense: 30 capsule; Refill: 3  2. Lipid screening  - Lipid Panel  3. Essential hypertension  - CBC - Comprehensive metabolic panel  Follow up:  Follow up in 6 months

## 2023-04-12 DIAGNOSIS — R32 Unspecified urinary incontinence: Secondary | ICD-10-CM | POA: Diagnosis not present

## 2023-04-12 LAB — COMPREHENSIVE METABOLIC PANEL
ALT: 16 [IU]/L (ref 0–32)
AST: 16 [IU]/L (ref 0–40)
Albumin: 4 g/dL (ref 3.7–4.7)
Alkaline Phosphatase: 96 [IU]/L (ref 44–121)
BUN/Creatinine Ratio: 18 (ref 12–28)
BUN: 14 mg/dL (ref 8–27)
Bilirubin Total: 0.3 mg/dL (ref 0.0–1.2)
CO2: 23 mmol/L (ref 20–29)
Calcium: 8.9 mg/dL (ref 8.7–10.3)
Chloride: 106 mmol/L (ref 96–106)
Creatinine, Ser: 0.78 mg/dL (ref 0.57–1.00)
Globulin, Total: 2.5 g/dL (ref 1.5–4.5)
Glucose: 131 mg/dL — ABNORMAL HIGH (ref 70–99)
Potassium: 4.6 mmol/L (ref 3.5–5.2)
Sodium: 144 mmol/L (ref 134–144)
Total Protein: 6.5 g/dL (ref 6.0–8.5)
eGFR: 75 mL/min/{1.73_m2} (ref >59)

## 2023-04-12 LAB — LIPID PANEL
Chol/HDL Ratio: 2.7 {ratio} (ref 0.0–4.4)
Cholesterol, Total: 85 mg/dL — ABNORMAL LOW (ref 100–199)
HDL: 32 mg/dL — ABNORMAL LOW (ref >39)
LDL Chol Calc (NIH): 24 mg/dL (ref 0–99)
Triglycerides: 175 mg/dL — ABNORMAL HIGH (ref 0–149)
VLDL Cholesterol Cal: 29 mg/dL (ref 5–40)

## 2023-04-12 LAB — CBC
Hematocrit: 36.1 % (ref 34.0–46.6)
Hemoglobin: 12.2 g/dL (ref 11.1–15.9)
MCH: 29.7 pg (ref 26.6–33.0)
MCHC: 33.8 g/dL (ref 31.5–35.7)
MCV: 88 fL (ref 79–97)
Platelets: 141 10*3/uL — ABNORMAL LOW (ref 150–450)
RBC: 4.11 x10E6/uL (ref 3.77–5.28)
RDW: 13.5 % (ref 11.7–15.4)
WBC: 5.2 10*3/uL (ref 3.4–10.8)

## 2023-04-13 ENCOUNTER — Other Ambulatory Visit: Payer: Self-pay | Admitting: Hematology and Oncology

## 2023-04-17 ENCOUNTER — Inpatient Hospital Stay: Payer: Medicare HMO | Attending: Hematology and Oncology | Admitting: Hematology and Oncology

## 2023-04-17 VITALS — BP 177/81 | HR 77 | Temp 98.4°F | Resp 20 | Ht 63.0 in | Wt 134.5 lb

## 2023-04-17 DIAGNOSIS — C50011 Malignant neoplasm of nipple and areola, right female breast: Secondary | ICD-10-CM

## 2023-04-17 DIAGNOSIS — R0602 Shortness of breath: Secondary | ICD-10-CM | POA: Insufficient documentation

## 2023-04-17 DIAGNOSIS — Z79811 Long term (current) use of aromatase inhibitors: Secondary | ICD-10-CM | POA: Diagnosis not present

## 2023-04-17 DIAGNOSIS — Z923 Personal history of irradiation: Secondary | ICD-10-CM | POA: Insufficient documentation

## 2023-04-17 DIAGNOSIS — M810 Age-related osteoporosis without current pathological fracture: Secondary | ICD-10-CM | POA: Insufficient documentation

## 2023-04-17 DIAGNOSIS — Z1731 Human epidermal growth factor receptor 2 positive status: Secondary | ICD-10-CM | POA: Insufficient documentation

## 2023-04-17 DIAGNOSIS — Z170 Estrogen receptor positive status [ER+]: Secondary | ICD-10-CM | POA: Insufficient documentation

## 2023-04-17 DIAGNOSIS — C773 Secondary and unspecified malignant neoplasm of axilla and upper limb lymph nodes: Secondary | ICD-10-CM | POA: Diagnosis not present

## 2023-04-17 DIAGNOSIS — Z1721 Progesterone receptor positive status: Secondary | ICD-10-CM | POA: Insufficient documentation

## 2023-04-17 DIAGNOSIS — C50911 Malignant neoplasm of unspecified site of right female breast: Secondary | ICD-10-CM | POA: Insufficient documentation

## 2023-04-17 DIAGNOSIS — Z79899 Other long term (current) drug therapy: Secondary | ICD-10-CM | POA: Diagnosis not present

## 2023-04-17 NOTE — Progress Notes (Signed)
 Patient Care Team: Ivonne Andrew, NP as PCP - General (Pulmonary Disease) Corky Crafts, MD as PCP - Cardiology (Cardiology) Pershing Proud, RN as Oncology Nurse Navigator Donnelly Angelica, RN as Oncology Nurse Navigator Harriette Bouillon, MD as Consulting Physician (General Surgery) Serena Croissant, MD as Consulting Physician (Hematology and Oncology)  DIAGNOSIS:  Encounter Diagnosis  Name Primary?   Malignant neoplasm involving both nipple and areola of right breast in female, estrogen receptor positive (HCC) Yes    SUMMARY OF ONCOLOGIC HISTORY: Oncology History  Malignant neoplasm of right breast in female, estrogen receptor positive (HCC)  04/18/2020 Initial Diagnosis   Patient presented to the ED with complaint of syncope and found to have a fungating right breast mass. CT showed a right breast mass, 8.1cm, and right axillary adenopathy. Patient reported a right breast mass present for past 2-3 years. Biopsy showed IDC, grade 3, HER-2 equivocal by IHC, positive by FISH (ratio 3.56), ER+ 95%, PR+ 70%, ,Ki67 25%.    04/18/2020 Cancer Staging   Staging form: Breast, AJCC 8th Edition - Clinical stage from 04/18/2020: Stage IIIB (cT4b, cN1, cM0, G3, ER+, PR+, HER2+) - Signed by Loa Socks, NP on 04/27/2020 Stage prefix: Initial diagnosis Histologic grading system: 3 grade system   05/13/2020 - 08/29/2020 Chemotherapy   Neoadjuvant chemo with Taxotere Herceptin and Perjeta x6 cycles       10/11/2020 - 05/18/2021 Chemotherapy   Patient is on Treatment Plan : BREAST ADO-Trastuzumab Emtansine (Kadcyla) q21d     12/27/2020 - 02/10/2021 Radiation Therapy   Site Technique Total Dose (Gy) Dose per Fx (Gy) Completed Fx Beam Energies  Chest Wall, Right: CW_Rt_IMN 3D 50/50 2 25/25 6XFFF  Chest Wall, Right: CW_Rt_PAB_SCV 3D 50/50 2 25/25 6X, 10X  Chest Wall, Right: CW_Rt_Bst Electron 10/10 2 5/5 6E     04/27/2021 -  Anti-estrogen oral therapy   Letrozole x 5 years      CHIEF COMPLIANT: Follow-up on left shoulder.  HISTORY OF PRESENT ILLNESS:   History of Present Illness The patient, with a history of heart issues and an aneurysm repaired last year, presents with a cramping and burning sensation. The discomfort is localized to the area of a scar, which is tucking into the muscle, causing spasms. The patient also reports respiratory issues, for which she has seen lung doctors. She is currently on an inhaler, Stiletto, which seems to be helping. The patient has been on letrozole for the past three years and recently started omeprazole. The patient's spouse mentions that the patient had gained all her weight back, indicating a possible previous issue with weight loss.     ALLERGIES:  is allergic to bee venom.  MEDICATIONS:  Current Outpatient Medications  Medication Sig Dispense Refill   albuterol (PROVENTIL) (2.5 MG/3ML) 0.083% nebulizer solution Take 1.5 mLs (1.25 mg total) by nebulization every 6 (six) hours as needed for wheezing or shortness of breath. 90 mL 12   aspirin EC 81 MG tablet Take 1 tablet (81 mg total) by mouth daily at 6 (six) AM. Swallow whole. 90 tablet 3   atorvastatin (LIPITOR) 20 MG tablet TAKE 1 TABLET BY MOUTH DAILY AT 6 PM. 90 tablet 2   cetirizine (ZYRTEC) 10 MG tablet Take 1 tablet (10 mg total) by mouth daily. 30 tablet 11   cholecalciferol (VITAMIN D3) 25 MCG (1000 UNIT) tablet Take 1 tablet (1,000 Units total) by mouth daily. 90 tablet 3   letrozole (FEMARA) 2.5 MG tablet Take 1 tablet (2.5  mg total) by mouth daily. 90 tablet 3   lisinopril (ZESTRIL) 10 MG tablet TAKE 1 TABLET BY MOUTH EVERY DAY 90 tablet 3   metFORMIN (GLUCOPHAGE) 1000 MG tablet 500 mg.     nystatin (MYCOSTATIN) 100000 UNIT/ML suspension Take 5 mLs (500,000 Units total) by mouth 4 (four) times daily. 60 mL 0   omeprazole (PRILOSEC) 20 MG capsule Take 1 capsule (20 mg total) by mouth daily. 30 capsule 3   Tiotropium Bromide-Olodaterol (STIOLTO RESPIMAT)  2.5-2.5 MCG/ACT AERS INHALE 2 PUFFS INTO THE LUNGS DAILY. 12 g 1   trazodone (DESYREL) 300 MG tablet TAKE 1 TABLET AT BEDTIME 90 tablet 3   vitamin B-12 (CYANOCOBALAMIN) 500 MCG tablet Take 2 tablets (1,000 mcg total) by mouth daily. 90 tablet 3   feeding supplement (BOOST HIGH PROTEIN) LIQD Take 237 mLs by mouth 3 (three) times daily between meals. Please give her 90 bottles (Patient not taking: Reported on 04/17/2023) 237 mL 6   montelukast (SINGULAIR) 10 MG tablet TAKE 1 TABLET BY MOUTH EVERYDAY AT BEDTIME (Patient not taking: Reported on 04/17/2023) 90 tablet 1   olopatadine (PATADAY) 0.1 % ophthalmic solution Place 1 drop into both eyes 2 (two) times daily. (Patient not taking: Reported on 04/17/2023) 5 mL 12   No current facility-administered medications for this visit.    PHYSICAL EXAMINATION: ECOG PERFORMANCE STATUS: 1 - Symptomatic but completely ambulatory  Vitals:   04/17/23 1006  BP: (!) 177/81  Pulse: 77  Resp: 20  Temp: 98.4 F (36.9 C)  SpO2: 96%   Filed Weights   04/17/23 1006  Weight: 134 lb 8 oz (61 kg)    Physical Exam MUSCULOSKELETAL: Scar tucking into shoulder muscle.  (exam performed in the presence of a chaperone)  LABORATORY DATA:  I have reviewed the data as listed    Latest Ref Rng & Units 04/11/2023    1:46 PM 12/10/2022   10:31 AM 09/07/2022    3:10 AM  CMP  Glucose 70 - 99 mg/dL 409  90  811   BUN 8 - 27 mg/dL 14  17  10    Creatinine 0.57 - 1.00 mg/dL 9.14  7.82  9.56   Sodium 134 - 144 mmol/L 144  143  139   Potassium 3.5 - 5.2 mmol/L 4.6  4.2  3.3   Chloride 96 - 106 mmol/L 106  106  110   CO2 20 - 29 mmol/L 23  20  22    Calcium 8.7 - 10.3 mg/dL 8.9  9.1  8.2   Total Protein 6.0 - 8.5 g/dL 6.5  6.5    Total Bilirubin 0.0 - 1.2 mg/dL 0.3  0.4    Alkaline Phos 44 - 121 IU/L 96  93    AST 0 - 40 IU/L 16  12    ALT 0 - 32 IU/L 16  11      Lab Results  Component Value Date   WBC 5.2 04/11/2023   HGB 12.2 04/11/2023   HCT 36.1 04/11/2023    MCV 88 04/11/2023   PLT 141 (L) 04/11/2023   NEUTROABS 4.3 09/06/2022    ASSESSMENT & PLAN:  Malignant neoplasm of right breast in female, estrogen receptor positive (HCC) 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 Neoadj chemo with Taxotere Herceptin Perjeta x6 cycles completed 08/26/2020 followed by Daiva Huge maintenance 2. Mastectomy with sentinel node biopsy: 09/20/2020: Residual invasive ductal carcinoma 7.4  cm invades dermis and skin and skeletal muscle, 4/14 lymph nodes positive, margins negative, lymphovascular invasion present, ER 95%, PR 20%, HER2 positive, Ki-67 25% 3. Adj XRT completed 02/10/2021 4. Adj Anti estrogen therapy with letrozole started 04/27/2021 -------------------------------------------------------------------------------------------------- Current treatment: Letrozole   Letrozole toxicities: Tolerating letrozole extremely well without any problems or concerns. Bone Density 04/26/22: T score -2.6: Osteoporosis    She has moved to a new apartment and appears to be doing quite well. Shortness of breath: We made a referral to pulmonary 09/06/2022: Infrarenal Aneurysm repair  Breast Cancer Surveillance:  1. Left Mammograms: 10/19/22: Benign, density Cat B 2. Breast Exam: 04/17/23: benign  Return to clinic next year for follow-up. ------------------------------------- Assessment and Plan Assessment & Plan Breast Cancer Condition well-managed on letrozole with good tolerance. - Continue letrozole therapy. - Schedule follow-up in one year.  Post-surgical Scar Tissue Muscle spasms due to scar tissue causing discomfort. - Manage discomfort as needed.  Respiratory Issues Improvement with Stiolto inhaler, indicating effective management. - Continue Stiolto inhaler as prescribed.      No orders of the defined types were placed in this encounter.  The patient has a good  understanding of the overall plan. she agrees with it. she will call with any problems that may develop before the next visit here. Total time spent: 30 mins including face to face time and time spent for planning, charting and co-ordination of care   Tamsen Meek, MD 04/17/23

## 2023-04-17 NOTE — Assessment & Plan Note (Addendum)
 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 Neoadj chemo with Taxotere Herceptin Perjeta x6 cycles completed 08/26/2020 followed by Daiva Huge maintenance 2. 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 Adj XRT completed 02/10/2021 4. Adj Anti estrogen therapy with letrozole started 04/27/2021 -------------------------------------------------------------------------------------------------- Current treatment: Letrozole   Letrozole toxicities: Tolerating letrozole extremely well without any problems or concerns. Bone Density 04/26/22: T score -2.6: Osteoporosis    She has moved to a new apartment and appears to be doing quite well. Shortness of breath: We made a referral to pulmonary 09/06/2022: Infrarenal Aneurysm repair  Breast Cancer Surveillance:  1. Left Mammograms: 10/19/22: Benign, density Cat B 2. Breast Exam: 04/17/23: benign  Return to clinic next year for follow-up.

## 2023-05-09 ENCOUNTER — Telehealth: Payer: Self-pay | Admitting: *Deleted

## 2023-05-09 NOTE — Telephone Encounter (Signed)
 Copied from CRM [id number]. Topic: Clinical - Request for Lab/Test Order >> May 09, 2023  4:34 PM Konrad Dolores wrote: Reason for CRM: Patient attempting to reschedule PFT, however the PFT order expires on 3.28.2025 and I was unable to find an appointment before the order expires. Requesting an order for a new PFT so the patient can schedule.  Front staff, this patient was last seen on 05/10/22, she has had almost a year to schedule this PFT and it could have been scheduled when she checked out.  She currently does not have a scheduled follow up to see Dr. Celine Mans.  Please call and schedule patient for follow up and then we can change order when we know she is coming in for follow up.  Thank you.

## 2023-05-30 ENCOUNTER — Ambulatory Visit: Admitting: Internal Medicine

## 2023-06-10 ENCOUNTER — Other Ambulatory Visit: Payer: Self-pay | Admitting: Internal Medicine

## 2023-07-12 ENCOUNTER — Other Ambulatory Visit: Payer: Self-pay | Admitting: Hematology and Oncology

## 2023-07-13 ENCOUNTER — Other Ambulatory Visit: Payer: Self-pay | Admitting: Nurse Practitioner

## 2023-07-13 DIAGNOSIS — K219 Gastro-esophageal reflux disease without esophagitis: Secondary | ICD-10-CM

## 2023-07-24 ENCOUNTER — Encounter: Payer: Self-pay | Admitting: Internal Medicine

## 2023-07-24 ENCOUNTER — Ambulatory Visit: Admitting: Internal Medicine

## 2023-08-23 ENCOUNTER — Other Ambulatory Visit: Payer: Self-pay | Admitting: Internal Medicine

## 2023-09-01 ENCOUNTER — Other Ambulatory Visit: Payer: Self-pay | Admitting: Nurse Practitioner

## 2023-10-10 ENCOUNTER — Ambulatory Visit: Payer: Self-pay | Admitting: Nurse Practitioner

## 2023-10-14 ENCOUNTER — Other Ambulatory Visit: Payer: Self-pay | Admitting: Hematology and Oncology

## 2023-10-16 ENCOUNTER — Other Ambulatory Visit: Payer: Self-pay | Admitting: Hematology and Oncology

## 2023-10-16 DIAGNOSIS — Z1231 Encounter for screening mammogram for malignant neoplasm of breast: Secondary | ICD-10-CM

## 2023-11-04 ENCOUNTER — Ambulatory Visit

## 2023-11-10 ENCOUNTER — Other Ambulatory Visit: Payer: Self-pay | Admitting: Hematology and Oncology

## 2023-11-20 ENCOUNTER — Ambulatory Visit

## 2023-11-21 NOTE — Progress Notes (Signed)
 Elizabeth Chase                                          MRN: [id number]   11/21/2023   The [facility] Quality Team Specialist reviewed this patient medical record for the purposes of chart review for care gap closure. The following were reviewed: chart review for care gap closure-controlling blood pressure.    [facility] Quality Team

## 2023-11-21 NOTE — Telephone Encounter (Unsigned)
 Copied from CRM [id number]. Topic: Clinical - Order For Equipment >> Nov 21, 2023  8:50 AM Amy B wrote: Reason for CRM: [facility] faxed an order for incontinent supplies on 10/02.  It is in the media tab.  They request the form to be filled and signed and faxed back to them as soon as possible.

## 2023-12-03 DIAGNOSIS — R32 Unspecified urinary incontinence: Secondary | ICD-10-CM | POA: Diagnosis not present

## 2023-12-09 ENCOUNTER — Ambulatory Visit (HOSPITAL_COMMUNITY)
Admission: RE | Admit: 2023-12-09 | Discharge: 2023-12-09 | Disposition: A | Discharge status: Home / Self Care | Source: Ambulatory Visit | Attending: Nurse Practitioner | Admitting: Nurse Practitioner

## 2023-12-09 ENCOUNTER — Ambulatory Visit (INDEPENDENT_AMBULATORY_CARE_PROVIDER_SITE_OTHER): Payer: Self-pay | Admitting: Nurse Practitioner

## 2023-12-09 ENCOUNTER — Encounter: Payer: Self-pay | Admitting: Nurse Practitioner

## 2023-12-09 VITALS — BP 142/66 | HR 74 | Wt 130.0 lb

## 2023-12-09 DIAGNOSIS — M25562 Pain in left knee: Secondary | ICD-10-CM | POA: Diagnosis present

## 2023-12-09 MED ORDER — PREDNISONE 20 MG PO TABS: 20.0000 mg | ORAL_TABLET | Freq: Every day | ORAL | 0 refills | Status: AC

## 2023-12-09 NOTE — Progress Notes (Signed)
 Subjective   Patient ID: Elizabeth Chase, female    DOB: 14-Jun-1940, 83 y.o.   MRN: [id number]  Chief Complaint  Patient presents with   Leg Pain    For a week     Referring provider: Oley Elizabeth RAMAN, NP  Elizabeth Chase is a 83 y.o. female with Past Medical History: No date: AAA (abdominal aortic aneurysm) No date: Abnormal brain MRI No date: Abnormal glucose level 04/15/2020: AKI (acute kidney injury) No date: Anemia No date: Blood pressure abnormally low 05/13/2020: Breast wound 04/15/2020: Colitis 04/15/2020: COVID-19 virus infection No date: Essential hypertension 05/13/2020: Homelessness No date: Hyperlipidemia No date: Hypertension No date: Hypocalcemia No date: Malignant neoplasm of right breast in female, estrogen  receptor positive (HCC) No date: Muscle weakness No date: Neoplasm of breast, female, malignant (HCC) 05/13/2020: Port-A-Cath in place 04/15/2020: Prolonged QT interval 01/01/2017: Right thalamic infarction [facility]) No date: Stroke (HCC) 04/15/2020: Syncope and collapse No date: Systolic murmur  HPI:  Knee Pain  The incident occurred more than 1 week ago. There was no injury mechanism. The pain is present in the left knee. The pain is moderate. The pain has been Worsening since onset. The symptoms are aggravated by movement and weight bearing. She has tried acetaminophen  for the symptoms. The treatment provided mild relief.    Allergies  Allergen Reactions   Bee Venom     unknown    Immunization History  Administered Date(s) Administered   Fluad Quad(high Dose 65+) 12/15/2020, 10/04/2021   Fluad Trivalent(High Dose 65+) 12/10/2022   PNEUMOCOCCAL CONJUGATE-20 10/04/2021   Zoster Recombinant(Shingrix) 10/04/2021    Tobacco History: Social History   Tobacco Use  Smoking Status Former   Current packs/day: 0.00   Average packs/day: 1 pack/day for 50.0 years (50.0 ttl pk-yrs)   Types: Cigarettes   Start date: 01/01/1967   Quit date:  12/31/2016   Years since quitting: 6.9  Smokeless Tobacco Never   Counseling given: Not Answered   Outpatient Encounter Medications as of 12/09/2023  Medication Sig   Acetaminophen  (TYLENOL  8 HOUR PO) Take by mouth.   albuterol  (PROVENTIL ) (2.5 MG/3ML) 0.083% nebulizer solution Take 1.5 mLs (1.25 mg total) by nebulization every 6 (six) hours as needed for wheezing or shortness of breath.   aspirin  EC 81 MG tablet Take 1 tablet (81 mg total) by mouth daily at 6 (six) AM. Swallow whole.   atorvastatin  (LIPITOR ) 20 MG tablet TAKE 1 TABLET BY MOUTH DAILY AT 6 PM.   cetirizine  (ZYRTEC ) 10 MG tablet Take 1 tablet (10 mg total) by mouth daily.   cholecalciferol (VITAMIN D3) 25 MCG (1000 UNIT) tablet Take 1 tablet (1,000 Units total) by mouth daily.   feeding supplement (BOOST HIGH PROTEIN) LIQD Take 237 mLs by mouth 3 (three) times daily between meals. Please give her 90 bottles   letrozole  (FEMARA ) 2.5 MG tablet TAKE 1 TABLET BY MOUTH EVERY DAY   lisinopril  (ZESTRIL ) 10 MG tablet TAKE 1 TABLET BY MOUTH EVERY DAY   metFORMIN (GLUCOPHAGE) 1000 MG tablet 500 mg.   montelukast  (SINGULAIR ) 10 MG tablet TAKE 1 TABLET BY MOUTH EVERYDAY AT BEDTIME   nystatin  (MYCOSTATIN ) 100000 UNIT/ML suspension Take 5 mLs (500,000 Units total) by mouth 4 (four) times daily.   omeprazole  (PRILOSEC) 20 MG capsule TAKE 1 CAPSULE BY MOUTH EVERY DAY   Tiotropium Bromide-Olodaterol (STIOLTO RESPIMAT ) 2.5-2.5 MCG/ACT AERS INHALE 2 PUFFS INTO THE LUNGS DAILY.   trazodone  (DESYREL ) 300 MG tablet TAKE 1 TABLET AT BEDTIME  vitamin B-12 (CYANOCOBALAMIN ) 500 MCG tablet Take 2 tablets (1,000 mcg total) by mouth daily.   olopatadine  (PATADAY ) 0.1 % ophthalmic solution Place 1 drop into both eyes 2 (two) times daily. (Patient not taking: Reported on 12/09/2023)   predniSONE  (DELTASONE ) 20 MG tablet Take 1 tablet (20 mg total) by mouth daily with breakfast.   No facility-administered encounter medications on file as of 12/09/2023.     Review of Systems  Review of Systems  Constitutional: Negative.   HENT: Negative.    Cardiovascular: Negative.   Gastrointestinal: Negative.   Allergic/Immunologic: Negative.   Neurological: Negative.   Psychiatric/Behavioral: Negative.       Objective:   BP (!) 159/66   Pulse 74   Wt 130 lb (59 kg)   SpO2 96%   BMI 23.03 kg/m   Wt Readings from Last 5 Encounters:  12/09/23 130 lb (59 kg)  04/17/23 134 lb 8 oz (61 kg)  04/11/23 135 lb (61.2 kg)  12/10/22 133 lb (60.3 kg)  11/29/22 134 lb 14.4 oz (61.2 kg)     Physical Exam Vitals and nursing note reviewed.  Constitutional:      General: She is not in acute distress.    Appearance: She is well-developed.  Cardiovascular:     Rate and Rhythm: Normal rate and regular rhythm.  Pulmonary:     Effort: Pulmonary effort is normal.     Breath sounds: Normal breath sounds.  Musculoskeletal:     Left knee: Decreased range of motion. Tenderness present.  Neurological:     Mental Status: She is alert and oriented to person, place, and time.       Assessment & Plan:   Acute pain of left knee -     DG Knee Complete 4 Views Left -     predniSONE ; Take 1 tablet (20 mg total) by mouth daily with breakfast.  Dispense: 5 tablet; Refill: 0 -     Ambulatory referral to Orthopedic Surgery     Return if symptoms worsen or fail to improve.   Elizabeth GORMAN Borer, NP 12/09/2023

## 2023-12-11 ENCOUNTER — Ambulatory Visit: Payer: Self-pay | Admitting: Nurse Practitioner

## 2023-12-11 ENCOUNTER — Ambulatory Visit: Payer: Self-pay

## 2023-12-11 NOTE — Telephone Encounter (Signed)
 FYI Only or Action Required?: Action required by provider: lab or test result follow-up needed.  Patient was last seen in primary care on 12/09/2023 by Oley Bascom RAMAN, NP.  Called Nurse Triage reporting Results.  Symptoms began a week ago.  Interventions attempted: OTC medications: tylenol .  Symptoms are: gradually worsening.  Triage Disposition: Call PCP Within 24 Hours  Patient/caregiver understands and will follow disposition?: No, wishes to speak with PCP   Copied from CRM #[id number]. Topic: Clinical - Red Word Triage >> Dec 11, 2023  2:22 PM Roselie BROCKS wrote: Red Word that prompted transfer to Nurse Triage: Patient is needing her xray  results giving to her. Reason for Disposition  [1] Caller requests to speak ONLY to PCP AND [2] NON-URGENT question  Answer Assessment - Initial Assessment Questions Additional info:  Patient daughter Consuelo is calling in to inquire on xray results. Read message by provider but they have additional questions. Also concerned for bone cancer. Please follow up with patient and daughter Consuelo for additional imaging result questions.     1. REASON FOR CALL or QUESTION: What is your reason for calling today? or How can I best  Xray results.  Protocols used: PCP Call - No Triage-A-AH

## 2023-12-12 NOTE — Telephone Encounter (Signed)
 Office located for the referral to ortho. Kh

## 2023-12-17 ENCOUNTER — Telehealth: Payer: Self-pay | Admitting: Nurse Practitioner

## 2023-12-17 NOTE — Telephone Encounter (Signed)
 Copied from CRM [id number]. Topic: Referral - Status >> Dec 13, 2023  9:54 AM Kevelyn M wrote: Reason for CRM: Patient's daughter calling in for orthopedic surgery referral. She saying the surgeon should have her referral already. I advised the caller that it could take up to 5-7 business days for a referral to process.

## 2023-12-23 DIAGNOSIS — M1711 Unilateral primary osteoarthritis, right knee: Secondary | ICD-10-CM | POA: Diagnosis not present

## 2023-12-30 DIAGNOSIS — M25562 Pain in left knee: Secondary | ICD-10-CM | POA: Diagnosis not present

## 2023-12-30 DIAGNOSIS — M1712 Unilateral primary osteoarthritis, left knee: Secondary | ICD-10-CM | POA: Diagnosis not present

## 2024-01-02 ENCOUNTER — Other Ambulatory Visit: Payer: Self-pay | Admitting: Nurse Practitioner

## 2024-01-02 DIAGNOSIS — K219 Gastro-esophageal reflux disease without esophagitis: Secondary | ICD-10-CM

## 2024-01-03 ENCOUNTER — Other Ambulatory Visit: Payer: Self-pay | Admitting: Nurse Practitioner

## 2024-01-03 NOTE — Telephone Encounter (Unsigned)
 Copied from CRM [id number]. Topic: Clinical - Medication Refill >> Jan 03, 2024 11:48 AM Travis F wrote: Medication: Tiotropium Bromide-Olodaterol (STIOLTO RESPIMAT ) 2.5-2.5 MCG/ACT AERS [[id number]]  Has the patient contacted their pharmacy? Yes  (Agent: If yes, when and what did the pharmacy advise?) contact office   This is the patient's preferred pharmacy:  [id number] - [place], [place] - [address], [address] AT [facility] [address], [address] [place] [address] Phone: [phone] Fax: [phone]  Is this the correct pharmacy for this prescription? Yes If no, delete pharmacy and type the correct one.   Has the prescription been filled recently? Yes  Is the patient out of the medication? Yes  Has the patient been seen for an appointment in the last year OR does the patient have an upcoming appointment? Yes  Can we respond through MyChart? Yes  Agent: Please be advised that Rx refills may take up to 3 business days. We ask that you follow-up with your pharmacy.

## 2024-01-30 ENCOUNTER — Other Ambulatory Visit: Payer: Self-pay

## 2024-03-12 ENCOUNTER — Other Ambulatory Visit: Payer: Self-pay | Admitting: Nurse Practitioner

## 2024-03-12 NOTE — Telephone Encounter (Signed)
 montelukast  (SINGULAIR ) 10 MG tablet [Pharmacy Med Name: MONTELUKAST  SOD 10 MG TABLET]

## 2024-04-16 ENCOUNTER — Inpatient Hospital Stay: Admitting: Hematology and Oncology
